# Patient Record
Sex: Female | Born: 1940 | Race: White | Marital: Single | Smoking: Never smoker
Health system: Southern US, Community
[De-identification: ages and names within clinical notes are randomized; demographics above are authoritative.]

## PROBLEM LIST (undated history)

## (undated) DIAGNOSIS — I890 Lymphedema, not elsewhere classified: Secondary | ICD-10-CM

## (undated) DIAGNOSIS — H269 Unspecified cataract: Secondary | ICD-10-CM

## (undated) DIAGNOSIS — Z803 Family history of malignant neoplasm of breast: Secondary | ICD-10-CM

## (undated) DIAGNOSIS — M1990 Unspecified osteoarthritis, unspecified site: Secondary | ICD-10-CM

## (undated) DIAGNOSIS — I10 Essential (primary) hypertension: Secondary | ICD-10-CM

## (undated) DIAGNOSIS — Z9221 Personal history of antineoplastic chemotherapy: Secondary | ICD-10-CM

## (undated) DIAGNOSIS — K219 Gastro-esophageal reflux disease without esophagitis: Secondary | ICD-10-CM

## (undated) DIAGNOSIS — K5792 Diverticulitis of intestine, part unspecified, without perforation or abscess without bleeding: Secondary | ICD-10-CM

## (undated) DIAGNOSIS — E785 Hyperlipidemia, unspecified: Secondary | ICD-10-CM

## (undated) DIAGNOSIS — C50919 Malignant neoplasm of unspecified site of unspecified female breast: Secondary | ICD-10-CM

## (undated) DIAGNOSIS — R7303 Prediabetes: Secondary | ICD-10-CM

## (undated) DIAGNOSIS — Z923 Personal history of irradiation: Secondary | ICD-10-CM

## (undated) HISTORY — PX: SPINE SURGERY: SHX786

## (undated) HISTORY — DX: Essential (primary) hypertension: I10

## (undated) HISTORY — DX: Gastro-esophageal reflux disease without esophagitis: K21.9

## (undated) HISTORY — PX: JOINT REPLACEMENT: SHX530

## (undated) HISTORY — PX: CHOLECYSTECTOMY: SHX55

## (undated) HISTORY — DX: Diverticulitis of intestine, part unspecified, without perforation or abscess without bleeding: K57.92

## (undated) HISTORY — DX: Hyperlipidemia, unspecified: E78.5

## (undated) HISTORY — PX: MASTECTOMY: SHX3

## (undated) HISTORY — DX: Family history of malignant neoplasm of breast: Z80.3

## (undated) HISTORY — DX: Unspecified osteoarthritis, unspecified site: M19.90

## (undated) HISTORY — DX: Unspecified cataract: H26.9

## (undated) HISTORY — DX: Malignant neoplasm of unspecified site of unspecified female breast: C50.919

## (undated) HISTORY — PX: OTHER SURGICAL HISTORY: SHX169

---

## 1995-04-25 HISTORY — PX: GALLBLADDER SURGERY: SHX652

## 2010-04-19 ENCOUNTER — Ambulatory Visit: Payer: Self-pay | Admitting: Specialist

## 2010-05-05 ENCOUNTER — Inpatient Hospital Stay: Payer: Self-pay | Admitting: Specialist

## 2010-05-05 HISTORY — PX: TOTAL KNEE ARTHROPLASTY: SHX125

## 2010-05-11 ENCOUNTER — Encounter: Payer: Self-pay | Admitting: Internal Medicine

## 2010-05-25 ENCOUNTER — Encounter: Payer: Self-pay | Admitting: Internal Medicine

## 2011-05-02 ENCOUNTER — Ambulatory Visit: Payer: Self-pay | Admitting: Family Medicine

## 2011-11-17 ENCOUNTER — Ambulatory Visit: Payer: Self-pay | Admitting: Family Medicine

## 2012-04-12 ENCOUNTER — Ambulatory Visit: Payer: Self-pay | Admitting: Family Medicine

## 2012-06-21 ENCOUNTER — Ambulatory Visit: Payer: Self-pay | Admitting: Internal Medicine

## 2012-09-26 ENCOUNTER — Ambulatory Visit: Payer: Self-pay | Admitting: Orthopedic Surgery

## 2012-10-21 ENCOUNTER — Ambulatory Visit: Payer: Self-pay | Admitting: Orthopedic Surgery

## 2012-10-21 DIAGNOSIS — I10 Essential (primary) hypertension: Secondary | ICD-10-CM

## 2012-10-21 LAB — BASIC METABOLIC PANEL
Anion Gap: 5 — ABNORMAL LOW (ref 7–16)
BUN: 7 mg/dL (ref 7–18)
Co2: 31 mmol/L (ref 21–32)
Creatinine: 0.63 mg/dL (ref 0.60–1.30)
EGFR (Non-African Amer.): >60
Osmolality: 281 (ref 275–301)
Potassium: 4 mmol/L (ref 3.5–5.1)
Sodium: 142 mmol/L (ref 136–145)

## 2012-10-21 LAB — URINALYSIS, COMPLETE
Hyaline Cast: 8
Nitrite: NEGATIVE
Protein: NEGATIVE
RBC,UR: 1 /HPF (ref 0–5)
Squamous Epithelial: 12

## 2012-10-21 LAB — CBC
HCT: 41.2 % (ref 35.0–47.0)
HGB: 14 g/dL (ref 12.0–16.0)
MCHC: 33.9 g/dL (ref 32.0–36.0)
MCV: 87 fL (ref 80–100)
Platelet: 257 10*3/uL (ref 150–440)
RBC: 4.76 10*6/uL (ref 3.80–5.20)
RDW: 14.9 % — ABNORMAL HIGH (ref 11.5–14.5)
WBC: 7.9 10*3/uL (ref 3.6–11.0)

## 2012-10-21 LAB — PROTIME-INR
INR: 1
Prothrombin Time: 12.9 secs (ref 11.5–14.7)

## 2012-10-21 LAB — MRSA PCR SCREENING

## 2012-11-05 ENCOUNTER — Inpatient Hospital Stay: Payer: Self-pay | Admitting: Orthopedic Surgery

## 2012-11-06 LAB — BASIC METABOLIC PANEL
Anion Gap: 4 — ABNORMAL LOW (ref 7–16)
BUN: 3 mg/dL — ABNORMAL LOW (ref 7–18)
Co2: 30 mmol/L (ref 21–32)
EGFR (African American): >60
EGFR (Non-African Amer.): >60
Glucose: 121 mg/dL — ABNORMAL HIGH (ref 65–99)
Potassium: 3.1 mmol/L — ABNORMAL LOW (ref 3.5–5.1)
Sodium: 138 mmol/L (ref 136–145)

## 2012-11-06 LAB — PLATELET COUNT: Platelet: 208 10*3/uL (ref 150–440)

## 2012-11-07 LAB — BASIC METABOLIC PANEL
Anion Gap: 6 — ABNORMAL LOW (ref 7–16)
Calcium, Total: 8.5 mg/dL (ref 8.5–10.1)
Co2: 29 mmol/L (ref 21–32)
Creatinine: 0.58 mg/dL — ABNORMAL LOW (ref 0.60–1.30)
EGFR (African American): >60
EGFR (Non-African Amer.): >60
Glucose: 102 mg/dL — ABNORMAL HIGH (ref 65–99)
Osmolality: 276 (ref 275–301)

## 2012-11-07 LAB — PATHOLOGY REPORT

## 2012-11-08 ENCOUNTER — Encounter: Payer: Self-pay | Admitting: Internal Medicine

## 2012-11-08 LAB — BASIC METABOLIC PANEL
Anion Gap: 7 (ref 7–16)
Chloride: 103 mmol/L (ref 98–107)
Creatinine: 0.6 mg/dL (ref 0.60–1.30)
EGFR (Non-African Amer.): >60
Glucose: 97 mg/dL (ref 65–99)
Osmolality: 273 (ref 275–301)
Potassium: 3.3 mmol/L — ABNORMAL LOW (ref 3.5–5.1)

## 2012-11-09 LAB — URINALYSIS, COMPLETE
Bacteria: NONE SEEN
Bilirubin,UR: NEGATIVE
Glucose,UR: NEGATIVE mg/dL (ref 0–75)
Leukocyte Esterase: NEGATIVE
Nitrite: NEGATIVE
Protein: NEGATIVE
RBC,UR: <1 /HPF (ref 0–5)

## 2012-11-10 LAB — URINE CULTURE

## 2012-11-22 ENCOUNTER — Encounter: Payer: Self-pay | Admitting: Internal Medicine

## 2013-04-28 LAB — HM MAMMOGRAPHY: HM Mammogram: NORMAL

## 2013-05-29 ENCOUNTER — Ambulatory Visit: Payer: Self-pay | Admitting: Family Medicine

## 2014-05-12 DIAGNOSIS — E785 Hyperlipidemia, unspecified: Secondary | ICD-10-CM | POA: Diagnosis not present

## 2014-05-12 DIAGNOSIS — R3915 Urgency of urination: Secondary | ICD-10-CM | POA: Diagnosis not present

## 2014-05-12 DIAGNOSIS — K219 Gastro-esophageal reflux disease without esophagitis: Secondary | ICD-10-CM | POA: Diagnosis not present

## 2014-06-18 DIAGNOSIS — R05 Cough: Secondary | ICD-10-CM | POA: Diagnosis not present

## 2014-06-23 ENCOUNTER — Ambulatory Visit: Payer: Self-pay | Admitting: Family Medicine

## 2014-06-23 DIAGNOSIS — R05 Cough: Secondary | ICD-10-CM | POA: Diagnosis not present

## 2014-06-30 DIAGNOSIS — E785 Hyperlipidemia, unspecified: Secondary | ICD-10-CM | POA: Diagnosis not present

## 2014-06-30 DIAGNOSIS — M25511 Pain in right shoulder: Secondary | ICD-10-CM | POA: Diagnosis not present

## 2014-06-30 DIAGNOSIS — R600 Localized edema: Secondary | ICD-10-CM | POA: Diagnosis not present

## 2014-07-15 DIAGNOSIS — G8929 Other chronic pain: Secondary | ICD-10-CM | POA: Diagnosis not present

## 2014-07-15 DIAGNOSIS — M25511 Pain in right shoulder: Secondary | ICD-10-CM | POA: Diagnosis not present

## 2014-07-20 DIAGNOSIS — M1990 Unspecified osteoarthritis, unspecified site: Secondary | ICD-10-CM | POA: Diagnosis not present

## 2014-08-14 NOTE — Discharge Summary (Signed)
PATIENT NAME:  Theresa Reid, ASCH MR#:  [id number] DATE OF BIRTH:  1940/06/19  DATE OF ADMISSION:  11/05/2012 DATE OF DISCHARGE:    ADMITTING DIAGNOSIS:  Left knee severe osteoarthritis.  DISCHARGE DIAGNOSIS:  Severe left knee osteoarthritis.   PROCEDURE:  Left total knee replacement.   SURGEON:  Laurene Footman, M.D.   ASSISTANT:  Rachelle Hora, PA-C.   ANESTHESIA:  Spinal.   TOURNIQUET TIME:  90 minutes at 300 mmHg.   ESTIMATED BLOOD LOSS:  100 mL.    IMPLANTS:  Medacta GMK primary total knee system, three narrow left femoral components, PS, a left fixed tibial component, a size 2, and a size 2, 17 mm PS tibial insert and a size 2 patella.  All components were cemented with a gentamicin simplex screw.   HISTORY OF PRESENT ILLNESS:  The patient is a 74 year old female referred by Sammuel Bailiff, M.D.  She has significant osteoarthritis of the left knee.  She has had nonoperative treatment that has failed.  She has pain at rest as well as pain with activity.  She comes back after [facility] to discuss total knee replacement.   PHYSICAL EXAMINATION: LUNGS:  Clear.  HEART:  Regular rate and rhythm.  HEENT:  Normal.  MUSCULOSKELETAL:  On exam she has 25 to 100 degrees range of motion with slight deformity that is passively correctable.  Distally she has trace dorsalis pedis and posterior tibialis pulses.  Sensation and toe and ankle range of motion is intact.    HOSPITAL COURSE:  The patient was admitted to the hospital on 11/05/2012.  She had surgery that same day and was brought to the orthopedic floor from the PACU in stable condition.  On hospital day 1, the patient was found to have a postop blood loss anemia and hypokalemia of 3.1.  The patient was given Klor-Con 20 mEq tablet 1 tab by mouth twice daily for the next 48 hours.  Her vital signs were stable.  She had slow progress with physical therapy.  On postop day 2, the patient's potassium was up to 3.3.  We continued with Klor-Con  tablets.  On postop day 3, the patient continued to have slow progress with physical therapy.  Her potassium level was stable.  Vital signs were stable.  She was medically stable and ready for discharge to rehab facility.   CONDITION AT DISCHARGE:  Stable.   DISCHARGE INSTRUCTIONS:  The patient may gradually increase weight-bearing on the affected extremity.  She can resume a regular diet as tolerated.  She needs to continue with the polar care unit between 40 and 50 degrees.  Do not get the dressing or bandage wet or dirty.  Call [facility] if the dressing gets water under it, leave the dressing off.  Call [facility] if either of the following occur, bright red bleeding from the incision or wound, fever above 101.5 degrees, redness, swelling or drainage at the incision.  Call [facility] if you experience any increased worsening numbness or weakness in your legs or bowel or bladder symptoms.  Physical therapy has been set up for a rehab facility.  She needs to contact [facility] in two weeks for an appointment.   DISCHARGE MEDICATIONS:  Amlodipine 5 mg delayed tablet one tablet orally once a day, fish orally 1,000 mg oral capsule one capsule orally once a day, misoprostol 1,000 mcg tablet 1 tablet orally 2 times a day, omeprazole 20 mg 1 tablet orally once a day, torsemide  5 mg 1 tablet orally once a day, potassium chloride 10 mEq 1 capsule orally once a day extended release, calcium and vitamin D one tablet orally once a day, simvastatin 40 mg 1 tablet orally once a day, tramadol 50 mg on 1 tablet orally every 6 hours as needed for pain, oxybutynin 5 mg 1 tablet orally 3 times a day, Tylenol 500 mg 1 tablet orally every 4 hours as needed for pain or temperature greater than 100.4, oxycodone 5 mg 1 or 2 tablets orally every 4 hours as needed for pain,  magnesium hydroxide 8% oral suspension 30 mL orally x2 as needed for constipation, Mylanta 30 mL orally every 6 hours as needed for indigestion or heartburn, Xarelto 10  mg oral tablet 1 tablet orally once a day in the morning x 9 days, aspirin 81 mg oral delayed release tablet 1 tablet orally once a day, diphenhydramine 50 mg 1 tablet orally once a day, bisacodyl 10 mg rectal suppository 1 suppository rectal once a day as needed for constipation.    ____________________________ T. Rachelle Hora, PA-C tcg:ea D: 11/07/2012 21:45:00 ET T: 11/07/2012 22:58:52 ET JOB#: [id number]  cc: T. Rachelle Hora, PA-C, <Dictator> Duanne Guess [place] ELECTRONICALLY SIGNED 11/25/2012 12:29

## 2014-08-14 NOTE — H&P (Signed)
PATIENT NAME:  Theresa Reid, Theresa Reid MR#:  [id number] DATE OF BIRTH:  05/13/1940  DATE OF ADMISSION:  11/05/2012  CHIEF COMPLAINT: Left knee pain.   HISTORY OF PRESENT ILLNESS: The patient is a 74 year old patient, who has significant knee osteoarthritis. She has been followed by Dr. Sammuel Bailiff and has exhausted nonoperative treatments, including corticosteroid injection, therapy, bracing and injections as well as maximizing medical treatment with anti-inflammatory and pain pills. She has had significant pain at rest and pain with any activity, and she is now being admitted for elective left total knee replacement.   MEDICATIONS:  Oxybutynin 5 mg p.o. t.i.d., torsemide 5 mg daily, amlodipine 5 mg daily, omeprazole 20 mg daily, diclofenac 75 b.i.d. with food as needed for pain, atorvastatin 40 mg at night, KCl 10 mEq daily, vitamin D 50,000 units weekly, fluticasone spray 50 mcg 2 sprays to each nostril daily, misoprostol 100 mcg b.i.d., fish oil 1000 mg daily, 81 mg aspirin daily, tramadol 50 mg 1 p.o. q.4 to 6 hours as needed for pain, multivitamin daily.   ALLERGIES:  No known drug allergies.   PAST MEDICAL HISTORY:  Remarkable for chickenpox, hypertension, arthritis, high cholesterol.   PAST SURGICAL HISTORY:  Include right total knee replacement 2012, cholecystectomy 1997.   SOCIAL HISTORY: The patient is a nondrinker, nonsmoker, divorced, retired.   FAMILY HISTORY: Positive for diabetes in her father.  REVIEW OF SYSTEMS: Negative for shortness of breath, chest or other significant complaints.   PHYSICAL EXAMINATION:  GENERAL:  Height 5 feet 4 inches, weight 208. HEENT: Normal except for the patient wearing glasses.  LUNGS: Clear.  HEART: Regular rate and rhythm.  EXTREMITIES:  Left lower extremity, she has trace dorsalis pedis and posterior tib pulses. Sensation and range of motion is normal. Left knee range of motion is 25 to 100 degrees with varus deformity that is passively  correctable.   X-rays show severe tricompartmental osteoarthritis.   CLINICAL IMPRESSION: Severe left knee osteoarthritis.   PLAN: Total knee replacement today.  Risks, benefits and possible complications have previously been discussed.     ____________________________ Laurene Footman, MD mjm:dp D: 11/05/2012 10:19:00 ET T: 11/05/2012 11:57:56 ET JOB#: [id number]  cc: Laurene Footman, MD, <Dictator> Laurene Footman MD ELECTRONICALLY SIGNED 11/05/2012 14:21

## 2014-08-14 NOTE — Op Note (Signed)
PATIENT NAME:  Theresa Reid, Theresa Reid MR#:  [id number] DATE OF BIRTH:  January 20, 1941  DATE OF PROCEDURE:  11/05/2012  PREOPERATIVE DIAGNOSIS: Severe left knee osteoarthritis.   POSTOPERATIVE DIAGNOSIS: Severe left knee osteoarthritis.   PROCEDURE: Left total knee replacement.   SURGEON: Laurene Footman, M.D.   ASSISTANT: Rachelle Hora, PA-C.   ANESTHESIA: Spinal.   DESCRIPTION OF PROCEDURE: The patient was brought to the operating room and after adequate anesthesia was obtained, the left leg was prepped and draped in the usual sterile fashion with a tourniquet applied to the upper thigh and the Alvarado leg holder utilized. After appropriate patient identification and timeout procedures were completed, the leg was exsanguinated with an Esmarch and the tourniquet raised to 300 mmHg. A midline skin incision was made after patient identification, timeout procedures, followed by a medial parapatellar arthrotomy. The knee showed extensive wear in all compartments, particularly laterally with bone loss in both the femoral condyles and severe patellofemoral arthritis. The ACL and fat pad were excised. The proximal tibia was exposed to allow for the application of the Woods cutting guide. The cutting guide was applied and the proximal tibia cut carried out excising the PCL at the same time. Next, I had the Medacta knee block applied after removing cartilage in the appropriate areas and the distal femoral cut carried out with a 3 cutting block applied. Anterior, posterior, and chamfer cuts were made releasing soft tissues posteriorly, because of severe flexion contracture. Trials were placed with a 2 tibia and 3 femur. The lateral side was quite tight and partial release of the lateral collateral ligament was carried out to allow for symmetry on the lateral side to compare to the medial side. With a 17 insert, there was good stability and still full extension. The trochlear cut and drill holes were made in the distal  femur along with the PS drill for the Medacta PS insert. The patella was cut using the guide and measured a size 2 with 3 drill holes made. At this point, the tourniquet was let down. After all trials had been removed and hemostasis checked with electrocautery, the knee was then infiltrated with Exparel around the joint along with a 0.5% Sensorcaine to aid in postoperative analgesia immediately. Tourniquet was raised again and the knee again irrigated. Bony surfaces dried. The tibial component was cemented in place first, followed by the 17 mm PS tibial component, which also had a set screw, which was placed. The femoral component was then impacted into place with the knee held in extension while the patellar button was clamped into place with antibiotic cement being utilized. After the cement had set and excess cement had been removed, the knee was thoroughly irrigated. The arthrotomy was repaired using #1 Ethibond and a heavy quill, 2-0 quill subcutaneously and skin staples. A wound VAC was applied. Range of motion was 0 to 110 degrees with good stability.   ESTIMATED BLOOD LOSS: 100 mL.  TOURNIQUET TIME: 90 minutes at 300 mmHg.  IMPLANTS: Medacta GMK primary total knee system, 3 narrow left femoral components PS, a left fixed tibial component, size 2,  a size 2, 17 mm PS tibial insert and a size 2 patella. All components were cemented with gentamicin simplex.   SPECIMENS: Cut ends of bone.   COMPLICATIONS: None.   ____________________________ Laurene Footman, MD mjm:aw D: 11/05/2012 13:28:03 ET T: 11/05/2012 14:15:12 ET JOB#: [id number]  cc: Laurene Footman, MD, <Dictator> Laurene Footman MD ELECTRONICALLY SIGNED 11/05/2012 17:48

## 2014-08-20 DIAGNOSIS — Z1389 Encounter for screening for other disorder: Secondary | ICD-10-CM | POA: Diagnosis not present

## 2014-08-20 DIAGNOSIS — Z0000 Encounter for general adult medical examination without abnormal findings: Secondary | ICD-10-CM | POA: Diagnosis not present

## 2014-08-20 DIAGNOSIS — Z9181 History of falling: Secondary | ICD-10-CM | POA: Diagnosis not present

## 2014-08-20 DIAGNOSIS — Z780 Asymptomatic menopausal state: Secondary | ICD-10-CM | POA: Diagnosis not present

## 2014-09-09 DIAGNOSIS — M1990 Unspecified osteoarthritis, unspecified site: Secondary | ICD-10-CM | POA: Diagnosis not present

## 2014-09-09 DIAGNOSIS — I10 Essential (primary) hypertension: Secondary | ICD-10-CM | POA: Diagnosis not present

## 2014-11-09 ENCOUNTER — Ambulatory Visit: Payer: Medicare Other | Admitting: Family Medicine

## 2014-11-10 ENCOUNTER — Ambulatory Visit: Payer: Medicare Other | Admitting: Family Medicine

## 2014-11-13 ENCOUNTER — Encounter: Payer: Self-pay | Admitting: Family Medicine

## 2014-11-13 ENCOUNTER — Ambulatory Visit (INDEPENDENT_AMBULATORY_CARE_PROVIDER_SITE_OTHER): Payer: Medicare Other | Admitting: Family Medicine

## 2014-11-13 VITALS — BP 138/74 | HR 106 | Temp 98.2°F | Resp 18 | Ht 65.0 in | Wt 205.1 lb

## 2014-11-13 DIAGNOSIS — M159 Polyosteoarthritis, unspecified: Secondary | ICD-10-CM | POA: Insufficient documentation

## 2014-11-13 DIAGNOSIS — I10 Essential (primary) hypertension: Secondary | ICD-10-CM | POA: Insufficient documentation

## 2014-11-13 DIAGNOSIS — M150 Primary generalized (osteo)arthritis: Secondary | ICD-10-CM

## 2014-11-13 DIAGNOSIS — R600 Localized edema: Secondary | ICD-10-CM

## 2014-11-13 DIAGNOSIS — E785 Hyperlipidemia, unspecified: Secondary | ICD-10-CM | POA: Diagnosis not present

## 2014-11-13 DIAGNOSIS — T502X5A Adverse effect of carbonic-anhydrase inhibitors, benzothiadiazides and other diuretics, initial encounter: Secondary | ICD-10-CM

## 2014-11-13 DIAGNOSIS — E876 Hypokalemia: Secondary | ICD-10-CM

## 2014-11-13 MED ORDER — POTASSIUM CHLORIDE ER 10 MEQ PO TBCR
10.0000 meq | EXTENDED_RELEASE_TABLET | Freq: Every day | ORAL | Status: DC
Start: 2014-11-13 — End: 2015-02-09

## 2014-11-13 MED ORDER — TORSEMIDE 5 MG PO TABS: 5.0000 mg | ORAL_TABLET | Freq: Every day | ORAL | Status: DC

## 2014-11-13 MED ORDER — ATORVASTATIN CALCIUM 40 MG PO TABS: 40.0000 mg | ORAL_TABLET | Freq: Every day | ORAL | Status: DC

## 2014-11-13 MED ORDER — TRAMADOL HCL 50 MG PO TABS: 50.0000 mg | ORAL_TABLET | Freq: Every day | ORAL | Status: DC | PRN

## 2014-11-13 MED ORDER — TRAMADOL HCL 50 MG PO TABS: 50.0000 mg | ORAL_TABLET | Freq: Four times a day (QID) | ORAL | Status: DC | PRN

## 2014-11-13 NOTE — Progress Notes (Signed)
Name: Theresa Reid   MRN: [id number]    DOB: [birth year]   Date:11/13/2014       Progress Note  Subjective  Chief Complaint  Chief Complaint  Patient presents with  . Follow-up    3 mo. f/u Labs-Fasting  . Medication Refill  . Arthritis  . Hypertension  . Hyperlipidemia    Arthritis Presents for follow-up visit. The disease course has been stable. The condition has lasted for 35 years. She complains of pain, stiffness and joint swelling. Affected locations include the right shoulder, left shoulder, left knee, right knee, left hip and right hip. Her pain is at a severity of 2/10. Associated symptoms include fatigue and pain at night. Pertinent negatives include no dry eyes, dry mouth or fever. Her past medical history is significant for chronic back pain and osteoarthritis. Past treatments include an opioid, NSAIDs, corticosteroids, exercise, activity, heat and cold. The treatment provided moderate relief. Compliance with prior treatments has been good.  Hypertension This is a chronic problem. The problem is controlled. Pertinent negatives include no chest pain, headaches, palpitations or shortness of breath. Past treatments include calcium channel blockers. The current treatment provides significant improvement. There are no compliance problems.  There is no history of angina, CAD/MI or CVA.  Hyperlipidemia This is a chronic problem. The problem is controlled. Recent lipid tests were reviewed and are high. She has no history of diabetes or liver disease. Pertinent negatives include no chest pain, leg pain, myalgias or shortness of breath. Current antihyperlipidemic treatment includes statins. The current treatment provides moderate improvement of lipids. There are no compliance problems.  Risk factors for coronary artery disease include dyslipidemia.      Past Medical History  Diagnosis Date  . Hyperlipidemia   . Hypertension   . GERD (gastroesophageal reflux disease)     Past  Surgical History  Procedure Laterality Date  . Total knee arthroplasty Bilateral 05/05/2010  . Oophorectomy Right   . Cholecystectomy      History reviewed. No pertinent family history.  History   Social History  . Marital Status: Single    Spouse Name: N/A  . Number of Children: N/A  . Years of Education: N/A   Occupational History  . Not on file.   Social History Main Topics  . Smoking status: Never Smoker   . Smokeless tobacco: Never Used  . Alcohol Use: No  . Drug Use: No  . Sexual Activity: Not on file   Other Topics Concern  . Not on file   Social History Narrative  . No narrative on file     Current outpatient prescriptions:  .  amLODipine (NORVASC) 5 MG tablet, , Disp: , Rfl:  .  aspirin 81 MG tablet, Take by mouth., Disp: , Rfl:  .  atorvastatin (LIPITOR) 40 MG tablet, Take by mouth., Disp: , Rfl:  .  CALCIUM-VITAMIN D PO, Take by mouth., Disp: , Rfl:  .  diclofenac (VOLTAREN) 75 MG EC tablet, , Disp: , Rfl:  .  misoprostol (CYTOTEC) 200 MCG tablet, , Disp: , Rfl:  .  Multiple Vitamins tablet, Take by mouth., Disp: , Rfl:  .  Omega-3 Fatty Acids (FISH OIL PO), , Disp: , Rfl:  .  omeprazole (PRILOSEC) 20 MG capsule, Take by mouth., Disp: , Rfl:  .  oxybutynin (DITROPAN) 5 MG tablet, , Disp: , Rfl:  .  potassium chloride (K-DUR) 10 MEQ tablet, Take by mouth., Disp: , Rfl:  .  torsemide (DEMADEX) 5 MG tablet,  Take by mouth., Disp: , Rfl:  .  traMADol (ULTRAM) 50 MG tablet, Take 50 mg by mouth every 6 (six) hours as needed., Disp: , Rfl:   No Known Allergies   Review of Systems  Constitutional: Positive for fatigue. Negative for fever and chills.  Respiratory: Negative for shortness of breath.   Cardiovascular: Negative for chest pain and palpitations.  Gastrointestinal: Negative for heartburn and nausea.  Musculoskeletal: Positive for back pain, joint pain, joint swelling, arthritis and stiffness. Negative for myalgias.  Neurological: Negative for  headaches.      Objective  Filed Vitals:   11/13/14 0843  BP: 138/74  Pulse: 106  Temp: 98.2 F (36.8 C)  TempSrc: Oral  Resp: 18  Height: 5\' 5"  (1.651 m)  Weight: 205 lb 1.6 oz (93.033 kg)  SpO2: 95%    Physical Exam  Constitutional: She is oriented to person, place, and time and well-developed, well-nourished, and in no distress.  HENT:  Head: Normocephalic and atraumatic.  Cardiovascular: Normal rate and regular rhythm.   Pulmonary/Chest: Effort normal and breath sounds normal.  Musculoskeletal:       Back:       Arms: Neurological: She is alert and oriented to person, place, and time.  Nursing note and vitals reviewed.   Assessment & Plan 1. Essential hypertension Blood Pressure is at goal. Continue present therapy - Comprehensive metabolic panel  2. Hyperlipidemia  - atorvastatin (LIPITOR) 40 MG tablet; Take 1 tablet (40 mg total) by mouth daily at 6 PM.  Dispense: 90 tablet; Refill: 0 - Comprehensive metabolic panel - Lipid panel  3. Primary osteoarthritis involving multiple joints Patient takes diclofenac twice a day and tramadol for osteoarthritis involving multiple joints. Prescription for tramadol refilled. - traMADol (ULTRAM) 50 MG tablet; Take 1 tablet (50 mg total) by mouth daily as needed.  Dispense: 30 tablet; Refill: 2  4. Bilateral leg edema  - torsemide (DEMADEX) 5 MG tablet; Take 1 tablet (5 mg total) by mouth daily.  Dispense: 90 tablet; Refill: 0  5. Diuretic-induced hypokalemia  - potassium chloride (K-DUR) 10 MEQ tablet; Take 1 tablet (10 mEq total) by mouth daily.  Dispense: 90 tablet; Refill: 0 - Comprehensive metabolic panel   Nannette Zill Asad A. [facility] 11/13/2014 9:05 AM

## 2014-11-14 LAB — COMPREHENSIVE METABOLIC PANEL
ALT: 21 IU/L (ref 0–32)
AST: 23 IU/L (ref 0–40)
Albumin/Globulin Ratio: 1.5 (ref 1.1–2.5)
Albumin: 4.2 g/dL (ref 3.5–4.8)
Alkaline Phosphatase: 134 IU/L — ABNORMAL HIGH (ref 39–117)
BUN / CREAT RATIO: 13 (ref 11–26)
BUN: 7 mg/dL — ABNORMAL LOW (ref 8–27)
Bilirubin Total: 0.4 mg/dL (ref 0.0–1.2)
CALCIUM: 9.6 mg/dL (ref 8.7–10.3)
CHLORIDE: 100 mmol/L (ref 97–108)
CO2: 25 mmol/L (ref 18–29)
CREATININE: 0.55 mg/dL — AB (ref 0.57–1.00)
GFR calc Af Amer: 106 mL/min/{1.73_m2} (ref >59)
GFR calc non Af Amer: 92 mL/min/{1.73_m2} (ref >59)
GLOBULIN, TOTAL: 2.8 g/dL (ref 1.5–4.5)
Glucose: 116 mg/dL — ABNORMAL HIGH (ref 65–99)
POTASSIUM: 4.8 mmol/L (ref 3.5–5.2)
Sodium: 142 mmol/L (ref 134–144)
Total Protein: 7 g/dL (ref 6.0–8.5)

## 2014-11-14 LAB — LIPID PANEL
CHOLESTEROL TOTAL: 204 mg/dL — AB (ref 100–199)
Chol/HDL Ratio: 3.6 ratio units (ref 0.0–4.4)
HDL: 56 mg/dL (ref >39)
LDL CALC: 106 mg/dL — AB (ref 0–99)
TRIGLYCERIDES: 212 mg/dL — AB (ref 0–149)
VLDL CHOLESTEROL CAL: 42 mg/dL — AB (ref 5–40)

## 2014-11-19 ENCOUNTER — Ambulatory Visit: Payer: Self-pay | Admitting: Family Medicine

## 2014-11-20 ENCOUNTER — Ambulatory Visit: Payer: Self-pay | Admitting: Family Medicine

## 2014-11-20 ENCOUNTER — Ambulatory Visit: Payer: Self-pay | Admitting: Nurse Practitioner

## 2014-11-30 ENCOUNTER — Ambulatory Visit (INDEPENDENT_AMBULATORY_CARE_PROVIDER_SITE_OTHER): Payer: Medicare Other | Admitting: Nurse Practitioner

## 2014-11-30 ENCOUNTER — Encounter: Payer: Self-pay | Admitting: Nurse Practitioner

## 2014-11-30 VITALS — BP 125/82 | HR 75 | Temp 98.8°F | Resp 14 | Ht 65.0 in | Wt 206.0 lb

## 2014-11-30 DIAGNOSIS — M150 Primary generalized (osteo)arthritis: Secondary | ICD-10-CM | POA: Diagnosis not present

## 2014-11-30 DIAGNOSIS — R600 Localized edema: Secondary | ICD-10-CM | POA: Diagnosis not present

## 2014-11-30 DIAGNOSIS — I10 Essential (primary) hypertension: Secondary | ICD-10-CM

## 2014-11-30 DIAGNOSIS — E785 Hyperlipidemia, unspecified: Secondary | ICD-10-CM

## 2014-11-30 DIAGNOSIS — M159 Polyosteoarthritis, unspecified: Secondary | ICD-10-CM

## 2014-11-30 NOTE — Assessment & Plan Note (Signed)
Still apparent at ankles and LE today. Pt reports this happened after knee surgeries. L>R. Discussed elevating legs and using compression stockings.

## 2014-11-30 NOTE — Assessment & Plan Note (Signed)
Will obtain updated lipid panel in Oct. Discussing increasing walking and working on a healthy diet. Discussed cutting down on rice, noodles ect.Marland KitchenMarland Kitchen

## 2014-11-30 NOTE — Assessment & Plan Note (Signed)
Pt wants referral to Rheumatology. Dr. Ernestene Mention specifically. Referred and let pt know it may take a few weeks before we call her. Continue pain management with medications and stretching. Will follow.

## 2014-11-30 NOTE — Patient Instructions (Addendum)
We will obtain your lipid panel in October. Please come fasting (nothing to eat or drink after midnight except water and black coffee).   Welcome to [facility]! Nice to meet you.

## 2014-11-30 NOTE — Progress Notes (Signed)
Patient ID: Theresa Reid, female    DOB: 1940/05/12  Age: 74 y.o. MRN: [id number]  CC: Establish Care   HPI COOPER STAMP presents for establishing care and CC of myalgia x 2 weeks. [facility]   1) New pt info:   Immunizations- Unknown tdap, pna 2015   Mammogram- 2015   Pap- N/A (last 2010 she reports)   Bone Density- 5 years ago in [place]   Colonoscopy- IFOBTs through insurance negative each year she reports, she refuses colonoscopy   Eye Exam- 2014, glasses, will obtain one this year  Dental Exam- UTD  2) Chronic Problems-  HTN/Hyperlipidemia- On medications, stable on these and compliant   Obesity- Pt reports watching meat, reports eating lots of fruits/veggies, reports she eats junk food    Exercise- no formal   OA- 40 years, hands/hips/lower back/feet/shoulders   Right shoulder pain- Saw Dr. Jefm Bryant in March    X-ray taken, right shoulder injected   Has seen Dr. Tamala Julian and Dr. Rudene Christians in the past    Bilateral knee replacement 2012/2014    Tramadol prescribed 11/20/14 by Dr. Manuella Ghazi with    2 refills according to the [facility] (Tramadol is helpful) takes 2-3 x a week at night  3) Acute Problems-  Myalgias- March started on right from, stinging and burning anterior to her antecubital space, R>L PT- helpful for shoulders in the past    History Kadynce has a past medical history of Hyperlipidemia; Hypertension; GERD (gastroesophageal reflux disease); and Arthritis.   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   Her family history includes Alcohol abuse in her mother; Diabetes in her father; Heart disease in her father.She reports that she has never smoked. She has never used smokeless tobacco. She reports that she does not drink alcohol or use illicit drugs.  Outpatient Prescriptions Prior to Visit  Medication Sig Dispense Refill  .  amLODipine (NORVASC) 5 MG tablet     . aspirin 81 MG tablet Take by mouth.    Marland Kitchen atorvastatin (LIPITOR) 40 MG tablet Take 1 tablet (40 mg total) by mouth daily at 6 PM. 90 tablet 0  . CALCIUM-VITAMIN D PO Take by mouth.    . diclofenac (VOLTAREN) 75 MG EC tablet     . misoprostol (CYTOTEC) 200 MCG tablet     . Multiple Vitamins tablet Take by mouth.    . Omega-3 Fatty Acids (FISH OIL PO)     . omeprazole (PRILOSEC) 20 MG capsule Take by mouth.    . oxybutynin (DITROPAN) 5 MG tablet     . potassium chloride (K-DUR) 10 MEQ tablet Take 1 tablet (10 mEq total) by mouth daily. 90 tablet 0  . torsemide (DEMADEX) 5 MG tablet Take 1 tablet (5 mg total) by mouth daily. 90 tablet 0  . traMADol (ULTRAM) 50 MG tablet Take 1 tablet (50 mg total) by mouth daily as needed. 30 tablet 2   No facility-administered medications prior to visit.    ROS Review of Systems  Constitutional: Negative for fever, chills, diaphoresis and fatigue.  Respiratory: Negative for chest tightness, shortness of breath and wheezing.   Cardiovascular: Negative for chest pain, palpitations and leg swelling.  Gastrointestinal: Negative for nausea, vomiting and diarrhea.  Musculoskeletal: Positive for myalgias and arthralgias.  Skin: Negative for rash.  Neurological: Negative for dizziness, weakness, numbness and headaches.  Hematological: Bruises/bleeds easily.  Psychiatric/Behavioral:  Positive for sleep disturbance. Negative for suicidal ideas. The patient is not nervous/anxious.        Trouble falling asleep and staying asleep due to OA pain   Objective:  BP 125/82 mmHg  Pulse 75  Temp(Src) 98.8 F (37.1 C)  Resp 14  Ht 5\' 5"  (1.651 m)  Wt 206 lb (93.441 kg)  BMI 34.28 kg/m2  SpO2 95%  Physical Exam  Constitutional: She is oriented to person, place, and time. She appears well-developed and well-nourished. No distress.  HENT:  Head: Normocephalic and atraumatic.  Right Ear: External ear normal.  Left Ear:  External ear normal.  Cardiovascular: Normal rate, regular rhythm and normal heart sounds.  Exam reveals no gallop and no friction rub.   No murmur heard. Pulmonary/Chest: Effort normal and breath sounds normal. No respiratory distress. She has no wheezes. She has no rales. She exhibits no tenderness.  Musculoskeletal: She exhibits edema and tenderness.  5/5 strength grip bilaterally, deltoids, biceps, illiopsoas, quadriceps and EHL. Decreased ROM in shoulders due to pain. Normal ROM of hips, knees, ankles, wrists.   Neurological: She is alert and oriented to person, place, and time. No cranial nerve deficit. She exhibits normal muscle tone. Coordination normal.  Skin: Skin is warm and dry. No rash noted. She is not diaphoretic.  Psychiatric: She has a normal mood and affect. Her behavior is normal. Judgment and thought content normal.   Assessment & Plan:   Kennia was seen today for establish care.  Diagnoses and all orders for this visit:  Primary osteoarthritis involving multiple joints Orders: -     Ambulatory referral to Rheumatology  Bilateral leg edema  Essential hypertension  Hyperlipidemia   I am having Ms. Kowalczyk maintain her oxybutynin, misoprostol, diclofenac, amLODipine, omeprazole, Multiple Vitamins, Omega-3 Fatty Acids (FISH OIL PO), aspirin, CALCIUM-VITAMIN D PO, atorvastatin, torsemide, potassium chloride, and traMADol.  No orders of the defined types were placed in this encounter.    Follow-up: Return in about 3 months (around 02/15/2015) for HTN/HLD check .

## 2014-11-30 NOTE — Assessment & Plan Note (Signed)
Compliant on medications. Will follow.

## 2014-11-30 NOTE — Progress Notes (Signed)
Pre visit review using our clinic review tool, if applicable. No additional management support is needed unless otherwise documented below in the visit note. 

## 2014-12-14 ENCOUNTER — Ambulatory Visit (INDEPENDENT_AMBULATORY_CARE_PROVIDER_SITE_OTHER): Payer: Medicare Other | Admitting: Nurse Practitioner

## 2014-12-14 VITALS — BP 132/82 | HR 84 | Temp 98.3°F | Resp 14 | Ht 65.0 in | Wt 205.6 lb

## 2014-12-14 DIAGNOSIS — M79604 Pain in right leg: Secondary | ICD-10-CM

## 2014-12-14 MED ORDER — OXYBUTYNIN CHLORIDE 5 MG PO TABS: 5.0000 mg | ORAL_TABLET | Freq: Two times a day (BID) | ORAL | Status: DC

## 2014-12-14 MED ORDER — GABAPENTIN 100 MG PO CAPS: 100.0000 mg | ORAL_CAPSULE | Freq: Every day | ORAL | Status: DC

## 2014-12-14 MED ORDER — DICLOFENAC SODIUM 75 MG PO TBEC: 75.0000 mg | DELAYED_RELEASE_TABLET | Freq: Two times a day (BID) | ORAL | Status: DC

## 2014-12-14 MED ORDER — AMLODIPINE BESYLATE 5 MG PO TABS: 5.0000 mg | ORAL_TABLET | Freq: Every day | ORAL | Status: DC

## 2014-12-14 NOTE — Progress Notes (Signed)
Patient ID: Theresa Reid, female    DOB: 08-17-1940  Age: 74 y.o. MRN: [id number]  CC: Medication Refill   HPI Theresa Reid presents for medication refills and right leg pain.  1) Oxybutinin- stable Diclofenac-stable Amlodipine- stable  Week after next appointment with Ernestene Mention   Onset- 2 months  Location- right lateral calf between knee and ankle  Duration - night time  Characteristics- strong aching  Aggravating factors- unknown Relieving factors- rubbing the leg Severity- moderate  History Theresa Reid has a past medical history of Hyperlipidemia; Hypertension; GERD (gastroesophageal reflux disease); and Arthritis.   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   Her family history includes Alcohol abuse in her mother; Diabetes in her father; Heart disease in her father.She reports that she has never smoked. She has never used smokeless tobacco. She reports that she does not drink alcohol or use illicit drugs.  Outpatient Prescriptions Prior to Visit  Medication Sig Dispense Refill  . aspirin 81 MG tablet Take by mouth.    Marland Kitchen atorvastatin (LIPITOR) 40 MG tablet Take 1 tablet (40 mg total) by mouth daily at 6 PM. 90 tablet 0  . CALCIUM-VITAMIN D PO Take by mouth.    . misoprostol (CYTOTEC) 200 MCG tablet     . Multiple Vitamins tablet Take by mouth.    . Omega-3 Fatty Acids (FISH OIL PO)     . omeprazole (PRILOSEC) 20 MG capsule Take by mouth.    . potassium chloride (K-DUR) 10 MEQ tablet Take 1 tablet (10 mEq total) by mouth daily. 90 tablet 0  . torsemide (DEMADEX) 5 MG tablet Take 1 tablet (5 mg total) by mouth daily. 90 tablet 0  . traMADol (ULTRAM) 50 MG tablet Take 1 tablet (50 mg total) by mouth daily as needed. 30 tablet 2  . amLODipine (NORVASC) 5 MG tablet     . diclofenac (VOLTAREN) 75 MG EC tablet     . oxybutynin (DITROPAN) 5 MG tablet      No facility-administered  medications prior to visit.    ROS Review of Systems  Constitutional: Negative for fever, chills, diaphoresis and fatigue.  Respiratory: Negative for chest tightness, shortness of breath and wheezing.   Cardiovascular: Negative for chest pain, palpitations and leg swelling.  Gastrointestinal: Negative for nausea, vomiting and diarrhea.  Musculoskeletal: Positive for arthralgias.  Neurological: Negative for dizziness, weakness, numbness and headaches.    Objective:  BP 132/82 mmHg  Pulse 84  Temp(Src) 98.3 F (36.8 C)  Resp 14  Ht 5\' 5"  (1.651 m)  Wt 205 lb 9.6 oz (93.26 kg)  BMI 34.21 kg/m2  SpO2 91%  Physical Exam  Constitutional: She is oriented to person, place, and time. She appears well-developed and well-nourished. No distress.  HENT:  Head: Normocephalic and atraumatic.  Right Ear: External ear normal.  Left Ear: External ear normal.  Cardiovascular: Normal rate and regular rhythm.   Pulmonary/Chest: Effort normal and breath sounds normal. No respiratory distress. She has no wheezes. She has no rales. She exhibits no tenderness.  Neurological: She is alert and oriented to person, place, and time. No cranial nerve deficit. She exhibits normal muscle tone. Coordination normal.  Skin: Skin is warm and dry. No rash noted. She is not diaphoretic.  Psychiatric: She has a normal mood and affect. Her behavior is normal. Judgment and thought content normal.   Assessment & Plan:   Theresa Reid was seen today for medication refill.  Diagnoses and all orders for this visit:  Right leg pain  Other orders -     gabapentin (NEURONTIN) 100 MG capsule; Take 1 capsule (100 mg total) by mouth at bedtime. -     amLODipine (NORVASC) 5 MG tablet; Take 1 tablet (5 mg total) by mouth daily. -     diclofenac (VOLTAREN) 75 MG EC tablet; Take 1 tablet (75 mg total) by mouth 2 (two) times daily. -     oxybutynin (DITROPAN) 5 MG tablet; Take 1 tablet (5 mg total) by mouth 2 (two) times daily.  I  have changed [name]'s amLODipine, diclofenac, and oxybutynin. I am also having her start on gabapentin. Additionally, I am having her maintain her misoprostol, omeprazole, Multiple Vitamins, Omega-3 Fatty Acids (FISH OIL PO), aspirin, CALCIUM-VITAMIN D PO, atorvastatin, torsemide, potassium chloride, and traMADol.  Meds ordered this encounter  Medications  . gabapentin (NEURONTIN) 100 MG capsule    Sig: Take 1 capsule (100 mg total) by mouth at bedtime.    Dispense:  30 capsule    Refill:  0    Order Specific Question:  Supervising Provider    Answer:  Deborra Medina L [[id number]]  . amLODipine (NORVASC) 5 MG tablet    Sig: Take 1 tablet (5 mg total) by mouth daily.    Dispense:  90 tablet    Refill:  3    Order Specific Question:  Supervising Provider    Answer:  Deborra Medina L [[id number]]  . diclofenac (VOLTAREN) 75 MG EC tablet    Sig: Take 1 tablet (75 mg total) by mouth 2 (two) times daily.    Dispense:  180 tablet    Refill:  3    Order Specific Question:  Supervising Provider    Answer:  Deborra Medina L [[id number]]  . oxybutynin (DITROPAN) 5 MG tablet    Sig: Take 1 tablet (5 mg total) by mouth 2 (two) times daily.    Dispense:  180 tablet    Refill:  3    Order Specific Question:  Supervising Provider    Answer:  Crecencio Mc [[id number]]     Follow-up: Return in about 4 weeks (around 01/11/2015).

## 2014-12-14 NOTE — Progress Notes (Signed)
Pre visit review using our clinic review tool, if applicable. No additional management support is needed unless otherwise documented below in the visit note. 

## 2014-12-24 ENCOUNTER — Encounter: Payer: Self-pay | Admitting: Nurse Practitioner

## 2014-12-24 DIAGNOSIS — M79604 Pain in right leg: Secondary | ICD-10-CM | POA: Insufficient documentation

## 2014-12-24 NOTE — Assessment & Plan Note (Signed)
Uncontrolled. Will start gabapentin 100 mg at nighttime. Follow-up as needed

## 2015-02-09 ENCOUNTER — Ambulatory Visit (INDEPENDENT_AMBULATORY_CARE_PROVIDER_SITE_OTHER): Payer: Medicare Other | Admitting: Nurse Practitioner

## 2015-02-09 VITALS — BP 138/82 | HR 83 | Temp 98.0°F | Resp 14 | Ht 65.0 in | Wt 205.0 lb

## 2015-02-09 DIAGNOSIS — E876 Hypokalemia: Secondary | ICD-10-CM

## 2015-02-09 DIAGNOSIS — Z23 Encounter for immunization: Secondary | ICD-10-CM

## 2015-02-09 DIAGNOSIS — R600 Localized edema: Secondary | ICD-10-CM

## 2015-02-09 DIAGNOSIS — T502X5A Adverse effect of carbonic-anhydrase inhibitors, benzothiadiazides and other diuretics, initial encounter: Secondary | ICD-10-CM

## 2015-02-09 DIAGNOSIS — E785 Hyperlipidemia, unspecified: Secondary | ICD-10-CM

## 2015-02-09 LAB — LIPID PANEL
CHOLESTEROL: 208 mg/dL — AB (ref 0–200)
HDL: 55 mg/dL (ref >39.00)
NonHDL: 152.95
TRIGLYCERIDES: 296 mg/dL — AB (ref 0.0–149.0)
Total CHOL/HDL Ratio: 4
VLDL: 59.2 mg/dL — ABNORMAL HIGH (ref 0.0–40.0)

## 2015-02-09 LAB — BASIC METABOLIC PANEL
BUN: 9 mg/dL (ref 6–23)
CO2: 31 mEq/L (ref 19–32)
Calcium: 9.6 mg/dL (ref 8.4–10.5)
Chloride: 103 mEq/L (ref 96–112)
Creatinine, Ser: 0.57 mg/dL (ref 0.40–1.20)
GFR: 109.66 mL/min (ref >60.00)
GLUCOSE: 100 mg/dL — AB (ref 70–99)
POTASSIUM: 4.3 meq/L (ref 3.5–5.1)
SODIUM: 142 meq/L (ref 135–145)

## 2015-02-09 LAB — LDL CHOLESTEROL, DIRECT: Direct LDL: 105 mg/dL

## 2015-02-09 MED ORDER — TORSEMIDE 5 MG PO TABS: 5.0000 mg | ORAL_TABLET | Freq: Every day | ORAL | Status: DC

## 2015-02-09 MED ORDER — ATORVASTATIN CALCIUM 40 MG PO TABS: 40.0000 mg | ORAL_TABLET | Freq: Every day | ORAL | Status: DC

## 2015-02-09 MED ORDER — POTASSIUM CHLORIDE ER 10 MEQ PO TBCR: 10.0000 meq | EXTENDED_RELEASE_TABLET | Freq: Every day | ORAL | Status: DC

## 2015-02-09 NOTE — Patient Instructions (Addendum)
Please visit the lab before leaving today.  Follow up in 3 months.  

## 2015-02-09 NOTE — Progress Notes (Signed)
Patient ID: Theresa Reid, female    DOB: 1940/10/31  Age: 74 y.o. MRN: [id number]  CC: Medication Refill   HPI [name] presents for follow up and medication refills.   1) Lipid panel and BMET need to update it today.   2) Following up with Rheumatology in [facility]. Patient states that she very much likes her new rheumatologist.   3) Hyperlipidemia-   Does not eat meat, no ice cream in the winter   Exercise- No formal   History Chereese has a past medical history of Hyperlipidemia; Hypertension; GERD (gastroesophageal reflux disease); and Arthritis.   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   Her family history includes Alcohol abuse in her mother; Diabetes in her father; Heart disease in her father.She reports that she has never smoked. She has never used smokeless tobacco. She reports that she does not drink alcohol or use illicit drugs.  Outpatient Prescriptions Prior to Visit  Medication Sig Dispense Refill  . amLODipine (NORVASC) 5 MG tablet Take 1 tablet (5 mg total) by mouth daily. 90 tablet 3  . aspirin 81 MG tablet Take by mouth.    Marland Kitchen CALCIUM-VITAMIN D PO Take by mouth.    . gabapentin (NEURONTIN) 100 MG capsule Take 1 capsule (100 mg total) by mouth at bedtime. 30 capsule 0  . misoprostol (CYTOTEC) 200 MCG tablet     . Multiple Vitamins tablet Take by mouth.    . Omega-3 Fatty Acids (FISH OIL PO)     . omeprazole (PRILOSEC) 20 MG capsule Take by mouth.    . oxybutynin (DITROPAN) 5 MG tablet Take 1 tablet (5 mg total) by mouth 2 (two) times daily. 180 tablet 3  . traMADol (ULTRAM) 50 MG tablet Take 1 tablet (50 mg total) by mouth daily as needed. 30 tablet 2  . atorvastatin (LIPITOR) 40 MG tablet Take 1 tablet (40 mg total) by mouth daily at 6 PM. 90 tablet 0  . diclofenac (VOLTAREN) 75 MG EC tablet Take 1 tablet (75 mg total) by mouth 2 (two) times daily. 180 tablet 3   . potassium chloride (K-DUR) 10 MEQ tablet Take 1 tablet (10 mEq total) by mouth daily. 90 tablet 0  . torsemide (DEMADEX) 5 MG tablet Take 1 tablet (5 mg total) by mouth daily. 90 tablet 0   No facility-administered medications prior to visit.    ROS Review of Systems  Constitutional: Negative for fever, chills, diaphoresis and fatigue.  Respiratory: Negative for chest tightness, shortness of breath and wheezing.   Cardiovascular: Negative for chest pain, palpitations and leg swelling.  Gastrointestinal: Negative for nausea, vomiting and diarrhea.  Skin: Negative for rash.  Neurological: Negative for dizziness, weakness, numbness and headaches.  Psychiatric/Behavioral: The patient is not nervous/anxious.    Objective:  BP 138/82 mmHg  Pulse 83  Temp(Src) 98 F (36.7 C)  Resp 14  Ht 5\' 5"  (1.651 m)  Wt 205 lb (92.987 kg)  BMI 34.11 kg/m2  SpO2 96%  Physical Exam  Constitutional: She is oriented to person, place, and time. She appears well-developed and well-nourished. No distress.  HENT:  Head: Normocephalic and atraumatic.  Right Ear: External ear normal.  Left Ear: External ear normal.  Cardiovascular: Normal rate, regular rhythm and normal heart sounds.   Pulmonary/Chest: Effort normal and breath sounds normal. No respiratory distress. She has no wheezes. She has no rales. She exhibits no tenderness.  Neurological: She is  alert and oriented to person, place, and time. No cranial nerve deficit. She exhibits normal muscle tone. Coordination normal.  Skin: Skin is warm and dry. No rash noted. She is not diaphoretic.  Psychiatric: She has a normal mood and affect. Her behavior is normal. Judgment and thought content normal.    Assessment & Plan:   Valen was seen today for medication refill.  Diagnoses and all orders for this visit:  Hyperlipidemia -     Lipid panel -     Basic Metabolic Panel (BMET) -     atorvastatin (LIPITOR) 40 MG tablet; Take 1 tablet (40 mg  total) by mouth daily at 6 PM.  Bilateral leg edema -     torsemide (DEMADEX) 5 MG tablet; Take 1 tablet (5 mg total) by mouth daily.  Diuretic-induced hypokalemia -     potassium chloride (K-DUR) 10 MEQ tablet; Take 1 tablet (10 mEq total) by mouth daily.  Flu vaccine need  Other orders -     LDL cholesterol, direct   I have discontinued Ms. Olund's FLUZONE HIGH-DOSE. I am also having her maintain her misoprostol, omeprazole, Multiple Vitamins, Omega-3 Fatty Acids (FISH OIL PO), aspirin, CALCIUM-VITAMIN D PO, traMADol, gabapentin, amLODipine, oxybutynin, predniSONE, torsemide, potassium chloride, atorvastatin, and diclofenac.  Meds ordered this encounter  Medications  . predniSONE (DELTASONE) 5 MG tablet    Sig: Take 15 mg by mouth daily.    Refill:  0  . torsemide (DEMADEX) 5 MG tablet    Sig: Take 1 tablet (5 mg total) by mouth daily.    Dispense:  90 tablet    Refill:  3    Order Specific Question:  Supervising Provider    Answer:  Deborra Medina L [[id number]]  . potassium chloride (K-DUR) 10 MEQ tablet    Sig: Take 1 tablet (10 mEq total) by mouth daily.    Dispense:  90 tablet    Refill:  3    Order Specific Question:  Supervising Provider    Answer:  Deborra Medina L [[id number]]  . atorvastatin (LIPITOR) 40 MG tablet    Sig: Take 1 tablet (40 mg total) by mouth daily at 6 PM.    Dispense:  90 tablet    Refill:  3    Order Specific Question:  Supervising Provider    Answer:  Deborra Medina L [[id number]]  . DISCONTD: FLUZONE HIGH-DOSE 0.5 ML SUSY    Sig: Inject 1 Dose into the muscle once.    Refill:  0  . diclofenac (VOLTAREN) 75 MG EC tablet    Sig: Take 75 mg by mouth daily.     Follow-up: Return in about 3 months (around 05/12/2015) for Follow up.

## 2015-02-09 NOTE — Progress Notes (Signed)
Pre visit review using our clinic review tool, if applicable. No additional management support is needed unless otherwise documented below in the visit note. 

## 2015-02-10 ENCOUNTER — Telehealth: Payer: Self-pay

## 2015-02-10 NOTE — Telephone Encounter (Signed)
Informed pt that handicap form is filled out and ready to be picked up and will be up front and ready to be picked up at her convenience.

## 2015-02-10 NOTE — Telephone Encounter (Signed)
Called pt and could not leave message, pt has a mailbox that is not set up.

## 2015-02-10 NOTE — Telephone Encounter (Signed)
-----   Message from Rubbie Battiest, NP sent at 02/10/2015 10:04 AM EDT ----- Please let Ms. Emig know her handicap form was filled out and place it at the front for her to pick up. Thanks!

## 2015-02-10 NOTE — Telephone Encounter (Signed)
Informed pt that her handicap form was filled out and will be up front for her to pick up at her convenience.

## 2015-02-10 NOTE — Telephone Encounter (Signed)
-----   Message from Rubbie Battiest, NP sent at 02/10/2015 10:04 AM EDT ----- Please let Ms. Goin know her handicap form was filled out and place it at the front for her to pick up. Thanks!

## 2015-02-11 ENCOUNTER — Other Ambulatory Visit: Payer: Self-pay | Admitting: Nurse Practitioner

## 2015-02-14 ENCOUNTER — Encounter: Payer: Self-pay | Admitting: Nurse Practitioner

## 2015-02-14 DIAGNOSIS — M129 Arthropathy, unspecified: Secondary | ICD-10-CM | POA: Insufficient documentation

## 2015-02-14 DIAGNOSIS — Z0000 Encounter for general adult medical examination without abnormal findings: Secondary | ICD-10-CM | POA: Insufficient documentation

## 2015-02-14 DIAGNOSIS — R748 Abnormal levels of other serum enzymes: Secondary | ICD-10-CM | POA: Insufficient documentation

## 2015-02-14 DIAGNOSIS — Z23 Encounter for immunization: Secondary | ICD-10-CM | POA: Insufficient documentation

## 2015-02-14 NOTE — Assessment & Plan Note (Signed)
Testing refilled today. Will obtain bmet. FU prn worsening/failure to improve.

## 2015-02-14 NOTE — Assessment & Plan Note (Signed)
Torsemide filled today. Checking BMET.

## 2015-02-14 NOTE — Assessment & Plan Note (Signed)
Will obtain updated lipid panel today. Patient is watching diet for the most part she reports except that she still loves ice cream. Lipitor 40 mg was refilled.

## 2015-02-15 ENCOUNTER — Ambulatory Visit: Payer: Medicare Other | Admitting: Family Medicine

## 2015-02-24 ENCOUNTER — Other Ambulatory Visit: Payer: Self-pay

## 2015-02-24 DIAGNOSIS — E785 Hyperlipidemia, unspecified: Secondary | ICD-10-CM

## 2015-02-24 MED ORDER — ATORVASTATIN CALCIUM 40 MG PO TABS: 40.0000 mg | ORAL_TABLET | Freq: Every day | ORAL | Status: DC

## 2015-03-02 ENCOUNTER — Ambulatory Visit: Payer: Medicare Other | Admitting: Nurse Practitioner

## 2015-03-10 ENCOUNTER — Ambulatory Visit (INDEPENDENT_AMBULATORY_CARE_PROVIDER_SITE_OTHER): Payer: Medicare Other | Admitting: Nurse Practitioner

## 2015-03-10 ENCOUNTER — Encounter: Payer: Self-pay | Admitting: Nurse Practitioner

## 2015-03-10 VITALS — BP 116/66 | HR 70 | Temp 98.2°F | Resp 12 | Ht 65.0 in | Wt 206.8 lb

## 2015-03-10 DIAGNOSIS — E785 Hyperlipidemia, unspecified: Secondary | ICD-10-CM

## 2015-03-10 MED ORDER — ATORVASTATIN CALCIUM 80 MG PO TABS: 80.0000 mg | ORAL_TABLET | Freq: Every day | ORAL | Status: DC

## 2015-03-10 NOTE — Progress Notes (Signed)
Patient ID: Theresa Reid, female    DOB: Apr 07, 1941  Age: 74 y.o. MRN: [id number]  CC: Medication Refill   HPI AMMARAH MYSZKA presents for medication refill.   1) Atorvastatin refill requested 40 mg two tablets at night Oct. Last lipid panel so changed to 80 mg   Diet- gave up ice cream she reports for the winter  Exercise- More active   History Selicia has a past medical history of Hyperlipidemia; Hypertension; GERD (gastroesophageal reflux disease); and Arthritis.   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   Her family history includes Alcohol abuse in her mother; Diabetes in her father; Heart disease in her father.She reports that she has never smoked. She has never used smokeless tobacco. She reports that she does not drink alcohol or use illicit drugs.  Outpatient Prescriptions Prior to Visit  Medication Sig Dispense Refill  . amLODipine (NORVASC) 5 MG tablet Take 1 tablet (5 mg total) by mouth daily. 90 tablet 3  . aspirin 81 MG tablet Take by mouth.    Marland Kitchen CALCIUM-VITAMIN D PO Take by mouth.    . diclofenac (VOLTAREN) 75 MG EC tablet Take 75 mg by mouth daily.    Marland Kitchen gabapentin (NEURONTIN) 100 MG capsule Take 1 capsule (100 mg total) by mouth at bedtime. 30 capsule 0  . misoprostol (CYTOTEC) 200 MCG tablet     . Multiple Vitamins tablet Take by mouth.    . Omega-3 Fatty Acids (FISH OIL PO)     . omeprazole (PRILOSEC) 20 MG capsule Take by mouth.    . oxybutynin (DITROPAN) 5 MG tablet Take 1 tablet (5 mg total) by mouth 2 (two) times daily. 180 tablet 3  . potassium chloride (K-DUR) 10 MEQ tablet Take 1 tablet (10 mEq total) by mouth daily. 90 tablet 3  . predniSONE (DELTASONE) 5 MG tablet Take 15 mg by mouth daily.  0  . torsemide (DEMADEX) 5 MG tablet Take 1 tablet (5 mg total) by mouth daily. 90 tablet 3  . traMADol (ULTRAM) 50 MG tablet Take 1 tablet (50 mg total) by mouth daily  as needed. 30 tablet 2  . atorvastatin (LIPITOR) 40 MG tablet Take 1 tablet (40 mg total) by mouth daily at 6 PM. 90 tablet 3   No facility-administered medications prior to visit.    ROS Review of Systems  Constitutional: Negative for fever, chills, diaphoresis and fatigue.  Respiratory: Negative for cough, chest tightness, shortness of breath and wheezing.   Cardiovascular: Negative for chest pain, palpitations and leg swelling.  Gastrointestinal: Negative for nausea, vomiting and diarrhea.  Musculoskeletal: Negative for myalgias.    Objective:  BP 116/66 mmHg  Pulse 70  Temp(Src) 98.2 F (36.8 C)  Resp 12  Ht 5\' 5"  (1.651 m)  Wt 206 lb 12.8 oz (93.804 kg)  BMI 34.41 kg/m2  SpO2 94%  Physical Exam  Constitutional: She is oriented to person, place, and time. She appears well-developed and well-nourished. No distress.  HENT:  Head: Normocephalic and atraumatic.  Right Ear: External ear normal.  Left Ear: External ear normal.  Cardiovascular: Normal rate, regular rhythm and normal heart sounds.  Exam reveals no gallop and no friction rub.   No murmur heard. Pulmonary/Chest: Effort normal and breath sounds normal. No respiratory distress. She has no wheezes. She has no rales. She exhibits no tenderness.  Neurological: She is alert and oriented to person, place, and time. No cranial nerve deficit.  She exhibits normal muscle tone. Coordination normal.  Skin: Skin is warm and dry. No rash noted. She is not diaphoretic.  Psychiatric: She has a normal mood and affect. Her behavior is normal. Judgment and thought content normal.   Assessment & Plan:   Amrutha was seen today for medication refill.  Diagnoses and all orders for this visit:  Hyperlipidemia  Other orders -     atorvastatin (LIPITOR) 80 MG tablet; Take 1 tablet (80 mg total) by mouth daily.   I have discontinued Ms. Pinkney's atorvastatin. I am also having her start on atorvastatin. Additionally, I am having her  maintain her misoprostol, omeprazole, Multiple Vitamins, Omega-3 Fatty Acids (FISH OIL PO), aspirin, CALCIUM-VITAMIN D PO, traMADol, gabapentin, amLODipine, oxybutynin, predniSONE, torsemide, potassium chloride, and diclofenac.  Meds ordered this encounter  Medications  . atorvastatin (LIPITOR) 80 MG tablet    Sig: Take 1 tablet (80 mg total) by mouth daily.    Dispense:  90 tablet    Refill:  3    Order Specific Question:  Supervising Provider    Answer:  Crecencio Mc [[id number]]     Follow-up: Return in about 5 months (around 08/11/2015) for Follow up for Lipid panel and medications.

## 2015-03-10 NOTE — Assessment & Plan Note (Signed)
Lipid panel done in Oct. Upped her cholesterol medication to two tablets at night. Will follow up in April and repeat a lipid panel. Advised healthy diet and exercise. Will follow

## 2015-03-10 NOTE — Progress Notes (Signed)
Pre visit review using our clinic review tool, if applicable. No additional management support is needed unless otherwise documented below in the visit note. 

## 2015-05-12 ENCOUNTER — Ambulatory Visit: Payer: Medicare Other | Admitting: Nurse Practitioner

## 2015-07-01 ENCOUNTER — Encounter: Payer: Self-pay | Admitting: Nurse Practitioner

## 2015-07-01 ENCOUNTER — Ambulatory Visit (INDEPENDENT_AMBULATORY_CARE_PROVIDER_SITE_OTHER): Payer: Medicare Other | Admitting: Nurse Practitioner

## 2015-07-01 VITALS — BP 116/78 | HR 86 | Temp 98.0°F | Resp 16 | Ht 65.0 in | Wt 206.6 lb

## 2015-07-01 DIAGNOSIS — L219 Seborrheic dermatitis, unspecified: Secondary | ICD-10-CM | POA: Insufficient documentation

## 2015-07-01 DIAGNOSIS — L218 Other seborrheic dermatitis: Secondary | ICD-10-CM | POA: Diagnosis not present

## 2015-07-01 MED ORDER — BETAMETHASONE VALERATE 0.12 % EX FOAM: 1.0000 "application " | Freq: Two times a day (BID) | CUTANEOUS | Status: DC

## 2015-07-01 NOTE — Assessment & Plan Note (Signed)
New onset dermatitis of scalp More subjective than objective Sent in betamethasone Valerate 0.12% foam twice daily for 2 weeks max FU prn worsening/failure to improve.

## 2015-07-01 NOTE — Patient Instructions (Signed)
Try the foam for 2 weeks maximum. 1-2 times daily for itchy scalp.

## 2015-07-01 NOTE — Progress Notes (Signed)
Patient ID: Theresa Reid, female    DOB: Jul 31, 1940  Age: 75 y.o. MRN: [id number]  CC: Hair/Scalp Problem   HPI Theresa Reid presents for itchy scalp x 1 month.   1) Was there in Dec and then went away and came back.  Mild pruritis all over Occasionally scabs in places  Denies oozing or pain Denies changes to food, usual hair products- see tx to date  Treatment to date: T-gel hair product and head and shoulders - not helpful   History Theresa Reid has a past medical history of Hyperlipidemia; Hypertension; GERD (gastroesophageal reflux disease); and Arthritis.   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   Her family history includes Alcohol abuse in her mother; Diabetes in her father; Heart disease in her father.She reports that she has never smoked. She has never used smokeless tobacco. She reports that she does not drink alcohol or use illicit drugs.  Outpatient Prescriptions Prior to Visit  Medication Sig Dispense Refill  . amLODipine (NORVASC) 5 MG tablet Take 1 tablet (5 mg total) by mouth daily. 90 tablet 3  . aspirin 81 MG tablet Take by mouth.    Marland Kitchen atorvastatin (LIPITOR) 80 MG tablet Take 1 tablet (80 mg total) by mouth daily. 90 tablet 3  . CALCIUM-VITAMIN D PO Take by mouth.    . diclofenac (VOLTAREN) 75 MG EC tablet Take 75 mg by mouth daily.    . misoprostol (CYTOTEC) 200 MCG tablet     . Multiple Vitamins tablet Take by mouth.    . Omega-3 Fatty Acids (FISH OIL PO)     . omeprazole (PRILOSEC) 20 MG capsule Take by mouth.    . oxybutynin (DITROPAN) 5 MG tablet Take 1 tablet (5 mg total) by mouth 2 (two) times daily. 180 tablet 3  . torsemide (DEMADEX) 5 MG tablet Take 1 tablet (5 mg total) by mouth daily. 90 tablet 3  . traMADol (ULTRAM) 50 MG tablet Take 1 tablet (50 mg total) by mouth daily as needed. 30 tablet 2  . gabapentin (NEURONTIN) 100 MG capsule Take 1 capsule (100 mg  total) by mouth at bedtime. 30 capsule 0  . potassium chloride (K-DUR) 10 MEQ tablet Take 1 tablet (10 mEq total) by mouth daily. 90 tablet 3  . predniSONE (DELTASONE) 5 MG tablet Take 15 mg by mouth daily.  0   No facility-administered medications prior to visit.    ROS Review of Systems  Constitutional: Negative for fever, chills, diaphoresis and fatigue.  Respiratory: Negative for chest tightness, shortness of breath and wheezing.   Cardiovascular: Negative for chest pain, palpitations and leg swelling.  Gastrointestinal: Negative for nausea, vomiting and diarrhea.  Skin: Positive for rash.  Neurological: Negative for dizziness, weakness, numbness and headaches.  Psychiatric/Behavioral: The patient is not nervous/anxious.     Objective:  BP 116/78 mmHg  Pulse 86  Temp(Src) 98 F (36.7 C) (Oral)  Resp 16  Ht 5\' 5"  (1.651 m)  Wt 206 lb 9.6 oz (93.713 kg)  BMI 34.38 kg/m2  SpO2 98%  Physical Exam  Constitutional: She is oriented to person, place, and time. She appears well-developed and well-nourished. No distress.  HENT:  Head: Normocephalic and atraumatic.  Right Ear: External ear normal.  Left Ear: External ear normal.  Cardiovascular: Normal rate, regular rhythm and normal heart sounds.  Exam reveals no gallop and no friction rub.   No murmur heard. Pulmonary/Chest: Effort normal and breath  sounds normal. No respiratory distress. She has no wheezes. She has no rales. She exhibits no tenderness.  Neurological: She is alert and oriented to person, place, and time. No cranial nerve deficit. She exhibits normal muscle tone. Coordination normal.  Skin: Skin is warm and dry. She is not diaphoretic.  Scalp does not appear to have lesions or erythema, scaling, papules ect...  Psychiatric: She has a normal mood and affect. Her behavior is normal. Judgment and thought content normal.   Assessment & Plan:   Arianie was seen today for hair/scalp problem.  Diagnoses and all orders  for this visit:  Seborrheic dermatitis of scalp  Other orders -     Betamethasone Valerate 0.12 % foam; Apply 1 application topically 2 (two) times daily. Max 2 weeks of use  I have discontinued Theresa Reid's gabapentin and predniSONE. I am also having her start on Betamethasone Valerate. Additionally, I am having her maintain her misoprostol, omeprazole, Multiple Vitamins, Omega-3 Fatty Acids (FISH OIL PO), aspirin, CALCIUM-VITAMIN D PO, traMADol, amLODipine, oxybutynin, torsemide, diclofenac, atorvastatin, and potassium chloride.  Meds ordered this encounter  Medications  . potassium chloride (K-DUR,KLOR-CON) 10 MEQ tablet    Sig:   . Betamethasone Valerate 0.12 % foam    Sig: Apply 1 application topically 2 (two) times daily. Max 2 weeks of use    Dispense:  50 g    Refill:  0    Order Specific Question:  Supervising Provider    Answer:  Crecencio Mc [[id number]]     Follow-up: Return if symptoms worsen or fail to improve.

## 2015-07-02 ENCOUNTER — Telehealth: Payer: Self-pay

## 2015-07-02 ENCOUNTER — Telehealth: Payer: Self-pay | Admitting: Nurse Practitioner

## 2015-07-02 NOTE — Telephone Encounter (Signed)
PA for Bethasone valer 0.12% completed on Cover my meds.

## 2015-07-02 NOTE — Telephone Encounter (Signed)
Pt called about a prescription that was called in yesterday for Betamethasone Valerate 0.12 % foam. Pt states her insurance does not cover it. Pt needs another medication if possible. Pharmacy is [id number] [place], [place] - [address] @ [phone].  Thank you!

## 2015-07-02 NOTE — Telephone Encounter (Signed)
Notifed pt that a PA has been submitted for her RX, she will be notified with a response.

## 2015-07-05 ENCOUNTER — Other Ambulatory Visit: Payer: Self-pay | Admitting: Nurse Practitioner

## 2015-07-05 MED ORDER — CLOBETASOL PROPIONATE 0.05 % EX SOLN: 1.0000 "application " | Freq: Two times a day (BID) | CUTANEOUS | Status: DC

## 2015-07-05 NOTE — Telephone Encounter (Signed)
PA was denied for [employer] 0.12% due to patient not have tried, two of the following formulary alternatives: Augmented bethmethasone dipropionate cream/gel/lotion/ointment, cormax scalp application, clobetasol gel/ointment/shampoo or clobetasol E. , Dexoximetasone cream, fluocinonide gel/ointmnet/solution/cream or fluocinonide E cream, Fluticasone, Habobetasol, mometasone, Triamcinolone cream/ointment.    Please advise?

## 2015-07-05 NOTE — Telephone Encounter (Signed)
Sent in Cormax scalp application

## 2015-07-05 NOTE — Telephone Encounter (Signed)
Spoke with the patient and notified her of the change.  Thanks

## 2015-07-07 DIAGNOSIS — M791 Myalgia: Secondary | ICD-10-CM | POA: Diagnosis not present

## 2015-07-15 DIAGNOSIS — Z79899 Other long term (current) drug therapy: Secondary | ICD-10-CM | POA: Diagnosis not present

## 2015-07-16 LAB — HEPATIC FUNCTION PANEL
ALK PHOS: 129 U/L — AB (ref 25–125)
ALT: 19 U/L (ref 7–35)
AST: 19 U/L (ref 13–35)
Bilirubin, Total: 0.3 mg/dL

## 2015-07-16 LAB — CBC AND DIFFERENTIAL
HEMATOCRIT: 43 % (ref 36–46)
HEMOGLOBIN: 14.2 g/dL (ref 12.0–16.0)
Neutrophils Absolute: 10 /uL
PLATELETS: 353 10*3/uL (ref 150–399)
WBC: 11.9 10^3/mL

## 2015-07-16 LAB — BASIC METABOLIC PANEL
BUN: 10 mg/dL (ref 4–21)
CREATININE: 0.5 mg/dL (ref 0.5–1.1)
Glucose: 113 mg/dL
Potassium: 4.2 mmol/L (ref 3.4–5.3)
Sodium: 142 mmol/L (ref 137–147)

## 2015-08-13 ENCOUNTER — Encounter: Payer: Self-pay | Admitting: Nurse Practitioner

## 2015-08-13 ENCOUNTER — Ambulatory Visit (INDEPENDENT_AMBULATORY_CARE_PROVIDER_SITE_OTHER): Payer: Medicare Other | Admitting: Nurse Practitioner

## 2015-08-13 VITALS — BP 134/78 | HR 80 | Temp 98.1°F | Ht 65.0 in | Wt 208.2 lb

## 2015-08-13 DIAGNOSIS — L218 Other seborrheic dermatitis: Secondary | ICD-10-CM

## 2015-08-13 DIAGNOSIS — L219 Seborrheic dermatitis, unspecified: Secondary | ICD-10-CM

## 2015-08-13 DIAGNOSIS — E785 Hyperlipidemia, unspecified: Secondary | ICD-10-CM | POA: Insufficient documentation

## 2015-08-13 DIAGNOSIS — I10 Essential (primary) hypertension: Secondary | ICD-10-CM

## 2015-08-13 LAB — LIPID PANEL
CHOLESTEROL: 206 mg/dL — AB (ref 0–200)
HDL: 67 mg/dL (ref >39.00)
LDL CALC: 105 mg/dL — AB (ref 0–99)
NonHDL: 138.76
Total CHOL/HDL Ratio: 3
Triglycerides: 167 mg/dL — ABNORMAL HIGH (ref 0.0–149.0)
VLDL: 33.4 mg/dL (ref 0.0–40.0)

## 2015-08-13 MED ORDER — OMEPRAZOLE 20 MG PO CPDR: 20.0000 mg | DELAYED_RELEASE_CAPSULE | Freq: Every day | ORAL | Status: DC

## 2015-08-13 MED ORDER — MISOPROSTOL 200 MCG PO TABS: 200.0000 ug | ORAL_TABLET | Freq: Two times a day (BID) | ORAL | Status: DC

## 2015-08-13 NOTE — Progress Notes (Signed)
Patient ID: Theresa Reid, female    DOB: 06/07/1940  Age: 75 y.o. MRN: [id number]  CC: Follow-up   HPI Theresa Reid presents for follow up of HTN, Dermatitis, and HLD.   1) HLD- wants to do fasting lipid profile today Pt reports she cut out her nightly bowel of ice cream   2) Scalp improved with foam  3) Stable on HTN meds and last BMET was in March   History Theresa Reid has a past medical history of Hyperlipidemia; Hypertension; GERD (gastroesophageal reflux disease); and Arthritis.   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   Her family history includes Alcohol abuse in her mother; Diabetes in her father; Heart disease in her father.She reports that she has never smoked. She has never used smokeless tobacco. She reports that she does not drink alcohol or use illicit drugs.  Outpatient Prescriptions Prior to Visit  Medication Sig Dispense Refill  . amLODipine (NORVASC) 5 MG tablet Take 1 tablet (5 mg total) by mouth daily. 90 tablet 3  . aspirin 81 MG tablet Take by mouth.    Marland Kitchen atorvastatin (LIPITOR) 80 MG tablet Take 1 tablet (80 mg total) by mouth daily. 90 tablet 3  . CALCIUM-VITAMIN D PO Take by mouth.    . clobetasol (CORMAX SCALP APPLICATION) [id number] % external solution Apply 1 application topically 2 (two) times daily. Max 2 weeks 50 mL 0  . diclofenac (VOLTAREN) 75 MG EC tablet Take 75 mg by mouth daily.    . Multiple Vitamins tablet Take by mouth.    . Omega-3 Fatty Acids (FISH OIL PO)     . oxybutynin (DITROPAN) 5 MG tablet Take 1 tablet (5 mg total) by mouth 2 (two) times daily. 180 tablet 3  . potassium chloride (K-DUR,KLOR-CON) 10 MEQ tablet     . torsemide (DEMADEX) 5 MG tablet Take 1 tablet (5 mg total) by mouth daily. 90 tablet 3  . misoprostol (CYTOTEC) 200 MCG tablet     . omeprazole (PRILOSEC) 20 MG capsule Take by mouth.    . Betamethasone Valerate 0.12 % foam Apply 1  application topically 2 (two) times daily. Max 2 weeks of use 50 g 0  . traMADol (ULTRAM) 50 MG tablet Take 1 tablet (50 mg total) by mouth daily as needed. 30 tablet 2   No facility-administered medications prior to visit.    ROS Review of Systems  Constitutional: Negative for fever, chills, diaphoresis and fatigue.  Respiratory: Negative for chest tightness, shortness of breath and wheezing.   Cardiovascular: Negative for chest pain, palpitations and leg swelling.  Gastrointestinal: Negative for nausea, vomiting and diarrhea.  Skin: Negative for rash.  Neurological: Negative for dizziness, weakness, numbness and headaches.  Psychiatric/Behavioral: The patient is not nervous/anxious.     Objective:  BP 134/78 mmHg  Pulse 80  Temp(Src) 98.1 F (36.7 C) (Oral)  Ht 5\' 5"  (1.651 m)  Wt 208 lb 4 oz (94.462 kg)  BMI 34.65 kg/m2  SpO2 92%  Physical Exam  Constitutional: She is oriented to person, place, and time. She appears well-developed and well-nourished. No distress.  HENT:  Head: Normocephalic and atraumatic.  Right Ear: External ear normal.  Left Ear: External ear normal.  Eyes: Right eye exhibits no discharge. Left eye exhibits no discharge. No scleral icterus.  Cardiovascular: Normal rate, regular rhythm and normal heart sounds.   Pulmonary/Chest: Effort normal and breath sounds normal. No respiratory distress. She has no  wheezes. She has no rales. She exhibits no tenderness.  Neurological: She is alert and oriented to person, place, and time. No cranial nerve deficit. She exhibits normal muscle tone. Coordination normal.  Skin: Skin is warm and dry. No rash noted. She is not diaphoretic.  Psychiatric: She has a normal mood and affect. Her behavior is normal. Judgment and thought content normal.   Assessment & Plan:   Theresa Reid was seen today for follow-up.  Diagnoses and all orders for this visit:  HLD (hyperlipidemia) -     Lipid Profile  Other orders -      omeprazole (PRILOSEC) 20 MG capsule; Take 1 capsule (20 mg total) by mouth daily. -     misoprostol (CYTOTEC) 200 MCG tablet; Take 1 tablet (200 mcg total) by mouth 2 (two) times daily.   I have discontinued Ms. Holstine's traMADol and Betamethasone Valerate. I have also changed her omeprazole and misoprostol. Additionally, I am having her maintain her Multiple Vitamins, Omega-3 Fatty Acids (FISH OIL PO), aspirin, CALCIUM-VITAMIN D PO, amLODipine, oxybutynin, torsemide, diclofenac, atorvastatin, potassium chloride, clobetasol, and predniSONE.  Meds ordered this encounter  Medications  . predniSONE (DELTASONE) 5 MG tablet    Sig:     Refill:  0  . omeprazole (PRILOSEC) 20 MG capsule    Sig: Take 1 capsule (20 mg total) by mouth daily.    Dispense:  90 capsule    Refill:  3  . misoprostol (CYTOTEC) 200 MCG tablet    Sig: Take 1 tablet (200 mcg total) by mouth 2 (two) times daily.    Dispense:  180 tablet    Refill:  3     Follow-up: Return in about 6 months (around 02/12/2016) for Follow up.

## 2015-08-13 NOTE — Patient Instructions (Addendum)
Visit the lab please. Follow up in 6 months.   Good to see you!

## 2015-08-13 NOTE — Progress Notes (Signed)
Pre visit review using our clinic review tool, if applicable. No additional management support is needed unless otherwise documented below in the visit note. 

## 2015-08-22 NOTE — Assessment & Plan Note (Signed)
Resolved

## 2015-08-22 NOTE — Assessment & Plan Note (Signed)
Fasting today for repeat of lipid panel Stable on fish oil and lipitor 80 mg

## 2015-08-22 NOTE — Assessment & Plan Note (Signed)
BP Readings from Last 3 Encounters:  08/13/15 134/78  07/01/15 116/78  03/10/15 116/66   Stable today

## 2015-11-15 ENCOUNTER — Other Ambulatory Visit: Payer: Self-pay | Admitting: Nurse Practitioner

## 2015-11-15 DIAGNOSIS — R600 Localized edema: Secondary | ICD-10-CM

## 2015-11-15 DIAGNOSIS — E876 Hypokalemia: Secondary | ICD-10-CM

## 2015-11-15 DIAGNOSIS — T502X5A Adverse effect of carbonic-anhydrase inhibitors, benzothiadiazides and other diuretics, initial encounter: Principal | ICD-10-CM

## 2015-12-08 DIAGNOSIS — M791 Myalgia: Secondary | ICD-10-CM | POA: Diagnosis not present

## 2015-12-08 DIAGNOSIS — M353 Polymyalgia rheumatica: Secondary | ICD-10-CM | POA: Diagnosis not present

## 2015-12-14 ENCOUNTER — Ambulatory Visit: Payer: Medicare Other | Admitting: Family Medicine

## 2015-12-14 ENCOUNTER — Ambulatory Visit: Payer: Medicare Other | Admitting: Family

## 2015-12-16 ENCOUNTER — Encounter: Payer: Self-pay | Admitting: Family

## 2015-12-16 ENCOUNTER — Ambulatory Visit (INDEPENDENT_AMBULATORY_CARE_PROVIDER_SITE_OTHER): Payer: Medicare Other | Admitting: Family

## 2015-12-16 VITALS — BP 120/84 | HR 80 | Temp 97.9°F | Resp 16 | Wt 216.0 lb

## 2015-12-16 DIAGNOSIS — R42 Dizziness and giddiness: Secondary | ICD-10-CM | POA: Diagnosis not present

## 2015-12-16 DIAGNOSIS — I10 Essential (primary) hypertension: Secondary | ICD-10-CM | POA: Diagnosis not present

## 2015-12-16 NOTE — Assessment & Plan Note (Signed)
Stable. Well-controlled. Pending CMP to ensure electrolytes within normal limits.

## 2015-12-16 NOTE — Patient Instructions (Signed)
My pleasure to meet a day. I'll look forward to seeing you for your physical.  At this point, I suspect Benign paroxysmal positional vertigo (BPPV) and have included information from [facility] below.   You may look videos for Epley's maneuvers as we discussed online.   If dizziness/vertigo persists, a referral to ENT for further evaluation and medication may be appropriate.   If there is no improvement in your symptoms, or if there is any worsening of symptoms, or if you have any additional concerns, please return to this clinic for re-evaluation; or, if we are closed, consider going to the Emergency Room for evaluation.   What is BPPV?  BPPV  is one of the most common causes of vertigo - the sudden sensation that you're spinning or that the inside of your head is spinning. Benign paroxysmal positional vertigo causes brief episodes of mild to intense dizziness. Benign paroxysmal positional vertigo is usually triggered by specific changes in the position of your head. This might occur when you tip your head up or down, when you lie down, or when you turn over or sit up in bed. Although benign paroxysmal positional vertigo can be a bothersome problem, it's rarely serious except when it increases the chance of falls.  If you experience dizziness associated with benign paroxysmal positional vertigo (BPPV), consider these tips: Be aware of the possibility of losing your balance, which can lead to falling and serious injury.  Sit down immediately when you feel dizzy.  Use good lighting if you get up at night.  Walk with a cane for stability if you're at risk of falling.  Work closely with your doctor to manage your symptoms effectively. BPPV may recur even after successful therapy. Fortunately, although there's no cure, the condition can be managed with physical therapy and home treatments.   Dizziness [Uncertain Cause] Dizziness is a common symptom sometimes described as "lightheadedness" or  feeling like you are going to faint. If it lasts for only a few seconds and is related to changes in position (such as getting up after lying or sitting for a long time), it is usually not a sign of anything serious. Dizziness that lasts for minutes to hours, or comes on for no apparent reason, may be a sign of a more serious problem (such as dehydration, a medicine reaction, disease of the heart or brain). Today's exam did not show an exact cause for your dizzy spell . Sometimes additional tests are required before a cause can be found. Therefore, it is important to follow up with your doctor if your symptoms continue. Home Care: 1) If a dizzy spell occurs and lasts more than a few seconds, lie down until it passes. If you are lying down, then you cannot hurt yourself by falling if you do faint. 2) Do not drive or operate dangerous equipment until the dizzy spells have stopped for at least 48 hours. 3) If dizzy spells occur with sudden standing, this may be a sign of mild dehydration. Drink extra fluids over the next few days. 4) If you recently started a new medicine or if you had the dose of a current medicine increased (especially blood pressure medicine), talk with the prescribing doctor about your symptoms. Dose adjustments may be needed. Follow Up with your doctor for further evaluation within the next seven days, if your symptoms continue. Get Prompt Medical Attention if any of the following occur: -- Worsening of your symptoms -- Fainting, headache or seizure -- Repeated vomiting --  Feeling like you or the room is spinning -- Chest, arm, neck, back or jaw pain -- Palpitations (the sense that your heart is fluttering or beating fast or hard) -- Shortness of breath -- Blood in vomit or stool (black or red color) -- Weakness of an arm or leg or one side of the face -- Difficulty with speech or vision  2000-2015 [employer] [address], [place] [address]. All  rights reserved. This information is not intended as a substitute for professional medical care. Always follow your healthcare professional's instructions.

## 2015-12-16 NOTE — Progress Notes (Signed)
Subjective:    Patient ID: Theresa Reid, female    DOB: Sep 13, 1940, 75 y.o.   MRN: [id number]  CC: Theresa Reid is a 75 y.o. female who presents today for an acute visit.    HPI: Here for evaluation of vertigo, 2 episodes of over the past two weeks. No episode today, here for reassurance. She is also here to establish care. Had a terrible episode 8 years ago which resolved on its own without medication. Describes nausea and room spinning. Episodes last less than one minute and onset when lays down in bed 'too fast.' No syncope, dizzy, excessive sweating, hearing loss, tinnitus during episodes.  Denies exertional chest pain or pressure, numbness or tingling radiating to left arm or jaw, palpitations, dizziness, frequent headaches, changes in vision, or shortness of breath.   Nondiabetic.     HISTORY:  Past Medical History:  Diagnosis Date  . Arthritis   . GERD (gastroesophageal reflux disease)   . Hyperlipidemia   . Hypertension    Past Surgical History:  Procedure Laterality Date  . CHOLECYSTECTOMY    . OOPHORECTOMY Right    Cyst only (NOT OVARY)  . TOTAL KNEE ARTHROPLASTY Bilateral 05/05/2010   Family History  Problem Relation Age of Onset  . Alcohol abuse Mother   . Heart disease Father   . Diabetes Father     Allergies: Review of patient's allergies indicates no known allergies. Current Outpatient Prescriptions on File Prior to Visit  Medication Sig Dispense Refill  . amLODipine (NORVASC) 5 MG tablet Take 1 tablet by mouth  daily 90 tablet 0  . aspirin 81 MG tablet Take by mouth.    Marland Kitchen atorvastatin (LIPITOR) 80 MG tablet Take 1 tablet (80 mg total) by mouth daily. 90 tablet 3  . CALCIUM-VITAMIN D PO Take by mouth.    . diclofenac (VOLTAREN) 75 MG EC tablet Take 75 mg by mouth daily.    . misoprostol (CYTOTEC) 200 MCG tablet Take 1 tablet (200 mcg total) by mouth 2 (two) times daily. 180 tablet 3  . Multiple Vitamins tablet Take by mouth.    . Omega-3 Fatty  Acids (FISH OIL PO)     . omeprazole (PRILOSEC) 20 MG capsule Take 1 capsule (20 mg total) by mouth daily. 90 capsule 3  . oxybutynin (DITROPAN) 5 MG tablet Take 1 tablet by mouth two  times daily 180 tablet 0  . potassium chloride (K-DUR,KLOR-CON) 10 MEQ tablet Take 1 tablet by mouth  daily 90 tablet 0  . predniSONE (DELTASONE) 5 MG tablet   0  . torsemide (DEMADEX) 5 MG tablet Take 1 tablet by mouth  daily 90 tablet 0   No current facility-administered medications on file prior to visit.     Social History  Substance Use Topics  . Smoking status: Never Smoker  . Smokeless tobacco: Never Used  . Alcohol use No    Review of Systems  Constitutional: Negative for chills, fever and unexpected weight change.  HENT: Negative for congestion.   Respiratory: Negative for cough and shortness of breath.   Cardiovascular: Negative for chest pain, palpitations and leg swelling.  Gastrointestinal: Negative for nausea and vomiting.  Musculoskeletal: Negative for arthralgias and myalgias.  Skin: Negative for rash.  Neurological: Negative for dizziness, weakness and numbness.  Hematological: Negative for adenopathy.  Psychiatric/Behavioral: Negative for confusion.      Objective:    BP 120/84 (BP Location: Left Arm, Patient Position: Sitting, Cuff Size: Large)   Pulse 80  Temp 97.9 F (36.6 C) (Oral)   Resp 16   Wt 216 lb (98 kg)   SpO2 98%   BMI 35.94 kg/m   Orthostatic vitals. Laying down 150/82, 78. Sitting 156/80, 82 Standing 158/84, 92    Physical Exam  Constitutional: She appears well-developed and well-nourished.  HENT:  Head: Normocephalic and atraumatic.  Right Ear: Hearing, tympanic membrane, external ear and ear canal normal. No swelling or tenderness. Tympanic membrane is not erythematous and not bulging. No middle ear effusion.  Left Ear: Tympanic membrane, external ear and ear canal normal. No swelling or tenderness. Tympanic membrane is not erythematous and not  bulging.  No middle ear effusion.  Nose: Nose normal. No rhinorrhea. Right sinus exhibits no maxillary sinus tenderness and no frontal sinus tenderness. Left sinus exhibits no maxillary sinus tenderness and no frontal sinus tenderness.  Mouth/Throat: Uvula is midline, oropharynx is clear and moist and mucous membranes are normal. No posterior oropharyngeal edema or posterior oropharyngeal erythema.  Eyes: Conjunctivae, EOM and lids are normal. Pupils are equal, round, and reactive to light. Lids are everted and swept, no foreign bodies found.  Normal fundus bilaterally  Cardiovascular: Normal rate, regular rhythm, normal heart sounds and normal pulses.   Pulmonary/Chest: Effort normal and breath sounds normal. She has no wheezes. She has no rhonchi. She has no rales.  Lymphadenopathy:       Head (right side): No submental, no submandibular, no tonsillar, no preauricular, no posterior auricular and no occipital adenopathy present.       Head (left side): No submental, no submandibular, no tonsillar, no preauricular, no posterior auricular and no occipital adenopathy present.    She has no cervical adenopathy.       Right cervical: No superficial cervical, no deep cervical and no posterior cervical adenopathy present.      Left cervical: No superficial cervical, no deep cervical and no posterior cervical adenopathy present.  Neurological: She is alert. She has normal strength. No cranial nerve deficit or sensory deficit. She displays a negative Romberg sign.  Reflex Scores:      Bicep reflexes are 2+ on the right side and 2+ on the left side.      Patellar reflexes are 2+ on the right side and 2+ on the left side. Grip equal and strong bilateral upper extremities. Gait strong and steady. Able to perform  finger-to-nose without difficulty.  Dix hall pike maneuver did not elicit dizziness. No nystagmus noted. Patient denied nausea or vertigo during maneuver.    Skin: Skin is warm and dry.    Psychiatric: She has a normal mood and affect. Her speech is normal and behavior is normal. Thought content normal.  Vitals reviewed.      Assessment & Plan:   Problem List Items Addressed This Visit      Cardiovascular and Mediastinum   Hypertension    Stable. Well-controlled. Pending CMP to ensure electrolytes within normal limits.        Other   Vertigo - Primary    Working diagnosis of benign proximal positional vertigo as symptoms associated when she lays down too fast. Episode self limiting and no vertigo today. Negative [employer]. Patient is not orthostatic. I'm very reassured by normal neurologic exam. Will continue close follow up. Return precautions given.       Relevant Orders   Comprehensive metabolic panel    Other Visit Diagnoses   None.       I have discontinued Ms. Ohmann's clobetasol.  I am also having her maintain her Multiple Vitamins, Omega-3 Fatty Acids (FISH OIL PO), aspirin, CALCIUM-VITAMIN D PO, diclofenac, atorvastatin, predniSONE, omeprazole, misoprostol, potassium chloride, oxybutynin, amLODipine, and torsemide.   No orders of the defined types were placed in this encounter.   Return precautions given.   Risks, benefits, and alternatives of the medications and treatment plan prescribed today were discussed, and patient expressed understanding.   Education regarding symptom management and diagnosis given to patient on AVS.  Continue to follow with Rubbie Battiest, NP for routine health maintenance.   Theresa Reid and I agreed with plan.   Mable Paris, FNP

## 2015-12-16 NOTE — Assessment & Plan Note (Addendum)
Working diagnosis of benign proximal positional vertigo as symptoms associated when she lays down too fast. Episode self limiting and no vertigo today. Negative [employer]. Patient is not orthostatic. I'm very reassured by normal neurologic exam. Will continue close follow up. Return precautions given.

## 2015-12-17 LAB — COMPREHENSIVE METABOLIC PANEL
ALT: 17 U/L (ref 0–35)
AST: 18 U/L (ref 0–37)
Albumin: 4 g/dL (ref 3.5–5.2)
Alkaline Phosphatase: 104 U/L (ref 39–117)
BILIRUBIN TOTAL: 0.4 mg/dL (ref 0.2–1.2)
BUN: 8 mg/dL (ref 6–23)
CO2: 27 meq/L (ref 19–32)
CREATININE: 0.67 mg/dL (ref 0.40–1.20)
Calcium: 8.9 mg/dL (ref 8.4–10.5)
Chloride: 105 mEq/L (ref 96–112)
GFR: 90.8 mL/min (ref >60.00)
GLUCOSE: 115 mg/dL — AB (ref 70–99)
Potassium: 3.9 mEq/L (ref 3.5–5.1)
Sodium: 141 mEq/L (ref 135–145)
Total Protein: 7.1 g/dL (ref 6.0–8.3)

## 2016-02-08 ENCOUNTER — Ambulatory Visit (INDEPENDENT_AMBULATORY_CARE_PROVIDER_SITE_OTHER): Payer: Medicare Other | Admitting: Family

## 2016-02-08 ENCOUNTER — Encounter: Payer: Self-pay | Admitting: Family

## 2016-02-08 VITALS — BP 130/90 | HR 98 | Temp 98.1°F | Wt 215.6 lb

## 2016-02-08 DIAGNOSIS — R197 Diarrhea, unspecified: Secondary | ICD-10-CM | POA: Diagnosis not present

## 2016-02-08 DIAGNOSIS — I10 Essential (primary) hypertension: Secondary | ICD-10-CM

## 2016-02-08 DIAGNOSIS — Z1239 Encounter for other screening for malignant neoplasm of breast: Secondary | ICD-10-CM

## 2016-02-08 DIAGNOSIS — Z1231 Encounter for screening mammogram for malignant neoplasm of breast: Secondary | ICD-10-CM

## 2016-02-08 NOTE — Progress Notes (Signed)
Subjective:    Patient ID: Theresa Reid, female    DOB: 07/30/1940, 75 y.o.   MRN: [id number]  CC: Theresa Reid is a 75 y.o. female who presents today for an acute visit.    HPI: Patient here for acute visit with chief complaint of diarrhea 3 days. Has 3 diarrhea episodes every morning. Frequency unchanged. Watery brown. 'Feels great right now.' This is a/w abdominal pain across top of abdomen. The abdominal pain has been waxing and waning. No blood in stool. No N, V, fever. Tried mylanta, immodium, tums with some relief.  No food triggers. Has been eating bland diet and drinking lots of water.   Since 1980's had 'stomach problems' from NSAIDs which she took for OA. Has never had a GIB or ulcer per patient. States abdominal pain runs across top part of abdomen and last one minute to hours, goes away on its own. No blood in stool, unintentional weight loss, hoarseness,  Cough, trouble swallowing. When pain starts, 'starts filling up with gas and I start burping' which eases pain.  Takes misoprostol, prilosec, and diclofenac combination for OA pain.    Due for CPE- due for mammogram, colonoscopy. Has been doing cologuard.   H/o diverticulitis.      HISTORY:  Past Medical History:  Diagnosis Date  . Arthritis   . GERD (gastroesophageal reflux disease)   . Hyperlipidemia   . Hypertension    Past Surgical History:  Procedure Laterality Date  . CHOLECYSTECTOMY    . OOPHORECTOMY Right    Cyst only (NOT OVARY)  . TOTAL KNEE ARTHROPLASTY Bilateral 05/05/2010   Family History  Problem Relation Age of Onset  . Alcohol abuse Mother   . Heart disease Father   . Diabetes Father     Allergies: Review of patient's allergies indicates no known allergies. Current Outpatient Prescriptions on File Prior to Visit  Medication Sig Dispense Refill  . amLODipine (NORVASC) 5 MG tablet Take 1 tablet by mouth  daily 90 tablet 0  . aspirin 81 MG tablet Take by mouth.    Marland Kitchen atorvastatin  (LIPITOR) 80 MG tablet Take 1 tablet (80 mg total) by mouth daily. 90 tablet 3  . CALCIUM-VITAMIN D PO Take by mouth.    . diclofenac (VOLTAREN) 75 MG EC tablet Take 75 mg by mouth daily.    . misoprostol (CYTOTEC) 200 MCG tablet Take 1 tablet (200 mcg total) by mouth 2 (two) times daily. 180 tablet 3  . Multiple Vitamins tablet Take by mouth.    . Omega-3 Fatty Acids (FISH OIL PO)     . omeprazole (PRILOSEC) 20 MG capsule Take 1 capsule (20 mg total) by mouth daily. 90 capsule 3  . oxybutynin (DITROPAN) 5 MG tablet Take 1 tablet by mouth two  times daily 180 tablet 0  . potassium chloride (K-DUR,KLOR-CON) 10 MEQ tablet Take 1 tablet by mouth  daily 90 tablet 0  . predniSONE (DELTASONE) 5 MG tablet   0  . torsemide (DEMADEX) 5 MG tablet Take 1 tablet by mouth  daily 90 tablet 0   No current facility-administered medications on file prior to visit.     Social History  Substance Use Topics  . Smoking status: Never Smoker  . Smokeless tobacco: Never Used  . Alcohol use No    Review of Systems  Constitutional: Negative for chills and fever.  HENT: Negative for sore throat, trouble swallowing and voice change.   Respiratory: Negative for cough.   Cardiovascular:  Negative for chest pain and palpitations.  Gastrointestinal: Positive for abdominal pain and diarrhea. Negative for abdominal distention, blood in stool, constipation, nausea and vomiting.  Genitourinary: Negative for dysuria, flank pain and frequency.      Objective:    BP 130/90   Pulse 98   Temp 98.1 F (36.7 C) (Oral)   Wt 215 lb 9.6 oz (97.8 kg)   SpO2 96%   BMI 35.88 kg/m   Filed Weights   02/08/16 1457  Weight: 215 lb 9.6 oz (97.8 kg)    Physical Exam  Constitutional: She appears well-developed and well-nourished.  Eyes: Conjunctivae are normal.  Cardiovascular: Normal rate, regular rhythm, normal heart sounds and normal pulses.   Pulmonary/Chest: Effort normal and breath sounds normal. She has no wheezes.  She has no rhonchi. She has no rales.  Abdominal: Soft. Normal appearance and bowel sounds are normal. She exhibits no distension, no fluid wave, no ascites and no mass. There is no tenderness. There is no rigidity, no rebound, no guarding and no CVA tenderness.  Neurological: She is alert.  Skin: Skin is warm and dry.  Psychiatric: She has a normal mood and affect. Her speech is normal and behavior is normal. Thought content normal.  Vitals reviewed.      Assessment & Plan:      I am having Theresa Reid maintain her Multiple Vitamins, Omega-3 Fatty Acids (FISH OIL PO), aspirin, CALCIUM-VITAMIN D PO, diclofenac, atorvastatin, predniSONE, omeprazole, misoprostol, potassium chloride, oxybutynin, amLODipine, and torsemide.   No orders of the defined types were placed in this encounter.   Return precautions given.   Risks, benefits, and alternatives of the medications and treatment plan prescribed today were discussed, and patient expressed understanding.   Education regarding symptom management and diagnosis given to patient on AVS.  Continue to follow with Theresa Battiest, NP for routine health maintenance.   Theresa Reid and I agreed with plan.   Theresa Paris, FNP

## 2016-02-08 NOTE — Patient Instructions (Signed)
As discussed,  Please call me tomorrow and let me know how you're doing. If you continue to have abdominal pain, diarrhea, we will order CT scan.   Plenty of water.   I will look forward to hearing from you.  If there is no improvement in your symptoms, or if there is any worsening of symptoms, or if you have any additional concerns, please return for re-evaluation; or, if we are closed, consider going to the Emergency Room for evaluation if symptoms urgent.

## 2016-02-09 ENCOUNTER — Telehealth: Payer: Self-pay | Admitting: *Deleted

## 2016-02-09 ENCOUNTER — Encounter: Payer: Self-pay | Admitting: Family

## 2016-02-09 DIAGNOSIS — R197 Diarrhea, unspecified: Secondary | ICD-10-CM | POA: Insufficient documentation

## 2016-02-09 LAB — CBC WITH DIFFERENTIAL/PLATELET
Basophils Absolute: 0.1 10*3/uL (ref 0.0–0.1)
Basophils Relative: 1 % (ref 0.0–3.0)
EOS PCT: 1.9 % (ref 0.0–5.0)
Eosinophils Absolute: 0.2 10*3/uL (ref 0.0–0.7)
HEMATOCRIT: 42.6 % (ref 36.0–46.0)
HEMOGLOBIN: 14.4 g/dL (ref 12.0–15.0)
LYMPHS ABS: 2.3 10*3/uL (ref 0.7–4.0)
LYMPHS PCT: 21.9 % (ref 12.0–46.0)
MCHC: 33.7 g/dL (ref 30.0–36.0)
MCV: 87.8 fl (ref 78.0–100.0)
MONOS PCT: 8.4 % (ref 3.0–12.0)
Monocytes Absolute: 0.9 10*3/uL (ref 0.1–1.0)
NEUTROS PCT: 66.8 % (ref 43.0–77.0)
Neutro Abs: 6.9 10*3/uL (ref 1.4–7.7)
Platelets: 276 10*3/uL (ref 150.0–400.0)
RBC: 4.86 Mil/uL (ref 3.87–5.11)
RDW: 15.3 % (ref 11.5–15.5)
WBC: 10.4 10*3/uL (ref 4.0–10.5)

## 2016-02-09 LAB — LIPASE: LIPASE: 17 U/L (ref 11.0–59.0)

## 2016-02-09 LAB — COMPREHENSIVE METABOLIC PANEL
ALBUMIN: 4.3 g/dL (ref 3.5–5.2)
ALT: 18 U/L (ref 0–35)
AST: 18 U/L (ref 0–37)
Alkaline Phosphatase: 110 U/L (ref 39–117)
BUN: 10 mg/dL (ref 6–23)
CHLORIDE: 103 meq/L (ref 96–112)
CO2: 30 mEq/L (ref 19–32)
Calcium: 9.7 mg/dL (ref 8.4–10.5)
Creatinine, Ser: 0.76 mg/dL (ref 0.40–1.20)
GFR: 78.48 mL/min (ref >60.00)
Glucose, Bld: 86 mg/dL (ref 70–99)
POTASSIUM: 3.8 meq/L (ref 3.5–5.1)
SODIUM: 143 meq/L (ref 135–145)
Total Bilirubin: 0.4 mg/dL (ref 0.2–1.2)
Total Protein: 7.8 g/dL (ref 6.0–8.3)

## 2016-02-09 LAB — TSH: TSH: 3.52 u[IU]/mL (ref 0.35–4.50)

## 2016-02-09 LAB — LIPID PANEL
CHOL/HDL RATIO: 4
Cholesterol: 216 mg/dL — ABNORMAL HIGH (ref 0–200)
HDL: 61 mg/dL (ref >39.00)
LDL CALC: 117 mg/dL — AB (ref 0–99)
NONHDL: 154.91
Triglycerides: 190 mg/dL — ABNORMAL HIGH (ref 0.0–149.0)
VLDL: 38 mg/dL (ref 0.0–40.0)

## 2016-02-09 LAB — VITAMIN D 25 HYDROXY (VIT D DEFICIENCY, FRACTURES): VITD: 39.66 ng/mL (ref 30.00–100.00)

## 2016-02-09 NOTE — Telephone Encounter (Signed)
Pt wanted to Theresa Reid that she currently has no pain of diarrhea this morning, and would like to hold the CT scan for now.  Pt contact [phone]

## 2016-02-09 NOTE — Assessment & Plan Note (Addendum)
New complaint. Etiology of diarrhea unclear at this time. Afebrile and no focal abdominal pain. Considering viral gastritis versus chronic etiology such as GERD. Abdominal pain is not present at this time and Im very reassured by benign abdominal exam. Patient declined imaging today including CT abdomen and we jointly decided that since she feels well right now, she would let me know abdominal pain recurs. Pending labs to evaluate for underlying infection, liver, kidney etiology.

## 2016-02-09 NOTE — Telephone Encounter (Signed)
FYI

## 2016-02-09 NOTE — Assessment & Plan Note (Signed)
At goal. Will continue current regimen. Pending CMP.

## 2016-02-10 ENCOUNTER — Encounter: Payer: Self-pay | Admitting: Nurse Practitioner

## 2016-02-10 ENCOUNTER — Encounter: Payer: Self-pay | Admitting: Family

## 2016-02-11 ENCOUNTER — Ambulatory Visit (INDEPENDENT_AMBULATORY_CARE_PROVIDER_SITE_OTHER): Payer: Medicare Other

## 2016-02-11 VITALS — BP 134/70 | HR 94 | Temp 98.1°F | Resp 14 | Ht 64.0 in | Wt 216.8 lb

## 2016-02-11 DIAGNOSIS — Z0000 Encounter for general adult medical examination without abnormal findings: Secondary | ICD-10-CM | POA: Diagnosis not present

## 2016-02-11 DIAGNOSIS — Z23 Encounter for immunization: Secondary | ICD-10-CM

## 2016-02-11 NOTE — Progress Notes (Signed)
Care was provided under my supervision. I agree with the management as indicated in the note.  Amir Glaus DO  

## 2016-02-11 NOTE — Progress Notes (Signed)
Subjective:   Theresa Reid is a 75 y.o. female who presents for an Initial Medicare Annual Wellness Visit.  Review of Systems    No ROS.  Medicare Wellness Visit.  Cardiac Risk Factors include: advanced age (>51men, >45 women);hypertension     Objective:    Today's Vitals   02/11/16 1522  BP: 134/70  Pulse: 94  Resp: 14  Temp: 98.1 F (36.7 C)  TempSrc: Oral  SpO2: 96%  Weight: 216 lb 12.8 oz (98.3 kg)  Height: 5\' 4"  (1.626 m)   Body mass index is 37.21 kg/m.   Current Medications (verified) Outpatient Encounter Prescriptions as of 02/11/2016  Medication Sig  . amLODipine (NORVASC) 5 MG tablet Take 1 tablet by mouth  daily  . aspirin 81 MG tablet Take by mouth.  Marland Kitchen atorvastatin (LIPITOR) 80 MG tablet Take 1 tablet (80 mg total) by mouth daily.  Marland Kitchen CALCIUM-VITAMIN D PO Take by mouth.  . diclofenac (VOLTAREN) 75 MG EC tablet Take 75 mg by mouth daily.  . misoprostol (CYTOTEC) 200 MCG tablet Take 1 tablet (200 mcg total) by mouth 2 (two) times daily.  . Multiple Vitamins tablet Take by mouth.  . Omega-3 Fatty Acids (FISH OIL PO)   . omeprazole (PRILOSEC) 20 MG capsule Take 1 capsule (20 mg total) by mouth daily.  Marland Kitchen oxybutynin (DITROPAN) 5 MG tablet Take 1 tablet by mouth two  times daily  . potassium chloride (K-DUR,KLOR-CON) 10 MEQ tablet Take 1 tablet by mouth  daily  . predniSONE (DELTASONE) 5 MG tablet   . torsemide (DEMADEX) 5 MG tablet Take 1 tablet by mouth  daily   No facility-administered encounter medications on file as of 02/11/2016.     Allergies (verified) Review of patient's allergies indicates no known allergies.   History: Past Medical History:  Diagnosis Date  . Arthritis   . Diverticulitis   . GERD (gastroesophageal reflux disease)   . Hyperlipidemia   . Hypertension    Past Surgical History:  Procedure Laterality Date  . CHOLECYSTECTOMY    . OOPHORECTOMY Right    Cyst only (NOT OVARY)  . TOTAL KNEE ARTHROPLASTY Bilateral 05/05/2010     Family History  Problem Relation Age of Onset  . Alcohol abuse Mother   . Heart disease Father   . Diabetes Father    Social History   Occupational History  . Not on file.   Social History Main Topics  . Smoking status: Never Smoker  . Smokeless tobacco: Never Used  . Alcohol use No  . Drug use: No  . Sexual activity: No    Tobacco Counseling Counseling given: Not Answered   Activities of Daily Living In your present state of health, do you have any difficulty performing the following activities: 02/11/2016  Hearing? N  Vision? N  Difficulty concentrating or making decisions? N  Walking or climbing stairs? Y  Dressing or bathing? N  Doing errands, shopping? N  In the past six months, have you accidently leaked urine? Y  Do you have problems with loss of bowel control? Y  Managing your Medications? N  Managing your Finances? N  Housekeeping or managing your Housekeeping? N  Some recent data might be hidden    Immunizations and Health Maintenance Immunization History  Administered Date(s) Administered  . Influenza-Unspecified 12/15/2014, 01/12/2016  . Pneumococcal Conjugate-13 02/11/2016  . Pneumococcal Polysaccharide-23 11/08/2012  . Zoster 09/22/2008   There are no preventive care reminders to display for this patient.  Patient Care  Team: Burnard Hawthorne, FNP as PCP - General (Family Medicine)  Indicate any recent Medical Services you may have received from other than [facility] providers in the past year (date may be approximate).     Assessment:   This is a routine wellness examination for Theresa Reid. The goal of the wellness visit is to assist the patient how to close the gaps in care and create a preventative care plan for the patient.   Taking calcium VIT D as appropriate/Osteoporosis risk reviewed.  Medications reviewed; taking without issues or barriers.  Safety issues reviewed; lives alone.  Smoke detectors in the home. No firearms in the home.  Wears seatbelts when driving or riding with others. No violence in the home.  No identified risk were noted; The patient was oriented x 3; appropriate in dress and manner and no objective failures at ADL's or IADL's.   Body mass index; discussed the importance of a healthy diet, water intake and exercise. Educational material provided.  TDAP vaccine postponed for follow up with insurance.  Prevnar 13 administered L deltoid.  Tolerated well.  Health maintenance gap; closed.  Patient Concerns: None at this time. Follow up with PCP as needed.  Hearing/Vision screen Hearing Screening Comments: Passes the whisper test  Vision Screening Comments: Followed by Dr.Bell  Last OV 03/2015 Wears glasses Vision screening postponed due to being seen by her ophthalmologist within the last 12 months.  Dietary issues and exercise activities discussed: Current Exercise Habits: Home exercise routine, Type of exercise: walking, Time (Minutes): 20, Frequency (Times/Week): 4, Weekly Exercise (Minutes/Week): 80  Goals    . Healthy Lifestyle          STAY HYDRATED AND CONTINUE DRINKING PLENTY OF FLUIDS/WATER. STAY ACTIVE AND CONTINUE WALKING AS MUCH AS POSSIBLE. LOW CARB FOODS.  VEGETABLES, LEAN MEATS.      Depression Screen PHQ 2/9 Scores 02/11/2016 02/08/2016 11/13/2014  PHQ - 2 Score 0 0 0    Fall Risk Fall Risk  02/11/2016 02/08/2016  Falls in the past year? No No    Cognitive Function: MMSE - Mini Mental State Exam 02/11/2016  Orientation to time 5  Orientation to Place 5  Registration 3  Attention/ Calculation 5  Recall 3  Language- name 2 objects 2  Language- repeat 1  Language- follow 3 step command 3  Language- read & follow direction 1  Write a sentence 1  Copy design 1  Total score 30        Screening Tests Health Maintenance  Topic Date Due  . TETANUS/TDAP  02/09/2017 (Originally 04/23/1960)  . INFLUENZA VACCINE  Addressed  . DEXA SCAN  Addressed  . ZOSTAVAX   Addressed  . PNA vac Low Risk Adult  Addressed      Plan:    End of life planning; Advance aging; Advanced directives discussed. Copy of current HCPOA/Living Will on file.  Medicare Attestation I have personally reviewed: The patient's medical and social history Their use of alcohol, tobacco or illicit drugs Their current medications and supplements The patient's functional ability including ADLs,fall risks, home safety risks, cognitive, and hearing and visual impairment Diet and physical activities Evidence for depression   The patient's weight, height, BMI, and visual acuity have been recorded in the chart.  I have made referrals and provided education to the patient based on review of the above and I have provided the patient with a written personalized care plan for preventive services.    During the course of the visit, Luzetta  was educated and counseled about the following appropriate screening and preventive services:   Vaccines to include Pneumoccal, Influenza, Hepatitis B, Td, Zostavax, HCV  Electrocardiogram  Cardiovascular disease screening  Colorectal cancer screening  Bone density screening  Diabetes screening  Glaucoma screening  Mammography/PAP  Nutrition counseling  Smoking cessation counseling  Patient Instructions (the written plan) were given to the patient.    Varney Biles, LPN   [id number]

## 2016-02-11 NOTE — Patient Instructions (Addendum)
Theresa Reid , Thank you for taking time to come for your Medicare Wellness Visit. I appreciate your ongoing commitment to your health goals. Please review the following plan we discussed and let me know if I can assist you in the future.   FOLLOW UP WITH Theresa ARNETT, FNP AS NEEDED.  These are the goals we discussed: Goals    . Healthy Lifestyle          STAY HYDRATED AND CONTINUE DRINKING PLENTY OF FLUIDS/WATER. STAY ACTIVE AND CONTINUE WALKING AS MUCH AS POSSIBLE. LOW CARB FOODS.  VEGETABLES, LEAN MEATS.       This is a list of the screening recommended for you and due dates:  Health Maintenance  Topic Date Due  . Tetanus Vaccine  02/09/2017*  . Flu Shot  Addressed  . DEXA scan (bone density measurement)  Addressed  . Shingles Vaccine  Addressed  . Pneumonia vaccines  Addressed  *Topic was postponed. The date shown is not the original due date.   Health Maintenance, Female Adopting a healthy lifestyle and getting preventive care can go a long way to promote health and wellness. Talk with your health care provider about what schedule of regular examinations is right for you. This is a good chance for you to check in with your provider about disease prevention and staying healthy. In between checkups, there are plenty of things you can do on your own. Experts have done a lot of research about which lifestyle changes and preventive measures are most likely to keep you healthy. Ask your health care provider for more information. WEIGHT AND DIET  Eat a healthy diet  Be sure to include plenty of vegetables, fruits, low-fat dairy products, and lean protein.  Do not eat a lot of foods high in solid fats, added sugars, or salt.  Get regular exercise. This is one of the most important things you can do for your health.  Most adults should exercise for at least 150 minutes each week. The exercise should increase your heart rate and make you sweat (moderate-intensity  exercise).  Most adults should also do strengthening exercises at least twice a week. This is in addition to the moderate-intensity exercise.  Maintain a healthy weight  Body mass index (BMI) is a measurement that can be used to identify possible weight problems. It estimates body fat based on height and weight. Your health care provider can help determine your BMI and help you achieve or maintain a healthy weight.  For females 73 years of age and older:   A BMI below 18.5 is considered underweight.  A BMI of 18.5 to 24.9 is normal.  A BMI of 25 to 29.9 is considered overweight.  A BMI of 30 and above is considered obese.  Watch levels of cholesterol and blood lipids  You should start having your blood tested for lipids and cholesterol at 77 years of age, then have this test every 5 years.  You may need to have your cholesterol levels checked more often if:  Your lipid or cholesterol levels are high.  You are older than 77 years of age.  You are at high risk for heart disease.  CANCER SCREENING   Lung Cancer  Lung cancer screening is recommended for adults 37-11 years old who are at high risk for lung cancer because of a history of smoking.  A yearly low-dose CT scan of the lungs is recommended for people who:  Currently smoke.  Have quit within the past 15  years.  Have at least a 30-pack-year history of smoking. A pack year is smoking an average of one pack of cigarettes a day for 1 year.  Yearly screening should continue until it has been 15 years since you quit.  Yearly screening should stop if you develop a health problem that would prevent you from having lung cancer treatment.  Breast Cancer  Practice breast self-awareness. This means understanding how your breasts normally appear and feel.  It also means doing regular breast self-exams. Let your health care provider know about any changes, no matter how small.  If you are in your 20s or 30s, you should  have a clinical breast exam (CBE) by a health care provider every 1-3 years as part of a regular health exam.  If you are 93 or older, have a CBE every year. Also consider having a breast X-ray (mammogram) every year.  If you have a family history of breast cancer, talk to your health care provider about genetic screening.  If you are at high risk for breast cancer, talk to your health care provider about having an MRI and a mammogram every year.  Breast cancer gene (BRCA) assessment is recommended for women who have family members with BRCA-related cancers. BRCA-related cancers include:  Breast.  Ovarian.  Tubal.  Peritoneal cancers.  Results of the assessment will determine the need for genetic counseling and BRCA1 and BRCA2 testing. Cervical Cancer Your health care provider may recommend that you be screened regularly for cancer of the pelvic organs (ovaries, uterus, and vagina). This screening involves a pelvic examination, including checking for microscopic changes to the surface of your cervix (Pap test). You may be encouraged to have this screening done every 3 years, beginning at age 49.  For women ages 71-65, health care providers may recommend pelvic exams and Pap testing every 3 years, or they may recommend the Pap and pelvic exam, combined with testing for human papilloma virus (HPV), every 5 years. Some types of HPV increase your risk of cervical cancer. Testing for HPV may also be done on women of any age with unclear Pap test results.  Other health care providers may not recommend any screening for nonpregnant women who are considered low risk for pelvic cancer and who do not have symptoms. Ask your health care provider if a screening pelvic exam is right for you.  If you have had past treatment for cervical cancer or a condition that could lead to cancer, you need Pap tests and screening for cancer for at least 20 years after your treatment. If Pap tests have been  discontinued, your risk factors (such as having a new sexual partner) need to be reassessed to determine if screening should resume. Some women have medical problems that increase the chance of getting cervical cancer. In these cases, your health care provider may recommend more frequent screening and Pap tests. Colorectal Cancer  This type of cancer can be detected and often prevented.  Routine colorectal cancer screening usually begins at 77 years of age and continues through 77 years of age.  Your health care provider may recommend screening at an earlier age if you have risk factors for colon cancer.  Your health care provider may also recommend using home test kits to check for hidden blood in the stool.  A small camera at the end of a tube can be used to examine your colon directly (sigmoidoscopy or colonoscopy). This is done to check for the earliest forms of colorectal  cancer.  Routine screening usually begins at age 37.  Direct examination of the colon should be repeated every 5-10 years through 77 years of age. However, you may need to be screened more often if early forms of precancerous polyps or small growths are found. Skin Cancer  Check your skin from head to toe regularly.  Tell your health care provider about any new moles or changes in moles, especially if there is a change in a mole's shape or color.  Also tell your health care provider if you have a mole that is larger than the size of a pencil eraser.  Always use sunscreen. Apply sunscreen liberally and repeatedly throughout the day.  Protect yourself by wearing long sleeves, pants, a wide-brimmed hat, and sunglasses whenever you are outside. HEART DISEASE, DIABETES, AND HIGH BLOOD PRESSURE   High blood pressure causes heart disease and increases the risk of stroke. High blood pressure is more likely to develop in:  People who have blood pressure in the high end of the normal range (130-139/85-89 mm Hg).  People  who are overweight or obese.  People who are African American.  If you are 99-75 years of age, have your blood pressure checked every 3-5 years. If you are 26 years of age or older, have your blood pressure checked every year. You should have your blood pressure measured twice--once when you are at a hospital or clinic, and once when you are not at a hospital or clinic. Record the average of the two measurements. To check your blood pressure when you are not at a hospital or clinic, you can use:  An automated blood pressure machine at a pharmacy.  A home blood pressure monitor.  If you are between 35 years and 25 years old, ask your health care provider if you should take aspirin to prevent strokes.  Have regular diabetes screenings. This involves taking a blood sample to check your fasting blood sugar level.  If you are at a normal weight and have a low risk for diabetes, have this test once every three years after 77 years of age.  If you are overweight and have a high risk for diabetes, consider being tested at a younger age or more often. PREVENTING INFECTION  Hepatitis B  If you have a higher risk for hepatitis B, you should be screened for this virus. You are considered at high risk for hepatitis B if:  You were born in a country where hepatitis B is common. Ask your health care provider which countries are considered high risk.  Your parents were born in a high-risk country, and you have not been immunized against hepatitis B (hepatitis B vaccine).  You have HIV or AIDS.  You use needles to inject street drugs.  You live with someone who has hepatitis B.  You have had sex with someone who has hepatitis B.  You get hemodialysis treatment.  You take certain medicines for conditions, including cancer, organ transplantation, and autoimmune conditions. Hepatitis C  Blood testing is recommended for:  Everyone born from [birth year] through 1967.  Anyone with known risk factors for  hepatitis C. Sexually transmitted infections (STIs)  You should be screened for sexually transmitted infections (STIs) including gonorrhea and chlamydia if:  You are sexually active and are younger than 77 years of age.  You are older than 77 years of age and your health care provider tells you that you are at risk for this type of infection.  Your sexual activity  has changed since you were last screened and you are at an increased risk for chlamydia or gonorrhea. Ask your health care provider if you are at risk.  If you do not have HIV, but are at risk, it may be recommended that you take a prescription medicine daily to prevent HIV infection. This is called pre-exposure prophylaxis (PrEP). You are considered at risk if:  You are sexually active and do not regularly use condoms or know the HIV status of your partner(s).  You take drugs by injection.  You are sexually active with a partner who has HIV. Talk with your health care provider about whether you are at high risk of being infected with HIV. If you choose to begin PrEP, you should first be tested for HIV. You should then be tested every 3 months for as long as you are taking PrEP.  PREGNANCY   If you are premenopausal and you may become pregnant, ask your health care provider about preconception counseling.  If you may become pregnant, take 400 to 800 micrograms (mcg) of folic acid every day.  If you want to prevent pregnancy, talk to your health care provider about birth control (contraception). OSTEOPOROSIS AND MENOPAUSE   Osteoporosis is a disease in which the bones lose minerals and strength with aging. This can result in serious bone fractures. Your risk for osteoporosis can be identified using a bone density scan.  If you are 93 years of age or older, or if you are at risk for osteoporosis and fractures, ask your health care provider if you should be screened.  Ask your health care provider whether you should take a  calcium or vitamin D supplement to lower your risk for osteoporosis.  Menopause may have certain physical symptoms and risks.  Hormone replacement therapy may reduce some of these symptoms and risks. Talk to your health care provider about whether hormone replacement therapy is right for you.  HOME CARE INSTRUCTIONS   Schedule regular health, dental, and eye exams.  Stay current with your immunizations.   Do not use any tobacco products including cigarettes, chewing tobacco, or electronic cigarettes.  If you are pregnant, do not drink alcohol.  If you are breastfeeding, limit how much and how often you drink alcohol.  Limit alcohol intake to no more than 1 drink per day for nonpregnant women. One drink equals 12 ounces of beer, 5 ounces of wine, or 1 ounces of hard liquor.  Do not use street drugs.  Do not share needles.  Ask your health care provider for help if you need support or information about quitting drugs.  Tell your health care provider if you often feel depressed.  Tell your health care provider if you have ever been abused or do not feel safe at home.   This information is not intended to replace advice given to you by your health care provider. Make sure you discuss any questions you have with your health care provider.   Document Released: 10/24/2010 Document Revised: 05/01/2014 Document Reviewed: 03/12/2013 Elsevier Interactive Patient Education [employer].

## 2016-02-12 ENCOUNTER — Other Ambulatory Visit: Payer: Self-pay | Admitting: Nurse Practitioner

## 2016-02-12 ENCOUNTER — Other Ambulatory Visit: Payer: Self-pay | Admitting: Family Medicine

## 2016-02-12 DIAGNOSIS — E876 Hypokalemia: Secondary | ICD-10-CM

## 2016-02-12 DIAGNOSIS — R600 Localized edema: Secondary | ICD-10-CM

## 2016-02-12 DIAGNOSIS — T502X5A Adverse effect of carbonic-anhydrase inhibitors, benzothiadiazides and other diuretics, initial encounter: Principal | ICD-10-CM

## 2016-02-14 NOTE — Telephone Encounter (Signed)
Refilled  Amlodipine:11/15/15 Voltaren:historical Ditropan:11/15/15 K-Dur:11/15/15 Demadex:11/15/15  ** the meds refilled on 11/15/15 were by Dr.Cook. Pt last seen 02/08/16 by Vidal Schwalbe. Please advise?

## 2016-02-16 ENCOUNTER — Telehealth: Payer: Self-pay | Admitting: Family

## 2016-02-16 NOTE — Telephone Encounter (Signed)
I have refilled medications.   Please advise patient to be VERY careful if she takes her diuretic, demadex, when she has diarrhea. This can cause her K to get really low.   If she continues to have diarrhea, please order BMP and I will co sign and patient can make lab appt to ensure electrolytes stable.

## 2016-02-16 NOTE — Telephone Encounter (Signed)
Okay with me 

## 2016-02-16 NOTE — Telephone Encounter (Signed)
Pt called and was asking if she could switch providers from [facility] to Dr. Lacinda Axon. Please advise, thank you!  Call pt @ [phone]

## 2016-02-16 NOTE — Telephone Encounter (Signed)
That is fine Estill Bamberg.   Thanks for letting me know !

## 2016-02-17 ENCOUNTER — Encounter: Payer: Medicare Other | Admitting: Family

## 2016-03-14 ENCOUNTER — Ambulatory Visit: Payer: Medicare Other

## 2016-03-31 ENCOUNTER — Ambulatory Visit: Payer: Medicare Other | Admitting: Family Medicine

## 2016-04-03 ENCOUNTER — Encounter: Payer: Medicare Other | Admitting: Family

## 2016-04-13 ENCOUNTER — Ambulatory Visit (INDEPENDENT_AMBULATORY_CARE_PROVIDER_SITE_OTHER): Payer: Medicare Other | Admitting: Family Medicine

## 2016-04-13 ENCOUNTER — Encounter: Payer: Self-pay | Admitting: Family Medicine

## 2016-04-13 VITALS — BP 129/75 | HR 96 | Temp 97.6°F | Resp 14 | Wt 216.2 lb

## 2016-04-13 DIAGNOSIS — R1013 Epigastric pain: Secondary | ICD-10-CM | POA: Diagnosis not present

## 2016-04-13 DIAGNOSIS — M159 Polyosteoarthritis, unspecified: Secondary | ICD-10-CM

## 2016-04-13 DIAGNOSIS — M150 Primary generalized (osteo)arthritis: Secondary | ICD-10-CM | POA: Diagnosis not present

## 2016-04-13 DIAGNOSIS — R600 Localized edema: Secondary | ICD-10-CM

## 2016-04-13 MED ORDER — TORSEMIDE 5 MG PO TABS: 5.0000 mg | ORAL_TABLET | Freq: Every day | ORAL | 0 refills | Status: DC

## 2016-04-13 MED ORDER — AMLODIPINE BESYLATE 5 MG PO TABS: 5.0000 mg | ORAL_TABLET | Freq: Every day | ORAL | 1 refills | Status: DC

## 2016-04-13 MED ORDER — TRAMADOL HCL 50 MG PO TABS: 50.0000 mg | ORAL_TABLET | Freq: Three times a day (TID) | ORAL | 1 refills | Status: DC | PRN

## 2016-04-13 MED ORDER — PANTOPRAZOLE SODIUM 40 MG PO TBEC
40.0000 mg | DELAYED_RELEASE_TABLET | Freq: Every day | ORAL | 1 refills | Status: DC
Start: 2016-04-13 — End: 2016-08-07

## 2016-04-13 MED ORDER — OXYBUTYNIN CHLORIDE 5 MG PO TABS: 5.0000 mg | ORAL_TABLET | Freq: Two times a day (BID) | ORAL | 1 refills | Status: DC

## 2016-04-13 NOTE — Progress Notes (Signed)
Subjective:  Patient ID: Theresa Reid, female    DOB: 05-Feb-1941  Age: 75 y.o. MRN: [id number]  CC: GI issues  HPI:  75 year old female with HTN, OAB, HLD, OA presents with the above complaint.  Patient has a long-standing history of osteoarthritis. She has been on NSAIDs for many many years. She states that she's had GI issues for approximately 30 years. She has been on NSAIDs throughout this period of time. She states that recently over the past 6 months her symptoms have been worsening. She's been having epigastric abdominal pain, reflux, and bloating. She often has diarrhea as well. No known exacerbating or relieving factors. No association with food. She is on omeprazole daily and takes Cytotec in addition to NSAIDs for prevention of ulceration. No associated fevers or chills. She reports that it's intermittent and comes in waves. No other complaints or issues at this time.  Social Hx   Social History   Social History  . Marital status: Single    Spouse name: N/A  . Number of children: N/A  . Years of education: N/A   Social History Main Topics  . Smoking status: Never Smoker  . Smokeless tobacco: Never Used  . Alcohol use No  . Drug use: No  . Sexual activity: No   Other Topics Concern  . None   Social History Narrative   Retired    Lives by herself    Pets: None   Caffeine- Coffee 2 cups daily, no tea/soda         Review of Systems  Constitutional: Negative.   Gastrointestinal: Positive for abdominal pain.       GERD. Bloating.   Objective:  BP 129/75   Pulse 96   Temp 97.6 F (36.4 C) (Oral)   Resp 14   Wt 216 lb 4 oz (98.1 kg)   SpO2 93%   BMI 37.12 kg/m   BP/Weight 04/13/2016 02/11/2016 [id number]  Systolic BP Q000111Q [id number]  Diastolic BP 75 70 90  Wt. (Lbs) 216.25 216.8 215.6  BMI 37.12 37.21 35.88   Physical Exam  Constitutional: She is oriented to person, place, and time. She appears well-developed. No distress.  Cardiovascular: Normal  rate and regular rhythm.   Pulmonary/Chest: Effort normal and breath sounds normal.  Abdominal: Soft. She exhibits no distension. There is no tenderness.  Neurological: She is alert and oriented to person, place, and time.  Psychiatric: She has a normal mood and affect.  Vitals reviewed.  Lab Results  Component Value Date   WBC 10.4 02/08/2016   HGB 14.4 02/08/2016   HCT 42.6 02/08/2016   PLT 276.0 02/08/2016   GLUCOSE 86 02/08/2016   CHOL 216 (H) 02/08/2016   TRIG 190.0 (H) 02/08/2016   HDL 61.00 02/08/2016   LDLDIRECT 105.0 02/09/2015   LDLCALC 117 (H) 02/08/2016   ALT 18 02/08/2016   AST 18 02/08/2016   NA 143 02/08/2016   K 3.8 02/08/2016   CL 103 02/08/2016   CREATININE 0.76 02/08/2016   BUN 10 02/08/2016   CO2 30 02/08/2016   TSH 3.52 02/08/2016   INR 1.0 10/21/2012   Assessment & Plan:   Problem List Items Addressed This Visit    Osteoarthritis of multiple joints    Stopping NSAID's and cytotec. Starting tramadol.       Relevant Medications   traMADol (ULTRAM) 50 MG tablet   Abdominal pain, epigastric - Primary    New problem. Patient reports a long-standing history of  intermittent abdominal pain associated reflux in the setting of chronic NSAID use. Stopping NSAIDs and Cytotec. Starting tramadol for pain. Stopping omeprazole and starting Protonix. Discussed possible endoscopy if symptoms fail to improve. Patient in agreement.       Other Visit Diagnoses    Bilateral leg edema       Relevant Medications   torsemide (DEMADEX) 5 MG tablet     Meds ordered this encounter  Medications  . torsemide (DEMADEX) 5 MG tablet    Sig: Take 1 tablet (5 mg total) by mouth daily.    Dispense:  90 tablet    Refill:  0  . oxybutynin (DITROPAN) 5 MG tablet    Sig: Take 1 tablet (5 mg total) by mouth 2 (two) times daily.    Dispense:  180 tablet    Refill:  1  . amLODipine (NORVASC) 5 MG tablet    Sig: Take 1 tablet (5 mg total) by mouth daily.    Dispense:  90  tablet    Refill:  1  . traMADol (ULTRAM) 50 MG tablet    Sig: Take 1 tablet (50 mg total) by mouth every 8 (eight) hours as needed.    Dispense:  90 tablet    Refill:  1  . pantoprazole (PROTONIX) 40 MG tablet    Sig: Take 1 tablet (40 mg total) by mouth daily.    Dispense:  90 tablet    Refill:  1    Follow-up: Return in about 3 months (around 07/12/2016).  [place]

## 2016-04-13 NOTE — Assessment & Plan Note (Signed)
Stopping NSAID's and cytotec. Starting tramadol.

## 2016-04-13 NOTE — Assessment & Plan Note (Signed)
New problem. Patient reports a long-standing history of intermittent abdominal pain associated reflux in the setting of chronic NSAID use. Stopping NSAIDs and Cytotec. Starting tramadol for pain. Stopping omeprazole and starting Protonix. Discussed possible endoscopy if symptoms fail to improve. Patient in agreement.

## 2016-04-13 NOTE — Progress Notes (Signed)
Pre visit review using our clinic review tool, if applicable. No additional management support is needed unless otherwise documented below in the visit note. 

## 2016-04-13 NOTE — Patient Instructions (Addendum)
Stop the cytotec and voltaren.  Try tramadol.  Stop omeprazole. Start protonix.  Follow up in 3 months.  Merry Christmas  Dr. Lacinda Axon

## 2016-04-19 ENCOUNTER — Other Ambulatory Visit: Payer: Self-pay | Admitting: Family

## 2016-04-19 DIAGNOSIS — T502X5A Adverse effect of carbonic-anhydrase inhibitors, benzothiadiazides and other diuretics, initial encounter: Principal | ICD-10-CM

## 2016-04-19 DIAGNOSIS — E876 Hypokalemia: Secondary | ICD-10-CM

## 2016-04-25 ENCOUNTER — Other Ambulatory Visit: Payer: Self-pay | Admitting: Family Medicine

## 2016-04-25 DIAGNOSIS — E876 Hypokalemia: Secondary | ICD-10-CM

## 2016-04-25 DIAGNOSIS — T502X5A Adverse effect of carbonic-anhydrase inhibitors, benzothiadiazides and other diuretics, initial encounter: Principal | ICD-10-CM

## 2016-04-25 MED ORDER — POTASSIUM CHLORIDE CRYS ER 10 MEQ PO TBCR: 10.0000 meq | EXTENDED_RELEASE_TABLET | Freq: Every day | ORAL | 0 refills | Status: DC

## 2016-04-25 NOTE — Telephone Encounter (Signed)
Refilled 02/16/16. Pt last seen 04/13/16. Last labs 02/08/16 with a potassium of 3.8. Please advise?

## 2016-04-27 ENCOUNTER — Other Ambulatory Visit: Payer: Self-pay

## 2016-04-27 MED ORDER — ATORVASTATIN CALCIUM 80 MG PO TABS: 80.0000 mg | ORAL_TABLET | Freq: Every day | ORAL | 0 refills | Status: DC

## 2016-04-27 NOTE — Telephone Encounter (Signed)
Medication has been refilled.

## 2016-06-01 ENCOUNTER — Other Ambulatory Visit: Payer: Self-pay | Admitting: Family Medicine

## 2016-06-01 DIAGNOSIS — R600 Localized edema: Secondary | ICD-10-CM

## 2016-06-01 NOTE — Telephone Encounter (Signed)
Refilled 04/13/16. Pt last seen 04/13/16. Please advise?

## 2016-06-29 ENCOUNTER — Other Ambulatory Visit: Payer: Self-pay | Admitting: Family

## 2016-06-29 ENCOUNTER — Other Ambulatory Visit: Payer: Self-pay | Admitting: Family Medicine

## 2016-06-29 DIAGNOSIS — T502X5A Adverse effect of carbonic-anhydrase inhibitors, benzothiadiazides and other diuretics, initial encounter: Principal | ICD-10-CM

## 2016-06-29 DIAGNOSIS — E876 Hypokalemia: Secondary | ICD-10-CM

## 2016-06-30 NOTE — Telephone Encounter (Signed)
Refilled: 04/25/16 Last OV: 04/13/16 Future OV: 07/07/16 Last Labs: 02/08/16 Please advise?

## 2016-07-07 ENCOUNTER — Encounter: Payer: Self-pay | Admitting: Family Medicine

## 2016-07-07 ENCOUNTER — Ambulatory Visit (INDEPENDENT_AMBULATORY_CARE_PROVIDER_SITE_OTHER): Payer: Medicare Other | Admitting: Family Medicine

## 2016-07-07 VITALS — BP 140/80 | HR 105 | Temp 98.4°F | Wt 217.2 lb

## 2016-07-07 DIAGNOSIS — M159 Polyosteoarthritis, unspecified: Secondary | ICD-10-CM

## 2016-07-07 DIAGNOSIS — M150 Primary generalized (osteo)arthritis: Secondary | ICD-10-CM

## 2016-07-07 DIAGNOSIS — E785 Hyperlipidemia, unspecified: Secondary | ICD-10-CM | POA: Diagnosis not present

## 2016-07-07 DIAGNOSIS — E2839 Other primary ovarian failure: Secondary | ICD-10-CM

## 2016-07-07 DIAGNOSIS — I10 Essential (primary) hypertension: Secondary | ICD-10-CM | POA: Diagnosis not present

## 2016-07-07 DIAGNOSIS — R1013 Epigastric pain: Secondary | ICD-10-CM | POA: Diagnosis not present

## 2016-07-07 NOTE — Progress Notes (Signed)
Pre visit review using our clinic review tool, if applicable. No additional management support is needed unless otherwise documented below in the visit note. 

## 2016-07-07 NOTE — Assessment & Plan Note (Signed)
Now resolved off NSAIDs. Continue PPI.

## 2016-07-07 NOTE — Progress Notes (Signed)
Subjective:  Patient ID: Theresa Reid, female    DOB: [birth year]  Age: 76 y.o. MRN: [id number]  CC: Follow up  HPI:  76 year old female with hypertension, OA, hyperlipidemia presents for follow-up.  Abdominal pain  Abdominal pain is now resolved after discontinuation of NSAIDs and with dietary changes.  Hypertension  Stable amlodipine, torsemide.  Hyperlipidemia  Compliant with Lipitor.  We'll need a lipid panel later this year.  Osteoarthritis  Patient states that she is stable at this time.  She is using tramadol infrequently.  Off NSAIDs.   Social Hx   Social History   Social History  . Marital status: Single    Spouse name: N/A  . Number of children: N/A  . Years of education: N/A   Social History Main Topics  . Smoking status: Never Smoker  . Smokeless tobacco: Never Used  . Alcohol use No  . Drug use: No  . Sexual activity: No   Other Topics Concern  . None   Social History Narrative   Retired    Lives by herself    Pets: None   Caffeine- Coffee 2 cups daily, no tea/soda          Review of Systems  Constitutional: Negative.   Gastrointestinal: Negative for abdominal pain.  Musculoskeletal: Positive for arthralgias.   Objective:  BP 140/80   Pulse (!) 105   Temp 98.4 F (36.9 C) (Oral)   Wt 217 lb 4 oz (98.5 kg)   SpO2 94%   BMI 37.29 kg/m   BP/Weight 07/07/2016 04/13/2016 40/01/2724  Systolic BP 366 440 347  Diastolic BP 80 75 70  Wt. (Lbs) 217.25 216.25 216.8  BMI 37.29 37.12 37.21   Physical Exam  Constitutional: She is oriented to person, place, and time. She appears well-developed. No distress.  Cardiovascular: Normal rate and regular rhythm.   Pulmonary/Chest: Effort normal and breath sounds normal.  Abdominal: Soft. She exhibits no distension. There is no tenderness. There is no rebound and no guarding.  Neurological: She is alert and oriented to person, place, and time.  Psychiatric: She has a normal mood and  affect.  Vitals reviewed.   Lab Results  Component Value Date   WBC 10.4 02/08/2016   HGB 14.4 02/08/2016   HCT 42.6 02/08/2016   PLT 276.0 02/08/2016   GLUCOSE 86 02/08/2016   CHOL 216 (H) 02/08/2016   TRIG 190.0 (H) 02/08/2016   HDL 61.00 02/08/2016   LDLDIRECT 105.0 02/09/2015   LDLCALC 117 (H) 02/08/2016   ALT 18 02/08/2016   AST 18 02/08/2016   NA 143 02/08/2016   K 3.8 02/08/2016   CL 103 02/08/2016   CREATININE 0.76 02/08/2016   BUN 10 02/08/2016   CO2 30 02/08/2016   TSH 3.52 02/08/2016   INR 1.0 10/21/2012    Assessment & Plan:   Problem List Items Addressed This Visit    Hypertension    Stable. Continue amlodipine and torsemide.      Osteoarthritis of multiple joints    Stable. I advised her to use tramadol more frequently if needed.      HLD (hyperlipidemia)    Continue Lipitor. Will reevaluate lipids at next visit.      Epigastric abdominal pain - Primary    Now resolved off NSAIDs. Continue PPI.       Other Visit Diagnoses    Estrogen deficiency       Relevant Orders   DG BONE DENSITY (DXA)     Follow-up:  Return in about 6 months (around 01/07/2017).  [place]

## 2016-07-07 NOTE — Assessment & Plan Note (Signed)
Stable. I advised her to use tramadol more frequently if needed.

## 2016-07-07 NOTE — Patient Instructions (Signed)
Use the tramadol if needed.  We will arrange bone density scan.  I will see you in 6 months.  Take care   Dr. Lacinda Axon

## 2016-07-07 NOTE — Assessment & Plan Note (Signed)
Stable. Continue amlodipine and torsemide.

## 2016-07-07 NOTE — Assessment & Plan Note (Signed)
Continue Lipitor. Will reevaluate lipids at next visit.

## 2016-08-05 DIAGNOSIS — R300 Dysuria: Secondary | ICD-10-CM | POA: Diagnosis not present

## 2016-08-05 DIAGNOSIS — N3000 Acute cystitis without hematuria: Secondary | ICD-10-CM | POA: Diagnosis not present

## 2016-08-07 ENCOUNTER — Other Ambulatory Visit: Payer: Self-pay | Admitting: Family Medicine

## 2016-08-07 DIAGNOSIS — R600 Localized edema: Secondary | ICD-10-CM

## 2016-08-16 ENCOUNTER — Other Ambulatory Visit: Payer: Medicare Other

## 2016-08-17 ENCOUNTER — Encounter: Payer: Self-pay | Admitting: Obstetrics and Gynecology

## 2016-08-17 ENCOUNTER — Ambulatory Visit (INDEPENDENT_AMBULATORY_CARE_PROVIDER_SITE_OTHER): Payer: Medicare Other | Admitting: Obstetrics and Gynecology

## 2016-08-17 VITALS — BP 140/80 | HR 100 | Ht 65.0 in | Wt 215.0 lb

## 2016-08-17 DIAGNOSIS — N362 Urethral caruncle: Secondary | ICD-10-CM

## 2016-08-17 DIAGNOSIS — R300 Dysuria: Secondary | ICD-10-CM

## 2016-08-17 NOTE — Progress Notes (Signed)
Chief Complaint  Patient presents with  . vaginal irritation    pain after urination    HPI:      [name] is a 76 y.o. No obstetric history on file. who LMP was No LMP recorded. Patient is postmenopausal., presents today for NP problem visit. She complains of occasional dysuria at the urethra, only at night.  Sx started a couple months ago. She saw urgent care a couple wks ago and was given abx for UTI, but then culture came back neg. She cont to have intermittent sx without urinary frequency/urgency. She denies any vag sx. She is not sex active. She denies any vag dryness.   Patient Active Problem List   Diagnosis Date Noted  . Epigastric abdominal pain 04/13/2016  . HLD (hyperlipidemia) 08/13/2015  . Hypertension 11/13/2014  . 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    Family History  Problem Relation Age of Onset  . Alcohol abuse Mother   . Heart disease Father   . Diabetes Father     Social History   Social History  . Marital status: Single    Spouse name: N/A  . Number of children: N/A  . Years of education: N/A   Occupational History  . Not on file.   Social History Main Topics  . Smoking status: Never Smoker  . Smokeless tobacco: Never Used  . Alcohol use No  . Drug use: No  . Sexual activity: No   Other Topics Concern  . Not on file   Social History Narrative   Retired    Lives by herself    Pets: None   Caffeine- Coffee 2 cups daily, no tea/soda           Current Outpatient Prescriptions:  .  amLODipine (NORVASC) 5 MG tablet, TAKE 1 TABLET BY MOUTH  DAILY, Disp: 90 tablet, Rfl: 1 .  aspirin 81 MG tablet, Take by mouth., Disp: , Rfl:  .  atorvastatin (LIPITOR) 80 MG tablet, Take 1 tablet (80 mg total) by mouth daily., Disp: 90 tablet, Rfl: 0 .  CALCIUM-VITAMIN D PO, Take by mouth., Disp: , Rfl:  .  Multiple Vitamins tablet, Take by mouth., Disp: , Rfl:  .  Omega-3 Fatty Acids (FISH OIL PO), , Disp: , Rfl:  .  oxybutynin  (DITROPAN) 5 MG tablet, TAKE 1 TABLET BY MOUTH TWO  TIMES DAILY, Disp: 180 tablet, Rfl: 1 .  pantoprazole (PROTONIX) 40 MG tablet, TAKE 1 TABLET BY MOUTH  DAILY, Disp: 90 tablet, Rfl: 1 .  potassium chloride (K-DUR,KLOR-CON) 10 MEQ tablet, TAKE 1 TABLET BY MOUTH  DAILY, Disp: 90 tablet, Rfl: 0 .  torsemide (DEMADEX) 5 MG tablet, TAKE 1 TABLET BY MOUTH  DAILY, Disp: 90 tablet, Rfl: 1 .  traMADol (ULTRAM) 50 MG tablet, Take 1 tablet (50 mg total) by mouth every 8 (eight) hours as needed., Disp: 90 tablet, Rfl: 1  Review of Systems  Constitutional: Negative for fever.  Gastrointestinal: Negative for blood in stool, constipation, diarrhea, nausea and vomiting.  Genitourinary: Positive for frequency and vaginal pain. Negative for dyspareunia, dysuria, flank pain, hematuria, urgency, vaginal bleeding and vaginal discharge.  Musculoskeletal: Negative for back pain.  Skin: Negative for rash.     OBJECTIVE:   Vitals:  BP 140/80   Pulse 100   Ht 5\' 5"  (1.651 m)   Wt 215 lb (97.5 kg)   BMI 35.78 kg/m   Physical Exam  Constitutional: She is well-developed, well-nourished, and in no distress.  Genitourinary: Vulva normal. Vulva exhibits no erythema, no exudate, no lesion, no rash and no tenderness.  Genitourinary Comments: URETHRAL POLYP C/W AREA OF TENDERNESS  Vitals reviewed.    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 Try prem vag crm ext nightly for 2 wks. Sample given. If sx persist, will refer to urology for further eval.   Dysuria - Most likely related to polyp.    Return if symptoms worsen or fail to improve.  Joseangel Nettleton B. Carthel Castille, PA-C 08/17/2016 11:34 AM

## 2016-08-29 DIAGNOSIS — M7050 Other bursitis of knee, unspecified knee: Secondary | ICD-10-CM | POA: Diagnosis not present

## 2016-08-29 DIAGNOSIS — M150 Primary generalized (osteo)arthritis: Secondary | ICD-10-CM | POA: Diagnosis not present

## 2016-08-29 DIAGNOSIS — M25562 Pain in left knee: Secondary | ICD-10-CM | POA: Diagnosis not present

## 2016-08-29 DIAGNOSIS — G8929 Other chronic pain: Secondary | ICD-10-CM | POA: Diagnosis not present

## 2016-09-12 ENCOUNTER — Ambulatory Visit: Payer: Medicare Other | Admitting: Obstetrics and Gynecology

## 2016-09-14 ENCOUNTER — Encounter: Payer: Self-pay | Admitting: Family Medicine

## 2016-09-14 ENCOUNTER — Ambulatory Visit (INDEPENDENT_AMBULATORY_CARE_PROVIDER_SITE_OTHER): Payer: Medicare Other | Admitting: Family Medicine

## 2016-09-14 DIAGNOSIS — M159 Polyosteoarthritis, unspecified: Secondary | ICD-10-CM

## 2016-09-14 DIAGNOSIS — M150 Primary generalized (osteo)arthritis: Secondary | ICD-10-CM

## 2016-09-14 DIAGNOSIS — R1013 Epigastric pain: Secondary | ICD-10-CM

## 2016-09-14 DIAGNOSIS — K219 Gastro-esophageal reflux disease without esophagitis: Secondary | ICD-10-CM | POA: Insufficient documentation

## 2016-09-14 NOTE — Progress Notes (Signed)
Subjective:  Patient ID: Theresa Reid, female    DOB: 12-17-1940  Age: 76 y.o. MRN: [id number]  CC: Follow up  HPI:  76 year old female presents for follow-up regarding epigastric abdominal pain.  Pain is essentially resolved. She has had a few bouts of reflux and heartburn. She endorses compliance with pantoprazole. No hematochezia or melena. She states that she's feeling quite well. No more NSAIDs. She would like to know if she can take anything in addition to her Protonix for her reflux and heartburn at times.  Additionally, patient states that her arthritis is fairly well controlled. She is off NSAIDs due to her abdominal pain. She has any required the use of tramadol. She states that she is doing well.  Social Hx   Social History   Social History  . Marital status: Single    Spouse name: N/A  . Number of children: N/A  . Years of education: N/A   Social History Main Topics  . Smoking status: Never Smoker  . Smokeless tobacco: Never Used  . Alcohol use No  . Drug use: No  . Sexual activity: No   Other Topics Concern  . None   Social History Narrative   Retired    Lives by herself    Pets: None   Caffeine- Coffee 2 cups daily, no tea/soda          Review of Systems  Constitutional: Negative.   Gastrointestinal:       GERD.  Musculoskeletal: Positive for arthralgias.   Objective:  BP 124/80   Pulse 91   Temp 98 F (36.7 C) (Oral)   Resp 16   Ht 5\' 5"  (1.651 m)   Wt 213 lb 4 oz (96.7 kg)   SpO2 98%   BMI 35.49 kg/m   BP/Weight 09/14/2016 08/17/2016 6/72/0947  Systolic BP 096 283 662  Diastolic BP 80 80 80  Wt. (Lbs) 213.25 215 217.25  BMI 35.49 35.78 37.29    Physical Exam  Constitutional: She is oriented to person, place, and time. She appears well-developed. No distress.  Cardiovascular: Normal rate and regular rhythm.   Pulmonary/Chest: Effort normal and breath sounds normal.  Abdominal: Soft. She exhibits no distension. There is no  tenderness.  Neurological: She is alert and oriented to person, place, and time.  Psychiatric: She has a normal mood and affect.  Vitals reviewed.   Lab Results  Component Value Date   WBC 10.4 02/08/2016   HGB 14.4 02/08/2016   HCT 42.6 02/08/2016   PLT 276.0 02/08/2016   GLUCOSE 86 02/08/2016   CHOL 216 (H) 02/08/2016   TRIG 190.0 (H) 02/08/2016   HDL 61.00 02/08/2016   LDLDIRECT 105.0 02/09/2015   LDLCALC 117 (H) 02/08/2016   ALT 18 02/08/2016   AST 18 02/08/2016   NA 143 02/08/2016   K 3.8 02/08/2016   CL 103 02/08/2016   CREATININE 0.76 02/08/2016   BUN 10 02/08/2016   CO2 30 02/08/2016   TSH 3.52 02/08/2016   INR 1.0 10/21/2012    Assessment & Plan:   Problem List Items Addressed This Visit    Osteoarthritis of multiple joints    Stable at this time. Tramadol as needed.      GERD (gastroesophageal reflux disease)    Advised that she can take an extra dose of pantoprazole if needed.      Epigastric abdominal pain    Now resolved. Doing well off NSAID's and on PPI.  Follow-up: PRN  [place]

## 2016-09-14 NOTE — Assessment & Plan Note (Signed)
Stable at this time. Tramadol as needed.

## 2016-09-14 NOTE — Patient Instructions (Signed)
Increase the protonix if needed.  I will see you in sept.  Take care  Dr. Lacinda Axon

## 2016-09-14 NOTE — Assessment & Plan Note (Signed)
Now resolved. Doing well off NSAID's and on PPI.

## 2016-09-14 NOTE — Assessment & Plan Note (Signed)
Advised that she can take an extra dose of pantoprazole if needed.

## 2016-09-26 ENCOUNTER — Ambulatory Visit: Payer: Medicare Other | Admitting: Obstetrics and Gynecology

## 2016-09-27 ENCOUNTER — Ambulatory Visit: Payer: Medicare Other

## 2016-09-27 ENCOUNTER — Encounter: Payer: Self-pay | Admitting: Emergency Medicine

## 2016-09-27 ENCOUNTER — Emergency Department
Admission: EM | Admit: 2016-09-27 | Discharge: 2016-09-27 | Disposition: A | Discharge status: Home / Self Care | Payer: Medicare Other | Attending: Emergency Medicine | Admitting: Emergency Medicine

## 2016-09-27 ENCOUNTER — Ambulatory Visit: Payer: Medicare Other | Admitting: Obstetrics and Gynecology

## 2016-09-27 ENCOUNTER — Emergency Department: Payer: Medicare Other

## 2016-09-27 DIAGNOSIS — H34231 Retinal artery branch occlusion, right eye: Secondary | ICD-10-CM

## 2016-09-27 DIAGNOSIS — I10 Essential (primary) hypertension: Secondary | ICD-10-CM | POA: Insufficient documentation

## 2016-09-27 DIAGNOSIS — Z79899 Other long term (current) drug therapy: Secondary | ICD-10-CM | POA: Insufficient documentation

## 2016-09-27 DIAGNOSIS — M316 Other giant cell arteritis: Secondary | ICD-10-CM | POA: Insufficient documentation

## 2016-09-27 DIAGNOSIS — H53121 Transient visual loss, right eye: Secondary | ICD-10-CM | POA: Diagnosis not present

## 2016-09-27 DIAGNOSIS — Z7982 Long term (current) use of aspirin: Secondary | ICD-10-CM | POA: Insufficient documentation

## 2016-09-27 DIAGNOSIS — Z96653 Presence of artificial knee joint, bilateral: Secondary | ICD-10-CM | POA: Diagnosis not present

## 2016-09-27 DIAGNOSIS — H2513 Age-related nuclear cataract, bilateral: Secondary | ICD-10-CM | POA: Diagnosis not present

## 2016-09-27 DIAGNOSIS — H53131 Sudden visual loss, right eye: Secondary | ICD-10-CM | POA: Diagnosis present

## 2016-09-27 DIAGNOSIS — Z9049 Acquired absence of other specified parts of digestive tract: Secondary | ICD-10-CM | POA: Diagnosis not present

## 2016-09-27 LAB — CBC
HEMATOCRIT: 41.6 % (ref 35.0–47.0)
HEMOGLOBIN: 13.8 g/dL (ref 12.0–16.0)
MCH: 28.6 pg (ref 26.0–34.0)
MCHC: 33.3 g/dL (ref 32.0–36.0)
MCV: 86.1 fL (ref 80.0–100.0)
Platelets: 310 10*3/uL (ref 150–440)
RBC: 4.83 MIL/uL (ref 3.80–5.20)
RDW: 15.7 % — AB (ref 11.5–14.5)
WBC: 8.5 10*3/uL (ref 3.6–11.0)

## 2016-09-27 LAB — COMPREHENSIVE METABOLIC PANEL
ALBUMIN: 3.9 g/dL (ref 3.5–5.0)
ALT: 14 U/L (ref 14–54)
AST: 21 U/L (ref 15–41)
Alkaline Phosphatase: 104 U/L (ref 38–126)
Anion gap: 10 (ref 5–15)
BILIRUBIN TOTAL: 0.8 mg/dL (ref 0.3–1.2)
BUN: 10 mg/dL (ref 6–20)
CALCIUM: 9.2 mg/dL (ref 8.9–10.3)
CO2: 28 mmol/L (ref 22–32)
Chloride: 103 mmol/L (ref 101–111)
Creatinine, Ser: 0.64 mg/dL (ref 0.44–1.00)
GFR calc Af Amer: >60 mL/min (ref >60)
GLUCOSE: 113 mg/dL — AB (ref 65–99)
POTASSIUM: 3.7 mmol/L (ref 3.5–5.1)
Sodium: 141 mmol/L (ref 135–145)
TOTAL PROTEIN: 7.2 g/dL (ref 6.5–8.1)

## 2016-09-27 LAB — C-REACTIVE PROTEIN: CRP: <0.8 mg/dL (ref <1.0)

## 2016-09-27 LAB — SEDIMENTATION RATE: Sed Rate: 21 mm/hr (ref 0–30)

## 2016-09-27 MED ORDER — TETRACAINE HCL 0.5 % OP SOLN: 2.0000 [drp] | Freq: Once | OPHTHALMIC | Status: DC

## 2016-09-27 NOTE — ED Triage Notes (Signed)
Patient presents to the ED reporting a cloud coming over the top half of her vision in her right eye.  Patient is in no obvious distress at this time.  States vision was like this for approx. 2-3 minutes and episode occurred at 8:30am this morning while patient was talking on the telephone. Patient denies dizziness, weakness and confusion.

## 2016-09-27 NOTE — Discharge Instructions (Signed)
Please go directly to see Dr. Edison Pace in the [facility] today for further evaluation.  Return to the ED for any concerns.  It was a pleasure to take care of you today, and thank you for coming to our emergency department.  If you have any questions or concerns before leaving please ask the nurse to grab me and I'm more than happy to go through your aftercare instructions again.  If you were prescribed any opioid pain medication today such as Norco, Vicodin, Percocet, morphine, hydrocodone, or oxycodone please make sure you do not drive when you are taking this medication as it can alter your ability to drive safely.  If you have any concerns once you are home that you are not improving or are in fact getting worse before you can make it to your follow-up appointment, please do not hesitate to call 911 and come back for further evaluation.  Darel Hong MD  Results for orders placed or performed during the hospital encounter of 09/27/16  CBC  Result Value Ref Range   WBC 8.5 3.6 - 11.0 K/uL   RBC 4.83 3.80 - 5.20 MIL/uL   Hemoglobin 13.8 12.0 - 16.0 g/dL   HCT 41.6 35.0 - 47.0 %   MCV 86.1 80.0 - 100.0 fL   MCH 28.6 26.0 - 34.0 pg   MCHC 33.3 32.0 - 36.0 g/dL   RDW 15.7 (H) 11.5 - 14.5 %   Platelets 310 150 - 440 K/uL  Comprehensive metabolic panel  Result Value Ref Range   Sodium 141 135 - 145 mmol/L   Potassium 3.7 3.5 - 5.1 mmol/L   Chloride 103 101 - 111 mmol/L   CO2 28 22 - 32 mmol/L   Glucose, Bld 113 (H) 65 - 99 mg/dL   BUN 10 6 - 20 mg/dL   Creatinine, Ser 0.64 0.44 - 1.00 mg/dL   Calcium 9.2 8.9 - 10.3 mg/dL   Total Protein 7.2 6.5 - 8.1 g/dL   Albumin 3.9 3.5 - 5.0 g/dL   AST 21 15 - 41 U/L   ALT 14 14 - 54 U/L   Alkaline Phosphatase 104 38 - 126 U/L   Total Bilirubin 0.8 0.3 - 1.2 mg/dL   GFR calc non Af Amer >60 >60 mL/min   GFR calc Af Amer >60 >60 mL/min   Anion gap 10 5 - 15   Ct Head Wo Contrast  Result Date: 09/27/2016 CLINICAL DATA:  Transient loss of vision  right eye. EXAM: CT HEAD WITHOUT CONTRAST TECHNIQUE: Contiguous axial images were obtained from the base of the skull through the vertex without intravenous contrast. COMPARISON:  None. FINDINGS: Brain: There is mild diffuse atrophy. There is no intracranial mass, hemorrhage, extra-axial fluid collection, or midline shift. There is slight small vessel disease in the centra semiovale bilaterally. Somewhat greater small vessel disease is noted throughout much of the left extreme capsule. Elsewhere gray-white compartments appear normal. No evident acute infarct. Vascular: There is no appreciable hyperdense vessel. There is slight calcification in the carotid siphon regions bilaterally. Skull: Bony calvarium appears intact. Sinuses/Orbits: There is slight mucosal thickening in multiple ethmoid air cells bilaterally. Other visualized paranasal sinuses are clear. Orbits appear symmetric bilaterally. Other: Visualized mastoid air cells are clear. 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 No acute infarct evident. No mass, hemorrhage, or extra-axial fluid collection. Mild calcification in each carotid siphon region noted. Mild ethmoid air cell disease noted. Electronically  Signed   By: Lowella Grip III M.D.   On: 09/27/2016 09:56

## 2016-09-27 NOTE — ED Provider Notes (Signed)
[facility] Emergency Department Provider Note  ____________________________________________   First MD Initiated Contact with Patient 09/27/16 1115     (approximate)  I have reviewed the triage vital signs and the nursing notes.   HISTORY  Chief Complaint Eye Problem    HPI Theresa Reid is a 76 y.o. female who self presents to the emergency department with right superior visual field symptoms that began around 8:30 this morning. She was sitting at home and then she said suddenly she could not see on her right eye and superior portion. When she looked around the visual field cut and removed with her. It was completely painless. After about 3 minutes and resolved and has not recurred. She had no double vision or blurred vision. She's had no flashers or floaters. She has a past medical history of hypertension and dyslipidemia. She's never had a stroke or heart attack. Symptoms came on suddenly and left suddenly. Nothing seemed to make them better or worse.She has no jaw claudication. She has no pain in her head.   Past Medical History:  Diagnosis Date  . Arthritis   . Diverticulitis   . GERD (gastroesophageal reflux disease)   . Hyperlipidemia   . Hypertension     Patient Active Problem List   Diagnosis Date Noted  . GERD (gastroesophageal reflux disease) 09/14/2016  . Epigastric abdominal pain 04/13/2016  . HLD (hyperlipidemia) 08/13/2015  . Hypertension 11/13/2014  . 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    Past Surgical History:  Procedure Laterality Date  . CHOLECYSTECTOMY    . OOPHORECTOMY Right    Cyst only (NOT OVARY)  . TOTAL KNEE ARTHROPLASTY Bilateral 05/05/2010    Prior to Admission medications   Medication Sig Start Date End Date Taking? Authorizing Provider  amLODipine (NORVASC) 5 MG tablet TAKE 1 TABLET BY MOUTH  DAILY 08/07/16   Coral Spikes, DO  aspirin 81 MG tablet Take by mouth.    [provider]    atorvastatin (LIPITOR) 80 MG tablet Take 1 tablet (80 mg total) by mouth daily. 04/27/16   Burnard Hawthorne, FNP  CALCIUM-VITAMIN D PO Take by mouth.    [provider]  Multiple Vitamins tablet Take by mouth.    [provider]  Omega-3 Fatty Acids (FISH OIL PO)     [provider]  oxybutynin (DITROPAN) 5 MG tablet TAKE 1 TABLET BY MOUTH TWO  TIMES DAILY 08/07/16   Thersa Salt G, DO  pantoprazole (PROTONIX) 40 MG tablet TAKE 1 TABLET BY MOUTH  DAILY 08/07/16   Thersa Salt G, DO  potassium chloride (K-DUR,KLOR-CON) 10 MEQ tablet TAKE 1 TABLET BY MOUTH  DAILY 06/30/16   Thersa Salt G, DO  torsemide (DEMADEX) 5 MG tablet TAKE 1 TABLET BY MOUTH  DAILY 08/07/16   Coral Spikes, DO  traMADol (ULTRAM) 50 MG tablet Take 1 tablet (50 mg total) by mouth every 8 (eight) hours as needed. 04/13/16   Coral Spikes, DO    Allergies Patient has no known allergies.  Family History  Problem Relation Age of Onset  . Alcohol abuse Mother   . Heart disease Father   . Diabetes Father     Social History Social History  Substance Use Topics  . Smoking status: Never Smoker  . Smokeless tobacco: Never Used  . Alcohol use No    Review of Systems Constitutional: No fever/chills Eyes: Positive visual changes. ENT: No sore throat. Cardiovascular: Denies chest pain. Respiratory: Denies shortness  of breath. Gastrointestinal: No abdominal pain.  No nausea, no vomiting.  No diarrhea.  No constipation. Genitourinary: Negative for dysuria. Musculoskeletal: Negative for back pain. Skin: Negative for rash. Neurological: Negative for headaches, focal weakness or numbness.   ____________________________________________   PHYSICAL EXAM:  VITAL SIGNS: ED Triage Vitals  Enc Vitals Group     BP --      Pulse Rate 09/27/16 0933 88     Resp 09/27/16 0933 18     Temp 09/27/16 0933 98.3 F (36.8 C)     Temp Source 09/27/16 0933 Oral     SpO2 09/27/16 0933 95 %     Weight 09/27/16  0934 212 lb (96.2 kg)     Height 09/27/16 0934 _0  (1.651 m)     Head Circumference --      Peak Flow --      Pain Score 09/27/16 0933 0     Pain Loc --      Pain Edu? --      Excl. in [place]? --     Constitutional: Alert and oriented x 4 well appearing nontoxic no diaphoresis speaks in full, clear sentences Eyes: PERRL EOMI. Visual acuity 20/70 on the right 20/40 on the left Ultrasound shows bilateral vitreous detachment but no retinal detachment and no vitreous hemorrhage Head: Atraumatic. Nose: No congestion/rhinnorhea. Mouth/Throat: No trismus Neck: No stridor.   Cardiovascular: Normal rate, regular rhythm. Grossly normal heart sounds.  Good peripheral circulation. Respiratory: Normal respiratory effort.  No retractions. Lungs CTAB and moving good air Gastrointestinal: Soft nontender Musculoskeletal: No lower extremity edema   Neurologic:  Normal speech and language. No gross focal neurologic deficits are appreciated. Skin:  Skin is warm, dry and intact. No rash noted. Psychiatric: Mood and affect are normal. Speech and behavior are normal.    ____________________________________________   DIFFERENTIAL  Stroke, TIA, amaurosis fugax, retinal artery occlusion, giant cell arteritis ____________________________________________   LABS (all labs ordered are listed, but only abnormal results are displayed)  Labs Reviewed  CBC - Abnormal; Notable for the following:       Result Value   RDW 15.7 (*)    All other components within normal limits  COMPREHENSIVE METABOLIC PANEL - Abnormal; Notable for the following:    Glucose, Bld 113 (*)    All other components within normal limits  SEDIMENTATION RATE  C-REACTIVE PROTEIN    Unremarkable __________________________________________  EKG   ____________________________________________  RADIOLOGY  Head CT with chronic changes but no acute  disease ____________________________________________   PROCEDURES  Procedure(s) performed: no  Procedures  Critical Care performed: no  Observation: no ____________________________________________   INITIAL IMPRESSION / ASSESSMENT AND PLAN / ED COURSE  Pertinent labs & imaging results that were available during my care of the patient were reviewed by me and considered in my medical decision making (see chart for details).     The patient arrives neurologically intact and well appearing. She had a 2-3 minute episode of the superior visual field cut. Her visual acuity is 20/70 on the right and 20/40 on the left.  ----------------------------------------- 12:05 PM on 09/27/2016 -----------------------------------------  I discussed the case with Dr. Edison Pace of ophthalmology who felt that this was either a branch retinal artery occlusion that is resolved or possibly joint cell arteritis. He recommends drawing an ESR and CRP but regardless of results to be happy to see her in clinic today. At this point the patient is medically stable for outpatient management.  ____________________________________________ The patient's ESR  is within normal limits.  FINAL CLINICAL IMPRESSION(S) / ED DIAGNOSES  Final diagnoses:  Temporal arteritis ([place])  Retinal artery branch occlusion of right eye      NEW MEDICATIONS STARTED DURING THIS VISIT:  New Prescriptions   No medications on file     Note:  This document was prepared using Dragon voice recognition software and may include unintentional dictation errors.     Darel Hong, MD 09/27/16 Lurena Nida

## 2016-10-02 ENCOUNTER — Ambulatory Visit: Payer: Medicare Other | Admitting: Family Medicine

## 2016-10-03 ENCOUNTER — Ambulatory Visit (INDEPENDENT_AMBULATORY_CARE_PROVIDER_SITE_OTHER): Payer: Medicare Other | Admitting: Family Medicine

## 2016-10-03 ENCOUNTER — Encounter: Payer: Self-pay | Admitting: Family Medicine

## 2016-10-03 DIAGNOSIS — H539 Unspecified visual disturbance: Secondary | ICD-10-CM | POA: Diagnosis not present

## 2016-10-03 DIAGNOSIS — R1013 Epigastric pain: Secondary | ICD-10-CM | POA: Diagnosis not present

## 2016-10-03 NOTE — Assessment & Plan Note (Signed)
New problem. Now resolved. No evidence of temporal arteritis. Continue regular follow-up with ophthalmology.

## 2016-10-03 NOTE — Assessment & Plan Note (Signed)
Stable. Continue Protonix. Advised Tums as needed. Patient declines further workup/evaluation at this time.

## 2016-10-03 NOTE — Patient Instructions (Signed)
Continue your meds.  Follow up in 6 months.  Take care  Dr. Etoile Looman  

## 2016-10-03 NOTE — Progress Notes (Signed)
Subjective:  Patient ID: Theresa Reid, female    DOB: November 01, 1940  Age: 76 y.o. MRN: [id number]  CC: Follow up  HPI:  76 year old female with OA, GERD, hypertension presents for follow up.  Patient recently seen in the ER on 6/6 for vision loss. Physician discussed with ophthalmology who thought this may be a retinal artery occlusion versus temporal arteritis. Her ESR and CRP were negative. She has since seen ophthalmology and no abnormalities were found on her exam. She states that her vision is now normal and she is doing well.  Patient continues to have some intermittent epigastric abdominal pain. Mild in severity. Intermittent. She has had significant improvement following discontinuation of NSAIDs. She states that this happens a few times a week. However, she does not want to pursue any other intervention at this time.  Social Hx  Social History   Social History  . Marital status: Single    Spouse name: N/A  . Number of children: N/A  . Years of education: N/A   Social History Main Topics  . Smoking status: Never Smoker  . Smokeless tobacco: Never Used  . Alcohol use No  . Drug use: No  . Sexual activity: No   Other Topics Concern  . None   Social History Narrative   Retired    Lives by herself    Pets: None   Caffeine- Coffee 2 cups daily, no tea/soda          Review of Systems  Eyes: Positive for visual disturbance.  Gastrointestinal: Positive for abdominal pain.   Objective:  BP 120/80   Pulse 86   Temp 98.6 F (37 C) (Oral)   Resp 17   Wt 215 lb (97.5 kg)   SpO2 95%   BMI 35.78 kg/m   BP/Weight 10/03/2016 09/27/2016 3/47/4259  Systolic BP 563 875 643  Diastolic BP 80 72 80  Wt. (Lbs) 215 212 213.25  BMI 35.78 35.28 35.49   Physical Exam  Constitutional: She is oriented to person, place, and time. She appears well-developed. No distress.  Cardiovascular: Normal rate and regular rhythm.   Pulmonary/Chest: Effort normal and breath sounds  normal.  Abdominal: Soft. She exhibits no distension. There is no tenderness.  Neurological: She is alert and oriented to person, place, and time.  Psychiatric: She has a normal mood and affect.  Vitals reviewed.  Lab Results  Component Value Date   WBC 8.5 09/27/2016   HGB 13.8 09/27/2016   HCT 41.6 09/27/2016   PLT 310 09/27/2016   GLUCOSE 113 (H) 09/27/2016   CHOL 216 (H) 02/08/2016   TRIG 190.0 (H) 02/08/2016   HDL 61.00 02/08/2016   LDLDIRECT 105.0 02/09/2015   LDLCALC 117 (H) 02/08/2016   ALT 14 09/27/2016   AST 21 09/27/2016   NA 141 09/27/2016   K 3.7 09/27/2016   CL 103 09/27/2016   CREATININE 0.64 09/27/2016   BUN 10 09/27/2016   CO2 28 09/27/2016   TSH 3.52 02/08/2016   INR 1.0 10/21/2012    Assessment & Plan:   Problem List Items Addressed This Visit      Other   Visual disturbance    New problem. Now resolved. No evidence of temporal arteritis. Continue regular follow-up with ophthalmology.      Epigastric abdominal pain    Stable. Continue Protonix. Advised Tums as needed. Patient declines further workup/evaluation at this time.        Follow-up: 6 months  Rethel Sebek Lacinda Axon DO [facility]  [place]

## 2016-10-09 ENCOUNTER — Other Ambulatory Visit: Payer: Self-pay

## 2016-10-09 MED ORDER — ATORVASTATIN CALCIUM 80 MG PO TABS: 80.0000 mg | ORAL_TABLET | Freq: Every day | ORAL | 0 refills | Status: DC

## 2016-10-10 ENCOUNTER — Ambulatory Visit: Payer: Medicare Other | Admitting: Obstetrics and Gynecology

## 2016-10-17 ENCOUNTER — Other Ambulatory Visit: Payer: Self-pay | Admitting: Family Medicine

## 2016-10-17 ENCOUNTER — Ambulatory Visit (INDEPENDENT_AMBULATORY_CARE_PROVIDER_SITE_OTHER): Payer: Medicare Other | Admitting: Obstetrics and Gynecology

## 2016-10-17 ENCOUNTER — Encounter: Payer: Self-pay | Admitting: Obstetrics and Gynecology

## 2016-10-17 VITALS — BP 142/68 | HR 98 | Ht 65.0 in | Wt 213.0 lb

## 2016-10-17 DIAGNOSIS — N362 Urethral caruncle: Secondary | ICD-10-CM | POA: Diagnosis not present

## 2016-10-17 DIAGNOSIS — E876 Hypokalemia: Secondary | ICD-10-CM

## 2016-10-17 DIAGNOSIS — R3989 Other symptoms and signs involving the genitourinary system: Secondary | ICD-10-CM

## 2016-10-17 DIAGNOSIS — T502X5A Adverse effect of carbonic-anhydrase inhibitors, benzothiadiazides and other diuretics, initial encounter: Principal | ICD-10-CM

## 2016-10-17 NOTE — Progress Notes (Signed)
Chief Complaint  Patient presents with  . Vaginitis    follow up from few weeks ago. sharpe feeling at urethra mostly at night    HPI:      [name] is a 76 y.o. No obstetric history on file. who LMP was No LMP recorded. Patient is postmenopausal., presents today for f/u on urethral discomfort. She was seen 2 months ago for dysuria/painful twinges from the urethra specifically. Pt was diagnosed with urethral polyp and given premarin vag crm to use ext for 2 wks. Pt states that did not improve sx. She has since tried replens as a vaginal moisturizer without improvement. Sx are worse at night. She denies any other urin sx, hematuria, vag sx. She denies any aggrav/allev factors, no triggers. Not related to foods/drink. She drinks minimal caffeine without any sx change.    Patient Active Problem List   Diagnosis Date Noted  . Urethral polyp 10/17/2016  . Visual disturbance 10/03/2016  . GERD (gastroesophageal reflux disease) 09/14/2016  . Epigastric abdominal pain 04/13/2016  . HLD (hyperlipidemia) 08/13/2015  . Hypertension 11/13/2014  . 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    Family History  Problem Relation Age of Onset  . Alcohol abuse Mother   . Heart disease Father   . Diabetes Father     Social History   Social History  . Marital status: Single    Spouse name: N/A  . Number of children: N/A  . Years of education: N/A   Occupational History  . Not on file.   Social History Main Topics  . Smoking status: Never Smoker  . Smokeless tobacco: Never Used  . Alcohol use No  . Drug use: No  . Sexual activity: No   Other Topics Concern  . Not on file   Social History Narrative   Retired    Lives by herself    Pets: None   Caffeine- Coffee 2 cups daily, no tea/soda           Current Outpatient Prescriptions:  .  amLODipine (NORVASC) 5 MG tablet, TAKE 1 TABLET BY MOUTH  DAILY, Disp: 90 tablet, Rfl: 1 .  aspirin 81 MG tablet, Take by mouth.,  Disp: , Rfl:  .  atorvastatin (LIPITOR) 80 MG tablet, Take 1 tablet (80 mg total) by mouth daily., Disp: 90 tablet, Rfl: 0 .  CALCIUM-VITAMIN D PO, Take by mouth., Disp: , Rfl:  .  Multiple Vitamins tablet, Take by mouth., Disp: , Rfl:  .  Omega-3 Fatty Acids (FISH OIL PO), , Disp: , Rfl:  .  oxybutynin (DITROPAN) 5 MG tablet, TAKE 1 TABLET BY MOUTH TWO  TIMES DAILY, Disp: 180 tablet, Rfl: 1 .  pantoprazole (PROTONIX) 40 MG tablet, TAKE 1 TABLET BY MOUTH  DAILY, Disp: 90 tablet, Rfl: 1 .  potassium chloride (K-DUR,KLOR-CON) 10 MEQ tablet, TAKE 1 TABLET BY MOUTH  DAILY, Disp: 90 tablet, Rfl: 1 .  torsemide (DEMADEX) 5 MG tablet, TAKE 1 TABLET BY MOUTH  DAILY, Disp: 90 tablet, Rfl: 1 .  traMADol (ULTRAM) 50 MG tablet, Take 1 tablet (50 mg total) by mouth every 8 (eight) hours as needed., Disp: 90 tablet, Rfl: 1  Review of Systems  Constitutional: Negative for fever.  Gastrointestinal: Negative for blood in stool, constipation, diarrhea, nausea and vomiting.  Genitourinary: Positive for dysuria. Negative for dyspareunia, flank pain, frequency, hematuria, urgency, vaginal bleeding, vaginal discharge and vaginal pain.  Musculoskeletal: Negative for back pain.  Skin: Negative for rash.  OBJECTIVE:   Vitals:  BP (!) 142/68 (BP Location: Left Arm, Patient Position: Sitting, Cuff Size: Normal)   Pulse 98   Ht 5\' 5"  (1.651 m)   Wt 213 lb (96.6 kg)   BMI 35.45 kg/m   Physical Exam  Constitutional: She is well-developed, well-nourished, and in no distress.  Genitourinary: Vulva normal. Vulva exhibits no erythema, no exudate, no lesion, no rash and no tenderness.  Genitourinary Comments: URETHRA WITH POLYP; SLIGHTLY UNCOMFORTABLE TO PALPATE BUT DOESN'T TRIGGER SX  Vitals reviewed.    Assessment/Plan: Urethral polyp - No sx improvement with vag ERT. Refer to urology for further eval. - Plan: Ambulatory referral to Urology  Urethral pain - Plan: Ambulatory referral to  Urology     Return if symptoms worsen or fail to improve.  Vanessa Kampf B. Cherly Erno, PA-C 10/17/2016 3:21 PM

## 2016-11-08 ENCOUNTER — Ambulatory Visit
Admission: RE | Admit: 2016-11-08 | Discharge: 2016-11-08 | Disposition: A | Discharge status: Home / Self Care | Payer: Medicare Other | Source: Ambulatory Visit | Attending: Family Medicine | Admitting: Family Medicine

## 2016-11-08 DIAGNOSIS — M170 Bilateral primary osteoarthritis of knee: Secondary | ICD-10-CM | POA: Diagnosis not present

## 2016-11-08 DIAGNOSIS — E2839 Other primary ovarian failure: Secondary | ICD-10-CM | POA: Insufficient documentation

## 2016-11-08 DIAGNOSIS — Z13820 Encounter for screening for osteoporosis: Secondary | ICD-10-CM | POA: Insufficient documentation

## 2016-11-08 DIAGNOSIS — Z780 Asymptomatic menopausal state: Secondary | ICD-10-CM | POA: Diagnosis not present

## 2016-11-12 NOTE — Progress Notes (Signed)
11/13/2016 2:30 PM   PAW KARSTENS Aug 17, 1940 [id number]  Referring provider: Coral Spikes, DO [address] [place], [place] [address]  Chief Complaint  Patient presents with  . New Patient (Initial Visit)    Ureteral polyp/urethral pain referred by Daneil Dan    HPI: Patient is a 76 year old Caucasian female who is referred to [place] by gynecology, Elmo Putt Copland PA-C, for urethral polyp and urethral pain.  Patient is experiencing urinary frequency and dysuria.   She states it is not a "burning" with urination, but a twinge.  She states it not occur with every urination.  She states the twinge is felt in the urethral area.  The twinging did get so severe that she sought treatment in the urgent care.  She was given antibiotics, but the urine culture returned negative and the antibiotic did not provide relief.  She was then seen by Mrs. Copland and started on vaginal estrogen cream nightly for two weeks.  This has not provided relief.  She does state the twinges has diminished.  She states her symptoms started in April and have been getting somewhat better with time.  Her PVR today is 14 mL.     PMH: Past Medical History:  Diagnosis Date  . Arthritis   . Diverticulitis   . GERD (gastroesophageal reflux disease)   . Hyperlipidemia   . Hypertension     Surgical History: Past Surgical History:  Procedure Laterality Date  . CHOLECYSTECTOMY    . ovaraian cyst removal Right    Cyst only (NOT OVARY)  . TOTAL KNEE ARTHROPLASTY Bilateral 05/05/2010    Home Medications:  Allergies as of 11/13/2016   No Known Allergies     Medication List       Accurate as of 11/13/16  2:30 PM. Always use your most recent med list.          amLODipine 5 MG tablet Commonly known as:  NORVASC TAKE 1 TABLET BY MOUTH  DAILY   aspirin 81 MG tablet Take by mouth.   atorvastatin 80 MG tablet Commonly known as:  LIPITOR Take 1 tablet (80 mg total) by mouth daily.     CALCIUM-VITAMIN D PO Take by mouth.   FISH OIL PO   Multiple Vitamins tablet Take by mouth.   oxybutynin 5 MG tablet Commonly known as:  DITROPAN TAKE 1 TABLET BY MOUTH TWO  TIMES DAILY   pantoprazole 40 MG tablet Commonly known as:  PROTONIX TAKE 1 TABLET BY MOUTH  DAILY   potassium chloride 10 MEQ tablet Commonly known as:  K-DUR,KLOR-CON TAKE 1 TABLET BY MOUTH  DAILY   torsemide 5 MG tablet Commonly known as:  DEMADEX TAKE 1 TABLET BY MOUTH  DAILY   traMADol 50 MG tablet Commonly known as:  ULTRAM Take 1 tablet (50 mg total) by mouth every 8 (eight) hours as needed.       Allergies: No Known Allergies  Family History: Family History  Problem Relation Age of Onset  . Alcohol abuse Mother   . Heart disease Father   . Diabetes Father   . Kidney cancer Neg Hx   . Bladder Cancer Neg Hx     Social History:  reports that she has never smoked. She has never used smokeless tobacco. She reports that she does not drink alcohol or use drugs.  ROS: UROLOGY Frequent Urination?: Yes Hard to postpone urination?: No Burning/pain with urination?: Yes Get up at night to urinate?: No Leakage of  urine?: No Urine stream starts and stops?: No Trouble starting stream?: No Do you have to strain to urinate?: No Blood in urine?: No Urinary tract infection?: No Sexually transmitted disease?: No Injury to kidneys or bladder?: No Painful intercourse?: No Weak stream?: No Currently pregnant?: No Vaginal bleeding?: No Last menstrual period?: n  Gastrointestinal Nausea?: No Vomiting?: No Indigestion/heartburn?: Yes Diarrhea?: No Constipation?: No  Constitutional Fever: No Night sweats?: No Weight loss?: No Fatigue?: No  Skin Skin rash/lesions?: No Itching?: No  Eyes Blurred vision?: No Double vision?: No  Ears/Nose/Throat Sore throat?: No Sinus problems?: Yes  Hematologic/Lymphatic Swollen glands?: No Easy bruising?: No  Cardiovascular Leg swelling?:  No Chest pain?: No  Respiratory Cough?: Yes Shortness of breath?: No  Endocrine Excessive thirst?: No  Musculoskeletal Back pain?: No Joint pain?: Yes  Neurological Headaches?: No Dizziness?: No  Psychologic Depression?: No Anxiety?: No  Physical Exam: BP 140/81   Pulse (!) 106   Ht 5\' 5"  (1.651 m)   Wt 209 lb 6.4 oz (95 kg)   BMI 34.85 kg/m   Constitutional: Well nourished. Alert and oriented, No acute distress. HEENT: [place] AT, moist mucus membranes. Trachea midline, no masses. Cardiovascular: No clubbing, cyanosis, or edema. Respiratory: Normal respiratory effort, no increased work of breathing. GI: Abdomen is soft, non tender, non distended, no abdominal masses. Liver and spleen not palpable.  No hernias appreciated.  Stool sample for occult testing is not indicated.   GU: No CVA tenderness.  No bladder fullness or masses.  Atrophic external genitalia, normal pubic hair distribution, no lesions.  Urethral caruncle.  No urethral masses, tenderness and/or tenderness. No bladder fullness, tenderness or masses. Pale vagina mucosa, poor estrogen effect, no discharge, no lesions, good pelvic support, no cystocele or rectocele noted.  No cervical motion tenderness.  Uterus is freely mobile and non-fixed.  No adnexal/parametria masses or tenderness noted.  Anus and perineum are without rashes or lesions.    Skin: No rashes, bruises or suspicious lesions. Lymph: No cervical or inguinal adenopathy. Neurologic: Grossly intact, no focal deficits, moving all 4 extremities. Psychiatric: Normal mood and affect.  Laboratory Data: Lab Results  Component Value Date   WBC 8.5 09/27/2016   HGB 13.8 09/27/2016   HCT 41.6 09/27/2016   MCV 86.1 09/27/2016   PLT 310 09/27/2016    Lab Results  Component Value Date   CREATININE 0.64 09/27/2016    Lab Results  Component Value Date   TSH 3.52 02/08/2016       Component Value Date/Time   CHOL 216 (H) 02/08/2016 1535   CHOL 204  (H) 11/13/2014 0948   HDL 61.00 02/08/2016 1535   HDL 56 11/13/2014 0948   CHOLHDL 4 02/08/2016 1535   VLDL 38.0 02/08/2016 1535   LDLCALC 117 (H) 02/08/2016 1535   LDLCALC 106 (H) 11/13/2014 0948    Lab Results  Component Value Date   AST 21 09/27/2016   Lab Results  Component Value Date   ALT 14 09/27/2016    I have independently reviewed the labs.   Pertinent Imaging: Results for RAISA, DITTO (MRN [id number]) as of 11/13/2016 14:12  Ref. Range 11/13/2016 13:52  Scan Result Unknown 14    Assessment & Plan:    1. Urethral caruncle  - continue vaginal estrogen cream at three nights weekly  - RTC in one month for exam, UA and symptom recheck  2. Vaginal atrophy  - see above  Return in about 1 month (around 12/14/2016) for exam and  symptom recheck.  These notes generated with voice recognition software. I apologize for typographical errors.  Zara Council, [facility] [address] [place], [place] [address] [phone]

## 2016-11-13 ENCOUNTER — Encounter: Payer: Self-pay | Admitting: Urology

## 2016-11-13 ENCOUNTER — Ambulatory Visit (INDEPENDENT_AMBULATORY_CARE_PROVIDER_SITE_OTHER): Payer: Medicare Other | Admitting: Urology

## 2016-11-13 VITALS — BP 140/81 | HR 106 | Ht 65.0 in | Wt 209.4 lb

## 2016-11-13 DIAGNOSIS — N952 Postmenopausal atrophic vaginitis: Secondary | ICD-10-CM

## 2016-11-13 DIAGNOSIS — N2889 Other specified disorders of kidney and ureter: Secondary | ICD-10-CM

## 2016-11-13 DIAGNOSIS — N362 Urethral caruncle: Secondary | ICD-10-CM

## 2016-11-13 LAB — BLADDER SCAN AMB NON-IMAGING: Scan Result: 14

## 2016-11-27 ENCOUNTER — Other Ambulatory Visit: Payer: Self-pay | Admitting: Family Medicine

## 2016-12-14 ENCOUNTER — Ambulatory Visit: Payer: Medicare Other | Admitting: Urology

## 2016-12-20 NOTE — Progress Notes (Signed)
12/21/2016 2:22 PM   Mat Carne Apr 08, 1941 [id number]  Referring provider: Coral Spikes, DO [address] [place], [place] [address]  No chief complaint on file.   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  Background history Patient is a 71 year old Caucasian female who is referred to [place] by gynecology, Elmo Putt Copland PA-C, for urethral polyp and urethral pain.  Patient is experiencing urinary frequency and dysuria.   She states it is not a "burning" with urination, but a twinge.  She states it not occur with every urination.  She states the twinge is felt in the urethral area.  The twinging did get so severe that she sought treatment in the urgent care.  She was given antibiotics, but the urine culture returned negative and the antibiotic did not provide relief.  She was then seen by Mrs. Copland and started on vaginal estrogen cream nightly for two weeks.  This has not provided relief.  She does state the twinges has diminished.  She states her symptoms started in April and have been getting somewhat better with time.  Her PVR today is 14 mL.    Today, patient had to reschedule her appointment abruptly.     PMH: Past Medical History:  Diagnosis Date  . Arthritis   . Diverticulitis   . GERD (gastroesophageal reflux disease)   . Hyperlipidemia   . Hypertension     Surgical History: Past Surgical History:  Procedure Laterality Date  . CHOLECYSTECTOMY    . ovaraian cyst removal Right    Cyst only (NOT OVARY)  . TOTAL KNEE ARTHROPLASTY Bilateral 05/05/2010    Home Medications:  Allergies as of 12/21/2016   No Known Allergies     Medication List       Accurate as of 12/20/16  2:22 PM. Always use your most recent med list.          amLODipine 5 MG tablet Commonly known as:  NORVASC TAKE 1 TABLET BY MOUTH  DAILY   aspirin 81 MG tablet Take by mouth.   atorvastatin 80 MG tablet Commonly known as:   LIPITOR TAKE 1 TABLET BY MOUTH  DAILY   CALCIUM-VITAMIN D PO Take by mouth.   FISH OIL PO   Multiple Vitamins tablet Take by mouth.   oxybutynin 5 MG tablet Commonly known as:  DITROPAN TAKE 1 TABLET BY MOUTH TWO  TIMES DAILY   pantoprazole 40 MG tablet Commonly known as:  PROTONIX TAKE 1 TABLET BY MOUTH  DAILY   potassium chloride 10 MEQ tablet Commonly known as:  K-DUR,KLOR-CON TAKE 1 TABLET BY MOUTH  DAILY   torsemide 5 MG tablet Commonly known as:  DEMADEX TAKE 1 TABLET BY MOUTH  DAILY   traMADol 50 MG tablet Commonly known as:  ULTRAM Take 1 tablet (50 mg total) by mouth every 8 (eight) hours as needed.       Allergies: No Known Allergies  Family History: Family History  Problem Relation Age of Onset  . Alcohol abuse Mother   . Heart disease Father   . Diabetes Father   . Kidney cancer Neg Hx   . Bladder Cancer Neg Hx     Social History:  reports that she has never smoked. She has never used smokeless tobacco. She reports that she does not drink alcohol or use drugs.  ROS:  Physical Exam: There were no vitals taken for this visit.  Constitutional: Well nourished. Alert and oriented, No acute distress. HEENT: [place] AT, moist mucus membranes. Trachea midline, no masses. Cardiovascular: No clubbing, cyanosis, or edema. Respiratory: Normal respiratory effort, no increased work of breathing. GI: Abdomen is soft, non tender, non distended, no abdominal masses. Liver and spleen not palpable.  No hernias appreciated.  Stool sample for occult testing is not indicated.   GU: No CVA tenderness.  No bladder fullness or masses.  Atrophic external genitalia, normal pubic hair distribution, no lesions.  Urethral caruncle.  No urethral masses, tenderness and/or tenderness. No bladder fullness, tenderness or masses. Pale vagina mucosa, poor estrogen effect, no discharge, no lesions, good pelvic support, no  cystocele or rectocele noted.  No cervical motion tenderness.  Uterus is freely mobile and non-fixed.  No adnexal/parametria masses or tenderness noted.  Anus and perineum are without rashes or lesions.    Skin: No rashes, bruises or suspicious lesions. Lymph: No cervical or inguinal adenopathy. Neurologic: Grossly intact, no focal deficits, moving all 4 extremities. Psychiatric: Normal mood and affect.  Laboratory Data: Lab Results  Component Value Date   WBC 8.5 09/27/2016   HGB 13.8 09/27/2016   HCT 41.6 09/27/2016   MCV 86.1 09/27/2016   PLT 310 09/27/2016    Lab Results  Component Value Date   CREATININE 0.64 09/27/2016    Lab Results  Component Value Date   TSH 3.52 02/08/2016       Component Value Date/Time   CHOL 216 (H) 02/08/2016 1535   CHOL 204 (H) 11/13/2014 0948   HDL 61.00 02/08/2016 1535   HDL 56 11/13/2014 0948   CHOLHDL 4 02/08/2016 1535   VLDL 38.0 02/08/2016 1535   LDLCALC 117 (H) 02/08/2016 1535   LDLCALC 106 (H) 11/13/2014 0948    Lab Results  Component Value Date   AST 21 09/27/2016   Lab Results  Component Value Date   ALT 14 09/27/2016    I have reviewed the labs.   Assessment & Plan:    1. Urethral caruncle  -  2. Vaginal atrophy  -   No Follow-up on file.  These notes generated with voice recognition software. I apologize for typographical errors.  Zara Council, [facility] [address] [place], [place] [address] [phone]

## 2016-12-21 ENCOUNTER — Ambulatory Visit: Payer: Medicare Other | Admitting: Urology

## 2016-12-21 ENCOUNTER — Ambulatory Visit (INDEPENDENT_AMBULATORY_CARE_PROVIDER_SITE_OTHER): Payer: Medicare Other | Admitting: Urology

## 2016-12-21 DIAGNOSIS — N952 Postmenopausal atrophic vaginitis: Secondary | ICD-10-CM

## 2016-12-23 NOTE — Progress Notes (Signed)
12/26/2016 3:26 PM   Theresa Reid [birth year] [id number]  Referring provider: Coral Spikes, DO [address] [place], [place] [address]  Chief Complaint  Patient presents with  . 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  Background history Patient is a 65 year old Caucasian female who is referred to [place] by gynecology, Elmo Putt Copland PA-C, for urethral polyp and urethral pain.  Patient is experiencing urinary frequency and dysuria.   She states it is not a "burning" with urination, but a twinge.  She states it not occur with every urination.  She states the twinge is felt in the urethral area.  The twinges did get so severe that she sought treatment in the urgent care.  She was given antibiotics, but the urine culture returned negative and the antibiotic did not provide relief.  She was then seen by Mrs. Copland and started on vaginal estrogen cream nightly for two weeks.  This has not provided relief.  She does state the twinges has diminished.  She states her symptoms started in April and have been getting somewhat better with time.  Her PVR today is 14 mL.    Today, she is having frequency and nocturia.  She also states that she is still having that "twinge."  It is not present during the day, but it begins during the afternoon and gets worse as the night progresses.  She has found placing a cotton ball that has been soaked in cold water eases the twinges for a few minutes.    She has been using vaginal estrogen cream three nights weekly.    Her UA today Is positive for 11:30 WBCs, many bacteria and nitrite positive.     PMH: Past Medical History:  Diagnosis Date  . Arthritis   . Diverticulitis   . GERD (gastroesophageal reflux disease)   . Hyperlipidemia   . Hypertension     Surgical History: Past Surgical History:  Procedure Laterality Date  .  CHOLECYSTECTOMY    . ovaraian cyst removal Right    Cyst only (NOT OVARY)  . TOTAL KNEE ARTHROPLASTY Bilateral 05/05/2010    Home Medications:  Allergies as of 12/26/2016   No Known Allergies     Medication List       Accurate as of 12/26/16  3:26 PM. Always use your most recent med list.          amLODipine 5 MG tablet Commonly known as:  NORVASC TAKE 1 TABLET BY MOUTH  DAILY   aspirin 81 MG tablet Take by mouth.   atorvastatin 80 MG tablet Commonly known as:  LIPITOR TAKE 1 TABLET BY MOUTH  DAILY   CALCIUM-VITAMIN D PO Take by mouth.   FISH OIL PO   Multiple Vitamins tablet Take by mouth.   oxybutynin 5 MG tablet Commonly known as:  DITROPAN TAKE 1 TABLET BY MOUTH TWO  TIMES DAILY   pantoprazole 40 MG tablet Commonly known as:  PROTONIX TAKE 1 TABLET BY MOUTH  DAILY   potassium chloride 10 MEQ tablet Commonly known as:  K-DUR,KLOR-CON TAKE 1 TABLET BY MOUTH  DAILY   torsemide 5 MG tablet Commonly known as:  DEMADEX TAKE 1 TABLET BY MOUTH  DAILY   traMADol 50 MG tablet Commonly known as:  ULTRAM Take 1 tablet (50 mg total) by mouth every 8 (  eight) hours as needed.            Discharge Care Instructions        Start     Ordered   12/26/16 0000  Urinalysis, Complete     12/26/16 1348   12/26/16 0000  CULTURE, URINE COMPREHENSIVE     12/26/16 1523      Allergies: No Known Allergies  Family History: Family History  Problem Relation Age of Onset  . Alcohol abuse Mother   . Heart disease Father   . Diabetes Father   . Kidney cancer Neg Hx   . Bladder Cancer Neg Hx     Social History:  reports that she has never smoked. She has never used smokeless tobacco. She reports that she does not drink alcohol or use drugs.  ROS: UROLOGY Frequent Urination?: Yes Hard to postpone urination?: No Burning/pain with urination?: No Get up at night to urinate?: Yes Leakage of urine?: No Urine stream starts and stops?: No Trouble starting stream?:  No Do you have to strain to urinate?: No Blood in urine?: No Urinary tract infection?: No Sexually transmitted disease?: No Injury to kidneys or bladder?: No Painful intercourse?: No Weak stream?: No Currently pregnant?: No Vaginal bleeding?: No Last menstrual period?: n  Gastrointestinal Nausea?: No Vomiting?: No Indigestion/heartburn?: No Diarrhea?: No Constipation?: No  Constitutional Fever: No Night sweats?: No Weight loss?: No Fatigue?: No  Skin Skin rash/lesions?: No Itching?: No  Eyes Blurred vision?: No Double vision?: No  Ears/Nose/Throat Sore throat?: No Sinus problems?: No  Hematologic/Lymphatic Swollen glands?: No Easy bruising?: No  Cardiovascular Leg swelling?: No Chest pain?: No  Respiratory Cough?: No Shortness of breath?: No  Endocrine Excessive thirst?: No  Musculoskeletal Back pain?: No Joint pain?: No  Neurological Headaches?: No Dizziness?: No  Psychologic Depression?: No Anxiety?: No  Physical Exam: BP (!) 142/77   Pulse (!) 112   Ht 5\' 5"  (1.651 m)   Wt 209 lb 9.6 oz (95.1 kg)   BMI 34.88 kg/m   Constitutional: Well nourished. Alert and oriented, No acute distress. HEENT: [place] AT, moist mucus membranes. Trachea midline, no masses. Cardiovascular: No clubbing, cyanosis, or edema. Respiratory: Normal respiratory effort, no increased work of breathing. GI: Abdomen is soft, non tender, non distended, no abdominal masses. Liver and spleen not palpable.  No hernias appreciated.  Stool sample for occult testing is not indicated.   GU: No CVA tenderness.  No bladder fullness or masses.  Atrophic external genitalia, normal pubic hair distribution, no lesions.  Urethral caruncle.  No urethral masses, tenderness and/or tenderness. No bladder fullness, tenderness or masses. Pale vagina mucosa, poor estrogen effect, no discharge, no lesions, good pelvic support, no cystocele or rectocele noted.  No cervical motion tenderness.   Uterus is freely mobile and non-fixed.  No adnexal/parametria masses or tenderness noted.  Anus and perineum are without rashes or lesions.    Skin: No rashes, bruises or suspicious lesions. Lymph: No cervical or inguinal adenopathy. Neurologic: Grossly intact, no focal deficits, moving all 4 extremities. Psychiatric: Normal mood and affect.  Laboratory Data: Lab Results  Component Value Date   WBC 8.5 09/27/2016   HGB 13.8 09/27/2016   HCT 41.6 09/27/2016   MCV 86.1 09/27/2016   PLT 310 09/27/2016    Lab Results  Component Value Date   CREATININE 0.64 09/27/2016    Lab Results  Component Value Date   TSH 3.52 02/08/2016       Component Value Date/Time   CHOL  216 (H) 02/08/2016 1535   CHOL 204 (H) 11/13/2014 0948   HDL 61.00 02/08/2016 1535   HDL 56 11/13/2014 0948   CHOLHDL 4 02/08/2016 1535   VLDL 38.0 02/08/2016 1535   LDLCALC 117 (H) 02/08/2016 1535   LDLCALC 106 (H) 11/13/2014 0948    Lab Results  Component Value Date   AST 21 09/27/2016   Lab Results  Component Value Date   ALT 14 09/27/2016    I have reviewed the labs.   Assessment & Plan:    1. Urethral caruncle  - continue vaginal estrogen cream at three nights weekly  - RTC in one year for exam  2. Vaginal atrophy  - see above  3. Urethral irritation  - UA is suspicious for infection we'll send for culture  - Urine culture  - Await for culture and sensitivities to prescribe an antibiotic    Return for pending culture results.  These notes generated with voice recognition software. I apologize for typographical errors.  Zara Council, [facility] [address] [place], [place] [address] [phone]

## 2016-12-26 ENCOUNTER — Ambulatory Visit: Payer: Medicare Other | Admitting: Urology

## 2016-12-26 ENCOUNTER — Encounter: Payer: Self-pay | Admitting: Urology

## 2016-12-26 VITALS — BP 142/77 | HR 112 | Ht 65.0 in | Wt 209.6 lb

## 2016-12-26 DIAGNOSIS — N952 Postmenopausal atrophic vaginitis: Secondary | ICD-10-CM | POA: Diagnosis not present

## 2016-12-26 DIAGNOSIS — N362 Urethral caruncle: Secondary | ICD-10-CM | POA: Diagnosis not present

## 2016-12-26 DIAGNOSIS — N368 Other specified disorders of urethra: Secondary | ICD-10-CM

## 2016-12-27 LAB — URINALYSIS, COMPLETE
Bilirubin, UA: NEGATIVE
GLUCOSE, UA: NEGATIVE
Ketones, UA: NEGATIVE
NITRITE UA: POSITIVE — AB
PH UA: 6 (ref 5.0–7.5)
Protein, UA: NEGATIVE
Specific Gravity, UA: 1.01 (ref 1.005–1.030)
UUROB: 0.2 mg/dL (ref 0.2–1.0)

## 2016-12-27 LAB — MICROSCOPIC EXAMINATION: RBC MICROSCOPIC, UA: NONE SEEN /HPF (ref 0–?)

## 2016-12-28 DIAGNOSIS — M159 Polyosteoarthritis, unspecified: Secondary | ICD-10-CM | POA: Diagnosis not present

## 2016-12-28 DIAGNOSIS — M353 Polymyalgia rheumatica: Secondary | ICD-10-CM | POA: Diagnosis not present

## 2016-12-29 ENCOUNTER — Other Ambulatory Visit: Payer: Self-pay | Admitting: Urology

## 2016-12-29 ENCOUNTER — Telehealth: Payer: Self-pay | Admitting: Urology

## 2016-12-29 LAB — CULTURE, URINE COMPREHENSIVE

## 2016-12-29 MED ORDER — NITROFURANTOIN MONOHYD MACRO 100 MG PO CAPS: 100.0000 mg | ORAL_CAPSULE | Freq: Two times a day (BID) | ORAL | 0 refills | Status: DC

## 2016-12-29 NOTE — Telephone Encounter (Signed)
Patient called into the office to get results from urine culture.  I advised her of the positive urine culture result and that a prescription for Macrobid has been called into [facility].  She expressed understanding.

## 2017-01-01 ENCOUNTER — Ambulatory Visit
Admission: RE | Admit: 2017-01-01 | Discharge: 2017-01-01 | Disposition: A | Discharge status: Home / Self Care | Payer: Medicare Other | Source: Ambulatory Visit | Attending: Specialist | Admitting: Specialist

## 2017-01-01 ENCOUNTER — Other Ambulatory Visit: Payer: Self-pay | Admitting: Specialist

## 2017-01-01 DIAGNOSIS — M19012 Primary osteoarthritis, left shoulder: Secondary | ICD-10-CM | POA: Diagnosis not present

## 2017-01-01 DIAGNOSIS — M159 Polyosteoarthritis, unspecified: Secondary | ICD-10-CM | POA: Insufficient documentation

## 2017-01-01 DIAGNOSIS — M19011 Primary osteoarthritis, right shoulder: Secondary | ICD-10-CM | POA: Diagnosis not present

## 2017-01-01 DIAGNOSIS — M158 Other polyosteoarthritis: Secondary | ICD-10-CM

## 2017-01-01 DIAGNOSIS — M353 Polymyalgia rheumatica: Secondary | ICD-10-CM | POA: Diagnosis not present

## 2017-01-01 DIAGNOSIS — M25552 Pain in left hip: Secondary | ICD-10-CM | POA: Diagnosis not present

## 2017-01-01 DIAGNOSIS — M25551 Pain in right hip: Secondary | ICD-10-CM | POA: Diagnosis not present

## 2017-01-05 ENCOUNTER — Encounter: Payer: Self-pay | Admitting: Urology

## 2017-01-05 ENCOUNTER — Telehealth: Payer: Self-pay | Admitting: Urology

## 2017-01-05 NOTE — Telephone Encounter (Signed)
Please have Theresa Reid call us back if she does not feel improvement after completing her antibiotic.

## 2017-01-05 NOTE — Telephone Encounter (Signed)
Called patient and gave message patient states she still has some anitbiotic left but she does feel improvement. I let her know that in a couple of days after finishing the abx if she starts to feel worse to call office back. Patient ok with plan.

## 2017-01-08 ENCOUNTER — Ambulatory Visit: Payer: Medicare Other | Admitting: Family Medicine

## 2017-01-09 ENCOUNTER — Telehealth: Payer: Self-pay | Admitting: Urology

## 2017-01-09 NOTE — Telephone Encounter (Signed)
Patient called in today and stated that her pain has returned and wants to know what she should do?   Theresa Reid

## 2017-01-09 NOTE — Telephone Encounter (Signed)
We need to CATH her again for an UA and possible urine culture.

## 2017-01-09 NOTE — Telephone Encounter (Signed)
Patient is coming Technical sales engineer

## 2017-01-10 ENCOUNTER — Ambulatory Visit: Payer: Medicare Other

## 2017-01-16 ENCOUNTER — Ambulatory Visit: Payer: Medicare Other

## 2017-01-17 ENCOUNTER — Ambulatory Visit: Payer: Medicare Other

## 2017-01-27 ENCOUNTER — Other Ambulatory Visit: Payer: Self-pay | Admitting: Family Medicine

## 2017-01-27 DIAGNOSIS — R600 Localized edema: Secondary | ICD-10-CM

## 2017-01-29 NOTE — Telephone Encounter (Signed)
Patients last OV was 10/03/16, Last Refills was April and June time frames, please advise, thanks

## 2017-01-29 NOTE — Telephone Encounter (Signed)
Please find out what she takes the oxybutynin for. I do not see where this has been discussed recently. Thanks.

## 2017-01-31 NOTE — Telephone Encounter (Signed)
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  She believes that Dr. Lacinda Axon had filled it in the past for her.  Thanks Please advise,

## 2017-01-31 NOTE — Telephone Encounter (Signed)
Sent to pharmacy 

## 2017-02-15 ENCOUNTER — Ambulatory Visit: Payer: Medicare Other | Admitting: Urology

## 2017-02-19 DIAGNOSIS — G453 Amaurosis fugax: Secondary | ICD-10-CM | POA: Diagnosis not present

## 2017-02-19 DIAGNOSIS — R300 Dysuria: Secondary | ICD-10-CM

## 2017-02-19 HISTORY — DX: Dysuria: R30.0

## 2017-02-20 ENCOUNTER — Ambulatory Visit: Payer: Medicare Other | Admitting: Urology

## 2017-03-14 ENCOUNTER — Ambulatory Visit: Payer: Medicare Other

## 2017-03-21 ENCOUNTER — Ambulatory Visit (INDEPENDENT_AMBULATORY_CARE_PROVIDER_SITE_OTHER): Payer: Medicare Other

## 2017-03-21 VITALS — BP 139/83 | HR 96 | Ht 65.0 in | Wt 205.0 lb

## 2017-03-21 DIAGNOSIS — R350 Frequency of micturition: Secondary | ICD-10-CM | POA: Diagnosis not present

## 2017-03-21 LAB — URINALYSIS, COMPLETE
BILIRUBIN UA: NEGATIVE
Glucose, UA: NEGATIVE
KETONES UA: NEGATIVE
NITRITE UA: NEGATIVE
PH UA: 6 (ref 5.0–7.5)
Protein, UA: NEGATIVE
RBC UA: NEGATIVE
Urobilinogen, Ur: 0.2 mg/dL (ref 0.2–1.0)

## 2017-03-21 LAB — MICROSCOPIC EXAMINATION: RBC MICROSCOPIC, UA: NONE SEEN /HPF (ref 0–?)

## 2017-03-21 NOTE — Progress Notes (Signed)
Pt presents today with c/o feeling as though she has a UTI. Pt refused to have a cath specimen. Pt stated that during the day she does not experience any UTI s/s. Pt states that when she first lays down at night she has extremely painful urges to go to the bathroom and only dribbles. Pt states that this sensation last for about 3hrs and then goes away. Clean catch was obtained for u/a and cx.   Blood pressure 139/83, pulse 96, height 5\' 5"  (1.651 m), weight 205 lb (93 kg).

## 2017-03-23 ENCOUNTER — Telehealth: Payer: Self-pay

## 2017-03-23 MED ORDER — CEPHALEXIN 500 MG PO CAPS: 500.0000 mg | ORAL_CAPSULE | Freq: Two times a day (BID) | ORAL | 0 refills | Status: DC

## 2017-03-23 NOTE — Telephone Encounter (Signed)
-----   Message from Nori Riis, PA-C sent at 03/23/2017  9:50 AM EST ----- Please let Mrs. Merlin know that her preliminary report is positive for an UTI.  I would like her to start Keflex 500 mg bid for seven days.

## 2017-03-23 NOTE — Telephone Encounter (Signed)
Spoke with pt in reference ucx and needing an abx. Pt voiced understanding. Abx sent to pharmacy.

## 2017-03-24 LAB — CULTURE, URINE COMPREHENSIVE

## 2017-03-26 ENCOUNTER — Ambulatory Visit (INDEPENDENT_AMBULATORY_CARE_PROVIDER_SITE_OTHER): Payer: Medicare Other | Admitting: Family Medicine

## 2017-03-26 ENCOUNTER — Telehealth: Payer: Self-pay

## 2017-03-26 DIAGNOSIS — R300 Dysuria: Secondary | ICD-10-CM

## 2017-03-26 NOTE — Telephone Encounter (Signed)
Spoke with pt in reference to +ucx results. Made aware on correct abx. Pt voiced understanding.

## 2017-03-26 NOTE — Progress Notes (Signed)
Pre visit review using our clinic review tool, if applicable. No additional management support is needed unless otherwise documented below in the visit note. 

## 2017-03-26 NOTE — Telephone Encounter (Signed)
-----   Message from Nori Riis, PA-C sent at 03/25/2017  3:45 PM EST ----- Please let Mrs. [place] know that her urine culture was positive.  The Keflex that was prescribed is the appropriate antibiotic and she should take all the tablets.

## 2017-04-03 ENCOUNTER — Other Ambulatory Visit: Payer: Self-pay | Admitting: Family Medicine

## 2017-04-03 DIAGNOSIS — T502X5A Adverse effect of carbonic-anhydrase inhibitors, benzothiadiazides and other diuretics, initial encounter: Principal | ICD-10-CM

## 2017-04-03 DIAGNOSIS — E876 Hypokalemia: Secondary | ICD-10-CM

## 2017-04-04 ENCOUNTER — Ambulatory Visit: Payer: Medicare Other | Admitting: Physician Assistant

## 2017-04-05 ENCOUNTER — Other Ambulatory Visit: Payer: Self-pay

## 2017-04-05 DIAGNOSIS — T502X5A Adverse effect of carbonic-anhydrase inhibitors, benzothiadiazides and other diuretics, initial encounter: Principal | ICD-10-CM

## 2017-04-05 DIAGNOSIS — E876 Hypokalemia: Secondary | ICD-10-CM

## 2017-04-05 MED ORDER — POTASSIUM CHLORIDE CRYS ER 10 MEQ PO TBCR: 10.0000 meq | EXTENDED_RELEASE_TABLET | Freq: Every day | ORAL | 1 refills | Status: DC

## 2017-04-05 NOTE — Telephone Encounter (Signed)
Last OV 03/26/17 with Delano Metz last filled by DR.Cook 10/17/16 90 1rf

## 2017-04-10 ENCOUNTER — Ambulatory Visit: Payer: Medicare Other | Admitting: Family Medicine

## 2017-04-10 ENCOUNTER — Encounter: Payer: Self-pay | Admitting: Family Medicine

## 2017-04-10 ENCOUNTER — Ambulatory Visit (INDEPENDENT_AMBULATORY_CARE_PROVIDER_SITE_OTHER): Payer: Medicare Other | Admitting: Family Medicine

## 2017-04-10 ENCOUNTER — Other Ambulatory Visit: Payer: Self-pay

## 2017-04-10 VITALS — BP 138/80 | HR 86 | Temp 97.9°F | Wt 203.6 lb

## 2017-04-10 DIAGNOSIS — I10 Essential (primary) hypertension: Secondary | ICD-10-CM | POA: Diagnosis not present

## 2017-04-10 DIAGNOSIS — R1013 Epigastric pain: Secondary | ICD-10-CM

## 2017-04-10 DIAGNOSIS — R300 Dysuria: Secondary | ICD-10-CM

## 2017-04-10 DIAGNOSIS — K219 Gastro-esophageal reflux disease without esophagitis: Secondary | ICD-10-CM

## 2017-04-10 DIAGNOSIS — D229 Melanocytic nevi, unspecified: Secondary | ICD-10-CM

## 2017-04-10 DIAGNOSIS — R748 Abnormal levels of other serum enzymes: Secondary | ICD-10-CM | POA: Diagnosis not present

## 2017-04-10 DIAGNOSIS — M150 Primary generalized (osteo)arthritis: Secondary | ICD-10-CM | POA: Diagnosis not present

## 2017-04-10 DIAGNOSIS — E785 Hyperlipidemia, unspecified: Secondary | ICD-10-CM

## 2017-04-10 DIAGNOSIS — L821 Other seborrheic keratosis: Secondary | ICD-10-CM | POA: Insufficient documentation

## 2017-04-10 DIAGNOSIS — M159 Polyosteoarthritis, unspecified: Secondary | ICD-10-CM

## 2017-04-10 NOTE — Assessment & Plan Note (Signed)
Well-controlled.  Continue Protonix. 

## 2017-04-10 NOTE — Progress Notes (Signed)
Tommi Rumps, MD Phone: [phone]  Theresa Reid is a 76 y.o. female who presents today for follow-up.  Hypertension: Not checking at home.  She is taking amlodipine and torsemide.  No chest pain or shortness of breath.  Has chronic left ankle edema that is unchanged over many years.  Hyperlipidemia: Taking Lipitor.  No myalgias or right upper quadrant pain.  GERD: She has been on Protonix.  She had been having epigastric pain and it was felt to likely be related to the NSAID she had been taking.  She has discontinued the NSAIDs and the abdominal pain has gone away.  Rarely gets GERD symptoms.  Takes occasional Tums.  No blood in her stool.  Recently treated for an E. coli UTI with Keflex.  Notes symptoms have resolved following this.  She is taking tumeric with pepper for her osteoarthritis.  She does follow with rheumatology.  She reports she has had a mole on her right cheek for several years.  It did start off small and grew though has not grown in some time.  Does occasionally get irritated.  Social History   Tobacco Use  Smoking Status Never Smoker  Smokeless Tobacco Never Used     ROS see history of present illness  Objective  Physical Exam Vitals:   04/10/17 1321 04/10/17 1358  BP: (!) 150/90 138/80  Pulse: 86   Temp: 97.9 F (36.6 C)   SpO2: 92%     BP Readings from Last 3 Encounters:  04/10/17 138/80  03/21/17 139/83  12/26/16 (!) 142/77   Wt Readings from Last 3 Encounters:  04/10/17 203 lb 9.6 oz (92.4 kg)  03/21/17 205 lb (93 kg)  12/26/16 209 lb 9.6 oz (95.1 kg)    Physical Exam  Constitutional: No distress.  HENT:  Head:    Cardiovascular: Normal rate, regular rhythm and normal heart sounds.  Pulmonary/Chest: Effort normal and breath sounds normal.  Abdominal: Soft. Bowel sounds are normal. She exhibits no distension. There is no tenderness.  Musculoskeletal: She exhibits no edema.  Neurological: She is alert. Gait normal.  Skin:  Skin is warm and dry. She is not diaphoretic.     Assessment/Plan: Please see individual problem list.  Hypertension Improved on recheck.  Continue current regimen.  GERD (gastroesophageal reflux disease) Well-controlled.  Continue Protonix.  Osteoarthritis of multiple joints She will continue to follow with rheumatology.  Continue tumeric.  Dysuria Resolved with treatment for UTI.  Monitor for recurrence.  Epigastric abdominal pain Resolved with Protonix.  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  Continue Protonix.  Monitor for recurrence.  HLD (hyperlipidemia) Plan for lipid panel with next set of labs.  Elevated alkaline phosphatase level Intermittently elevated for some time now.  Appears to been relatively stable.  Discussed further evaluation with GGT and repeat labs and possible referral to GI or endocrinology based on findings.  She declined this and opted to continue to monitor.  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   Brunella was seen today for establish care.  Diagnoses and all orders for this visit:  Atypical nevus -     Ambulatory referral to Dermatology  Essential hypertension  Gastroesophageal reflux disease without esophagitis  Primary osteoarthritis involving multiple joints  Dysuria  Epigastric abdominal pain  Hyperlipidemia, unspecified hyperlipidemia type  Elevated alkaline phosphatase level    Orders Placed This Encounter  Procedures  . Ambulatory referral to Dermatology  Referral Priority:   Routine    Referral Type:   Consultation    Referral Reason:   Specialty Services Required    Requested Specialty:   Dermatology    Number of Visits Requested:   1    No orders of the defined types were placed in this encounter.    Tommi Rumps, MD [place]

## 2017-04-10 NOTE — Assessment & Plan Note (Signed)
Plan for lipid panel with next set of labs.

## 2017-04-10 NOTE — Assessment & Plan Note (Signed)
Improved on recheck.  Continue current regimen. 

## 2017-04-10 NOTE — Assessment & Plan Note (Signed)
Resolved with treatment for UTI.  Monitor for recurrence.

## 2017-04-10 NOTE — Assessment & Plan Note (Signed)
Likely benign given that this is been unchanged for several years though we will refer to dermatology for further evaluation.

## 2017-04-10 NOTE — Patient Instructions (Signed)
Nice to meet you. We will get you to see dermatology. Please continue your current blood pressure regimen. If you have recurrence of urine symptoms please let us know. I will see you back in 3 months.

## 2017-04-10 NOTE — Assessment & Plan Note (Signed)
Intermittently elevated for some time now.  Appears to been relatively stable.  Discussed further evaluation with GGT and repeat labs and possible referral to GI or endocrinology based on findings.  She declined this and opted to continue to monitor.

## 2017-04-10 NOTE — Assessment & Plan Note (Signed)
She will continue to follow with rheumatology.  Continue tumeric.

## 2017-04-10 NOTE — Assessment & Plan Note (Signed)
Resolved with Protonix.  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  Continue Protonix.  Monitor for recurrence.

## 2017-04-25 ENCOUNTER — Ambulatory Visit (INDEPENDENT_AMBULATORY_CARE_PROVIDER_SITE_OTHER): Payer: Medicare Other

## 2017-04-25 VITALS — BP 128/72 | HR 76 | Temp 98.1°F | Resp 14 | Ht 65.0 in | Wt 202.1 lb

## 2017-04-25 DIAGNOSIS — Z0000 Encounter for general adult medical examination without abnormal findings: Secondary | ICD-10-CM

## 2017-04-25 NOTE — Progress Notes (Signed)
Subjective:   SINIYAH Reid is a 76 y.o. female who presents for Medicare Annual (Subsequent) preventive examination.  Review of Systems:  No ROS.  Medicare Wellness Visit. Additional risk factors are reflected in the social history.  Cardiac Risk Factors include: advanced age (>80men, >31 women);hypertension     Objective:     Vitals: BP 128/72 (BP Location: Left Arm, Patient Position: Sitting, Cuff Size: Normal)   Pulse 76   Temp 98.1 F (36.7 C) (Oral)   Resp 14   Ht 5\' 5"  (1.651 m)   Wt 202 lb 1.9 oz (91.7 kg)   SpO2 96%   BMI 33.63 kg/m   Body mass index is 33.63 kg/m.  Advanced Directives 04/25/2017 09/27/2016 02/11/2016 11/13/2014  Does Patient Have a Medical Advance Directive? Yes No Yes Yes  Type of Paramedic of [place];Living will - Living will Living will  Does patient want to make changes to medical advance directive? No - Patient declined - No - Patient declined -  Copy of [place] in Chart? No - copy requested - Yes -  Would patient like information on creating a medical advance directive? - No - Patient declined - -    Tobacco Social History   Tobacco Use  Smoking Status Never Smoker  Smokeless Tobacco Never Used     Counseling given: Not Answered   Clinical Intake:  Pre-visit preparation completed: Yes  Pain : No/denies pain     Nutritional Status: BMI > 30  Obese Diabetes: No  How often do you need to have someone help you when you read instructions, pamphlets, or other written materials from your doctor or pharmacy?: 1 - Never  Interpreter Needed?: No     Past Medical History:  Diagnosis Date  . Arthritis   . Diverticulitis   . GERD (gastroesophageal reflux disease)   . Hyperlipidemia   . Hypertension    Past Surgical History:  Procedure Laterality Date  . CHOLECYSTECTOMY    . ovaraian cyst removal Right    Cyst only (NOT OVARY)  . TOTAL KNEE ARTHROPLASTY Bilateral 05/05/2010    Family History  Problem Relation Age of Onset  . Alcohol abuse Mother   . Heart disease Father   . Diabetes Father   . Kidney cancer Neg Hx   . Bladder Cancer Neg Hx    Social History   Socioeconomic History  . Marital status: Single    Spouse name: None  . Number of children: None  . Years of education: None  . Highest education level: None  Social Needs  . Financial resource strain: None  . Food insecurity - worry: None  . Food insecurity - inability: None  . Transportation needs - medical: None  . Transportation needs - non-medical: None  Occupational History  . None  Tobacco Use  . Smoking status: Never Smoker  . Smokeless tobacco: Never Used  Substance and Sexual Activity  . Alcohol use: No    Alcohol/week: 0.0 oz  . Drug use: No  . Sexual activity: No  Other Topics Concern  . None  Social History Narrative   Retired    Lives by herself    Pets: None   Caffeine- Coffee 2 cups daily, no tea/soda       Outpatient Encounter Medications as of 04/25/2017  Medication Sig  . amLODipine (NORVASC) 5 MG tablet TAKE 1 TABLET BY MOUTH  DAILY  . aspirin 81 MG tablet Take by mouth.  Marland Kitchen  atorvastatin (LIPITOR) 80 MG tablet TAKE 1 TABLET BY MOUTH  DAILY  . CALCIUM-VITAMIN D PO Take by mouth.  . Multiple Vitamins tablet Take by mouth.  . Omega-3 Fatty Acids (FISH OIL PO)   . oxybutynin (DITROPAN) 5 MG tablet TAKE 1 TABLET BY MOUTH TWO  TIMES DAILY  . pantoprazole (PROTONIX) 40 MG tablet TAKE 1 TABLET BY MOUTH  DAILY  . potassium chloride (K-DUR,KLOR-CON) 10 MEQ tablet Take 1 tablet (10 mEq total) by mouth daily.  Marland Kitchen torsemide (DEMADEX) 5 MG tablet TAKE 1 TABLET BY MOUTH  DAILY  . UNABLE TO FIND tumeric   No facility-administered encounter medications on file as of 04/25/2017.     Activities of Daily Living In your present state of health, do you have any difficulty performing the following activities: 04/25/2017  Hearing? N  Vision? N  Difficulty concentrating or making  decisions? N  Walking or climbing stairs? Y  Dressing or bathing? N  Doing errands, shopping? N  Preparing Food and eating ? N  Using the Toilet? N  In the past six months, have you accidently leaked urine? N  Do you have problems with loss of bowel control? N  Managing your Medications? N  Managing your Finances? N  Housekeeping or managing your Housekeeping? N  Some recent data might be hidden    Patient Care Team: Theresa Haven, MD as PCP - General (Family Medicine)    Assessment:   This is a routine wellness examination for Theresa Reid. The goal of the wellness visit is to assist the patient how to close the gaps in care and create a preventative care plan for the patient.   The roster of all physicians providing medical care to patient is listed in the Snapshot section of the chart.  Osteoporosis risk reviewed.    Safety issues reviewed; Smoke and carbon monoxide detectors in the home. No firearms in the home.  Wears seatbelts when driving or riding with others. Patient does wear sunscreen or protective clothing when in direct sunlight. No violence in the home.  Depression- PHQ 2 &9 complete.  No signs/symptoms or verbal communication regarding little pleasure in doing things, feeling down, depressed or hopeless. No changes in sleeping, energy, eating, concentrating.  No thoughts of self harm or harm towards others.  Time spent on this topic is 8 minutes.   Patient is alert, normal appearance, oriented to person/place/and time. Correctly identified the president of the [place], recall of 3/3 words, and performing simple calculations. Displays appropriate judgement and can read correct time from watch face.   No new identified risk were noted.  No failures at ADL's or IADL's.   BMI- discussed the importance of a healthy diet, water intake and the benefits of aerobic exercise. Educational material provided.   24 hour diet recall: Regular diet Breakfast: boiled egg,  toast Lunch: salad Dinner: soup, vegetable Snack: pimento cheese Daily fluid intake: 3 small cups of caffeine, 6 cups of water  Dental- every 6 months. Dr. Mina Marble.  Eye- Visual acuity not assessed per patient preference since they have regular follow up with the ophthalmologist.  Wears corrective lenses.  Sleep patterns- Sleeps 7 hours at night.  Wakes feeling rested.   Health maintenance gaps- closed.  Patient Concerns: None at this time. Follow up with PCP as needed.  Exercise Activities and Dietary recommendations Current Exercise Habits: The patient does not participate in regular exercise at present  Goals    . Healthy Lifestyle  Low carb foods Stay hydrated Exercise       Fall Risk Fall Risk  04/25/2017 04/10/2017 02/11/2016 02/08/2016  Falls in the past year? No No No No   Depression Screen PHQ 2/9 Scores 04/25/2017 04/10/2017 02/11/2016 02/08/2016  PHQ - 2 Score 0 0 0 0     Cognitive Function MMSE - Mini Mental State Exam 04/25/2017 02/11/2016  Orientation to time 5 5  Orientation to Place 5 5  Registration 3 3  Attention/ Calculation 5 5  Recall 3 3  Language- name 2 objects 2 2  Language- repeat 1 1  Language- follow 3 step command 3 3  Language- read & follow direction 1 1  Write a sentence 1 1  Copy design 1 1  Total score 30 30        Immunization History  Administered Date(s) Administered  . Influenza Split 12/28/2010  . Influenza, Seasonal, Injecte, Preservative Fre 01/23/2012  . Influenza,inj,Quad PF,6+ Mos 02/06/2013, 02/03/2014  . Influenza-Unspecified 12/15/2014, 01/12/2016, 12/30/2016  . Pneumococcal Conjugate-13 02/11/2016, 03/01/2017  . Pneumococcal Polysaccharide-23 01/04/2010, 11/08/2012  . Tdap 12/30/2016  . Zoster 09/22/2008  . Zoster Recombinat (Shingrix) 03/01/2017    Screening Tests Health Maintenance  Topic Date Due  . TETANUS/TDAP  12/31/2026  . INFLUENZA VACCINE  Completed  . DEXA SCAN  Completed  . PNA vac Low  Risk Adult  Completed      Plan:    End of life planning; Advance aging; Advanced directives discussed. Copy of current HCPOA/Living Will requested.    I have personally reviewed and noted the following in the patient's chart:   . Medical and social history . Use of alcohol, tobacco or illicit drugs  . Current medications and supplements . Functional ability and status . Nutritional status . Physical activity . Advanced directives . List of other physicians . Hospitalizations, surgeries, and ER visits in previous 12 months . Vitals . Screenings to include cognitive, depression, and falls . Referrals and appointments  In addition, I have reviewed and discussed with patient certain preventive protocols, quality metrics, and best practice recommendations. A written personalized care plan for preventive services as well as general preventive health recommendations were provided to patient.     Varney Biles, LPN  09/24/9526

## 2017-04-25 NOTE — Patient Instructions (Addendum)
  Ms. Leaman , Thank you for taking time to come for your Medicare Wellness Visit. I appreciate your ongoing commitment to your health goals. Please review the following plan we discussed and let me know if I can assist you in the future.   Follow up with Dr. Caryl Bis as needed.    Bring a copy of your Center and/or Living Will to be scanned into chart.  Have a great day!  These are the goals we discussed: Goals    . Healthy Lifestyle     Low carb foods Stay hydrated Exercise       This is a list of the screening recommended for you and due dates:  Health Maintenance  Topic Date Due  . Tetanus Vaccine  12/31/2026  . Flu Shot  Completed  . DEXA scan (bone density measurement)  Completed  . Pneumonia vaccines  Completed

## 2017-04-26 DIAGNOSIS — M159 Polyosteoarthritis, unspecified: Secondary | ICD-10-CM | POA: Diagnosis not present

## 2017-04-26 DIAGNOSIS — M353 Polymyalgia rheumatica: Secondary | ICD-10-CM | POA: Diagnosis not present

## 2017-04-30 DIAGNOSIS — L820 Inflamed seborrheic keratosis: Secondary | ICD-10-CM | POA: Diagnosis not present

## 2017-05-22 ENCOUNTER — Ambulatory Visit (INDEPENDENT_AMBULATORY_CARE_PROVIDER_SITE_OTHER): Payer: Medicare Other

## 2017-05-22 DIAGNOSIS — R300 Dysuria: Secondary | ICD-10-CM | POA: Diagnosis not present

## 2017-05-22 LAB — URINALYSIS, COMPLETE
BILIRUBIN UA: NEGATIVE
Glucose, UA: NEGATIVE
Ketones, UA: NEGATIVE
Nitrite, UA: POSITIVE — AB
PH UA: 6.5 (ref 5.0–7.5)
Protein, UA: NEGATIVE
Specific Gravity, UA: 1.005 (ref 1.005–1.030)
UUROB: 0.2 mg/dL (ref 0.2–1.0)

## 2017-05-22 LAB — MICROSCOPIC EXAMINATION

## 2017-05-22 MED ORDER — CIPROFLOXACIN HCL 500 MG PO TABS: 500.0000 mg | ORAL_TABLET | Freq: Two times a day (BID) | ORAL | 0 refills | Status: DC

## 2017-05-22 NOTE — Progress Notes (Addendum)
Pt presents today with c/o urinary frequency, urgency, and dysuria. Pt refused to be cath'd. Therefore a clean catch was obtained for u/a and cx.  Blood pressure 130/65, pulse (!) 106, height 5\' 5"  (1.651 m), weight 201 lb (91.2 kg).  Per Dr. Erlene Quan cipro 500mg  bid x7 was given to pt.

## 2017-05-22 NOTE — Addendum Note (Signed)
Addended by: Toniann Fail C on: 05/22/2017 10:33 AM   Modules accepted: Orders

## 2017-05-26 LAB — URINE CULTURE

## 2017-05-28 ENCOUNTER — Telehealth: Payer: Self-pay | Admitting: Urology

## 2017-05-28 NOTE — Telephone Encounter (Signed)
Patient is calling and asking for her UA results   Theresa Reid

## 2017-05-31 NOTE — Telephone Encounter (Signed)
Spoke with pt in reference to ucx results. Pt stated that she still is having some symptoms. Made pt aware would need a cath specimen to send for ucx again. Pt stated that she will just wait for now then.

## 2017-06-27 ENCOUNTER — Encounter: Payer: Self-pay | Admitting: Family Medicine

## 2017-06-27 ENCOUNTER — Ambulatory Visit (INDEPENDENT_AMBULATORY_CARE_PROVIDER_SITE_OTHER): Payer: Medicare Other | Admitting: Family Medicine

## 2017-06-27 ENCOUNTER — Other Ambulatory Visit: Payer: Self-pay

## 2017-06-27 VITALS — BP 120/80 | HR 79 | Temp 97.4°F | Wt 202.2 lb

## 2017-06-27 DIAGNOSIS — H539 Unspecified visual disturbance: Secondary | ICD-10-CM

## 2017-06-27 DIAGNOSIS — R300 Dysuria: Secondary | ICD-10-CM | POA: Diagnosis not present

## 2017-06-27 DIAGNOSIS — R748 Abnormal levels of other serum enzymes: Secondary | ICD-10-CM

## 2017-06-27 DIAGNOSIS — L821 Other seborrheic keratosis: Secondary | ICD-10-CM | POA: Diagnosis not present

## 2017-06-27 DIAGNOSIS — I10 Essential (primary) hypertension: Secondary | ICD-10-CM

## 2017-06-27 DIAGNOSIS — E785 Hyperlipidemia, unspecified: Secondary | ICD-10-CM

## 2017-06-27 DIAGNOSIS — R1013 Epigastric pain: Secondary | ICD-10-CM | POA: Diagnosis not present

## 2017-06-27 NOTE — Patient Instructions (Signed)
Nice to see you. We will have you return for fasting lab work.

## 2017-06-27 NOTE — Assessment & Plan Note (Signed)
Plan to check lab work with fasting lipid panel.

## 2017-06-27 NOTE — Progress Notes (Signed)
Tommi Rumps, MD Phone: [phone]  Theresa Reid is a 77 y.o. female who presents today for f/u.  HYPERTENSION  Disease Monitoring  Home BP Monitoring not checking Chest pain- no    Dyspnea- no Medications  Compliance-  Taking amlodipine, torsedmide.  Edema- chronic L>R ankle for at least a decade, unchanged  Hyperlipidemia: Taking Lipitor.  No right upper quadrant pain or myalgias.  Reports her prior stomach discomfort resolved with discontinuing diclofenac.  It has not recurred.  She has had another UTI.  She saw urology for this and they treated her with Cipro.  Notes her symptoms resolved.  She wonders why she keeps getting these.  Has followed with urology.  She had an episode of vision change in 2018 which she was evaluated for in the emergency department.  She followed up with ophthalmology and they felt it was a temporary vessel blockage.  She has not had any recurrent issues.  She followed up with ophthalmology in late December.  She did see dermatology and the lesion on her right cheek was felt to be benign.  They used cryotherapy on it. She is to return to them for repeat cryotherapy.  Her alk phos has been slightly elevated for many years.  Plan on rechecking that.   Social History   Tobacco Use  Smoking Status Never Smoker  Smokeless Tobacco Never Used     ROS see history of present illness  Objective  Physical Exam Vitals:   06/27/17 1315  BP: 120/80  Pulse: 79  Temp: (!) 97.4 F (36.3 C)  SpO2: 98%    BP Readings from Last 3 Encounters:  06/27/17 120/80  05/22/17 130/65  04/25/17 128/72   Wt Readings from Last 3 Encounters:  06/27/17 202 lb 3.2 oz (91.7 kg)  05/22/17 201 lb (91.2 kg)  04/25/17 202 lb 1.9 oz (91.7 kg)    Physical Exam  Constitutional: No distress.  Cardiovascular: Normal rate, regular rhythm and normal heart sounds.  Pulmonary/Chest: Effort normal and breath sounds normal.  Abdominal: Soft. Bowel sounds are normal.  She exhibits no distension. There is no tenderness. There is no rebound and no guarding.  Musculoskeletal: She exhibits no edema.  Neurological: She is alert. Gait normal.  Skin: Skin is warm and dry. She is not diaphoretic.     Assessment/Plan: Please see individual problem list.  Hypertension Controlled.  Continue current regimen.  She will monitor her chronic edema and if it changes let us know.  Seborrheic keratosis Undergoing cryotherapy through dermatology.  She will follow-up with them for this.  Visual disturbance Has not recurred.  She will continue to follow with ophthalmology.  HLD (hyperlipidemia) Return for fasting lipid panel and other lab work.  Continue Lipitor.  Elevated alkaline phosphatase level Plan to check lab work with fasting lipid panel.  Epigastric abdominal pain No recurrence.  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  Dysuria Has had recurrent issues.  She is following with urology.  Symptoms have resolved at this time.  Discussed given her postmenopausal status as a female she is at risk for UTIs.  She will continue to follow with urology.   Orders Placed This Encounter  Procedures  . Comp Met (CMET)    Standing Status:   Future    Standing Expiration Date:   06/28/2018  . Lipid panel    Standing Status:   Future    Standing Expiration Date:   06/28/2018  . Gamma GT    Standing Status:   Future  Standing Expiration Date:   06/28/2018    No orders of the defined types were placed in this encounter.    Tommi Rumps, MD [place]

## 2017-06-27 NOTE — Assessment & Plan Note (Signed)
No recurrence.  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

## 2017-06-27 NOTE — Assessment & Plan Note (Signed)
Has not recurred.  She will continue to follow with ophthalmology.

## 2017-06-27 NOTE — Assessment & Plan Note (Signed)
Undergoing cryotherapy through dermatology.  She will follow-up with them for this.

## 2017-06-27 NOTE — Assessment & Plan Note (Signed)
Has had recurrent issues.  She is following with urology.  Symptoms have resolved at this time.  Discussed given her postmenopausal status as a female she is at risk for UTIs.  She will continue to follow with urology.

## 2017-06-27 NOTE — Assessment & Plan Note (Signed)
Controlled.  Continue current regimen.  She will monitor her chronic edema and if it changes let us know.

## 2017-06-27 NOTE — Assessment & Plan Note (Signed)
Return for fasting lipid panel and other lab work.  Continue Lipitor.

## 2017-07-02 ENCOUNTER — Telehealth: Payer: Self-pay

## 2017-07-02 NOTE — Telephone Encounter (Signed)
Patient does not want a mammogram

## 2017-07-02 NOTE — Telephone Encounter (Signed)
Noted  

## 2017-07-02 NOTE — Telephone Encounter (Signed)
-----   Message from Leone Haven, MD sent at 06/30/2017  5:01 PM EST ----- Regarding: Call patient I believe I may have discussed this with the patient at her recent visit and she possibly declined though I am not completely sure.  Please follow-up with her to see if she would like to proceed with mammogram for breast cancer screening.  If so I can place an order.  Thanks.  Randall Hiss. ----- Message ----- From: Burnard Hawthorne, FNP Sent: 06/26/2017   3:03 PM To: Leone Haven, MD  Note for overdue mammogram  Thought Id pass along to you as pcp   ----- Message ----- From: SYSTEM Sent: 08/13/2016  12:05 AM To: Burnard Hawthorne, FNP

## 2017-07-09 ENCOUNTER — Other Ambulatory Visit: Payer: Self-pay | Admitting: Family Medicine

## 2017-07-09 ENCOUNTER — Ambulatory Visit: Payer: Medicare Other | Admitting: Family Medicine

## 2017-07-09 DIAGNOSIS — R600 Localized edema: Secondary | ICD-10-CM

## 2017-07-11 ENCOUNTER — Other Ambulatory Visit (INDEPENDENT_AMBULATORY_CARE_PROVIDER_SITE_OTHER): Payer: Medicare Other

## 2017-07-11 DIAGNOSIS — E785 Hyperlipidemia, unspecified: Secondary | ICD-10-CM

## 2017-07-11 DIAGNOSIS — R748 Abnormal levels of other serum enzymes: Secondary | ICD-10-CM | POA: Diagnosis not present

## 2017-07-12 LAB — COMPREHENSIVE METABOLIC PANEL
ALBUMIN: 4.5 g/dL (ref 3.5–5.2)
ALK PHOS: 96 U/L (ref 39–117)
ALT: 14 U/L (ref 0–35)
AST: 16 U/L (ref 0–37)
BUN: 9 mg/dL (ref 6–23)
CALCIUM: 9.7 mg/dL (ref 8.4–10.5)
CHLORIDE: 102 meq/L (ref 96–112)
CO2: 29 mEq/L (ref 19–32)
Creatinine, Ser: 0.65 mg/dL (ref 0.40–1.20)
GFR: 93.64 mL/min (ref >60.00)
Glucose, Bld: 97 mg/dL (ref 70–99)
Potassium: 3.5 mEq/L (ref 3.5–5.1)
Sodium: 142 mEq/L (ref 135–145)
TOTAL PROTEIN: 7.2 g/dL (ref 6.0–8.3)
Total Bilirubin: 0.2 mg/dL (ref 0.2–1.2)

## 2017-07-12 LAB — LIPID PANEL
CHOL/HDL RATIO: 3
CHOLESTEROL: 199 mg/dL (ref 0–200)
HDL: 71.1 mg/dL (ref >39.00)
NonHDL: 127.91
TRIGLYCERIDES: 242 mg/dL — AB (ref 0.0–149.0)
VLDL: 48.4 mg/dL — ABNORMAL HIGH (ref 0.0–40.0)

## 2017-07-12 LAB — GAMMA GT: GGT: 15 U/L (ref 7–51)

## 2017-07-12 LAB — LDL CHOLESTEROL, DIRECT: Direct LDL: 90 mg/dL

## 2017-08-28 ENCOUNTER — Ambulatory Visit: Payer: Self-pay

## 2017-08-28 NOTE — Telephone Encounter (Signed)
Pt. C/o dizziness x 1 week when she changes positions. No vertigo. States she "thinks her blood pressure medicine needs to be lowered." Does not check her BP at home. Appointment made.No availability with Dr. Caryl Bis.  Reason for Disposition . [1] MILD dizziness (e.g., walking normally) AND [2] has NOT been evaluated by physician for this  (Exception: dizziness caused by heat exposure, sudden standing, or poor fluid intake)  Answer Assessment - Initial Assessment Questions 1. DESCRIPTION: "Describe your dizziness."     Swimmy headed 2. LIGHTHEADED: "Do you feel lightheaded?" (e.g., somewhat faint, woozy, weak upon standing)     In mornings and sometimes when changing positions 3. VERTIGO: "Do you feel like either you or the room is spinning or tilting?" (i.e. vertigo)     No 4. SEVERITY: "How bad is it?"  "Do you feel like you are going to faint?" "Can you stand and walk?"   - MILD - walking normally   - MODERATE - interferes with normal activities (e.g., work, school)    - SEVERE - unable to stand, requires support to walk, feels like passing out now.      Mild 5. ONSET:  "When did the dizziness begin?"     Started in the last week. 6. AGGRAVATING FACTORS: "Does anything make it worse?" (e.g., standing, change in head position)     Change in position 7. HEART RATE: "Can you tell me your heart rate?" "How many beats in 15 seconds?"  (Note: not all patients can do this)       N/A 8. CAUSE: "What do you think is causing the dizziness?"     mAYBE THE BLOOD PRESSURE 9. RECURRENT SYMPTOM: "Have you had dizziness before?" If so, ask: "When was the last time?" "What happened that time?"     No 10. OTHER SYMPTOMS: "Do you have any other symptoms?" (e.g., fever, chest pain, vomiting, diarrhea, bleeding)       No 11. PREGNANCY: "Is there any chance you are pregnant?" "When was your last menstrual period?"       No  Protocols used: DIZZINESS [facility]

## 2017-08-28 NOTE — Telephone Encounter (Signed)
fyi

## 2017-08-30 ENCOUNTER — Ambulatory Visit: Payer: Medicare Other | Admitting: Family Medicine

## 2017-09-04 ENCOUNTER — Ambulatory Visit: Payer: Medicare Other | Admitting: Family Medicine

## 2017-09-11 ENCOUNTER — Ambulatory Visit: Payer: Medicare Other | Admitting: Family Medicine

## 2017-11-05 ENCOUNTER — Encounter: Payer: Self-pay | Admitting: Family Medicine

## 2017-11-05 ENCOUNTER — Ambulatory Visit (INDEPENDENT_AMBULATORY_CARE_PROVIDER_SITE_OTHER): Payer: Medicare Other | Admitting: Family Medicine

## 2017-11-05 VITALS — BP 118/78 | HR 92 | Temp 98.1°F | Wt 200.8 lb

## 2017-11-05 DIAGNOSIS — R300 Dysuria: Secondary | ICD-10-CM | POA: Diagnosis not present

## 2017-11-05 LAB — POCT URINALYSIS DIPSTICK
Bilirubin, UA: NEGATIVE
GLUCOSE UA: NEGATIVE
Ketones, UA: NEGATIVE
Leukocytes, UA: NEGATIVE
Nitrite, UA: NEGATIVE
Protein, UA: NEGATIVE
RBC UA: NEGATIVE
Spec Grav, UA: <=1.005 — AB (ref 1.010–1.025)
Urobilinogen, UA: 0.2 E.U./dL
pH, UA: 6 (ref 5.0–8.0)

## 2017-11-05 NOTE — Progress Notes (Signed)
  Tommi Rumps, MD Phone: [phone]  Theresa Reid is a 77 y.o. female who presents today for same day.   CC: dysuria  Patient notes for 10 days she has had discomfort prior to urinating though no burning with urination.  She has had urgency and frequency.  No vaginal discharge.  No hematuria.  No abdominal pain.  She was taking Azo with some benefit though stopped several days ago so that the urinalysis would not be abnormal due to the medication.  Social History   Tobacco Use  Smoking Status Never Smoker  Smokeless Tobacco Never Used     ROS see history of present illness  Objective  Physical Exam Vitals:   11/05/17 1551  BP: 118/78  Pulse: 92  Temp: 98.1 F (36.7 C)  SpO2: 94%    BP Readings from Last 3 Encounters:  11/05/17 118/78  06/27/17 120/80  05/22/17 130/65   Wt Readings from Last 3 Encounters:  11/05/17 200 lb 12.8 oz (91.1 kg)  06/27/17 202 lb 3.2 oz (91.7 kg)  05/22/17 201 lb (91.2 kg)    Physical Exam  Constitutional: No distress.  Cardiovascular: Normal rate, regular rhythm and normal heart sounds.  Pulmonary/Chest: Effort normal and breath sounds normal.  Abdominal: Soft. Bowel sounds are normal. She exhibits no distension. There is no tenderness.  Neurological: She is alert.  Skin: Skin is warm and dry. She is not diaphoretic.     Assessment/Plan: Please see individual problem list.  Dysuria Patient with discomfort prior to urinating as well as urgency and frequency.  Urinalysis is unremarkable.  Will send for urine culture prior to placing on antibiotics.  If urine culture is negative she will need to see urology.  Return precautions and AVS.   Orders Placed This Encounter  Procedures  . Urine Culture  . POCT Urinalysis Dipstick    No orders of the defined types were placed in this encounter.    Tommi Rumps, MD [place]

## 2017-11-05 NOTE — Patient Instructions (Signed)
Nice to see you. We are going to send your urine for culture and we will contact you when it returns to determine if you need an antibiotic. If your symptoms worsen please let us know.  If you develop abdominal pain or blood in your urine please be evaluated.

## 2017-11-05 NOTE — Assessment & Plan Note (Signed)
Patient with discomfort prior to urinating as well as urgency and frequency.  Urinalysis is unremarkable.  Will send for urine culture prior to placing on antibiotics.  If urine culture is negative she will need to see urology.  Return precautions and AVS.

## 2017-11-06 LAB — URINE CULTURE
MICRO NUMBER: 90835012
SPECIMEN QUALITY: ADEQUATE

## 2018-01-03 ENCOUNTER — Other Ambulatory Visit: Payer: Self-pay | Admitting: Family Medicine

## 2018-01-03 DIAGNOSIS — T502X5A Adverse effect of carbonic-anhydrase inhibitors, benzothiadiazides and other diuretics, initial encounter: Principal | ICD-10-CM

## 2018-01-03 DIAGNOSIS — E876 Hypokalemia: Secondary | ICD-10-CM

## 2018-01-03 NOTE — Telephone Encounter (Signed)
Last filled 11/15/17 Previously written by Dr Lacinda Axon  Last office visit 06/27/17 Next office visit 01/14/18

## 2018-01-08 ENCOUNTER — Ambulatory Visit: Payer: Medicare Other | Admitting: Family Medicine

## 2018-01-08 NOTE — Telephone Encounter (Signed)
Last OV 11/05/2017   Last refilled 07/09/2017 180 with 1   Sent to PCP to advise

## 2018-01-14 ENCOUNTER — Ambulatory Visit (INDEPENDENT_AMBULATORY_CARE_PROVIDER_SITE_OTHER): Payer: Medicare Other | Admitting: Family Medicine

## 2018-01-14 ENCOUNTER — Encounter: Payer: Self-pay | Admitting: Family Medicine

## 2018-01-14 VITALS — BP 108/72 | HR 87 | Temp 98.3°F | Ht 65.0 in | Wt 196.2 lb

## 2018-01-14 DIAGNOSIS — Z1239 Encounter for other screening for malignant neoplasm of breast: Secondary | ICD-10-CM

## 2018-01-14 DIAGNOSIS — R7309 Other abnormal glucose: Secondary | ICD-10-CM

## 2018-01-14 DIAGNOSIS — Z1231 Encounter for screening mammogram for malignant neoplasm of breast: Secondary | ICD-10-CM

## 2018-01-14 DIAGNOSIS — E785 Hyperlipidemia, unspecified: Secondary | ICD-10-CM | POA: Diagnosis not present

## 2018-01-14 DIAGNOSIS — I10 Essential (primary) hypertension: Secondary | ICD-10-CM | POA: Diagnosis not present

## 2018-01-14 DIAGNOSIS — Z1211 Encounter for screening for malignant neoplasm of colon: Secondary | ICD-10-CM

## 2018-01-14 DIAGNOSIS — R300 Dysuria: Secondary | ICD-10-CM | POA: Diagnosis not present

## 2018-01-14 DIAGNOSIS — K9049 Malabsorption due to intolerance, not elsewhere classified: Secondary | ICD-10-CM

## 2018-01-14 DIAGNOSIS — M159 Polyosteoarthritis, unspecified: Secondary | ICD-10-CM

## 2018-01-14 DIAGNOSIS — K909 Intestinal malabsorption, unspecified: Secondary | ICD-10-CM | POA: Insufficient documentation

## 2018-01-14 DIAGNOSIS — M150 Primary generalized (osteo)arthritis: Secondary | ICD-10-CM

## 2018-01-14 LAB — LIPID PANEL
CHOLESTEROL: 168 mg/dL (ref 0–200)
HDL: 69 mg/dL (ref >39.00)
LDL Cholesterol: 76 mg/dL (ref 0–99)
NonHDL: 98.67
TRIGLYCERIDES: 112 mg/dL (ref 0.0–149.0)
Total CHOL/HDL Ratio: 2
VLDL: 22.4 mg/dL (ref 0.0–40.0)

## 2018-01-14 LAB — COMPREHENSIVE METABOLIC PANEL
ALK PHOS: 100 U/L (ref 39–117)
ALT: 16 U/L (ref 0–35)
AST: 16 U/L (ref 0–37)
Albumin: 4.3 g/dL (ref 3.5–5.2)
BILIRUBIN TOTAL: 0.5 mg/dL (ref 0.2–1.2)
BUN: 9 mg/dL (ref 6–23)
CALCIUM: 9.7 mg/dL (ref 8.4–10.5)
CO2: 31 mEq/L (ref 19–32)
Chloride: 100 mEq/L (ref 96–112)
Creatinine, Ser: 0.64 mg/dL (ref 0.40–1.20)
GFR: 95.21 mL/min (ref >60.00)
Glucose, Bld: 116 mg/dL — ABNORMAL HIGH (ref 70–99)
POTASSIUM: 4 meq/L (ref 3.5–5.1)
Sodium: 140 mEq/L (ref 135–145)
TOTAL PROTEIN: 7.5 g/dL (ref 6.0–8.3)

## 2018-01-14 LAB — HEMOGLOBIN A1C: Hgb A1c MFr Bld: 6.2 % (ref 4.6–6.5)

## 2018-01-14 NOTE — Assessment & Plan Note (Signed)
-

## 2018-01-14 NOTE — Assessment & Plan Note (Signed)
Patient will continue to avoid dairy.  She has done well since cutting this out.

## 2018-01-14 NOTE — Progress Notes (Signed)
  Tommi Rumps, MD Phone: [phone]  CODI FOLKERTS is a 77 y.o. female who presents today for f/u.  CC: htn, hld, dysuria, arthritis, dairy sensitivity  HYPERTENSION  Disease Monitoring  Home BP Monitoring not checking Chest pain- no    Dyspnea- no Medications  Compliance-  Taking amlodipine, torsemide.   Edema- chronic and unchanged  HYPERLIPIDEMIA Symptoms Chest pain on exertion:  no    Medications: Compliance- taking lipitor Right upper quadrant pain- no  Muscle aches- no  Dysuria: Patient notes this has resolved completely.  No frequency or urgency.  No blood in her urine.  Arthritis: She is following with rheumatology.  They have her on tumeric.  She takes prednisone intermittently if she has a flare.  Overall doing quite well.  Sensitivity to dairy: Patient notes she was having quite a bit of gas previously.  She noted it seemed to coincide with dairy intake.  She cut the dairy out and she has not had any issues.  No blood in her stool.     Social History   Tobacco Use  Smoking Status Never Smoker  Smokeless Tobacco Never Used     ROS see history of present illness  Objective  Physical Exam Vitals:   01/14/18 1316  BP: 108/72  Pulse: 87  Temp: 98.3 F (36.8 C)  SpO2: 93%    BP Readings from Last 3 Encounters:  01/14/18 108/72  11/05/17 118/78  06/27/17 120/80   Wt Readings from Last 3 Encounters:  01/14/18 196 lb 3.2 oz (89 kg)  11/05/17 200 lb 12.8 oz (91.1 kg)  06/27/17 202 lb 3.2 oz (91.7 kg)    Physical Exam  Constitutional: No distress.  Cardiovascular: Normal rate, regular rhythm and normal heart sounds.  Pulmonary/Chest: Effort normal and breath sounds normal.  Musculoskeletal: She exhibits no edema.  Neurological: She is alert.  Skin: Skin is warm and dry. She is not diaphoretic.     Assessment/Plan: Please see individual problem list.  Hypertension Well-controlled.  Continue current regimen.  Osteoarthritis of multiple  joints She will continue to follow with rheumatology.  Dairy product intolerance Patient will continue to avoid dairy.  She has done well since cutting this out.  Dysuria No recurrence.  She will monitor.  HLD (hyperlipidemia) Continue Lipitor.  Check lipid panel.   Health Maintenance: Mammogram ordered.  Given the number to call.  Stool cards given for colon cancer screening.  Patient declines colonoscopy and preferred stool cards over Cologuard.  Orders Placed This Encounter  Procedures  . Fecal occult blood, imunochemical    Standing Status:   Future    Standing Expiration Date:   01/15/2019  . MM 3D SCREEN BREAST BILATERAL    Standing Status:   Future    Standing Expiration Date:   03/17/2019    Order Specific Question:   Reason for Exam (SYMPTOM  OR DIAGNOSIS REQUIRED)    Answer:   breast cancer screening    Order Specific Question:   Preferred imaging location?    Answer:   [facility]  . Comp Met (CMET)  . Lipid panel  . HgB A1c    No orders of the defined types were placed in this encounter.    Tommi Rumps, MD [place]

## 2018-01-14 NOTE — Patient Instructions (Signed)
Nice to see you. We will check labs today. Please let us know if your urinary symptoms return. Please complete the stool cards. Please call to schedule a mammogram.

## 2018-01-14 NOTE — Assessment & Plan Note (Signed)
Well-controlled.  Continue current regimen. 

## 2018-01-14 NOTE — Assessment & Plan Note (Signed)
She will continue to follow with rheumatology 

## 2018-01-14 NOTE — Assessment & Plan Note (Addendum)
No recurrence.  She will monitor.

## 2018-04-26 ENCOUNTER — Ambulatory Visit: Payer: Medicare Other

## 2018-05-02 ENCOUNTER — Ambulatory Visit (INDEPENDENT_AMBULATORY_CARE_PROVIDER_SITE_OTHER): Payer: Medicare Other

## 2018-05-02 VITALS — BP 120/72 | HR 88 | Temp 97.5°F | Resp 14 | Ht 64.5 in | Wt 195.1 lb

## 2018-05-02 DIAGNOSIS — Z0000 Encounter for general adult medical examination without abnormal findings: Secondary | ICD-10-CM

## 2018-05-02 NOTE — Progress Notes (Signed)
Subjective:   Theresa Reid is a 78 y.o. female who presents for Medicare Annual (Subsequent) preventive examination.  Review of Systems:  No ROS.  Medicare Wellness Visit. Additional risk factors are reflected in the social history. Cardiac Risk Factors include: advanced age (>76men, >62 women);hypertension     Objective:     Vitals: BP 120/72 (BP Location: Left Arm, Patient Position: Sitting, Cuff Size: Normal)   Pulse 88   Temp (!) 97.5 F (36.4 C) (Oral)   Resp 14   Ht 5' 4.5" (1.638 m)   Wt 195 lb 1.9 oz (88.5 kg)   SpO2 98%   BMI 32.98 kg/m   Body mass index is 32.98 kg/m.  Advanced Directives 05/02/2018 04/25/2017 09/27/2016 02/11/2016 11/13/2014  Does Patient Have a Medical Advance Directive? Yes Yes No Yes Yes  Type of Advance Directive Living will;Healthcare Power of [place];Living will - Living will Living will  Does patient want to make changes to medical advance directive? No - Patient declined No - Patient declined - No - Patient declined -  Copy of [place] in Chart? No - copy requested No - copy requested - Yes -  Would patient like information on creating a medical advance directive? - - No - Patient declined - -    Tobacco Social History   Tobacco Use  Smoking Status Never Smoker  Smokeless Tobacco Never Used     Counseling given: Not Answered   Clinical Intake:  Pre-visit preparation completed: Yes  Pain : No/denies pain     Diabetes: No  How often do you need to have someone help you when you read instructions, pamphlets, or other written materials from your doctor or pharmacy?: 1 - Never  Interpreter Needed?: No     Past Medical History:  Diagnosis Date  . Arthritis   . Diverticulitis   . GERD (gastroesophageal reflux disease)   . Hyperlipidemia   . Hypertension    Past Surgical History:  Procedure Laterality Date  . CHOLECYSTECTOMY    . ovaraian cyst removal Right    Cyst only  (NOT OVARY)  . TOTAL KNEE ARTHROPLASTY Bilateral 05/05/2010   Family History  Problem Relation Age of Onset  . Alcohol abuse Mother   . Heart disease Father   . Diabetes Father   . Kidney cancer Neg Hx   . Bladder Cancer Neg Hx    Social History   Socioeconomic History  . Marital status: Single    Spouse name: Not on file  . Number of children: Not on file  . Years of education: Not on file  . Highest education level: Not on file  Occupational History  . Not on file  Social Needs  . Financial resource strain: Not hard at all  . Food insecurity:    Worry: Never true    Inability: Never true  . Transportation needs:    Medical: No    Non-medical: No  Tobacco Use  . Smoking status: Never Smoker  . Smokeless tobacco: Never Used  Substance and Sexual Activity  . Alcohol use: No    Alcohol/week: 0.0 standard drinks  . Drug use: No  . Sexual activity: Never  Lifestyle  . Physical activity:    Days per week: Not on file    Minutes per session: Not on file  . Stress: Not at all  Relationships  . Social connections:    Talks on phone: Not on file  Gets together: Not on file    Attends religious service: Not on file    Active member of club or organization: Not on file    Attends meetings of clubs or organizations: Not on file    Relationship status: Not on file  Other Topics Concern  . Not on file  Social History Narrative   Retired    Lives by herself    Pets: None   Caffeine- Coffee 2 cups daily, no tea/soda       Outpatient Encounter Medications as of 05/02/2018  Medication Sig  . amLODipine (NORVASC) 5 MG tablet TAKE 1 TABLET BY MOUTH  DAILY  . atorvastatin (LIPITOR) 80 MG tablet TAKE 1 TABLET BY MOUTH  DAILY  . CALCIUM-VITAMIN D PO Take by mouth.  . CRANBERRY PO Take by mouth.  . Multiple Vitamins tablet Take by mouth.  . Omega-3 Fatty Acids (FISH OIL PO)   . oxybutynin (DITROPAN) 5 MG tablet TAKE 1 TABLET BY MOUTH TWO  TIMES DAILY  . pantoprazole  (PROTONIX) 40 MG tablet TAKE 1 TABLET BY MOUTH  DAILY  . potassium chloride (K-DUR,KLOR-CON) 10 MEQ tablet TAKE 1 TABLET BY MOUTH  DAILY  . torsemide (DEMADEX) 5 MG tablet TAKE 1 TABLET BY MOUTH  DAILY  . Turmeric 500 MG CAPS Take 1,000 mg by mouth.   No facility-administered encounter medications on file as of 05/02/2018.     Activities of Daily Living In your present state of health, do you have any difficulty performing the following activities: 05/02/2018  Hearing? N  Vision? N  Difficulty concentrating or making decisions? N  Walking or climbing stairs? N  Dressing or bathing? N  Doing errands, shopping? N  Preparing Food and eating ? N  Using the Toilet? N  In the past six months, have you accidently leaked urine? Y  Comment Managed with daily liner  Do you have problems with loss of bowel control? N  Managing your Medications? N  Managing your Finances? N  Housekeeping or managing your Housekeeping? N  Some recent data might be hidden    Patient Care Team: Leone Haven, MD as PCP - General (Family Medicine)    Assessment:   This is a routine wellness examination for Theresa Reid.  Health Screenings  Mammogram -05/29/13 Bone Density-11/08/16 Glaucoma -none reported Hearing-passes the whisper test Hemoglobin A1C -01/14/18 (6.2) Cholesterol -01/14/18 (168)  Social  Alcohol intake -none Smoking history- none Smokers in home?none Illicit drug use?none Exercise -walking Diet -regular Sexually Active -never  Safety  Patient feels safe at home.  Patient does have smoke detectors at home  Patient does wear sunscreen or protective clothing when in direct sunlight. Patient does wear seat belt when driving or riding with others.   Activities of Daily Living Patient can do their own household chores. Denies needing assistance with: driving, feeding themselves, getting from bed to chair, getting to the toilet, bathing/showering, dressing, managing money, climbing flight of  stairs, or preparing meals.   Depression Screen Patient denies losing interest in daily life, feeling hopeless, or crying easily over simple problems.   Fall Screen Patient denies being afraid of falling or falling in the last year.   Memory Screen Patient denies problems with memory, misplacing items, and is able to balance checkbook/bank accounts.  Patient is alert, normal appearance, oriented to person/place/and time. Correctly identified the president of the [place], recall of 3/3 objects, and performing simple calculations.  Patient displays appropriate judgement and can read correct time from  watch face.   Immunizations The following Immunizations are up to date: Influenza, shingles, pneumonia, and tetanus.   Other Providers Patient Care Team: Leone Haven, MD as PCP - General (Family Medicine)  Exercise Activities and Dietary recommendations Current Exercise Habits: Home exercise routine, Type of exercise: walking, Time (Minutes): 20, Frequency (Times/Week): 4, Weekly Exercise (Minutes/Week): 80, Intensity: Mild  Goals      Patient Stated   . Maintain Healthy Lifestyle (pt-stated)       Fall Risk Fall Risk  05/02/2018 04/25/2017 04/10/2017 02/11/2016 02/08/2016  Falls in the past year? 0 No No No No   Depression Screen PHQ 2/9 Scores 05/02/2018 04/25/2017 04/10/2017 02/11/2016  PHQ - 2 Score 0 0 0 0     Cognitive Function MMSE - Mini Mental State Exam 05/02/2018 04/25/2017 02/11/2016  Orientation to time 5 5 5   Orientation to Place 5 5 5   Registration 3 3 3   Attention/ Calculation 5 5 5   Recall 3 3 3   Language- name 2 objects 2 2 2   Language- repeat 1 1 1   Language- follow 3 step command 3 3 3   Language- read & follow direction 1 1 1   Write a sentence 1 1 1   Copy design 1 1 1   Total score 30 30 30         Immunization History  Administered Date(s) Administered  . Influenza Split 12/28/2010  . Influenza, High Dose Seasonal PF 01/22/2018  . Influenza, Seasonal,  Injecte, Preservative Fre 01/23/2012  . Influenza,inj,Quad PF,6+ Mos 02/06/2013, 02/03/2014  . Influenza-Unspecified 12/15/2014, 01/12/2016, 12/30/2016  . Pneumococcal Conjugate-13 02/11/2016, 03/01/2017  . Pneumococcal Polysaccharide-23 01/04/2010, 11/08/2012  . Tdap 12/30/2016  . Zoster 09/22/2008  . Zoster Recombinat (Shingrix) 03/01/2017   Screening Tests Health Maintenance  Topic Date Due  . TETANUS/TDAP  12/31/2026  . INFLUENZA VACCINE  Completed  . DEXA SCAN  Completed  . PNA vac Low Risk Adult  Completed      Plan:    End of life planning; Advance aging; Advanced directives discussed. Copy of current HCPOA/Living Will requested.    I have personally reviewed and noted the following in the patient's chart:   . Medical and social history . Use of alcohol, tobacco or illicit drugs  . Current medications and supplements . Functional ability and status . Nutritional status . Physical activity . Advanced directives . List of other physicians . Hospitalizations, surgeries, and ER visits in previous 12 months . Vitals . Screenings to include cognitive, depression, and falls . Referrals and appointments  In addition, I have reviewed and discussed with patient certain preventive protocols, quality metrics, and best practice recommendations. A written personalized care plan for preventive services as well as general preventive health recommendations were provided to patient.     Varney Biles, LPN  06/25/1962

## 2018-05-02 NOTE — Patient Instructions (Addendum)
  Theresa Reid , Thank you for taking time to come for your Medicare Wellness Visit. I appreciate your ongoing commitment to your health goals. Please review the following plan we discussed and let me know if I can assist you in the future.   Follow up as needed.    Bring a copy of your [place] and/or Living Will to be scanned into chart.  Have a great day!  These are the goals we discussed: Goals      Patient Stated   . Maintain Healthy Lifestyle (pt-stated)       This is a list of the screening recommended for you and due dates:  Health Maintenance  Topic Date Due  . Tetanus Vaccine  12/31/2026  . Flu Shot  Completed  . DEXA scan (bone density measurement)  Completed  . Pneumonia vaccines  Completed

## 2018-05-02 NOTE — Progress Notes (Signed)
Reviewed LPN note. No acute concerns. Follow with PCP

## 2018-05-03 ENCOUNTER — Other Ambulatory Visit (INDEPENDENT_AMBULATORY_CARE_PROVIDER_SITE_OTHER): Payer: Medicare Other

## 2018-05-03 DIAGNOSIS — Z1211 Encounter for screening for malignant neoplasm of colon: Secondary | ICD-10-CM | POA: Diagnosis not present

## 2018-05-03 LAB — FECAL OCCULT BLOOD, IMMUNOCHEMICAL: FECAL OCCULT BLD: NEGATIVE

## 2018-05-08 ENCOUNTER — Other Ambulatory Visit: Payer: Self-pay | Admitting: Family Medicine

## 2018-05-08 ENCOUNTER — Ambulatory Visit
Admission: RE | Admit: 2018-05-08 | Discharge: 2018-05-08 | Disposition: A | Discharge status: Home / Self Care | Payer: Medicare Other | Source: Ambulatory Visit | Attending: Family Medicine | Admitting: Family Medicine

## 2018-05-08 DIAGNOSIS — Z1239 Encounter for other screening for malignant neoplasm of breast: Secondary | ICD-10-CM

## 2018-05-08 DIAGNOSIS — R928 Other abnormal and inconclusive findings on diagnostic imaging of breast: Secondary | ICD-10-CM

## 2018-05-08 DIAGNOSIS — Z1231 Encounter for screening mammogram for malignant neoplasm of breast: Secondary | ICD-10-CM | POA: Insufficient documentation

## 2018-05-08 DIAGNOSIS — N6310 Unspecified lump in the right breast, unspecified quadrant: Secondary | ICD-10-CM

## 2018-05-15 ENCOUNTER — Other Ambulatory Visit: Payer: Self-pay | Admitting: Family Medicine

## 2018-05-15 DIAGNOSIS — R600 Localized edema: Secondary | ICD-10-CM

## 2018-05-22 ENCOUNTER — Ambulatory Visit
Admission: RE | Admit: 2018-05-22 | Discharge: 2018-05-22 | Disposition: A | Discharge status: Home / Self Care | Payer: Medicare Other | Source: Ambulatory Visit | Attending: Family Medicine | Admitting: Family Medicine

## 2018-05-22 DIAGNOSIS — R928 Other abnormal and inconclusive findings on diagnostic imaging of breast: Secondary | ICD-10-CM | POA: Insufficient documentation

## 2018-05-22 DIAGNOSIS — N6310 Unspecified lump in the right breast, unspecified quadrant: Secondary | ICD-10-CM | POA: Diagnosis not present

## 2018-05-22 DIAGNOSIS — N6314 Unspecified lump in the right breast, lower inner quadrant: Secondary | ICD-10-CM | POA: Diagnosis not present

## 2018-05-23 ENCOUNTER — Other Ambulatory Visit: Payer: Self-pay | Admitting: Family Medicine

## 2018-05-23 DIAGNOSIS — R928 Other abnormal and inconclusive findings on diagnostic imaging of breast: Secondary | ICD-10-CM

## 2018-05-23 DIAGNOSIS — N6310 Unspecified lump in the right breast, unspecified quadrant: Secondary | ICD-10-CM

## 2018-05-28 ENCOUNTER — Ambulatory Visit
Admission: RE | Admit: 2018-05-28 | Discharge: 2018-05-28 | Disposition: A | Discharge status: Home / Self Care | Payer: Medicare Other | Source: Ambulatory Visit | Attending: Family Medicine | Admitting: Family Medicine

## 2018-05-28 DIAGNOSIS — N6310 Unspecified lump in the right breast, unspecified quadrant: Secondary | ICD-10-CM

## 2018-05-28 DIAGNOSIS — N6311 Unspecified lump in the right breast, upper outer quadrant: Secondary | ICD-10-CM | POA: Diagnosis not present

## 2018-05-28 DIAGNOSIS — R928 Other abnormal and inconclusive findings on diagnostic imaging of breast: Secondary | ICD-10-CM | POA: Diagnosis not present

## 2018-05-28 DIAGNOSIS — C50411 Malignant neoplasm of upper-outer quadrant of right female breast: Secondary | ICD-10-CM | POA: Diagnosis not present

## 2018-05-28 HISTORY — PX: BREAST BIOPSY: SHX20

## 2018-05-29 ENCOUNTER — Telehealth: Payer: Self-pay | Admitting: Family Medicine

## 2018-05-29 NOTE — Telephone Encounter (Signed)
Called and spoke with patient.  She reports she had already been informed of the results and that the nurse navigator would be working on getting her set up for an MRI and an appointment with the surgeon.  She wondered what the process would be after this and I advised that she would likely see the surgeon and they would discuss whether a lumpectomy or more extensive surgery was necessary and they would discuss treatment options.  She verbalized understanding and appreciation for the call.  I advised that if she does not hear anything about seeing the surgeon or the MRI in the near future she should contact us to help arrange this.

## 2018-05-30 ENCOUNTER — Encounter: Payer: Self-pay | Admitting: *Deleted

## 2018-05-30 DIAGNOSIS — C50911 Malignant neoplasm of unspecified site of right female breast: Secondary | ICD-10-CM

## 2018-05-30 NOTE — Progress Notes (Signed)
  Oncology Nurse Navigator Documentation  Navigator Location: [facility] (05/30/18 0900) Referral date to RadOnc/MedOnc: 06/04/18 (05/30/18 0900) )Navigator Encounter Type: Introductory phone call (05/30/18 0900)   Abnormal Finding Date: 05/22/18 (05/30/18 0900) Confirmed Diagnosis Date: 05/29/18 (05/30/18 0900)                   Barriers/Navigation Needs: Education;Coordination of Care (05/30/18 0900)   Interventions: Coordination of Care (05/30/18 0900)   Coordination of Care: Appts (05/30/18 0900)                  Time Spent with Patient: 30 (05/30/18 0900)   Called patient to establish navigation services.  Patient is newly diagnosed with invasive breast cancer.  She has been scheduled to see Dr. Bary Castilla on and Dr. Janese Banks on 06/04/18 @ 4:30 and 1:30.  I will give her educational literature at that time.  She is to call if she has any questions or needs.

## 2018-05-31 ENCOUNTER — Other Ambulatory Visit: Payer: Self-pay | Admitting: Family Medicine

## 2018-05-31 DIAGNOSIS — C50919 Malignant neoplasm of unspecified site of unspecified female breast: Secondary | ICD-10-CM

## 2018-05-31 LAB — SURGICAL PATHOLOGY

## 2018-06-04 ENCOUNTER — Ambulatory Visit (INDEPENDENT_AMBULATORY_CARE_PROVIDER_SITE_OTHER): Payer: Medicare Other

## 2018-06-04 ENCOUNTER — Encounter: Payer: Self-pay | Admitting: General Surgery

## 2018-06-04 ENCOUNTER — Inpatient Hospital Stay: Payer: Medicare Other

## 2018-06-04 ENCOUNTER — Encounter: Payer: Self-pay | Admitting: Oncology

## 2018-06-04 ENCOUNTER — Inpatient Hospital Stay: Payer: Medicare Other | Attending: Oncology | Admitting: Oncology

## 2018-06-04 ENCOUNTER — Other Ambulatory Visit: Payer: Self-pay | Admitting: *Deleted

## 2018-06-04 ENCOUNTER — Encounter: Payer: Self-pay | Admitting: *Deleted

## 2018-06-04 ENCOUNTER — Other Ambulatory Visit: Payer: Self-pay

## 2018-06-04 ENCOUNTER — Ambulatory Visit (INDEPENDENT_AMBULATORY_CARE_PROVIDER_SITE_OTHER): Payer: Medicare Other | Admitting: General Surgery

## 2018-06-04 VITALS — BP 157/81 | HR 109 | Temp 97.5°F | Resp 14 | Ht 64.5 in | Wt 194.0 lb

## 2018-06-04 VITALS — BP 139/80 | HR 83 | Temp 97.6°F | Resp 18 | Wt 194.6 lb

## 2018-06-04 DIAGNOSIS — Z170 Estrogen receptor positive status [ER+]: Secondary | ICD-10-CM

## 2018-06-04 DIAGNOSIS — Z7189 Other specified counseling: Secondary | ICD-10-CM

## 2018-06-04 DIAGNOSIS — Z79899 Other long term (current) drug therapy: Secondary | ICD-10-CM

## 2018-06-04 DIAGNOSIS — C50411 Malignant neoplasm of upper-outer quadrant of right female breast: Secondary | ICD-10-CM

## 2018-06-04 DIAGNOSIS — Z803 Family history of malignant neoplasm of breast: Secondary | ICD-10-CM | POA: Diagnosis not present

## 2018-06-04 DIAGNOSIS — C50911 Malignant neoplasm of unspecified site of right female breast: Secondary | ICD-10-CM

## 2018-06-04 MED ORDER — LIDOCAINE-PRILOCAINE 2.5-2.5 % EX CREA: TOPICAL_CREAM | CUTANEOUS | 0 refills | Status: DC

## 2018-06-04 NOTE — Progress Notes (Signed)
New patient visit, referred by Dr Jimmye Norman III for right female breast cancer.

## 2018-06-04 NOTE — Patient Instructions (Signed)
The patient is aware to call back for any questions or new concerns.  

## 2018-06-04 NOTE — Research (Signed)
I met with patient and her daughter after her appointment with Dr. Janese Banks. Dr. Janese Banks introduced research study "Procurement of Human Biospecimens for the Discovery and Validation of Biomarkers for the Prediction, Diagnosis and Management of Disease" sponsored by [employer] in conjunction with [employer]. I explained to patient that [employer] was asking for three vials of whole blood to use for research purposes only. They require sample be given before any treatment occurs for cancer (including surgery). [employer] will compensate the patient with a $50.00 gift card. I reviewed consent with patient including her patient rights to decline or withdraw from study at any time, risks and benefits to participation, confidentiality and process if patient wanted to consent. Patient  given the opportunity to ask questions. Patient decided to consent to study.  Patient agreed to give blood sample today although she had no other blood draw scheduled.  She signed consent IRB approved on 11/08/2017 and HIPPA from from [place] approved 10/15/2017. Patient was given a copy of both signed forms. Patient was escorted to lab and central labs were drawn.  [employer] provided gift card given to patient. Heather with [employer] notified of consent. [employer] contacted to transport sample to [employer] for processing. I thanked patient for her participation. She has my business card in case she has any questions.  Lula Olszewski Oncology Research Assistant 06/04/2018 3:18 PM

## 2018-06-04 NOTE — Progress Notes (Signed)
Patient ID: Theresa Reid, female   DOB: March 10, 1941, 78 y.o.   MRN: [id number]  Chief Complaint  Patient presents with  . New Patient (Initial Visit)     new pt ref Dr. Caryl Bis right breast CA mammo/BX [facility]    HPI Theresa Reid is a 78 y.o. female.  who presents for a breast evaluation referred by Dr Caryl Bis. The most recent right breast mammogram and ultrasound with biopsy was done on 05-28-18 showing breast cancer.  Patient does perform regular self breast checks and she has missed a few mammograms.   She could not feel anything different in the breast prior to the mammogram. She is here with her daughter, Theresa Reid. She is a Dietitian at [employer].  The patient has had significant arthritic symptoms in the past from which she is received marked benefit since beginning tumeric.  HPI  Past Medical History:  Diagnosis Date  . Arthritis   . Diverticulitis   . GERD (gastroesophageal reflux disease)   . Hyperlipidemia   . Hypertension     Past Surgical History:  Procedure Laterality Date  . BREAST BIOPSY Right 05/28/2018   [place] bx, INVASIVE MAMMARY CARCINOMA WITH LOBULAR FEATURES  . CHOLECYSTECTOMY    . [place]  . ovaraian cyst removal Right    Cyst only (NOT OVARY)  . TOTAL KNEE ARTHROPLASTY Bilateral 05/05/2010    Family History  Problem Relation Age of Onset  . Alcohol abuse Mother   . Heart disease Father   . Diabetes Father   . Breast cancer Maternal Aunt 30  . Breast cancer Maternal Aunt 69  . Kidney cancer Neg Hx   . Bladder Cancer Neg Hx     Social History Social History   Tobacco Use  . Smoking status: Never Smoker  . Smokeless tobacco: Never Used  Substance Use Topics  . Alcohol use: No    Alcohol/week: 0.0 standard drinks  . Drug use: No    No Known Allergies  Current Outpatient Medications  Medication Sig Dispense Refill  . amLODipine (NORVASC) 5 MG tablet TAKE 1 TABLET BY MOUTH  DAILY 90 tablet 1  .  atorvastatin (LIPITOR) 80 MG tablet TAKE 1 TABLET BY MOUTH  DAILY 90 tablet 1  . CALCIUM-VITAMIN D PO Take by mouth.    . CRANBERRY PO Take by mouth.    . Multiple Vitamins tablet Take by mouth.    . Omega-3 Fatty Acids (FISH OIL PO)     . oxybutynin (DITROPAN) 5 MG tablet TAKE 1 TABLET BY MOUTH TWO  TIMES DAILY 180 tablet 1  . pantoprazole (PROTONIX) 40 MG tablet TAKE 1 TABLET BY MOUTH  DAILY 90 tablet 1  . potassium chloride (K-DUR,KLOR-CON) 10 MEQ tablet TAKE 1 TABLET BY MOUTH  DAILY 90 tablet 1  . torsemide (DEMADEX) 5 MG tablet TAKE 1 TABLET BY MOUTH  DAILY 90 tablet 1  . Turmeric 500 MG CAPS Take 1,000 mg by mouth.    . lidocaine-prilocaine (EMLA) cream Apply to areola one hour before surgery. 5 g 0   No current facility-administered medications for this visit.     Review of Systems Review of Systems  Constitutional: Negative.   Respiratory: Negative.   Cardiovascular: Negative.     Blood pressure (!) 157/81, pulse (!) 109, temperature (!) 97.5 F (36.4 C), temperature source Temporal, resp. rate 14, height 5' 4.5" (1.638 m), weight 194 lb (88 kg), SpO2 95 %.  Physical Exam Physical Exam Constitutional:  Appearance: She is well-developed.  Eyes:     General: No scleral icterus.    Conjunctiva/sclera: Conjunctivae normal.  Neck:     Musculoskeletal: Neck supple.  Cardiovascular:     Rate and Rhythm: Normal rate and regular rhythm.     Heart sounds: Normal heart sounds.  Pulmonary:     Effort: Pulmonary effort is normal.     Breath sounds: Normal breath sounds.  Chest:     Breasts:        Right: No inverted nipple, mass, nipple discharge, skin change or tenderness.        Left: No inverted nipple, mass, nipple discharge, skin change or tenderness.    Lymphadenopathy:     Cervical: No cervical adenopathy.     Upper Body:     Right upper body: No supraclavicular or axillary adenopathy.     Left upper body: No supraclavicular or axillary adenopathy.  Skin:     General: Skin is warm and dry.  Neurological:     Mental Status: She is alert and oriented to person, place, and time.  Psychiatric:        Behavior: Behavior normal.     Data Reviewed The patient's case was reviewed at the [facility] Breast Tumor Conference last evening. Mammograms and ultrasound from January 15 through May 30, 2018 were reviewed.  Prior mammogram was 5 years earlier.  May 28, 2018 biopsy results: A. BREAST, RIGHT, UPPER OUTER AT 11 O'CLOCK, 6 CM FROM NIPPLE;  ULTRASOUND-GUIDED CORE BIOPSY:  - INVASIVE MAMMARY CARCINOMA WITH LOBULAR FEATURES.   Size of invasive carcinoma: 4 mm in this sample  Histologic grade of invasive carcinoma: Grade 1            Glandular/tubular differentiation score: 3           Nuclear pleomorphism score: 1            Mitotic rate score: 1            Total score: 5  Ductal carcinoma in situ: Not identified  Lymphovascular invasion: Not identified  BREAST BIOMARKER TESTS  Estrogen Receptor (ER) Status: POSITIVE            Percentage of cells with nuclear positivity: >90%            Average intensity of staining: Strong   Progesterone Receptor (PgR) Status: POSITIVE            Percentage of cells with nuclear positivity: >90%            Average intensity of staining: Strong   HER2 (by immunohistochemistry): NEGATIVE (score 0)   Ultrasound of the right breast was completed to confirm the area was be visible for wide local excision should the patient choose breast conservation.  In the 11 o'clock position approximately 8 cm from the nipple a hypoechoic mass taller than it was wide measuring 0.5 x 0.7 x 1.05 cm is noted.  A biopsy clip is noted at the superior aspect of the lesion.  BI-RADS-6.  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    Plan    In spite of the lobular features the breasts are  essentially fatty replaced and based on last night's review it is not felt that the patient would be a candidate for preoperative MRI.  This procedure, presently scheduled for next week was reviewed with both the medical oncologist and PCP and both are comfortable with having the MRI canceled.  We spent about 45 minutes reviewing options for management, in part to help clarify her in-depth review of options completed earlier in the day with Dr. Janese Banks from medical oncology.  Breast conservation and mastectomy were presented is equivalent procedures.  The patient has a 42 double D breast, and mastectomy for the small low-grade lesion would certainly produce a significant asymmetry.  She would however be an excellent candidate for breast conservation.  The indications for post excision radiation therapy are certainly in flux at this time, especially at age over 41.  Some institutions are restricting it for people over age 8.  Depending on the final pathology, size and axillary nodal status this will help with some of the decision making.  She will likely benefit from a postsurgical consultation with radiation oncology for their opinion.  The opportunity for second surgical opinion was reviewed.  The likelihood of recommendation for antiestrogen therapy post surgery was reviewed.  At this time, unless there is something dramatically different in terms of size histology or nodal status it is unlikely the patient will require adjuvant chemotherapy.  The patient at this time is comfortable proceeding to surgical scheduling, due to the late hour this will be completed tomorrow by phone.     HPI, assessment, plan and physical exam has been scribed under the direction and in the presence of Robert Bellow, MD. Karie Fetch, RN  I have completed the exam and reviewed the above documentation for accuracy and completeness.  I agree with the above.  Haematologist has been used and any errors in dictation or  transcription are unintentional.  Hervey Ard, M.D., F.A.C.S.  Forest Gleason Byrnett 06/04/2018, 8:58 PM

## 2018-06-05 ENCOUNTER — Other Ambulatory Visit: Payer: Self-pay | Admitting: *Deleted

## 2018-06-05 ENCOUNTER — Telehealth: Payer: Self-pay | Admitting: Genetic Counselor

## 2018-06-05 ENCOUNTER — Telehealth: Payer: Self-pay | Admitting: *Deleted

## 2018-06-05 DIAGNOSIS — C50411 Malignant neoplasm of upper-outer quadrant of right female breast: Secondary | ICD-10-CM

## 2018-06-05 DIAGNOSIS — Z170 Estrogen receptor positive status [ER+]: Principal | ICD-10-CM

## 2018-06-05 NOTE — Telephone Encounter (Signed)
Patient was contacted today about scheduling surgery with Dr. Bary Castilla.  Patient's surgery to be scheduled for 06-12-18 at [facility].   The patient is will be contacted by the [place] Department to complete a phone interview sometime in the near future.  The patient is aware to call the office should she have further questions.   Patient will be contacted once surgery is posted with [facility] to inform her of arrival time day of surgery.

## 2018-06-05 NOTE — Telephone Encounter (Signed)
Dr. Janese Banks is referring Theresa Reid for genetic counseling due to a personal and family history of breast cancer. I spoke with her to schedule this appointment and she asked for 8am on Thursday 06/06/18.  Steele Berg, [place], [place] Genetic Counselor Phone: [phone]

## 2018-06-06 ENCOUNTER — Other Ambulatory Visit: Payer: Self-pay | Admitting: General Surgery

## 2018-06-06 ENCOUNTER — Telehealth: Payer: Self-pay | Admitting: *Deleted

## 2018-06-06 ENCOUNTER — Encounter: Payer: Self-pay | Admitting: Genetic Counselor

## 2018-06-06 ENCOUNTER — Telehealth: Payer: Self-pay | Admitting: Genetic Counselor

## 2018-06-06 DIAGNOSIS — C50411 Malignant neoplasm of upper-outer quadrant of right female breast: Secondary | ICD-10-CM

## 2018-06-06 DIAGNOSIS — Z170 Estrogen receptor positive status [ER+]: Principal | ICD-10-CM

## 2018-06-06 DIAGNOSIS — Z803 Family history of malignant neoplasm of breast: Secondary | ICD-10-CM | POA: Insufficient documentation

## 2018-06-06 NOTE — Telephone Encounter (Signed)
Cancer Genetics            Telegenetics Initial Visit    Patient Name: Theresa Reid Patient DOB: 23-Jan-1941 Patient Age: 78 y.o. Phone Call Date: 06/06/2018  Referring Provider: Randa Evens, MD  Reason for Visit: Evaluate for hereditary susceptibility to cancer    Assessment and Plan:  . Theresa Reid' personal history of breast cancer at age 12 is not suggestive of a hereditary predisposition to cancer. However, because she has 2 maternal aunts with breast cancer, a genetics evaluation is reasonable. We discussed that the likelihood of finding a mutation is very low.   . Testing is available to determine whether she has a pathogenic mutation that will impact her screening and risk-reduction for cancer. A negative result will be reassuring.  . Theresa Reid wished to pursue genetic testing, but first wanted to know whether her insurance will cover testing costs. I will submit information to [employer] to check her benefits and will communicate that to her once available. If she proceeds with testing, analysis will include the 84 genes on [facility]'s Multi-Cancer panel (AIP, ALK, APC, ATM, AXIN2, BAP1, BARD1, BLM, BMPR1A, BRCA1, BRCA2, BRIP1, CASR, CDC73, CDH1, CDK4, CDKN1B, CDKN1C, CDKN2A, CEBPA, CHEK2, CTNNA1, DICER1, DIS3L2, EGFR, EPCAM, FH, FLCN, GATA2, GPC3, GREM1, HOXB13, HRAS, KIT, MAX, MEN1, MET, MITF, MLH1, MSH2, MSH3, MSH6, MUTYH, NBN, NF1, NF2, NTHL1, PALB2, PDGFRA, PHOX2B, PMS2, POLD1, POLE, POT1, PRKAR1A, PTCH1, PTEN, RAD50, RAD51C, RAD51D, RB1, RECQL4, RET, RUNX1, SDHA, SDHAF2, SDHB, SDHC, SDHD, SMAD4, SMARCA4, SMARCB1, SMARCE1, STK11, SUFU, TERC, TERT, TMEM127, TP53, TSC1, TSC2, VHL, WRN, WT1).   . Once the lab receives her specimen, results should be available in approximately 2-3 weeks, at which point I will contact her and address implications for her as well as address genetic testing for at-risk family members, if needed.     Theresa Reid' questions were  answered to her satisfaction today and she is welcome to call with any additional questions or concerns. Thank you for the referral and allowing [place] to share in the care of your patient.    Dr. Grayland Ormond was available for questions concerning this case. Total time spent by counseling by phone was approximately 25 minutes.   _____________________________________________________________________   History of Present Illness: [name], a 78 y.o. female, was referred for genetic counseling to discuss the possibility of a hereditary predisposition to cancer and discuss whether genetic testing is warranted. This was a telegenetics visit via phone.  [name] was recently diagnosed with breast cancer at the age of 16. She is scheduled for a lumpectomy next week.  She does not have any other personal history of cancer.  Past Medical History:  Diagnosis Date  . Arthritis   . Diverticulitis   . Family history of breast cancer   . GERD (gastroesophageal reflux disease)   . Hyperlipidemia   . Hypertension     Past Surgical History:  Procedure Laterality Date  . BREAST BIOPSY Right 05/28/2018   [place] bx, INVASIVE MAMMARY CARCINOMA WITH LOBULAR FEATURES  . CHOLECYSTECTOMY    . [place]  . ovaraian cyst removal Right    Cyst only (NOT OVARY)  . TOTAL KNEE ARTHROPLASTY Bilateral 05/05/2010    Family History: Significant diagnoses include the following:  Family History  Problem Relation Age of Onset  . Alcohol abuse Mother        deceased 46  . Heart disease  Father        deceased 37  . Diabetes Father   . Breast cancer Maternal Aunt        dx 84s; deceased 78s  . Breast cancer Maternal Aunt        dx 18s; deceased 71s   Additionally, [name] has 2 daughters (ages 69 and 73). She has no siblings. Her mother had 2 sisters (noted above) and a brother. Her father had 8 siblings. She does not know much information about them.  [name]'s ancestry is  Caucasian - NOS. There is no known Jewish ancestry and no consanguinity.  Discussion: We reviewed the characteristics, features and inheritance patterns of hereditary cancer syndromes. We discussed her risk of harboring a mutation in the context of her personal and family history. We discussed the process of genetic testing, insurance coverage and implications of results: positive, negative and variant of uncertain significance (VUS).     Steele Berg, MS, [place] Certified Genetic Counselor phone: [phone]

## 2018-06-06 NOTE — Telephone Encounter (Signed)
Patient's surgery has been scheduled for 06-12-18 at [facility] with Dr. Bary Castilla. The patient is aware to check in at the radioology desk at 9:45 am day of surgery. Patient reminded of EMLA cream instructions.   The patient is aware she will be contacted by the [facility] to complete a phone interview between 9 am and 1 pm on 06-11-18.  The patient is aware to call the office should she have further questions.

## 2018-06-06 NOTE — Progress Notes (Signed)
  Oncology Nurse Navigator Documentation  Navigator Location: [facility] (06/06/18 1000) Referral date to RadOnc/MedOnc: 06/04/18 (06/06/18 1000) )Navigator Encounter Type: Initial MedOnc (06/06/18 1000)       Surgery Date: 06/12/18 (06/06/18 1000) Genetic Counseling Date: 06/04/18 (06/06/18 1000) Genetic Counseling Type: Urgent (06/06/18 1000)         Patient Visit Type: Initial (06/06/18 1000) Treatment Phase: Pre-Tx/Tx Discussion (06/06/18 1000) Barriers/Navigation Needs: Education (06/06/18 1000) Education: Newly Diagnosed Cancer Education (06/06/18 1000) Interventions: Coordination of Care;Education (06/06/18 1000)     Education Method: Verbal;Written (06/06/18 1000)  Support Groups/Services: Breast Support Group (06/06/18 1000)             Time Spent with Patient: 60 (06/06/18 1000)   Met with patient and her daughter during her initial medical oncology consult with Dr. Janese Banks.  Gave patient breast cancer educational literature, "My Breast Cancer Treatment Handbook" by Josephine Igo, RN.  Informed of cancer center resources.  She is to call if she has any questions or needs.

## 2018-06-07 ENCOUNTER — Encounter: Payer: Self-pay | Admitting: Oncology

## 2018-06-07 ENCOUNTER — Telehealth: Payer: Self-pay

## 2018-06-07 DIAGNOSIS — Z7189 Other specified counseling: Secondary | ICD-10-CM | POA: Insufficient documentation

## 2018-06-07 DIAGNOSIS — C50919 Malignant neoplasm of unspecified site of unspecified female breast: Secondary | ICD-10-CM | POA: Insufficient documentation

## 2018-06-07 DIAGNOSIS — C50911 Malignant neoplasm of unspecified site of right female breast: Secondary | ICD-10-CM | POA: Insufficient documentation

## 2018-06-07 NOTE — Progress Notes (Signed)
Hematology/Oncology Consult note [facility] Telephone:([phone] [phone]  Patient Care Team: Leone Haven, MD as PCP - General (Family Medicine)   Name of the patient: Theresa Reid  [id number]  1941/04/09    Reason for referral- new diagnosis of breast cancer    Referring physician- Dr. Caryl Bis  Date of visit: 06/07/18   History of presenting illness-patient is a 78 year old female with no prior history of abnormal mammograms.  She recently underwent a screening mammogram on 05/22/2018 which showed Suspicious mass in the 11 o'clock position of the right breast about 1 cm in size.  Normal appearing axilla.  She underwent a core biopsy which showed invasive mammary carcinoma with lobular features.  4 mm, grade 1, ER greater than 90% positive, PR greater than 90% positive and HER-2/neu negative.  She had menarche at the age of 51.  She has 2 children.  Menopause around 38 years of age.  She used birth control briefly in the 2s.  No hormone replacement therapy.  Family history significant for maternal aunts 2 of them who had breast cancer and her great grandmother who had breast cancer.  She lives alone and is independent of her ADLs.  ECOG PS- 1  Pain scale- 0   Review of systems- Review of Systems  Constitutional: Negative for chills, fever, malaise/fatigue and weight loss.  HENT: Negative for congestion, ear discharge and nosebleeds.   Eyes: Negative for blurred vision.  Respiratory: Negative for cough, hemoptysis, sputum production, shortness of breath and wheezing.   Cardiovascular: Negative for chest pain, palpitations, orthopnea and claudication.  Gastrointestinal: Negative for abdominal pain, blood in stool, constipation, diarrhea, heartburn, melena, nausea and vomiting.  Genitourinary: Negative for dysuria, flank pain, frequency, hematuria and urgency.  Musculoskeletal: Negative for back pain, joint pain and myalgias.  Skin:  Negative for rash.  Neurological: Negative for dizziness, tingling, focal weakness, seizures, weakness and headaches.  Endo/Heme/Allergies: Does not bruise/bleed easily.  Psychiatric/Behavioral: Negative for depression and suicidal ideas. The patient does not have insomnia.     No Known Allergies  Patient Active Problem List   Diagnosis Date Noted  . Family history of breast cancer   . Dairy product intolerance 01/14/2018  . Seborrheic keratosis 04/10/2017  . Dysuria 02/19/2017  . Urethral polyp 10/17/2016  . Visual disturbance 10/03/2016  . GERD (gastroesophageal reflux disease) 09/14/2016  . HLD (hyperlipidemia) 08/13/2015  . Elevated alkaline phosphatase level 02/14/2015  . Hypertension 11/13/2014  . 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 Arthritis   . Diverticulitis   . Family history of breast cancer   . GERD (gastroesophageal reflux disease)   . Hyperlipidemia   . Hypertension      Past Surgical History:  Procedure Laterality Date  . BREAST BIOPSY Right 05/28/2018   [place] bx, INVASIVE MAMMARY CARCINOMA WITH LOBULAR FEATURES  . CHOLECYSTECTOMY    . [place]  . ovaraian cyst removal Right    Cyst only (NOT OVARY)  . TOTAL KNEE ARTHROPLASTY Bilateral 05/05/2010    Social History   Socioeconomic History  . Marital status: Single    Spouse name: Not on file  . Number of children: Not on file  . Years of education: Not on file  . Highest education level: Not on file  Occupational History  . Occupation: retired  Scientific laboratory technician  . Financial resource strain: Not hard at all  . Food insecurity:  Worry: Never true    Inability: Never true  . Transportation needs:    Medical: No    Non-medical: No  Tobacco Use  . Smoking status: Never Smoker  . Smokeless tobacco: Never Used  Substance and Sexual Activity  . Alcohol use: No    Alcohol/week: 0.0 standard drinks  . Drug use: No  . Sexual  activity: Never  Lifestyle  . Physical activity:    Days per week: Not on file    Minutes per session: Not on file  . Stress: Not at all  Relationships  . Social connections:    Talks on phone: Not on file    Gets together: Not on file    Attends religious service: Not on file    Active member of club or organization: Not on file    Attends meetings of clubs or organizations: Not on file    Relationship status: Not on file  . Intimate partner violence:    Fear of current or ex partner: Not on file    Emotionally abused: Not on file    Physically abused: Not on file    Forced sexual activity: Not on file  Other Topics Concern  . Not on file  Social History Narrative   Retired    Lives by herself    Pets: None   Caffeine- Coffee 2 cups daily, no tea/soda        Family History  Problem Relation Age of Onset  . Alcohol abuse Mother        deceased 54  . Heart disease Father        deceased 36  . Diabetes Father   . Breast cancer Maternal Aunt        dx 57s; deceased 68s  . Breast cancer Maternal Aunt        dx 47s; deceased 80s     Current Outpatient Medications:  .  amLODipine (NORVASC) 5 MG tablet, TAKE 1 TABLET BY MOUTH  DAILY (Patient taking differently: Take 5 mg by mouth daily. ), Disp: 90 tablet, Rfl: 1 .  atorvastatin (LIPITOR) 80 MG tablet, TAKE 1 TABLET BY MOUTH  DAILY (Patient taking differently: Take 80 mg by mouth every evening. ), Disp: 90 tablet, Rfl: 1 .  CALCIUM-VITAMIN D PO, Take 1 tablet by mouth daily. , Disp: , Rfl:  .  CRANBERRY PO, Take 1 tablet by mouth 2 (two) times daily. , Disp: , Rfl:  .  Multiple Vitamins tablet, Take 1 tablet by mouth daily. , Disp: , Rfl:  .  Omega-3 Fatty Acids (FISH OIL PO), Take 1 capsule by mouth daily. , Disp: , Rfl:  .  oxybutynin (DITROPAN) 5 MG tablet, TAKE 1 TABLET BY MOUTH TWO  TIMES DAILY (Patient taking differently: Take 5 mg by mouth 2 (two) times daily. ), Disp: 180 tablet, Rfl: 1 .  pantoprazole (PROTONIX)  40 MG tablet, TAKE 1 TABLET BY MOUTH  DAILY (Patient taking differently: Take 40 mg by mouth daily. ), Disp: 90 tablet, Rfl: 1 .  potassium chloride (K-DUR,KLOR-CON) 10 MEQ tablet, TAKE 1 TABLET BY MOUTH  DAILY (Patient taking differently: Take 10 mEq by mouth daily. ), Disp: 90 tablet, Rfl: 1 .  torsemide (DEMADEX) 5 MG tablet, TAKE 1 TABLET BY MOUTH  DAILY (Patient taking differently: Take 5 mg by mouth daily. ), Disp: 90 tablet, Rfl: 1 .  Turmeric 500 MG CAPS, Take 1,000 mg by mouth daily. , Disp: , Rfl:  .  lidocaine-prilocaine (EMLA)  cream, Apply to areola one hour before surgery., Disp: 5 g, Rfl: 0   Physical exam:  Vitals:   06/04/18 1348  BP: 139/80  Pulse: 83  Resp: 18  Temp: 97.6 F (36.4 C)  TempSrc: Tympanic  Weight: 194 lb 9 oz (88.3 kg)   Physical Exam Constitutional:      General: She is not in acute distress. HENT:     Head: Normocephalic and atraumatic.  Eyes:     Pupils: Pupils are equal, round, and reactive to light.  Neck:     Musculoskeletal: Normal range of motion.  Cardiovascular:     Rate and Rhythm: Normal rate and regular rhythm.     Heart sounds: Normal heart sounds.  Pulmonary:     Effort: Pulmonary effort is normal.     Breath sounds: Normal breath sounds.  Abdominal:     General: Bowel sounds are normal.     Palpations: Abdomen is soft.  Skin:    General: Skin is warm and dry.  Neurological:     Mental Status: She is alert and oriented to person, place, and time.    Breast: No palpable mass in either breast.  No palpable bilateral axillary adenopathy   CMP Latest Ref Rng & Units 01/14/2018  Glucose 70 - 99 mg/dL 116(H)  BUN 6 - 23 mg/dL 9  Creatinine 0.40 - 1.20 mg/dL 0.64  Sodium 135 - 145 mEq/L 140  Potassium 3.5 - 5.1 mEq/L 4.0  Chloride 96 - 112 mEq/L 100  CO2 19 - 32 mEq/L 31  Calcium 8.4 - 10.5 mg/dL 9.7  Total Protein 6.0 - 8.3 g/dL 7.5  Total Bilirubin 0.2 - 1.2 mg/dL 0.5  Alkaline Phos 39 - 117 U/L 100  AST 0 - 37 U/L 16    ALT 0 - 35 U/L 16   CBC Latest Ref Rng & Units 09/27/2016  WBC 3.6 - 11.0 K/uL 8.5  Hemoglobin 12.0 - 16.0 g/dL 13.8  Hematocrit 35.0 - 47.0 % 41.6  Platelets 150 - 440 K/uL 310    No images are attached to the encounter.  US Breast Complete Uni Right Inc Axilla  Result Date: 06/04/2018 Ultrasound of the right breast was completed to confirm the area was be visible for wide local excision should the patient choose breast conservation.  In the 11 o'clock position approximately 8 cm from the nipple a hypoechoic mass taller than it was wide measuring 0.5 x 0.7 x 1.05 cm is noted. A biopsy clip is noted at the superior aspect of the lesion.  BI-RADS-6.  US Breast Ltd Uni Right Inc Axilla  Result Date: 05/22/2018 CLINICAL DATA:  Right breast upper outer quadrant possible mass seen on most recent screening mammography. EXAM: DIGITAL DIAGNOSTIC RIGHT MAMMOGRAM WITH CAD AND TOMO ULTRASOUND RIGHT BREAST COMPARISON:  Previous exam(s). ACR Breast Density Category b: There are scattered areas of fibroglandular density. FINDINGS: Additional mammographic views of the right breast demonstrates persistent spiculated mass containing few round calcifications in the right breast upper outer quadrant, anterior to middle depth. The mass measures 8 mm in greatest dimension mammographically. Mammographic images were processed with CAD. On physical exam, no suspicious masses are palpated. Targeted ultrasound is performed, showing right breast 11 o'clock 6 cm from the nipple hypoechoic irregular shadowing mass which measures 0.9 x 1.0 x 1.1 cm. This finding corresponds to the mammographically seen irregular calcifications containing mass. Sonographically, there is no evidence of right axillary lymphadenopathy. IMPRESSION: Right breast 11 o'clock suspicious mass, for which  ultrasound-guided core needle biopsy is recommended. RECOMMENDATION: Ultrasound-guided core needle biopsy of the right breast. I have discussed the  findings and recommendations with the patient. Results were also provided in writing at the conclusion of the visit. If applicable, a reminder letter will be sent to the patient regarding the next appointment. BI-RADS CATEGORY  4: Suspicious. Electronically Signed   By: Fidela Salisbury M.D.   On: 05/22/2018 15:51   Mm Diag Breast Tomo Uni Right  Result Date: 05/22/2018 CLINICAL DATA:  Right breast upper outer quadrant possible mass seen on most recent screening mammography. EXAM: DIGITAL DIAGNOSTIC RIGHT MAMMOGRAM WITH CAD AND TOMO ULTRASOUND RIGHT BREAST COMPARISON:  Previous exam(s). ACR Breast Density Category b: There are scattered areas of fibroglandular density. FINDINGS: Additional mammographic views of the right breast demonstrates persistent spiculated mass containing few round calcifications in the right breast upper outer quadrant, anterior to middle depth. The mass measures 8 mm in greatest dimension mammographically. Mammographic images were processed with CAD. On physical exam, no suspicious masses are palpated. Targeted ultrasound is performed, showing right breast 11 o'clock 6 cm from the nipple hypoechoic irregular shadowing mass which measures 0.9 x 1.0 x 1.1 cm. This finding corresponds to the mammographically seen irregular calcifications containing mass. Sonographically, there is no evidence of right axillary lymphadenopathy. IMPRESSION: Right breast 11 o'clock suspicious mass, for which ultrasound-guided core needle biopsy is recommended. RECOMMENDATION: Ultrasound-guided core needle biopsy of the right breast. I have discussed the findings and recommendations with the patient. Results were also provided in writing at the conclusion of the visit. If applicable, a reminder letter will be sent to the patient regarding the next appointment. BI-RADS CATEGORY  4: Suspicious. Electronically Signed   By: Fidela Salisbury M.D.   On: 05/22/2018 15:51   Mm 3d Screen Breast  Bilateral  Result Date: 05/08/2018 CLINICAL DATA:  Screening. EXAM: DIGITAL SCREENING BILATERAL MAMMOGRAM WITH TOMO AND CAD COMPARISON:  Previous exam(s). ACR Breast Density Category b: There are scattered areas of fibroglandular density. FINDINGS: In the right breast, a possible mass warrants further evaluation. In the left breast, no findings suspicious for malignancy. Images were processed with CAD. IMPRESSION: Further evaluation is suggested for possible mass in the right breast. RECOMMENDATION: Diagnostic mammogram and possibly ultrasound of the right breast. (Code:[id number]) The patient will be contacted regarding the findings, and additional imaging will be scheduled. BI-RADS CATEGORY  0: Incomplete. Need additional imaging evaluation and/or prior mammograms for comparison. Electronically Signed   By: Lillia Mountain M.D.   On: 05/08/2018 15:14   Mm Clip Placement Right  Result Date: 05/28/2018 CLINICAL DATA:  Evaluate biopsy marker EXAM: DIAGNOSTIC RIGHT MAMMOGRAM POST ULTRASOUND BIOPSY COMPARISON:  Previous exam(s). FINDINGS: Mammographic images were obtained following ultrasound guided biopsy of a right breast mass. The ribbon shaped clip is at the site of the biopsied mass. IMPRESSION: The ribbon shaped clip is at the site of the biopsied mass. Final Assessment: Post Procedure Mammograms for Marker Placement Electronically Signed   By: Dorise Bullion III M.D   On: 05/28/2018 13:42   [place] Rt Breast Bx W Loc Dev 1st Lesion Img Bx Spec US Guide  Addendum Date: 05/30/2018   ADDENDUM REPORT: 05/29/2018 16:22 ADDENDUM: Pathology of the right breast biopsy revealed A. BREAST, RIGHT, UPPER OUTER AT 11 O'CLOCK, 6 CM FROM NIPPLE; ULTRASOUND-GUIDED CORE BIOPSY: INVASIVE MAMMARY CARCINOMA WITH LOBULAR FEATURES. Size of invasive carcinoma: 4 mm in this sample. Histologic grade of invasive carcinoma: Grade 1. Ductal carcinoma in situ: Not  identified. Lymphovascular invasion: Not identified. ER/PR/HER2:  Immunohistochemistry will be performed on block A1, with reflex to [place] for HER2 2+. The results will be reported in an addendum. Comment: The definitive grade will be assigned on the excisional specimen. These findings were communicated to Jetta Lout in Dr. Dorise Bullion' office on 05/29/2018 via [place]. Message receipt was confirmed. This was found to be concordant by Dr. Jimmye Norman. Recommendation: Surgical and oncology referrals. Also recommend bilateral breast MRI without and with contrast due to the lobular features. At the patient's request, results and recommendations were relayed to the patient by phone by Jetta Lout, [place] on 05/29/2018. The patient stated she did well following the biopsy with no bleeding, bruising or pain. Post biopsy instructions were reviewed with the patient and all of her questions were answered. Request for referrals was sent to the nurse navigators for [facility] by [place], [place] on 05/29/2018. The patient was contacted by Tanya Nones, RN, nurse navigator. Appointments were made with Dr. Bary Castilla on 06/04/2018 at 4:30 PM and Dr. Janese Banks, oncology on 06/04/2018 at 1:30 PM. The patient was notified of the appointments and encouraged to contact Tanya Nones, RN with any further questions or concerns. Addendum by Jetta Lout, RRA on 05/30/2018. Electronically Signed   By: Dorise Bullion III M.D   On: 05/29/2018 16:22   Result Date: 05/30/2018 CLINICAL DATA:  Biopsy of a right breast mass EXAM: ULTRASOUND GUIDED RIGHT BREAST CORE NEEDLE BIOPSY COMPARISON:  Previous exam(s). FINDINGS: I met with the patient and we discussed the procedure of ultrasound-guided biopsy, including benefits and alternatives. We discussed the high likelihood of a successful procedure. We discussed the risks of the procedure, including infection, bleeding, tissue injury, clip migration, and inadequate sampling. Informed written consent was given. The usual time-out protocol was  performed immediately prior to the procedure. Lesion quadrant: Upper-outer Using sterile technique and 1% Lidocaine as local anesthetic, under direct ultrasound visualization, a 12 gauge spring-loaded device was used to perform biopsy of a right breast mass at 11 o'clock using a lateral approach. At the conclusion of the procedure a tissue marker clip was deployed into the biopsy cavity. Follow up 2 view mammogram was performed and dictated separately. IMPRESSION: Ultrasound guided biopsy of a right breast mass. No apparent complications. Electronically Signed: By: Dorise Bullion III M.D On: 05/28/2018 13:32    Assessment and plan- Patient is a 78 y.o. female with newly diagnosed clinical prognostic stage Ia invasive mammary carcinoma with lobular features cT1 cN0 cM0 ER PR positive HER-2/neu negative on core biopsy  I discussed the results of the mammogram and ultrasound with the patient in detail.  Patient likely has lobular carcinoma based on core biopsy and we will have to await lumpectomy and sentinel lymph node biopsy results to ascertain final pathology.  We discussed her case at tumor board as well.  Given the fact that patient has large fatty breasts there was no added advantage of getting MRI done despite lobular histology.  I will therefore hold off on getting MRI at this time.  I discussed that if the final pathology size is greater than 1 cm with grade 1 histology or 0.5 cm with grade 2 or grade 3 histology-we could consider sending Oncotype testing to see if she would benefit from adjuvant chemotherapy.  Patient will like to try adjuvant chemotherapy if it would benefit her.  I discussed what Oncotype testing is and how the results are interpreted.  If she falls  in the low or intermediate risk group with a score of 25 or less she would not benefit from chemotherapy.  Adjuvant chemotherapy would be indicated for a score of 26 or higher.  Given that her tumor is strongly ER PR positive there  would be a role for adjuvant hormone therapy for 5 years which I will discuss with her in greater detail at her next visit.  I will tentatively see her on 06/27/2018 along with radiation oncology who will discuss if she would benefit from adjuvant radiation treatment.  Treatment will be given with a curative intent  Cancer Staging Malignant neoplasm of right female breast [facility]) Staging form: Breast, AJCC 8th Edition - Clinical stage from 06/04/2018: Stage IA (cT1c, cN0, cM0, G1, ER+, PR+, HER2-) - Signed by Sindy Guadeloupe, MD on 06/07/2018    Thank you for this kind referral and the opportunity to participate in the care of this patient   Visit Diagnosis 1. Malignant neoplasm of right female breast, unspecified estrogen receptor status, unspecified site of breast ([place])   2. Goals of care, counseling/discussion     Dr. Randa Evens, MD, MPH [facility] at [facility] [phone] 06/07/2018 1:03 PM

## 2018-06-07 NOTE — Telephone Encounter (Signed)
Daughter notified of instructions. Prescription has been sent into the pharmacy.

## 2018-06-07 NOTE — Telephone Encounter (Signed)
-----   Message from Robert Bellow, MD sent at 06/06/2018  8:55 PM EST ----- Please contact the patient and her daughter about making use of EMLA cream to the areola of the right breast prior to her sentinel node injection and send a prescription for 5 g of the same to her pharmacy.  Thank you

## 2018-06-11 ENCOUNTER — Other Ambulatory Visit: Payer: Self-pay

## 2018-06-11 ENCOUNTER — Encounter
Admission: RE | Admit: 2018-06-11 | Discharge: 2018-06-11 | Disposition: A | Discharge status: Home / Self Care | Payer: Medicare Other | Source: Ambulatory Visit | Attending: General Surgery | Admitting: General Surgery

## 2018-06-12 ENCOUNTER — Ambulatory Visit
Admission: RE | Admit: 2018-06-12 | Discharge: 2018-06-12 | Disposition: A | Discharge status: Home / Self Care | Payer: Medicare Other | Attending: General Surgery | Admitting: General Surgery

## 2018-06-12 ENCOUNTER — Ambulatory Visit: Payer: Medicare Other

## 2018-06-12 ENCOUNTER — Other Ambulatory Visit: Payer: Self-pay

## 2018-06-12 ENCOUNTER — Ambulatory Visit: Payer: Medicare Other | Admitting: Anesthesiology

## 2018-06-12 ENCOUNTER — Encounter
Admission: RE | Disposition: A | Discharge status: Home / Self Care | Payer: Self-pay | Source: Home / Self Care | Attending: General Surgery

## 2018-06-12 ENCOUNTER — Ambulatory Visit
Admission: RE | Admit: 2018-06-12 | Discharge: 2018-06-12 | Disposition: A | Discharge status: Home / Self Care | Payer: Medicare Other | Source: Ambulatory Visit | Attending: General Surgery | Admitting: General Surgery

## 2018-06-12 DIAGNOSIS — Z803 Family history of malignant neoplasm of breast: Secondary | ICD-10-CM | POA: Diagnosis not present

## 2018-06-12 DIAGNOSIS — C773 Secondary and unspecified malignant neoplasm of axilla and upper limb lymph nodes: Secondary | ICD-10-CM | POA: Diagnosis not present

## 2018-06-12 DIAGNOSIS — Z79899 Other long term (current) drug therapy: Secondary | ICD-10-CM | POA: Insufficient documentation

## 2018-06-12 DIAGNOSIS — Z170 Estrogen receptor positive status [ER+]: Principal | ICD-10-CM

## 2018-06-12 DIAGNOSIS — E785 Hyperlipidemia, unspecified: Secondary | ICD-10-CM | POA: Insufficient documentation

## 2018-06-12 DIAGNOSIS — K219 Gastro-esophageal reflux disease without esophagitis: Secondary | ICD-10-CM | POA: Diagnosis not present

## 2018-06-12 DIAGNOSIS — C50911 Malignant neoplasm of unspecified site of right female breast: Secondary | ICD-10-CM | POA: Diagnosis not present

## 2018-06-12 DIAGNOSIS — I10 Essential (primary) hypertension: Secondary | ICD-10-CM | POA: Insufficient documentation

## 2018-06-12 DIAGNOSIS — C50411 Malignant neoplasm of upper-outer quadrant of right female breast: Secondary | ICD-10-CM

## 2018-06-12 DIAGNOSIS — N6310 Unspecified lump in the right breast, unspecified quadrant: Secondary | ICD-10-CM

## 2018-06-12 DIAGNOSIS — N6311 Unspecified lump in the right breast, upper outer quadrant: Secondary | ICD-10-CM | POA: Diagnosis not present

## 2018-06-12 HISTORY — PX: BREAST LUMPECTOMY: SHX2

## 2018-06-12 HISTORY — PX: BREAST LUMPECTOMY WITH SENTINEL LYMPH NODE BIOPSY: SHX5597

## 2018-06-12 LAB — CBC
HCT: 42.2 % (ref 36.0–46.0)
HEMOGLOBIN: 13.7 g/dL (ref 12.0–15.0)
MCH: 29.7 pg (ref 26.0–34.0)
MCHC: 32.5 g/dL (ref 30.0–36.0)
MCV: 91.5 fL (ref 80.0–100.0)
Platelets: 246 10*3/uL (ref 150–400)
RBC: 4.61 MIL/uL (ref 3.87–5.11)
RDW: 14 % (ref 11.5–15.5)
WBC: 8.7 10*3/uL (ref 4.0–10.5)
nRBC: 0 % (ref 0.0–0.2)

## 2018-06-12 LAB — BASIC METABOLIC PANEL
Anion gap: 8 (ref 5–15)
BUN: 7 mg/dL — ABNORMAL LOW (ref 8–23)
CO2: 27 mmol/L (ref 22–32)
Calcium: 9.1 mg/dL (ref 8.9–10.3)
Chloride: 104 mmol/L (ref 98–111)
Creatinine, Ser: 0.48 mg/dL (ref 0.44–1.00)
GFR calc Af Amer: >60 mL/min (ref >60)
GFR calc non Af Amer: >60 mL/min (ref >60)
GLUCOSE: 108 mg/dL — AB (ref 70–99)
Potassium: 3.5 mmol/L (ref 3.5–5.1)
Sodium: 139 mmol/L (ref 135–145)

## 2018-06-12 SURGERY — BREAST LUMPECTOMY WITH SENTINEL LYMPH NODE BX
Anesthesia: General | Site: Breast | Laterality: Right

## 2018-06-12 MED ORDER — PROPOFOL 10 MG/ML IV BOLUS
INTRAVENOUS | Status: AC
  Filled 2018-06-12: qty 20

## 2018-06-12 MED ORDER — LIDOCAINE HCL (PF) 2 % IJ SOLN
INTRAMUSCULAR | Status: AC
  Filled 2018-06-12: qty 10

## 2018-06-12 MED ORDER — FENTANYL CITRATE (PF) 100 MCG/2ML IJ SOLN
INTRAMUSCULAR | Status: AC
  Filled 2018-06-12: qty 2

## 2018-06-12 MED ORDER — HYDROCODONE-ACETAMINOPHEN 5-325 MG PO TABS: 1.0000 | ORAL_TABLET | ORAL | 0 refills | Status: DC | PRN

## 2018-06-12 MED ORDER — PROPOFOL 10 MG/ML IV BOLUS
INTRAVENOUS | Status: DC | PRN
  Administered 2018-06-12: 20 mg via INTRAVENOUS
  Administered 2018-06-12: 50 mg via INTRAVENOUS
  Administered 2018-06-12: 130 mg via INTRAVENOUS

## 2018-06-12 MED ORDER — LACTATED RINGERS IV SOLN
INTRAVENOUS | Status: DC
  Administered 2018-06-12: 11:00:00 via INTRAVENOUS

## 2018-06-12 MED ORDER — GABAPENTIN 300 MG PO CAPS
300.0000 mg | ORAL_CAPSULE | ORAL | Status: AC
  Administered 2018-06-12: 300 mg via ORAL

## 2018-06-12 MED ORDER — ONDANSETRON HCL 4 MG/2ML IJ SOLN
INTRAMUSCULAR | Status: DC | PRN
  Administered 2018-06-12: 4 mg via INTRAVENOUS

## 2018-06-12 MED ORDER — BUPIVACAINE HCL (PF) 0.5 % IJ SOLN
INTRAMUSCULAR | Status: AC
  Filled 2018-06-12: qty 30

## 2018-06-12 MED ORDER — METHYLENE BLUE 0.5 % INJ SOLN
INTRAVENOUS | Status: DC | PRN
  Administered 2018-06-12: 2 mL via SUBMUCOSAL

## 2018-06-12 MED ORDER — BUPIVACAINE-EPINEPHRINE (PF) 0.5% -1:200000 IJ SOLN
INTRAMUSCULAR | Status: DC | PRN
  Administered 2018-06-12: 30 mL via PERINEURAL

## 2018-06-12 MED ORDER — EPHEDRINE SULFATE 50 MG/ML IJ SOLN
INTRAMUSCULAR | Status: DC | PRN
  Administered 2018-06-12 (×2): 5 mg via INTRAVENOUS

## 2018-06-12 MED ORDER — ONDANSETRON HCL 4 MG/2ML IJ SOLN
INTRAMUSCULAR | Status: AC
  Filled 2018-06-12: qty 2

## 2018-06-12 MED ORDER — ACETAMINOPHEN 10 MG/ML IV SOLN
INTRAVENOUS | Status: DC | PRN
  Administered 2018-06-12: 1000 mg via INTRAVENOUS

## 2018-06-12 MED ORDER — DEXAMETHASONE SODIUM PHOSPHATE 10 MG/ML IJ SOLN
INTRAMUSCULAR | Status: DC | PRN
  Administered 2018-06-12: 4 mg via INTRAVENOUS

## 2018-06-12 MED ORDER — EPINEPHRINE PF 1 MG/ML IJ SOLN
INTRAMUSCULAR | Status: AC
  Filled 2018-06-12: qty 1

## 2018-06-12 MED ORDER — GABAPENTIN 300 MG PO CAPS
ORAL_CAPSULE | ORAL | Status: AC
  Administered 2018-06-12: 300 mg via ORAL
  Filled 2018-06-12: qty 1

## 2018-06-12 MED ORDER — SUCCINYLCHOLINE CHLORIDE 20 MG/ML IJ SOLN
INTRAMUSCULAR | Status: AC
  Filled 2018-06-12: qty 1

## 2018-06-12 MED ORDER — TECHNETIUM TC 99M SULFUR COLLOID FILTERED
1.0000 | Freq: Once | INTRAVENOUS | Status: AC | PRN
  Administered 2018-06-12: 0.772 via INTRADERMAL

## 2018-06-12 MED ORDER — LIDOCAINE HCL (CARDIAC) PF 100 MG/5ML IV SOSY
PREFILLED_SYRINGE | INTRAVENOUS | Status: DC | PRN
  Administered 2018-06-12: 100 mg via INTRAVENOUS

## 2018-06-12 MED ORDER — DEXMEDETOMIDINE HCL 200 MCG/2ML IV SOLN
INTRAVENOUS | Status: DC | PRN
  Administered 2018-06-12 (×4): 4 ug via INTRAVENOUS

## 2018-06-12 MED ORDER — FENTANYL CITRATE (PF) 100 MCG/2ML IJ SOLN
INTRAMUSCULAR | Status: DC | PRN
  Administered 2018-06-12 (×2): 50 ug via INTRAVENOUS
  Administered 2018-06-12: 25 ug via INTRAVENOUS
  Administered 2018-06-12: 50 ug via INTRAVENOUS
  Administered 2018-06-12: 25 ug via INTRAVENOUS

## 2018-06-12 MED ORDER — METHYLENE BLUE 0.5 % INJ SOLN
INTRAVENOUS | Status: AC
  Filled 2018-06-12: qty 10

## 2018-06-12 SURGICAL SUPPLY — 54 items
BINDER BREAST LRG (GAUZE/BANDAGES/DRESSINGS) ×2 IMPLANT
BINDER BREAST MEDIUM (GAUZE/BANDAGES/DRESSINGS) IMPLANT
BINDER BREAST XLRG (GAUZE/BANDAGES/DRESSINGS) IMPLANT
BINDER BREAST XXLRG (GAUZE/BANDAGES/DRESSINGS) IMPLANT
BLADE SURG 15 STRL SS SAFETY (BLADE) ×4 IMPLANT
BULB RESERV EVAC DRAIN JP 100C (MISCELLANEOUS) IMPLANT
CANISTER SUCT 1200ML W/VALVE (MISCELLANEOUS) ×2 IMPLANT
CHLORAPREP W/TINT 26ML (MISCELLANEOUS) ×2 IMPLANT
CNTNR SPEC 2.5X3XGRAD LEK (MISCELLANEOUS)
CONT SPEC 4OZ STER OR WHT (MISCELLANEOUS)
CONTAINER SPEC 2.5X3XGRAD LEK (MISCELLANEOUS) IMPLANT
COVER PROBE FLX POLY STRL (MISCELLANEOUS) ×2 IMPLANT
COVER WAND RF STERILE (DRAPES) IMPLANT
DEVICE DUBIN SPECIMEN MAMMOGRA (MISCELLANEOUS) ×2 IMPLANT
DRAIN CHANNEL JP 15F RND 16 (MISCELLANEOUS) IMPLANT
DRAPE LAPAROTOMY TRNSV 106X77 (MISCELLANEOUS) ×2 IMPLANT
DRSG GAUZE FLUFF 36X18 (GAUZE/BANDAGES/DRESSINGS) ×4 IMPLANT
DRSG TELFA 3X8 NADH (GAUZE/BANDAGES/DRESSINGS) ×2 IMPLANT
ELECT CAUTERY BLADE TIP 2.5 (TIP) ×2
ELECT REM PT RETURN 9FT ADLT (ELECTROSURGICAL) ×2
ELECTRODE CAUTERY BLDE TIP 2.5 (TIP) ×1 IMPLANT
ELECTRODE REM PT RTRN 9FT ADLT (ELECTROSURGICAL) ×1 IMPLANT
GAUZE SPONGE 4X4 12PLY STRL (GAUZE/BANDAGES/DRESSINGS) IMPLANT
GLOVE BIO SURGEON STRL SZ7.5 (GLOVE) ×2 IMPLANT
GLOVE INDICATOR 8.0 STRL GRN (GLOVE) ×2 IMPLANT
GOWN STRL REUS W/ TWL LRG LVL3 (GOWN DISPOSABLE) ×2 IMPLANT
GOWN STRL REUS W/TWL LRG LVL3 (GOWN DISPOSABLE) ×2
KIT TURNOVER KIT A (KITS) ×2 IMPLANT
LABEL OR SOLS (LABEL) ×2 IMPLANT
MARGIN MAP 10MM (MISCELLANEOUS) ×2 IMPLANT
NEEDLE HYPO 22GX1.5 SAFETY (NEEDLE) ×2 IMPLANT
NEEDLE HYPO 25X1 1.5 SAFETY (NEEDLE) ×4 IMPLANT
PACK BASIN MINOR ARMC (MISCELLANEOUS) ×2 IMPLANT
RETRACTOR RING XSMALL (MISCELLANEOUS) ×1 IMPLANT
RTRCTR WOUND ALEXIS 13CM XS SH (MISCELLANEOUS) ×2
SHEARS FOC LG CVD HARMONIC 17C (MISCELLANEOUS) IMPLANT
SHEARS HARMONIC 9CM CVD (BLADE) IMPLANT
SLEVE PROBE SENORX GAMMA FIND (MISCELLANEOUS) ×2 IMPLANT
STRIP CLOSURE SKIN 1/2X4 (GAUZE/BANDAGES/DRESSINGS) ×2 IMPLANT
SUT ETHILON 3-0 FS-10 30 BLK (SUTURE) ×2
SUT SILK 2 0 (SUTURE) ×1
SUT SILK 2-0 18XBRD TIE 12 (SUTURE) ×1 IMPLANT
SUT VIC AB 2-0 CT1 27 (SUTURE) ×2
SUT VIC AB 2-0 CT1 TAPERPNT 27 (SUTURE) ×2 IMPLANT
SUT VIC AB 3-0 SH 27 (SUTURE) ×1
SUT VIC AB 3-0 SH 27X BRD (SUTURE) ×1 IMPLANT
SUT VIC AB 4-0 FS2 27 (SUTURE) ×2 IMPLANT
SUT VICRYL+ 3-0 144IN (SUTURE) ×2 IMPLANT
SUTURE EHLN 3-0 FS-10 30 BLK (SUTURE) ×1 IMPLANT
SWABSTK COMLB BENZOIN TINCTURE (MISCELLANEOUS) ×2 IMPLANT
SYR 10ML LL (SYRINGE) ×2 IMPLANT
SYR BULB IRRIG 60ML STRL (SYRINGE) ×2 IMPLANT
TAPE TRANSPORE STRL 2 31045 (GAUZE/BANDAGES/DRESSINGS) ×2 IMPLANT
WATER STERILE IRR 1000ML POUR (IV SOLUTION) ×2 IMPLANT

## 2018-06-12 NOTE — Transfer of Care (Signed)
Immediate Anesthesia Transfer of Care Note  Patient: Theresa Reid  Procedure(s) Performed: RIGHT BREAST WIDE EXCISION WITH SENTINEL LYMPH NODE BX (Right Breast)  Patient Location: PACU  Anesthesia Type:General  Level of Consciousness: drowsy  Airway & Oxygen Therapy: Patient connected to face mask oxygen  Post-op Assessment: Post -op Vital signs reviewed and stable  Post vital signs: stable  Last Vitals:  Vitals Value Taken Time  BP 115/60 06/12/2018  2:32 PM  Temp 36.5 C 06/12/2018  2:32 PM  Pulse 92 06/12/2018  2:32 PM  Resp 18 06/12/2018  2:32 PM  SpO2 92 % 06/12/2018  2:32 PM    Last Pain:  Vitals:   06/12/18 1432  TempSrc: Temporal  PainSc:          Complications: No apparent anesthesia complications

## 2018-06-12 NOTE — H&P (Signed)
No change in clinical history or exam. For right wide excision, SLN biopsy.  

## 2018-06-12 NOTE — Anesthesia Preprocedure Evaluation (Signed)
Anesthesia Evaluation  Patient identified by MRN, date of birth, ID band Patient awake    Reviewed: Allergy & Precautions, NPO status , Patient's Chart, lab work & pertinent test results  History of Anesthesia Complications Negative for: history of anesthetic complications  Airway Mallampati: II  TM Distance: >3 FB Neck ROM: Full    Dental no notable dental hx.    Pulmonary neg pulmonary ROS, neg sleep apnea, neg COPD,    breath sounds clear to auscultation- rhonchi (-) wheezing      Cardiovascular hypertension, Pt. on medications (-) CAD, (-) Past MI, (-) Cardiac Stents and (-) CABG  Rhythm:Regular Rate:Normal - Systolic murmurs and - Diastolic murmurs    Neuro/Psych neg Seizures negative neurological ROS  negative psych ROS   GI/Hepatic Neg liver ROS, GERD  ,  Endo/Other  negative endocrine ROSneg diabetes  Renal/GU negative Renal ROS     Musculoskeletal  (+) Arthritis ,   Abdominal (+) + obese,   Peds  Hematology negative hematology ROS (+)   Anesthesia Other Findings Past Medical History: No date: Arthritis No date: Diverticulitis No date: Family history of breast cancer No date: GERD (gastroesophageal reflux disease) No date: Hyperlipidemia No date: Hypertension   Reproductive/Obstetrics                             Anesthesia Physical Anesthesia Plan  ASA: III  Anesthesia Plan: General   Post-op Pain Management:    Induction: Intravenous  PONV Risk Score and Plan: 2 and Ondansetron, Dexamethasone and Midazolam  Airway Management Planned: LMA  Additional Equipment:   Intra-op Plan:   Post-operative Plan:   Informed Consent: I have reviewed the patients History and Physical, chart, labs and discussed the procedure including the risks, benefits and alternatives for the proposed anesthesia with the patient or authorized representative who has indicated his/her  understanding and acceptance.     Dental advisory given  Plan Discussed with: CRNA and Anesthesiologist  Anesthesia Plan Comments:         Anesthesia Quick Evaluation

## 2018-06-12 NOTE — Anesthesia Post-op Follow-up Note (Signed)
Anesthesia QCDR form completed.        

## 2018-06-12 NOTE — Anesthesia Procedure Notes (Signed)
Procedure Name: LMA Insertion Date/Time: 06/12/2018 1:18 PM Performed by: Launa Grill, RN Pre-anesthesia Checklist: Patient identified, Patient being monitored, Timeout performed, Emergency Drugs available and Suction available Patient Re-evaluated:Patient Re-evaluated prior to induction Oxygen Delivery Method: Circle system utilized Preoxygenation: Pre-oxygenation with 100% oxygen Induction Type: IV induction Ventilation: Mask ventilation without difficulty LMA: LMA inserted LMA Size: 4.0 Tube type: Oral Number of attempts: 1 Placement Confirmation: positive ETCO2 and breath sounds checked- equal and bilateral Tube secured with: Tape Dental Injury: Teeth and Oropharynx as per pre-operative assessment

## 2018-06-12 NOTE — Discharge Instructions (Signed)
AMBULATORY SURGERY  °DISCHARGE INSTRUCTIONS ° ° °1) The drugs that you were given will stay in your system until tomorrow so for the next 24 hours you should not: ° °A) Drive an automobile °B) Make any legal decisions °C) Drink any alcoholic beverage ° ° °2) You may resume regular meals tomorrow.  Today it is better to start with liquids and gradually work up to solid foods. ° °You may eat anything you prefer, but it is better to start with liquids, then soup and crackers, and gradually work up to solid foods. ° ° °3) Please notify your doctor immediately if you have any unusual bleeding, trouble breathing, redness and pain at the surgery site, drainage, fever, or pain not relieved by medication. ° ° ° °4) Additional Instructions: ° ° ° ° ° ° ° °Please contact your physician with any problems or Same Day Surgery at [phone], Monday through Friday 6 am to 4 pm, or [place] at [place] Main number at [phone]. °

## 2018-06-12 NOTE — Op Note (Addendum)
Preoperative diagnosis: Right breast cancer.  Postoperative diagnosis: Same.  Operative procedure: Wide excision of the right upper outer quadrant breast cancer with ultrasound guidance, sentinel node biopsy.  Operating Surgeon: Hervey Ard, MD.  Anesthesia: General by LMA, Marcaine 0.5% with 1 to 200,000 units of epinephrine, 30 cc.  Estimated blood loss: 10 cc.  Clinical note: This 78 year old woman was identified with a new nodular density in the upper outer quadrant of the right breast.  Core biopsy showed invasive mammary carcinoma.  She desired breast conservation.  She underwent injection with technetium sulfur colloid the morning of the procedure.  Operative note: The patient underwent general anesthesia and tolerated this well.  5 cc of 0.5% methylene blue was injected in the subareolar plexus after skin prepped with alcohol.  The breast was taped to the lef  The breast, chest and axilla was cleansed with ChloraPrep and draped.  Ultrasound was used to identify the lesion in the 11 o'clock position of the breast about 8 cm from the nipple.  A curvilinear incision was made after instillation of local anesthetic.  The skin was incised sharply and remaining dissection completed with electrocautery.  A extra small Alexis wound protector was placed in a 3 x 4 x 4 cm block of tissue was excised.  This showed the tumor nodule near the medial edge.  Specimen radiograph confirmed the previously placed clip.  A 5 mm wide sheet of tissue was extracted from the medial wall of the cavity and late orientated as the new medial border.  The new medial border was adjacent to the Telfa pad.  This was sent in formalin for routine histology.  While the breast specimen was being processed attention was turned to the axilla.  The node seeker device was used to identify an area of increased uptake in the lower aspect of the axilla.  Local anesthetic was infiltrated.  A transverse incision was made carried down  through skin subtendinous tissue.  A single blue hot lymph node with multiple lymphatic channels draining into it was identified.  This was extracted.  Counts were 2500.  Scanning through the axilla showed no additional nodes.  No other nodes by palpation.  The node was sent in formalin for routine histology.  The axillary wound was closed in layers with interrupted 2-0 Vicryl sutures.  The skin was closed with a running 4-0 Vicryl subcuticular suture.  The breast wound was closed in a similar fashion.  Benzoin, Steri-Strips followed by Telfa, fluff gauze and a compressive wrap were applied.  The patient tolerated the procedure well and was taken to the recovery room in stable condition.

## 2018-06-13 ENCOUNTER — Encounter: Payer: Self-pay | Admitting: General Surgery

## 2018-06-13 ENCOUNTER — Other Ambulatory Visit: Payer: Self-pay | Admitting: Genetic Counselor

## 2018-06-13 ENCOUNTER — Other Ambulatory Visit: Payer: Self-pay | Admitting: *Deleted

## 2018-06-13 DIAGNOSIS — Z7189 Other specified counseling: Secondary | ICD-10-CM

## 2018-06-13 DIAGNOSIS — C50911 Malignant neoplasm of unspecified site of right female breast: Secondary | ICD-10-CM

## 2018-06-13 NOTE — Anesthesia Postprocedure Evaluation (Signed)
Anesthesia Post Note  Patient: Theresa Reid  Procedure(s) Performed: RIGHT BREAST WIDE EXCISION WITH SENTINEL LYMPH NODE BX (Right Breast)  Patient location during evaluation: PACU Anesthesia Type: General Level of consciousness: awake and alert and oriented Pain management: pain level controlled Vital Signs Assessment: post-procedure vital signs reviewed and stable Respiratory status: spontaneous breathing, nonlabored ventilation and respiratory function stable Cardiovascular status: blood pressure returned to baseline and stable Postop Assessment: no signs of nausea or vomiting Anesthetic complications: no     Last Vitals:  Vitals:   06/12/18 1546 06/12/18 1623  BP: (!) 146/64 (!) 129/47  Pulse: 88 85  Resp: 18 18  Temp: (!) 36.3 C   SpO2: 94% 95%    Last Pain:  Vitals:   06/12/18 1623  TempSrc:   PainSc: 0-No pain                 Enas Winchel

## 2018-06-13 NOTE — Telephone Encounter (Signed)
Theresa Reid was notified by a rep at [facility] that the cost of testing will be $5. I will place the order for a blood draw and let Judeen Hammans know. She can get this done when she sees Dr. Janese Banks on 06/27/18.

## 2018-06-14 ENCOUNTER — Other Ambulatory Visit: Payer: Self-pay | Admitting: Anatomic Pathology & Clinical Pathology

## 2018-06-14 LAB — SURGICAL PATHOLOGY

## 2018-06-17 ENCOUNTER — Telehealth: Payer: Self-pay | Admitting: General Surgery

## 2018-06-17 NOTE — Telephone Encounter (Signed)
The patient was notified that the biopsy results showed the tumor was larger than expected, closer to 24 mm rather than 4.  The sentinel node had a positive macro metastatic foci.   She is coming in with her daughter to discuss options for additional therapy.  We will touch base with Dr. Janese Banks about Oncotype or MammaPrint in light of the positive node.

## 2018-06-18 ENCOUNTER — Other Ambulatory Visit: Payer: Self-pay

## 2018-06-18 ENCOUNTER — Telehealth: Payer: Self-pay | Admitting: *Deleted

## 2018-06-18 ENCOUNTER — Encounter: Payer: Self-pay | Admitting: General Surgery

## 2018-06-18 ENCOUNTER — Ambulatory Visit (INDEPENDENT_AMBULATORY_CARE_PROVIDER_SITE_OTHER): Payer: Medicare Other | Admitting: General Surgery

## 2018-06-18 VITALS — BP 141/80 | HR 91 | Temp 97.2°F | Resp 18 | Ht 64.0 in | Wt 195.2 lb

## 2018-06-18 DIAGNOSIS — Z170 Estrogen receptor positive status [ER+]: Secondary | ICD-10-CM

## 2018-06-18 DIAGNOSIS — C50411 Malignant neoplasm of upper-outer quadrant of right female breast: Secondary | ICD-10-CM

## 2018-06-18 NOTE — Telephone Encounter (Signed)
-----   Message from Robert Bellow, MD sent at 06/17/2018  2:32 PM EST ----- Please have tissue sent out for Mammoprint testing. Thanks.

## 2018-06-18 NOTE — Telephone Encounter (Signed)
Forms completed and information faxed to [facility] as requested.

## 2018-06-18 NOTE — Progress Notes (Signed)
Patient ID: Theresa Reid, female   DOB: [birth year], 78 y.o.   MRN: [id number]  Chief Complaint  Patient presents with  . Routine Post Op     6 day post right breast lumpectomy    HPI Theresa Reid is a 78 y.o. female here today to follow up for 6 day post op right breast lumpectomy. Patient states she is feeling well. She is here today with her daughter [name].  HPI  Past Medical History:  Diagnosis Date  . Arthritis   . Diverticulitis   . Family history of breast cancer   . GERD (gastroesophageal reflux disease)   . Hyperlipidemia   . Hypertension     Past Surgical History:  Procedure Laterality Date  . BREAST BIOPSY Right 05/28/2018   [place] bx, INVASIVE MAMMARY CARCINOMA WITH LOBULAR FEATURES  . BREAST LUMPECTOMY Right 06/12/2018   [facility], lobular features  . BREAST LUMPECTOMY WITH SENTINEL LYMPH NODE BIOPSY Right 06/12/2018   Procedure: RIGHT BREAST WIDE EXCISION WITH SENTINEL LYMPH NODE BX;  Surgeon: Robert Bellow, MD;  Location: [facility] ORS;  Service: General;  Laterality: Right;  . CHOLECYSTECTOMY    . [place]  . JOINT REPLACEMENT    . ovaraian cyst removal Right    Cyst only (NOT OVARY)  . TOTAL KNEE ARTHROPLASTY Bilateral 05/05/2010    Family History  Problem Relation Age of Onset  . Alcohol abuse Mother        deceased 62  . Heart disease Father        deceased 61  . Diabetes Father   . Breast cancer Maternal Aunt        dx 90s; deceased 26s  . Breast cancer Maternal Aunt        dx 50s; deceased 27s    Social History Social History   Tobacco Use  . Smoking status: Never Smoker  . Smokeless tobacco: Never Used  Substance Use Topics  . Alcohol use: No    Alcohol/week: 0.0 standard drinks  . Drug use: No    No Known Allergies  Current Outpatient Medications  Medication Sig Dispense Refill  . amLODipine (NORVASC) 5 MG tablet TAKE 1 TABLET BY MOUTH  DAILY (Patient taking differently: Take 5 mg by mouth daily. ) 90 tablet 1  .  atorvastatin (LIPITOR) 80 MG tablet TAKE 1 TABLET BY MOUTH  DAILY (Patient taking differently: Take 80 mg by mouth every evening. ) 90 tablet 1  . CALCIUM-VITAMIN D PO Take 1 tablet by mouth daily.     Marland Kitchen CRANBERRY PO Take 1 tablet by mouth 2 (two) times daily.     . Multiple Vitamins tablet Take 1 tablet by mouth daily.     . Omega-3 Fatty Acids (FISH OIL PO) Take 1 capsule by mouth daily.     Marland Kitchen oxybutynin (DITROPAN) 5 MG tablet TAKE 1 TABLET BY MOUTH TWO  TIMES DAILY (Patient taking differently: Take 5 mg by mouth 2 (two) times daily. ) 180 tablet 1  . pantoprazole (PROTONIX) 40 MG tablet TAKE 1 TABLET BY MOUTH  DAILY (Patient taking differently: Take 40 mg by mouth daily. ) 90 tablet 1  . potassium chloride (K-DUR,KLOR-CON) 10 MEQ tablet TAKE 1 TABLET BY MOUTH  DAILY (Patient taking differently: Take 10 mEq by mouth daily. ) 90 tablet 1  . torsemide (DEMADEX) 5 MG tablet TAKE 1 TABLET BY MOUTH  DAILY (Patient taking differently: Take 5 mg by mouth daily. ) 90 tablet 1  .  Turmeric 500 MG CAPS Take 1,000 mg by mouth daily.     Marland Kitchen HYDROcodone-acetaminophen (NORCO/VICODIN) 5-325 MG tablet Take 1 tablet by mouth every 4 (four) hours as needed for moderate pain. (Patient not taking: Reported on 06/18/2018) 15 tablet 0   No current facility-administered medications for this visit.     Review of Systems Review of Systems  Constitutional: Negative.   Respiratory: Negative.   Cardiovascular: Negative.     Blood pressure (!) 141/80, pulse 91, temperature (!) 97.2 F (36.2 C), temperature source Temporal, resp. rate 18, height 5\' 4"  (1.626 m), weight 195 lb 3.2 oz (88.5 kg), SpO2 96 %.  Physical Exam Physical Exam Constitutional:      Appearance: She is well-developed.  Eyes:     General: No scleral icterus.    Conjunctiva/sclera: Conjunctivae normal.  Neck:     Musculoskeletal: Normal range of motion.  Cardiovascular:     Rate and Rhythm: Normal rate and regular rhythm.     Heart sounds:  Normal heart sounds.  Pulmonary:     Effort: Pulmonary effort is normal.     Breath sounds: Normal breath sounds.  Chest:     Breasts: Breasts are symmetrical.        Right: No inverted nipple, mass, nipple discharge, skin change or tenderness.        Left: No inverted nipple, mass, nipple discharge, skin change or tenderness.    Lymphadenopathy:     Cervical: No cervical adenopathy.  Skin:    General: Skin is warm and dry.  Neurological:     Mental Status: She is alert and oriented to person, place, and time.     Data Reviewed Surgical Pathology  CASE: [id number]  PATIENT: Theresa Reid  Surgical Pathology Report      SPECIMEN SUBMITTED:  A. Breast, right upper quadrant wide excision  B. Breast, right, new medial margins, border is on Telfa  C. Lymph node, right sentinel   CLINICAL HISTORY:  None provided   PRE-OPERATIVE DIAGNOSIS:  Right breast cancer   POST-OPERATIVE DIAGNOSIS:  None provided.      DIAGNOSIS:  A. BREAST, RIGHT UPPER QUADRANT; WIDE EXCISION:  - INVASIVE LOBULAR CARCINOMA.  - CLIP AND BIOPSY SITE IDENTIFIED.  - Tumor Size: 24 mm (16 mm in original specimen + 8 mm from medial  re-excision)   B. BREAST, RIGHT UPPER QUADRANT, NEW MEDIAL MARGIN; EXCISION:  - INVASIVE LOBULAR CARCINOMA, FOCALLY PRESENT AT NEW MEDIAL MARGIN.   C. LYMPH NODE, RIGHT SENTINEL; EXCISION:  - ONE LYMPH NODE, INVOLVED BY MACRO-METASTATIC CARCINOMA (1/1).   Preoperative ultrasound dated May 22, 2018 Targeted ultrasound is performed, showing right breast 11 o'clock 6 cm from the nipple hypoechoic irregular shadowing mass which measures 0.9 x 1.0 x 1.1 cm. This finding corresponds to the mammographically seen irregular calcifications containing mass.  Assessment Doing well post wide excision with positive margins as well as macro metastatic disease in the axilla.  Plan MammaPrint testing has been requested after discussion with medical  oncology.  Indications for reexcision and negative margins were reviewed with the patient and her daughter.  Potential for axillary dissection based on metastatic disease (macro metastatic) needs to be considered.  Follow-up with medical oncology will be postponed for 1 week pending retrieval of MammoSite information.  Will review surgical options after that information is available.   HPI, Physical Exam, Assessment and Plan have been scribed under the direction and in the presence of Hervey Ard, Md.  Eudelia Bunch R. Bobette Mo, CMA  I have completed the exam and reviewed the above documentation for accuracy and completeness.  I agree with the above.  Haematologist has been used and any errors in dictation or transcription are unintentional.  Hervey Ard, M.D., F.A.C.S.  Forest Gleason Teneisha Gignac 06/19/2018, 1:58 PM

## 2018-06-18 NOTE — Patient Instructions (Addendum)
Patient will need to return to the office as needed .Use heating pad to help with swelling and bruising of the skin. Wear bra for support.  Call the office with any questions or concerns.

## 2018-06-19 DIAGNOSIS — C50411 Malignant neoplasm of upper-outer quadrant of right female breast: Secondary | ICD-10-CM | POA: Diagnosis not present

## 2018-06-24 ENCOUNTER — Encounter: Payer: Self-pay | Admitting: *Deleted

## 2018-06-24 NOTE — Progress Notes (Signed)
  Oncology Nurse Navigator Documentation  Navigator Location: [facility] (06/24/18 1600)   )Navigator Encounter Type: Telephone (06/24/18 1600) Telephone: Incoming Call (06/24/18 1600)                           Interventions: Coordination of Care (06/24/18 1600)                      Time Spent with Patient: 15 (06/24/18 1600)   Patient called with questions regarding her appointments.  States she was told she would not need to see Dr. Janese Banks this week, because Dr. Bary Castilla had sent off a test.  I did discuss with Dr. Janese Banks.  Appointments have been rescheduled to see her and Dr. Baruch Gouty on 07/04/18 @ 1:30 and 2:00.  This should allow time for the mammoprint test results to come back.  Patient of aware of appointment change.

## 2018-06-26 ENCOUNTER — Encounter: Payer: Self-pay | Admitting: General Surgery

## 2018-06-27 ENCOUNTER — Ambulatory Visit: Payer: Medicare Other | Admitting: Radiation Oncology

## 2018-06-27 ENCOUNTER — Inpatient Hospital Stay: Payer: Medicare Other | Admitting: Oncology

## 2018-06-27 ENCOUNTER — Inpatient Hospital Stay: Payer: Medicare Other

## 2018-07-02 ENCOUNTER — Encounter: Payer: Self-pay | Admitting: General Surgery

## 2018-07-02 ENCOUNTER — Other Ambulatory Visit: Payer: Self-pay

## 2018-07-02 ENCOUNTER — Ambulatory Visit (INDEPENDENT_AMBULATORY_CARE_PROVIDER_SITE_OTHER): Payer: Medicare Other | Admitting: General Surgery

## 2018-07-02 VITALS — BP 144/82 | HR 76 | Temp 97.8°F | Ht 64.0 in | Wt 194.0 lb

## 2018-07-02 DIAGNOSIS — Z170 Estrogen receptor positive status [ER+]: Secondary | ICD-10-CM

## 2018-07-02 DIAGNOSIS — C50411 Malignant neoplasm of upper-outer quadrant of right female breast: Secondary | ICD-10-CM

## 2018-07-02 NOTE — Progress Notes (Signed)
Patient ID: Theresa Reid, female   DOB: 1940/07/09, 78 y.o.   MRN: [id number]  Chief Complaint  Patient presents with  . Follow-up    HPI Theresa Reid is a 78 y.o. female here today to discuss her recent breast surgery.  She is accompanied by her daughter, [name]. The patient reports she is having minimal discomfort post wide excision. HPI  Past Medical History:  Diagnosis Date  . Arthritis   . Diverticulitis   . Family history of breast cancer   . GERD (gastroesophageal reflux disease)   . Hyperlipidemia   . Hypertension     Past Surgical History:  Procedure Laterality Date  . BREAST BIOPSY Right 05/28/2018   [place] b[place], INVASIVE MAMMARY CARCINOMA WITH LOBULAR FEATURES  . BREAST LUMPECTOMY Right 06/12/2018   [facility], lobular features  . BREAST LUMPECTOMY WITH SENTINEL LYMPH NODE BIOPSY Right 06/12/2018   Procedure: RIGHT BREAST WIDE EXCISION WITH SENTINEL LYMPH NODE BX;  Surgeon: Robert Bellow, MD;  Location: [facility] ORS;  Service: General;  Laterality: Right;  . CHOLECYSTECTOMY    . [place]  . JOINT REPLACEMENT    . ovaraian cyst removal Right    Cyst only (NOT OVARY)  . TOTAL KNEE ARTHROPLASTY Bilateral 05/05/2010    Family History  Problem Relation Age of Onset  . Alcohol abuse Mother        deceased 44  . Heart disease Father        deceased 58  . Diabetes Father   . Breast cancer Maternal Aunt        dx 40s; deceased 44s  . Breast cancer Maternal Aunt        dx 32s; deceased 44s    Social History Social History   Tobacco Use  . Smoking status: Never Smoker  . Smokeless tobacco: Never Used  Substance Use Topics  . Alcohol use: No    Alcohol/week: 0.0 standard drinks  . Drug use: No    No Known Allergies  Current Outpatient Medications  Medication Sig Dispense Refill  . amLODipine (NORVASC) 5 MG tablet TAKE 1 TABLET BY MOUTH  DAILY (Patient taking differently: Take 5 mg by mouth daily. ) 90 tablet 1  . atorvastatin  (LIPITOR) 80 MG tablet TAKE 1 TABLET BY MOUTH  DAILY (Patient taking differently: Take 80 mg by mouth every evening. ) 90 tablet 1  . CALCIUM-VITAMIN D PO Take 1 tablet by mouth daily.     Marland Kitchen CRANBERRY PO Take 1 tablet by mouth 2 (two) times daily.     Marland Kitchen HYDROcodone-acetaminophen (NORCO/VICODIN) 5-325 MG tablet Take 1 tablet by mouth every 4 (four) hours as needed for moderate pain. 15 tablet 0  . Multiple Vitamins tablet Take 1 tablet by mouth daily.     . Omega-3 Fatty Acids (FISH OIL PO) Take 1 capsule by mouth daily.     Marland Kitchen oxybutynin (DITROPAN) 5 MG tablet TAKE 1 TABLET BY MOUTH TWO  TIMES DAILY (Patient taking differently: Take 5 mg by mouth 2 (two) times daily. ) 180 tablet 1  . pantoprazole (PROTONIX) 40 MG tablet TAKE 1 TABLET BY MOUTH  DAILY (Patient taking differently: Take 40 mg by mouth daily. ) 90 tablet 1  . potassium chloride (K-DUR,KLOR-CON) 10 MEQ tablet TAKE 1 TABLET BY MOUTH  DAILY (Patient taking differently: Take 10 mEq by mouth daily. ) 90 tablet 1  . torsemide (DEMADEX) 5 MG tablet TAKE 1 TABLET BY MOUTH  DAILY (Patient taking differently:  Take 5 mg by mouth daily. ) 90 tablet 1  . Turmeric 500 MG CAPS Take 1,000 mg by mouth daily.      No current facility-administered medications for this visit.     Review of Systems Review of Systems  Blood pressure (!) 144/82, pulse 76, temperature 97.8 F (36.6 C), temperature source Skin, height 5\' 4"  (1.626 m), weight 194 lb (88 kg), SpO2 97 %.  Physical Exam Physical Exam Deferred  Data Reviewed Original mammogram of May 22, 2018 described a 0.8 cm nodule.  Ultrasound showed a lesion with a maximum diameter of 1.1 cm. Pathology specimen of June 12, 2018 showed a minimum diameter of 24 mm, with a positive medial margin.   Assessment Invasive mammary carcinoma with positive margin, macro metastatic axillary disease.  Plan If the patient will be having adjuvant chemotherapy and radiation at this time there is not a  indication for completion axillary dissection based on query of the [place] online community.  Options for management at this time include reexcision of the breast, recognizing the potential that negative margins might not be achieved.  This would be followed by adjuvant chemotherapy (to be discussed on July 04, 2018 with Dr. Janese Banks, followed by whole breast and axillary radiation.  If the patient decided that breast preservation was not a first-line consideration, she could be considered a candidate for mastectomy and axillary dissection which would obviate the need for post chemotherapy breast radiation.  Originally, when the case was presented at the [facility] breast tumor conference MRI was not felt mandatory due to the fatty replaced breast and the well-defined nodule.  Based on the [facility] breast tumor conference of July 01, 2018 should the patient decide to proceed with reexcision, MRI was recommended and will be scheduled.  The patient's homework assignment at this time is to consider how important breast preservation is and if she were to be explored and negative margins not achieved with this be a catastrophic event for her.  Should she choose to go ahead with adjuvant chemotherapy, the role of PowerPort placement was discussed and this would be placed at the time of her additional breast surgery.    Forest Gleason Anthony Roland 07/03/2018, 11:01 AM

## 2018-07-03 ENCOUNTER — Other Ambulatory Visit: Payer: Self-pay

## 2018-07-03 DIAGNOSIS — Z1231 Encounter for screening mammogram for malignant neoplasm of breast: Secondary | ICD-10-CM

## 2018-07-04 ENCOUNTER — Other Ambulatory Visit: Payer: Medicare Other

## 2018-07-04 ENCOUNTER — Inpatient Hospital Stay: Payer: Medicare Other

## 2018-07-04 ENCOUNTER — Institutional Professional Consult (permissible substitution): Payer: Medicare Other | Admitting: Radiation Oncology

## 2018-07-04 ENCOUNTER — Other Ambulatory Visit: Payer: Self-pay

## 2018-07-04 ENCOUNTER — Inpatient Hospital Stay: Payer: Medicare Other | Attending: Oncology | Admitting: Oncology

## 2018-07-04 ENCOUNTER — Encounter: Payer: Self-pay | Admitting: Oncology

## 2018-07-04 VITALS — BP 143/80 | HR 82 | Temp 98.1°F | Resp 18 | Wt 195.0 lb

## 2018-07-04 DIAGNOSIS — I10 Essential (primary) hypertension: Secondary | ICD-10-CM | POA: Insufficient documentation

## 2018-07-04 DIAGNOSIS — Z170 Estrogen receptor positive status [ER+]: Secondary | ICD-10-CM | POA: Diagnosis not present

## 2018-07-04 DIAGNOSIS — Z7189 Other specified counseling: Secondary | ICD-10-CM

## 2018-07-04 DIAGNOSIS — C50911 Malignant neoplasm of unspecified site of right female breast: Secondary | ICD-10-CM

## 2018-07-04 DIAGNOSIS — C50411 Malignant neoplasm of upper-outer quadrant of right female breast: Secondary | ICD-10-CM | POA: Diagnosis not present

## 2018-07-04 DIAGNOSIS — C50919 Malignant neoplasm of unspecified site of unspecified female breast: Secondary | ICD-10-CM | POA: Diagnosis not present

## 2018-07-04 DIAGNOSIS — Z79899 Other long term (current) drug therapy: Secondary | ICD-10-CM | POA: Diagnosis not present

## 2018-07-04 DIAGNOSIS — Z803 Family history of malignant neoplasm of breast: Secondary | ICD-10-CM | POA: Diagnosis not present

## 2018-07-04 NOTE — Progress Notes (Signed)
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  She recently underwent a screening mammogram on 05/22/2018 which showed Suspicious mass in the 11 o'clock position of the right breast about 1 cm in size.  Normal appearing axilla.  She underwent a core biopsy which showed invasive mammary carcinoma with lobular features.  4 mm, grade 1, ER greater than 90% positive, PR greater than 90% positive and HER-2/neu negative.  She had menarche at the age of 56.  She has 2 children.  Menopause around 23 years of age.  She used birth control briefly in the 106s.  No hormone replacement therapy.  Family history significant for maternal aunts 2 of them who had breast cancer and her great grandmother who had breast cancer.   Patient underwent lumpectomy and sentinel lymph node biopsy on 06/12/2018.  Pathology showed invasive lobular carcinoma with positive medial margin which was reexcised and still positive.  Tumor size was 24 mm, grade 2 ER PR positive and HER-2/neu negative.  One sentinel lymph node was excised and was positive for macro metastases 8 mm.  No extranodal extension present.  Lymphovascular extension was present.  MammaPrint was sent off which came back as high risk with an average 10-year risk of untreated disease at 29%  Interval history-she is recovering well from her lumpectomy denies any complaints at this time.  ECOG PS- 1 Pain scale- 0   Review of systems-  Review of Systems  Constitutional: Negative for chills, fever, malaise/fatigue and weight loss.  HENT: Negative for congestion, ear discharge and nosebleeds.   Eyes: Negative for blurred vision.  Respiratory: Negative for cough, hemoptysis, sputum production, shortness of breath and wheezing.   Cardiovascular: Negative for chest pain, palpitations, orthopnea and claudication.  Gastrointestinal: Negative for abdominal pain, blood in stool, constipation, diarrhea, heartburn, melena, nausea and vomiting.  Genitourinary: Negative for dysuria, flank pain, frequency, hematuria and urgency.  Musculoskeletal: Negative for back pain, joint pain and myalgias.  Skin: Negative for rash.  Neurological: Negative for dizziness, tingling, focal weakness, seizures, weakness and headaches.  Endo/Heme/Allergies: Does not bruise/bleed easily.  Psychiatric/Behavioral: Negative for depression and suicidal ideas. The patient does not have insomnia.       No Known Allergies   Past Medical History:  Diagnosis Date  . Arthritis   . Diverticulitis   . Family history of breast cancer   . GERD (gastroesophageal reflux disease)   . Hyperlipidemia   . Hypertension      Past Surgical History:  Procedure Laterality Date  . BREAST BIOPSY Right 05/28/2018   [place] bx, INVASIVE MAMMARY CARCINOMA WITH LOBULAR FEATURES  . BREAST LUMPECTOMY Right 06/12/2018   [facility], lobular features  . BREAST LUMPECTOMY WITH SENTINEL LYMPH NODE BIOPSY Right 06/12/2018   Procedure: RIGHT BREAST WIDE EXCISION WITH SENTINEL LYMPH NODE BX;  Surgeon: Robert Bellow, MD;  Location: [facility] ORS;  Service: General;  Laterality: Right;  . CHOLECYSTECTOMY    . [employer]  . JOINT REPLACEMENT    . ovaraian cyst  removal Right    Cyst only (NOT OVARY)  . TOTAL KNEE ARTHROPLASTY Bilateral 05/05/2010    Social History   Socioeconomic History  . Marital status: Single    Spouse name: Not on file  . Number of children: Not on  file  . Years of education: Not on file  . Highest education level: Not on file  Occupational History  . Occupation: retired  Scientific laboratory technician  . Financial resource strain: Not hard at all  . Food insecurity:    Worry: Never true    Inability: Never true  . Transportation needs:    Medical: No    Non-medical: No  Tobacco Use  . Smoking status: Never Smoker  . Smokeless tobacco: Never Used  Substance and Sexual Activity  . Alcohol use: No    Alcohol/week: 0.0 standard drinks  . Drug use: No  . Sexual activity: Never  Lifestyle  . Physical activity:    Days per week: Not on file    Minutes per session: Not on file  . Stress: Not at all  Relationships  . Social connections:    Talks on phone: Not on file    Gets together: Not on file    Attends religious service: Not on file    Active member of club or organization: Not on file    Attends meetings of clubs or organizations: Not on file    Relationship status: Not on file  . Intimate partner violence:    Fear of current or ex partner: Not on file    Emotionally abused: Not on file    Physically abused: Not on file    Forced sexual activity: Not on file  Other Topics Concern  . Not on file  Social History Narrative   Retired    Lives by herself    Pets: None   Caffeine- Coffee 2 cups daily, no tea/soda       Family History  Problem Relation Age of Onset  . Alcohol abuse Mother        deceased 33  . Heart disease Father        deceased 22  . Diabetes Father   . Breast cancer Maternal Aunt        dx 61s; deceased 53s  . Breast cancer Maternal Aunt        dx 94s; deceased 39s     Current Outpatient Medications:  .  amLODipine (NORVASC) 5 MG tablet, TAKE 1 TABLET BY MOUTH  DAILY (Patient taking differently: Take 5 mg by mouth daily. ), Disp: 90 tablet, Rfl: 1 .  atorvastatin (LIPITOR) 80 MG tablet, TAKE 1 TABLET BY MOUTH  DAILY (Patient taking differently: Take 80 mg by mouth every evening. ), Disp: 90 tablet, Rfl:  1 .  CALCIUM-VITAMIN D PO, Take 1 tablet by mouth daily. , Disp: , Rfl:  .  CRANBERRY PO, Take 1 tablet by mouth 2 (two) times daily. , Disp: , Rfl:  .  Multiple Vitamins tablet, Take 1 tablet by mouth daily. , Disp: , Rfl:  .  Omega-3 Fatty Acids (FISH OIL PO), Take 1 capsule by mouth daily. , Disp: , Rfl:  .  oxybutynin (DITROPAN) 5 MG tablet, TAKE 1 TABLET BY MOUTH TWO  TIMES DAILY (Patient taking differently: Take 5 mg by mouth 2 (two) times daily. ), Disp: 180 tablet, Rfl: 1 .  pantoprazole (PROTONIX) 40 MG tablet, TAKE 1 TABLET BY MOUTH  DAILY (Patient taking differently: Take 40 mg by mouth  daily. ), Disp: 90 tablet, Rfl: 1 .  potassium chloride (K-DUR,KLOR-CON) 10 MEQ tablet, TAKE 1 TABLET BY MOUTH  DAILY (Patient taking differently: Take 10 mEq by mouth daily. ), Disp: 90 tablet, Rfl: 1 .  torsemide (DEMADEX) 5 MG tablet, TAKE 1 TABLET BY MOUTH  DAILY (Patient taking differently: Take 5 mg by mouth daily. ), Disp: 90 tablet, Rfl: 1 .  Turmeric 500 MG CAPS, Take 1,000 mg by mouth daily. , Disp: , Rfl:  .  HYDROcodone-acetaminophen (NORCO/VICODIN) 5-325 MG tablet, Take 1 tablet by mouth every 4 (four) hours as needed for moderate pain. (Patient not taking: Reported on 07/04/2018), Disp: 15 tablet, Rfl: 0  Physical exam:  Vitals:   07/04/18 1327  BP: (!) 143/80  Pulse: 82  Resp: 18  Temp: 98.1 F (36.7 C)  TempSrc: Tympanic  Weight: 195 lb (88.5 kg)   Physical Exam Constitutional:      General: She is not in acute distress. HENT:     Head: Normocephalic and atraumatic.  Eyes:     Pupils: Pupils are equal, round, and reactive to light.  Neck:     Musculoskeletal: Normal range of motion.  Cardiovascular:     Rate and Rhythm: Normal rate and regular rhythm.     Heart sounds: Normal heart sounds.  Pulmonary:     Effort: Pulmonary effort is normal.     Breath sounds: Normal breath sounds.  Abdominal:     General: Bowel sounds are normal.     Palpations: Abdomen is soft.   Skin:    General: Skin is warm and dry.  Neurological:     Mental Status: She is alert and oriented to person, place, and time.      CMP Latest Ref Rng & Units 06/12/2018  Glucose 70 - 99 mg/dL 108(H)  BUN 8 - 23 mg/dL 7(L)  Creatinine 0.44 - 1.00 mg/dL 0.48  Sodium 135 - 145 mmol/L 139  Potassium 3.5 - 5.1 mmol/L 3.5  Chloride 98 - 111 mmol/L 104  CO2 22 - 32 mmol/L 27  Calcium 8.9 - 10.3 mg/dL 9.1  Total Protein 6.0 - 8.3 g/dL -  Total Bilirubin 0.2 - 1.2 mg/dL -  Alkaline Phos 39 - 117 U/L -  AST 0 - 37 U/L -  ALT 0 - 35 U/L -   CBC Latest Ref Rng & Units 06/12/2018  WBC 4.0 - 10.5 K/uL 8.7  Hemoglobin 12.0 - 15.0 g/dL 13.7  Hematocrit 36.0 - 46.0 % 42.2  Platelets 150 - 400 K/uL 246    No images are attached to the encounter.  Nm Sentinel Node Injection  Result Date: 06/12/2018 CLINICAL DATA:  Right breast cancer. EXAM: NUCLEAR MEDICINE BREAST LYMPHOSCINTIGRAPHY TECHNIQUE: Intradermal injection of radiopharmaceutical was performed at the 12 o'clock, 3 o'clock, 6 o'clock, and 9 o'clock positions around the right nipple. The patient was then sent to the operating room where the sentinel node(s) were identified and removed by the surgeon. RADIOPHARMACEUTICALS:  Total of 1 mCi Millipore-filtered Technetium-30msulfur colloid, injected in four aliquots of 0.25 mCi each. IMPRESSION: Uncomplicated intradermal injection of a total of 1 mCi Technetium-924mulfur colloid for purposes of sentinel node identification. Electronically Signed   By: JaCorrie Mckusick.O.   On: 06/12/2018 10:21   Mm Breast Surgical Specimen  Result Date: 06/12/2018 CLINICAL DATA:  Evaluate specimen EXAM: SPECIMEN RADIOGRAPH OF THE RIGHT BREAST COMPARISON:  Previous exam(s). FINDINGS: Status post excision of the right breast. The biopsy clip and associated mass  are on the medial edge of the specimen. These findings were called to the surgeon. IMPRESSION: Specimen radiograph of the right breast. Electronically  Signed   By: Dorise Bullion III M.D   On: 06/12/2018 14:03   US Breast Complete Uni Right Inc Axilla  Result Date: 06/04/2018 Ultrasound of the right breast was completed to confirm the area was be visible for wide local excision should the patient choose breast conservation.  In the 11 o'clock position approximately 8 cm from the nipple a hypoechoic mass taller than it was wide measuring 0.5 x 0.7 x 1.05 cm is noted. A biopsy clip is noted at the superior aspect of the lesion.  BI-RADS-6.    Assessment and plan- Patient is a 78 y.o. female with newly diagnosed pathological prognostic stage Ib invasive lobular carcinoma of the right breast pT2 pN1 acM0 ER PR positive HER-2/neu negative status post lumpectomy  We have discussed patient's case at tumor board earlier this week.  Given that there was a much larger tumor at the time of final lumpectomy then seen on mammograms and she still has a positive medial margin-plan is to proceed with an MRI of her bilateral breast to a certain if there is any residual disease that would require more excision and possibly a mastectomy.  Patient will be seeing Dr. Tollie Pizza after her MRI to discuss reexcision of the positive margins versus mastectomy.  Patient still meets z 11 criteria despite having one sentinel lymph node positive and would therefore not require axillary lymph node dissection.  Given that her MammaPrint score came back at high risk she would benefit from adjuvant chemotherapy.  Given her age I would not recommend anthracycline-based chemo and would recommend 4 cycles of Taxotere and Cytoxan given IV every 3 weeks for 4 cycles with ongoing Neulasta support.  I will tentatively see her back in 3 weeks time to start her first dose of chemotherapy.  She will get port placement by Dr. Tollie Pizza.  She will also attend chemo class prior to starting chemotherapy.  Discussed risks and benefits of chemotherapy including all but not limited to fatigue, nausea,  vomiting, low blood counts, hair loss risk of infections and hospitalization and peripheral neuropathy.  Treatment will be given with a curative intent.  Patient understands and agrees to proceed as planned.  Cancer Staging Malignant neoplasm of right female breast [facility]) Staging form: Breast, AJCC 8th Edition - Clinical stage from 06/04/2018: Stage IA (cT1c, cN0, cM0, G1, ER+, PR+, HER2-) - Signed by Sindy Guadeloupe, MD on 06/07/2018 - Pathologic stage from 07/04/2018: Stage IB (pT2, pN1a, cM0, G2, ER+, PR+, HER2-) - Signed by Sindy Guadeloupe, MD on 07/04/2018     Total face to face encounter time for this patient visit was 40 min. >50% of the time was  spent in counseling and coordination of care.     Visit Diagnosis 1. Malignant neoplasm of right female breast, unspecified estrogen receptor status, unspecified site of breast ([place])   2. Goals of care, counseling/discussion      Dr. Randa Evens, MD, MPH [facility] at [facility] [phone] 07/04/2018 4:04 PM

## 2018-07-04 NOTE — Progress Notes (Signed)
START ON PATHWAY REGIMEN - Breast     A cycle is every 21 days:     Docetaxel      Cyclophosphamide   **Always confirm dose/schedule in your pharmacy ordering system**  Patient Characteristics: Postoperative without Neoadjuvant Therapy (Pathologic Staging), Invasive Disease, Adjuvant Therapy, HER2 Negative/Unknown/Equivocal, ER Positive, Node Positive, Node Positive (1-3), MammaPrint(R) Ordered, High Genomic Risk Therapeutic Status: Postoperative without Neoadjuvant Therapy (Pathologic Staging) AJCC Grade: G2 AJCC N Category: pN1a AJCC M Category: cM0 ER Status: Positive (+) AJCC 8 Stage Grouping: IB HER2 Status: Negative (-) Oncotype Dx Recurrence Score: Ordered Other Genomic Test AJCC T Category: pT2 PR Status: Positive (+) Has this patient completed genomic testing<= Yes - MammaPrint(R) MammaPrint(R) Score: High Genomic Risk Intent of Therapy: Curative Intent, Discussed with Patient

## 2018-07-04 NOTE — Progress Notes (Signed)
Here for follow up. Feeling " good "

## 2018-07-05 ENCOUNTER — Other Ambulatory Visit: Payer: Medicare Other

## 2018-07-08 ENCOUNTER — Telehealth: Payer: Self-pay | Admitting: *Deleted

## 2018-07-08 NOTE — Telephone Encounter (Signed)
Pt called Theresa Reid and told her she has a tooth problem and needs to go to dentist.I told Theresa Reid that she needs to get the tooth fixed before she is suppose to come to start chemo 4/13

## 2018-07-11 ENCOUNTER — Telehealth: Payer: Self-pay | Admitting: Genetic Counselor

## 2018-07-11 NOTE — Telephone Encounter (Signed)
Cancer Genetics             Telegenetics Results Disclosure   Patient Name: Theresa Reid Patient DOB: 04/03/1941 Patient Age: 78 y.o. Phone Call Date: 07/11/2018  Referring Provider: Randa Evens, MD    Ms. Ranum was called today to discuss genetic test results. Please see the Genetics telephone note from 06/06/2018 for a detailed discussion of her personal and family histories and the recommendations provided.  Genetic Testing: At the time of Ms. Menon's telegenetics visit, she decided to pursue genetic testing of multiple genes associated with hereditary susceptibility to cancer. Testing included sequencing and deletion/duplication analysis. Testing did not reveal a pathogenic mutation in any of the genes analyzed.  A copy of the genetic test report will be scanned into Epic under the Media tab.  A Variant of Uncertain Significance was detected: ATM c.[id number] (p.Arg692Cys) . This is still considered a normal result. While at this time, it is unknown if this finding is associated with increased cancer risk, the majority of these variants get reclassified to be inconsequential. Medical management should not be based on this finding. The lab will eventually determine the significance, if any. If it gets reclassified by the lab, she will receive a new report via their secure portal, by secure email, or by mail. Ms. Grandmaison should stay in contact with [place] on a yearly basis if she is interested in knowing the status of this result and keep her address and phone number up to date in the system.  Interpretation: These results suggest that Ms. Villela's breast cancer was most likely not due to an inherited predisposition. Most cancers happen by chance and this test, along with details of her family history, suggests that her cancer falls into this category.   Since the current test is not perfect, it is possible that there may be a gene mutation that current testing  cannot detect, but that chance is small. It is possible that a different genetic factor, which has not yet been discovered or is not on this panel, is responsible for the cancer diagnoses in the family. Again, the likelihood of this is low. No additional testing is recommended at this time for Ms. Putz.  Genes Analyzed: The genes analyzed were the 84 genes on [facility]'s Multi-Cancer panel (AIP, ALK, APC, ATM, AXIN2, BAP1, BARD1, BLM, BMPR1A, BRCA1, BRCA2, BRIP1, CASR, CDC73, CDH1, CDK4, CDKN1B, CDKN1C, CDKN2A, CEBPA, CHEK2, CTNNA1, DICER1, DIS3L2, EGFR, EPCAM, FH, FLCN, GATA2, GPC3, GREM1, HOXB13, HRAS, KIT, MAX, MEN1, MET, MITF, MLH1, MSH2, MSH3, MSH6, MUTYH, NBN, NF1, NF2, NTHL1, PALB2, PDGFRA, PHOX2B, PMS2, POLD1, POLE, POT1, PRKAR1A, PTCH1, PTEN, RAD50, RAD51C, RAD51D, RB1, RECQL4, RET, RUNX1, SDHA, SDHAF2, SDHB, SDHC, SDHD, SMAD4, SMARCA4, SMARCB1, SMARCE1, STK11, SUFU, TERC, TERT, TMEM127, TP53, TSC1, TSC2, VHL, WRN, WT1).  Cancer Screening: Ms. Newsham is recommended to follow the cancer screening guidelines provided by her physicians.   Family Members: Family members are at some increased risk of developing cancer, over the general population risk, simply due to the family history. They are recommended to speak with their own providers about appropriate cancer screenings.   Women are recommended to have a yearly mammogram beginning at age 74, a yearly clinical breast exam, a yearly gynecologic exam and perform monthly breast self-exams. Colon cancer screening is recommended to begin by age 76 in both men and women, unless there is a  family history of colon cancer or colon polyps or an individual has a personal history to warrant initiating screening at a younger age.  Any relative who had cancer at a young age or had a particularly rare cancer may also wish to pursue genetic testing. Genetic counselors can be located in other cities, by visiting the website of the [employer] ([employer]) and Field seismologist for a Dietitian by zip code.   Family members are not recommended to get tested for the above VUS outside of a research protocol as this finding has no implications for their medical management.  Follow-Up: Cancer genetics is a rapidly advancing field and it is possible that new genetic tests will be appropriate for Ms. Ryback in the future. Ms. Davee is encouraged to remain in contact with Genetics on an annual basis so we can update her personal and family histories, and let her know of advances in cancer genetics that may benefit the family. Ms. Eno questions were answered to her satisfaction today, and she knows she is welcome to call anytime with additional questions.    Steele Berg, MS, [place] Certified Genetic Counselor phone: [phone]

## 2018-07-12 ENCOUNTER — Other Ambulatory Visit: Payer: Self-pay

## 2018-07-12 ENCOUNTER — Ambulatory Visit
Admission: RE | Admit: 2018-07-12 | Discharge: 2018-07-12 | Disposition: A | Discharge status: Home / Self Care | Payer: Medicare Other | Source: Ambulatory Visit | Attending: General Surgery | Admitting: General Surgery

## 2018-07-12 ENCOUNTER — Telehealth: Payer: Self-pay | Admitting: General Surgery

## 2018-07-12 DIAGNOSIS — Z1231 Encounter for screening mammogram for malignant neoplasm of breast: Secondary | ICD-10-CM

## 2018-07-12 NOTE — Telephone Encounter (Signed)
The patient has been called and was advised that the office will call her once we have more information.

## 2018-07-12 NOTE — Telephone Encounter (Signed)
Patient is calling said she went to have her MRI done today, but they were unable to do her MRI and said they would be calling the office to let us know and have the patient r/s. Please call and advise.

## 2018-07-15 ENCOUNTER — Ambulatory Visit: Payer: Medicare Other | Admitting: Family Medicine

## 2018-07-16 ENCOUNTER — Other Ambulatory Visit: Payer: Self-pay | Admitting: Oncology

## 2018-07-17 ENCOUNTER — Telehealth: Payer: Self-pay | Admitting: General Surgery

## 2018-07-17 MED ORDER — DIAZEPAM 5 MG PO TABS: 5.0000 mg | ORAL_TABLET | Freq: Once | ORAL | 0 refills | Status: AC

## 2018-07-17 NOTE — Telephone Encounter (Signed)
The patient was unable to complete the MRI of the breast secondary to claustrophobia.  The office was NOT notified that the study had not been completed or why.  The patient is amenable to a repeat attempt with po valium prior to the procedure.   She reports having this once before and it did indeed make her "loopy".  She is aware that she will need a driver.  RX for Valium, 5 mg one hour prior to the procedure.

## 2018-07-22 ENCOUNTER — Ambulatory Visit
Admission: RE | Admit: 2018-07-22 | Discharge: 2018-07-22 | Disposition: A | Discharge status: Home / Self Care | Payer: Medicare Other | Source: Ambulatory Visit | Attending: General Surgery | Admitting: General Surgery

## 2018-07-22 ENCOUNTER — Other Ambulatory Visit: Payer: Self-pay

## 2018-07-22 DIAGNOSIS — Z1231 Encounter for screening mammogram for malignant neoplasm of breast: Secondary | ICD-10-CM | POA: Insufficient documentation

## 2018-07-22 DIAGNOSIS — D0501 Lobular carcinoma in situ of right breast: Secondary | ICD-10-CM | POA: Diagnosis not present

## 2018-07-22 MED ORDER — GADOBUTROL 1 MMOL/ML IV SOLN
8.0000 mL | Freq: Once | INTRAVENOUS | Status: AC | PRN
  Administered 2018-07-22: 8 mL via INTRAVENOUS

## 2018-07-23 ENCOUNTER — Telehealth: Payer: Self-pay

## 2018-07-23 NOTE — Telephone Encounter (Signed)
Telephone call to Mrs. Fontanilla to discuss the chemotherapy education class over the phone due to current situation with coronavirus.  Patient is very agreeable to telecommunication about chemotherapy and I will provide her with her personal binder upon arriving at cancer center.  A copy of Chemotherapy teaching guidelines and education about taxotere and cytoxan e-mailed to the patient.   I will call again to schedule the chemotherapy class via telephone.

## 2018-07-24 ENCOUNTER — Telehealth: Payer: Self-pay | Admitting: *Deleted

## 2018-07-24 NOTE — Telephone Encounter (Signed)
Patient contacted today and notified per Dr. Dwyane Luo message.   The patient states she does not have an iPhone and does have a computer with email but does not have a web cam. Patient is requesting a telephonic visit.   This has been scheduled for 07-25-18 at 11:20 am.  Patient aware that if she has not received a call by 11:30 am to call the office for an update. She verbalizes understanding.

## 2018-07-24 NOTE — Telephone Encounter (Signed)
-----   Message from Robert Bellow, MD sent at 07/24/2018  8:58 AM EDT ----- Please notify the patient I reviewed her MRI.  We need to set up a virtual visit to review options for management. Thanks.

## 2018-07-25 ENCOUNTER — Encounter: Payer: Self-pay | Admitting: *Deleted

## 2018-07-25 ENCOUNTER — Other Ambulatory Visit: Payer: Self-pay

## 2018-07-25 ENCOUNTER — Telehealth: Payer: Self-pay | Admitting: *Deleted

## 2018-07-25 ENCOUNTER — Telehealth (INDEPENDENT_AMBULATORY_CARE_PROVIDER_SITE_OTHER): Payer: Medicare Other | Admitting: General Surgery

## 2018-07-25 DIAGNOSIS — C50411 Malignant neoplasm of upper-outer quadrant of right female breast: Secondary | ICD-10-CM

## 2018-07-25 DIAGNOSIS — Z170 Estrogen receptor positive status [ER+]: Secondary | ICD-10-CM

## 2018-07-25 NOTE — Progress Notes (Signed)
Called patient today to follow-up on treatment plan.  States she spoke to Dr. Bary Castilla today and she is to decide on lumpectomy or mastectomy.  She  Also states surgery and chemo may be on hold for a little while due the Coronavirus.  States Dr. Bary Castilla said he would discuss with Dr. Janese Banks and decide on plan by Monday.  Offered support.  She is to call if she has any questions or needs.

## 2018-07-25 NOTE — Progress Notes (Signed)
Virtual Visit via Telephone Note  I connected with Theresa Reid on 07/25/18 at 11:47 EDT by telephone and verified that I am speaking with the correct person using two identifiers.  After our conversation, I contacted her daughter, Theresa Reid.   I discussed the limitations, risks, security and privacy concerns of performing an evaluation and management service by telephone and the availability of in person appointments. I also discussed with the patient that there may be a patient responsible charge related to this service. The patient expressed understanding and agreed to proceed.   History of Present Illness: The patient underwent wide excision and sentinel node biopsy on June 12, 2018.  At that time she was found to have a minimum 24 mm tumor with a positive medial margin and 1 of 3 lymph nodes positive for macro metastatic disease.  She underwent MammaPrint testing and was found to be at high risk for recurrence on antiestrogen alone.  She was originally scheduled for a breast MRI on July 12, 2018 but was unable to tolerate the study due to claustrophobia.  She is since that time underwent an MRI with oral Valium (5 mg) with excellent resolution of her claustrophobia on July 22, 2018.  Today's visit was to review the results of that study.   Observations/Objective: The MRI showed evidence of residual disease essentially medially and inferior tracking towards the nipple.  This would have been the positive margin noted at the time of surgery.  We discussed the possibility of MRI overestimating residual disease.  Assessment and Plan: The patient's options at this time include 1) return to the operating room for reexcision to obtain negative margins with the possibility these would not be identified intraoperatively likely necessitating a return to the operating room at a later date for formal mastectomy, or proceeding to mastectomy at that time if a positive margin after maximum resection  had been completed versus 2) proceeding directly to mastectomy.  Pros and cons of each treatment course as well as the need for adjuvant radiation at a future date were discussed.  The patient is in a high risk group for intervention at this time (her original surgery was done before the blanket prohibition on "elective" breast surgery had been in place.  She was advised that present recommendations as of this morning are to treat hormone positive patients with an antiestrogen until the [place] 19 pandemic has resolved.  I spoke with Dr. Janese Banks about the possibility of instituting antiestrogen therapy to at least get [place] through the end of this month with the hopes that the prohibition on elective surgery would have lifted by that time.  There is a [place] earned that she would not be able to initiate adjuvant chemotherapy if it was more than 90 days out from her original February 19 operative procedure.  Regardless of what treatment option the patient chooses (reexcision versus mastectomy) I think we could have her ready for adjuvant treatment by that May 19 "dropdead date"  Follow Up Instructions:  The patient will consider her options regarding surgical intervention, and certainly if the plan is to initiate antiestrogen therapy during this pandemic (Dr. Janese Banks will be contacting the patient tomorrow in this regard) she has longer to consider her options.   I discussed the assessment and treatment plan with the patient. The patient was provided an opportunity to ask questions and all were answered. The patient agreed with the plan and demonstrated an understanding of the instructions.   The patient was  advised to call back or seek an in-person evaluation if the symptoms worsen or if the condition fails to improve as anticipated.  I provided 20 minutes of non-face-to-face time during this encounter.   Robert Bellow, MD

## 2018-07-25 NOTE — Telephone Encounter (Signed)
I called patient to let her know today that Dr. Judithann Graves and Dr. Tollie Pizza had spoke about her case and she feels like that she would like to do a video conference with you tomorrow to go over the plan.  Patient if she had a video on her telephone or email and she does not.  Her daughter does have it and has the ability of having that video chat and she wanted the daughter to be with her when she talks to Dr. Judithann Graves and Dr. Janese Banks wanted the daughter to be with the patient when she had a conversation also.  I called the daughter. [name] and she said she would go over to her mom's house in the morning and we can have a video chat and then telephone number for her cell is [phone], Theresa Reid is [email or web address].  We will do a video conference tomorrow and I will call at 56 just so I can make sure all the allergies medications pharmacy history is still up-to-date.  Daughter and patient agreeable to above

## 2018-07-26 ENCOUNTER — Encounter: Payer: Self-pay | Admitting: Oncology

## 2018-07-26 ENCOUNTER — Inpatient Hospital Stay: Payer: Medicare Other | Attending: Oncology | Admitting: Oncology

## 2018-07-26 ENCOUNTER — Other Ambulatory Visit: Payer: Self-pay

## 2018-07-26 DIAGNOSIS — Z7189 Other specified counseling: Secondary | ICD-10-CM

## 2018-07-26 DIAGNOSIS — Z170 Estrogen receptor positive status [ER+]: Secondary | ICD-10-CM | POA: Diagnosis not present

## 2018-07-26 DIAGNOSIS — C50411 Malignant neoplasm of upper-outer quadrant of right female breast: Secondary | ICD-10-CM

## 2018-07-26 MED ORDER — ANASTROZOLE 1 MG PO TABS: 1.0000 mg | ORAL_TABLET | Freq: Every day | ORAL | 3 refills | Status: DC

## 2018-07-26 NOTE — Progress Notes (Signed)
Pt will have video chat about breast cancer pathology and what to plan to make

## 2018-07-29 ENCOUNTER — Telehealth: Payer: Self-pay

## 2018-07-29 NOTE — Telephone Encounter (Signed)
Patient called and she has spoken with her Oncologist, Dr Janese Banks. They have decided to post pone her Chemotherapy for 4-6 weeks. She will think about what she would like to do as far as breast surgery and will get back with [place] by the end of the week with her decision. Dr Bary Castilla notified of such.

## 2018-07-30 NOTE — Progress Notes (Addendum)
I connected with Theresa Reid  on 07/30/18 at 10:15 AM EDTby webex and verified that I am speaking with her using 2 identifiers.  Patient consented to visit via webex   I discussed the limitations, risks, security and privacy concerns of performing an evaluation and management service by webex and the availability of in person appointments.  I also discussed with the patient that there may be a patient responsible charge related to the service.  The patient expressed understanding and agrees to proceed.  Location of patient Home Location of provider: [facility] Names of people participating in the visit: Patient and daughter  Chief Complaint: discuss further management of breast cancer  History of present illness: patient is a 78 year old female with no prior history of abnormal mammograms. She recently underwent a screening mammogram on 05/22/2018 which showed Suspicious mass in the 11 o'clock position of the right breast about 1 cm in size. Normal appearing axilla. She underwent a core biopsy which showed invasive mammary carcinoma with lobular features. 4 mm, grade 1, ER greater than 90% positive, PR greater than 90% positive and HER-2/neu negative. She had menarche at the age of 88. She has 2 children. Menopause around 20 years of age. She used birth control briefly in the 35s. No hormone replacement therapy. Family history significant for maternal aunts 2 of them who had breast cancer and her great grandmother who had breast cancer.   Patient underwent lumpectomy and sentinel lymph node biopsy on 06/12/2018.  Pathology showed invasive lobular carcinoma with positive medial margin which was reexcised and still positive.  Tumor size was 24 mm, grade 2 ER PR positive and HER-2/neu negative.  One sentinel lymph node was excised and was positive for macro metastases 8 mm.  No extranodal extension present.  Lymphovascular extension was present.  MammaPrint was sent off which came back  as high risk with an average 10-year risk of untreated disease at 29%  Patient underwent MRI of her bilateral breasts on 07/22/2018 which showed additional suspicious non-mass enhancement along the inferior margin of the biopsy cavity extending 2 cm highly suspicious for continuous disease and another highly suspicious linear non-mass enhancement to the base of the nipple concerning for multifocal multicentric disease.  No evidence of metastatic lymphadenopathy.  Patient had a discussion with Dr. Tollie Pizza and she is still debating whether she wants to go for an extended lumpectomy versus mastectomy.  However due to ongoing COVID pandemic her surgery has currently been put on hold.  Overall she feels well at this time and denies any specific complaints.  Review of Systems  Constitutional: Negative for chills, fever, malaise/fatigue and weight loss.  HENT: Negative for congestion, ear discharge and nosebleeds.   Eyes: Negative for blurred vision.  Respiratory: Negative for cough, hemoptysis, sputum production, shortness of breath and wheezing.   Cardiovascular: Negative for chest pain, palpitations, orthopnea and claudication.  Gastrointestinal: Negative for abdominal pain, blood in stool, constipation, diarrhea, heartburn, melena, nausea and vomiting.  Genitourinary: Negative for dysuria, flank pain, frequency, hematuria and urgency.  Musculoskeletal: Negative for back pain, joint pain and myalgias.  Skin: Negative for rash.  Neurological: Negative for dizziness, tingling, focal weakness, seizures, weakness and headaches.  Endo/Heme/Allergies: Does not bruise/bleed easily.  Psychiatric/Behavioral: Negative for depression and suicidal ideas. The patient does not have insomnia.      Observation/objective: She appears comfortable during this video conversation.  She does not appear to be fatigued in distress or short of breath.  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  However MRI shows more extensive disease and she may land up undergoing a mastectomy in the future.  However due to the ongoing covid pandemic her surgery has currently been put on hold.  Given that she has not yet completed her surgery-I will also hold off on adjuvant chemotherapy at this time.  I also explained to her that if we give her adjuvant chemotherapy it could be associated with complications such as neutropenic fever which at her age in the presence of a covert pandemic can pose higher risk of morbidity and her therapy for her.  Her tumor is strongly ER PR positive and I would therefore recommend starting on Arimidex at this time while we are waiting for surgery and considering adjuvant chemotherapy after surgery   Given that she is postmenopausal I would favor at least 5 years if not 10 years of adjuvant hormone therapy with aromatase inhibitor. I discussed the risks and benefits of Arimidex including all but not limited to fatigue, hypercholesterolemia, hot flashes, arthralgias and worsening bone health.  Patient will also need to be on calcium 1200 mg along with vitamin D 800 international units.  We will obtain a baseline bone density scan down the line electively.  Treatment will be given with a curative intent. Patient verbalized understanding and agrees to proceed     Follow-up instructions: I will see her back in 6 weeks via webex visit  I discussed the assessment and treatment plan with the patient.  The patient was provided an opportunity to ask questions and all were answered.  The patient agreed with the plan and demonstrated understanding of instructions.  The patient was advised to call back or seek an  in person evaluation if the symptoms worsen or if the condition fails to improve as anticipated.  I provided  30 minutes of face-to-face time during this encounter. Statement visit provided via web ex visit virtually  Visit Diagnosis: 1. Goals of care, counseling/discussion   2. Malignant neoplasm of upper-outer quadrant of right breast in female, estrogen receptor positive ([place])     Dr. Randa Evens, MD, MPH [facility] [phone] 07/30/2018 7:31 AM

## 2018-07-31 ENCOUNTER — Inpatient Hospital Stay: Payer: Medicare Other

## 2018-08-02 ENCOUNTER — Other Ambulatory Visit: Payer: Medicare Other

## 2018-08-02 ENCOUNTER — Telehealth: Payer: Self-pay | Admitting: *Deleted

## 2018-08-02 NOTE — Telephone Encounter (Signed)
Patient called and stated that she has decided to go ahead with the right breast mastectomy.

## 2018-08-05 ENCOUNTER — Ambulatory Visit: Payer: Medicare Other

## 2018-08-05 ENCOUNTER — Ambulatory Visit: Payer: Medicare Other | Admitting: Oncology

## 2018-08-05 ENCOUNTER — Inpatient Hospital Stay: Payer: Medicare Other

## 2018-08-05 NOTE — Telephone Encounter (Signed)
Patient contacted today and notified that we did receive her message.   We also did check with Dr. Bary Castilla about surgery scheduling and he has advised that due to COVID-19 restrictions we postpone surgery until restrictions have been lifted. Patient expresses a concern about waiting to have surgery and wants to know how long she can safely wait.   Patient aware the surgery restriction with O.R. scheduling now is until beginning of June but with the uncertainty of the virus this could change.   The patient is aware we will contact her as soon as restrictions are lifted in regards to surgery scheduling. A pre-op visit with Dr. Bary Castilla will also need to be arranged at that time.  Patient aware to call the office in the meantime if she has further concerns. She verbalizes understanding and appreciative of the call.

## 2018-08-05 NOTE — Telephone Encounter (Signed)
Scheduling sheet on Dr. Bary Castilla' s desk to fill out.

## 2018-08-15 ENCOUNTER — Telehealth: Payer: Self-pay | Admitting: General Surgery

## 2018-08-15 NOTE — Telephone Encounter (Signed)
I contacted the patient to see how she was tolerating her antiestrogen therapy.  She reports she is taking it every morning and is not aware of any ill effects.  She was made aware that were still in a holding pattern due to the pandemic, but the data suggests we can wait up to 6-12 months if we absolutely had to without ill effect.  We will let the system restart in the next couple weeks and then plan hopefully on surgery in the next 4-6 weeks.  We will get together for an in person visit before hand to review options for management.  I had met informally with radiation oncology with the patient's most recent thoughts towards mastectomy.  If she had a an axillary dissection at that time and less than 4 nodes were positive and her tumor was less than 5 cm in diameter she would not meet criteria for postmastectomy radiation.  We will review with her closer to the planned surgery date.

## 2018-09-02 ENCOUNTER — Telehealth: Payer: Self-pay | Admitting: *Deleted

## 2018-09-02 NOTE — Telephone Encounter (Signed)
Patient was contacted today about scheduling a surgery date for right mastectomy.   The patient states today she is unsure if she wants a lumpectomy vs. mastectomy even though she decided previously on a mastectomy.   Patient has a pre-op visit with Dr. Bary Castilla on Thursday, 09-05-18 at 9:45 am. She wishes to further discuss this in person with Dr. Bary Castilla before we schedule her surgery.

## 2018-09-05 ENCOUNTER — Encounter: Payer: Self-pay | Admitting: General Surgery

## 2018-09-05 ENCOUNTER — Ambulatory Visit (INDEPENDENT_AMBULATORY_CARE_PROVIDER_SITE_OTHER): Payer: Medicare Other | Admitting: General Surgery

## 2018-09-05 ENCOUNTER — Other Ambulatory Visit: Payer: Self-pay

## 2018-09-05 VITALS — BP 142/74 | HR 91 | Temp 97.7°F | Ht 64.0 in | Wt 195.0 lb

## 2018-09-05 DIAGNOSIS — C50411 Malignant neoplasm of upper-outer quadrant of right female breast: Secondary | ICD-10-CM

## 2018-09-05 DIAGNOSIS — Z170 Estrogen receptor positive status [ER+]: Secondary | ICD-10-CM

## 2018-09-05 NOTE — Progress Notes (Signed)
Patient ID: Theresa Reid, female   DOB: 01-15-1941, 78 y.o.   MRN: [id number]  Chief Complaint  Patient presents with  . Pre-op Exam    Right mastectomy vs lumpectomy    HPI Theresa Reid is a 78 y.o. female here today to discuss surgical options, right mastectomy vs lumpectomy.  Daughter, Theresa Reid is present at visit.   HPI  Past Medical History:  Diagnosis Date  . Arthritis   . Breast cancer ([place])   . Diverticulitis   . Family history of breast cancer   . GERD (gastroesophageal reflux disease)   . Hyperlipidemia   . Hypertension     Past Surgical History:  Procedure Laterality Date  . BREAST BIOPSY Right 05/28/2018   [place] bx, INVASIVE MAMMARY CARCINOMA WITH LOBULAR FEATURES  . BREAST LUMPECTOMY Right 06/12/2018   [facility], lobular features  . BREAST LUMPECTOMY WITH SENTINEL LYMPH NODE BIOPSY Right 06/12/2018   Procedure: RIGHT BREAST WIDE EXCISION WITH SENTINEL LYMPH NODE BX;  Surgeon: Robert Bellow, MD;  Location: [facility] ORS;  Service: General;  Laterality: Right;  . CHOLECYSTECTOMY    . [place]  . JOINT REPLACEMENT    . ovaraian cyst removal Right    Cyst only (NOT OVARY)  . TOTAL KNEE ARTHROPLASTY Bilateral 05/05/2010    Family History  Problem Relation Age of Onset  . Alcohol abuse Mother        deceased 76  . Diabetes Father   . Breast cancer Maternal Aunt        dx 76s; deceased 59s  . Breast cancer Maternal Aunt        dx 51s; deceased 24s    Social History Social History   Tobacco Use  . Smoking status: Never Smoker  . Smokeless tobacco: Never Used  Substance Use Topics  . Alcohol use: No    Alcohol/week: 0.0 standard drinks  . Drug use: No    No Known Allergies  Current Outpatient Medications  Medication Sig Dispense Refill  . amLODipine (NORVASC) 5 MG tablet TAKE 1 TABLET BY MOUTH  DAILY (Patient taking differently: Take 5 mg by mouth daily. ) 90 tablet 1  . anastrozole (ARIMIDEX) 1 MG tablet Take 1 tablet (1 mg total) by  mouth daily. 30 tablet 3  . atorvastatin (LIPITOR) 80 MG tablet TAKE 1 TABLET BY MOUTH  DAILY (Patient taking differently: Take 80 mg by mouth every evening. ) 90 tablet 1  . CALCIUM-VITAMIN D PO Take 1 tablet by mouth daily.     Marland Kitchen CRANBERRY PO Take 1 tablet by mouth 2 (two) times daily.     . Multiple Vitamins tablet Take 1 tablet by mouth daily.     . Omega-3 Fatty Acids (FISH OIL PO) Take 1 capsule by mouth daily.     Marland Kitchen oxybutynin (DITROPAN) 5 MG tablet TAKE 1 TABLET BY MOUTH TWO  TIMES DAILY (Patient taking differently: Take 5 mg by mouth 2 (two) times daily. ) 180 tablet 1  . pantoprazole (PROTONIX) 40 MG tablet TAKE 1 TABLET BY MOUTH  DAILY (Patient taking differently: Take 40 mg by mouth daily. ) 90 tablet 1  . potassium chloride (K-DUR,KLOR-CON) 10 MEQ tablet TAKE 1 TABLET BY MOUTH  DAILY (Patient taking differently: Take 10 mEq by mouth daily. ) 90 tablet 1  . torsemide (DEMADEX) 5 MG tablet TAKE 1 TABLET BY MOUTH  DAILY (Patient taking differently: Take 5 mg by mouth daily. ) 90 tablet 1  . Turmeric 500  MG CAPS Take 1,000 mg by mouth daily.      No current facility-administered medications for this visit.     Review of Systems Review of Systems  Constitutional: Negative.   Respiratory: Negative.   Cardiovascular: Negative.     Blood pressure (!) 142/74, pulse 91, temperature 97.7 F (36.5 C), temperature source Skin, height 5\' 4"  (1.626 m), weight 195 lb (88.5 kg), SpO2 97 %.  Physical Exam Physical Exam Exam conducted with a chaperone present.  Constitutional:      Appearance: She is well-developed.  HENT:     Mouth/Throat:     Pharynx: No oropharyngeal exudate.  Eyes:     General: No scleral icterus.    Conjunctiva/sclera: Conjunctivae normal.  Neck:     Musculoskeletal: Neck supple.  Cardiovascular:     Rate and Rhythm: Normal rate and regular rhythm.     Heart sounds: Normal heart sounds.  Pulmonary:     Effort: Pulmonary effort is normal.     Breath sounds:  Normal breath sounds.  Chest:     Breasts:        Right: No inverted nipple, mass, nipple discharge, skin change or tenderness.        Left: No inverted nipple, mass, nipple discharge, skin change or tenderness.       Comments: Wide excision right breast Lymphadenopathy:     Cervical: No cervical adenopathy.     Upper Body:     Right upper body: No supraclavicular or axillary adenopathy.     Left upper body: No supraclavicular or axillary adenopathy.  Skin:    General: Skin is warm and dry.  Neurological:     Mental Status: She is alert and oriented to person, place, and time.  Psychiatric:        Behavior: Behavior normal.     Data Reviewed June 12, 2018 wide excision results: A. BREAST, RIGHT UPPER QUADRANT; WIDE EXCISION:  - INVASIVE LOBULAR CARCINOMA.  - CLIP AND BIOPSY SITE IDENTIFIED.  - SEE CANCER SUMMARY.   B. BREAST, RIGHT UPPER QUADRANT, NEW MEDIAL MARGIN; EXCISION:  - INVASIVE LOBULAR CARCINOMA, FOCALLY PRESENT AT NEW MEDIAL MARGIN.   C. LYMPH NODE, RIGHT SENTINEL; EXCISION:  - ONE LYMPH NODE, INVOLVED BY MACRO-METASTATIC CARCINOMA (1/1).   CANCER CASE SUMMARY: INVASIVE CARCINOMA OF THE BREAST  Procedure: Excision  Specimen Laterality: Right  Tumor Size: 24 mm (16 mm in original specimen + 8 mm from medial  re-excision)  Histologic Type: Invasivelobular carcinoma  Histologic Grade ([place] Histologic Score)            Glandular (Acinar)/Tubular Differentiation: Score 3            Nuclear Pleomorphism: Score 2            Mitotic Rate: Score 1            Overall Grade: Grade 2  Ductal Carcinoma In Situ (DCIS): Not identified  Margins:            Invasive Carcinoma Margins: Positive for invasive  carcinoma            Medial, unifocal    Breast MRI of July 22, 2018 in part: There is also highly suspicious linear non-mass enhancement within the retroareolar RIGHT breast, overall  measuring approximately 8 cm extent, nearly contiguous from the inferior margin of the biopsy cavity to the base of the nipple. Lymph nodes: No abnormal appearing lymph nodes.  The case was discussed with Noreene Filbert, MD from  radiation oncology regarding recommendations for postmastectomy radiation.  Depending on the extent of disease, this might or might not be considered.  Without axillary dissection she would certainly be a candidate for at least consideration of radiation (age 58) in addition to her planned adjuvant chemotherapy based on a high MammaPrint score.  I had a long discussion with the patient and her daughter, Theresa Reid, regarding options for management of the axilla including: 1) proceeding with attempt wide excision sacrificing the nipple areolar complex to include the area of suspicion on the MRI followed by whole breast axillary radiation versus 2) simple mastectomy with postmastectomy radiation to the axilla/chest wall after adjuvant chemotherapy versus 3) mastectomy with axillary dissection.  If the patient had less than 3 nodes positive there would be no indication for additional radiation therapy to the axilla, and depending on the findings in the breast she might or might not be a candidate for chest wall radiation.  After all reviewing all of these options in detail, over the course of the 30-minute visit, of which more than 50% was clinical decision-making, the patient is decided to proceed to mastectomy with axillary dissection.  Assessment Invasive lobular carcinoma of the breast with macro metastatic disease, positive margins and a high MammaPrint score.  Plan  We will arrange for surgical intervention in the near future.  Plan for an outpatient procedure.  The patient will be getting a Adriamycin/Cytoxan chemotherapy.  Central venous access has been requested by Dr. Janese Banks.  Port procedure reviewed by phone with the patient.  We had discussed this at an earlier  appointment.  She has reviewed the indications for this procedure.  More than 50% of today's 30-minute visit was spent reviewing options for management.  HPI, assessment, plan and physical exam has been scribed under the direction and in the presence of Robert Bellow, MD. Karie Fetch, RN  I have completed the exam and reviewed the above documentation for accuracy and completeness.  I agree with the above.  Haematologist has been used and any errors in dictation or transcription are unintentional.  Hervey Ard, M.D., F.A.C.S.  Forest Gleason Brooke Payes 09/06/2018, 8:18 AM

## 2018-09-05 NOTE — Patient Instructions (Addendum)
The patient is aware to call back for any questions or new concerns.  Patient's surgery to be scheduled for 5/22/20at [facility] with Dr. Bary Castilla  She will have Covid-19 testing done on 09/10/18.   The patient is aware to Pre-Admit and we will call her with that date and time.    She is aware to check in at the [place] entrance where she will be screened for the coronavirus and then sent to Same Day Surgery.   Patient aware that she may have no visitors and driver will need to wait in the car due to COVID-19 restrictions.   The patient verbalizes understanding of the above.   The patient is aware to call the office should he/she have further questions.

## 2018-09-06 ENCOUNTER — Telehealth: Payer: Self-pay | Admitting: *Deleted

## 2018-09-06 ENCOUNTER — Inpatient Hospital Stay: Payer: Medicare Other | Attending: Oncology | Admitting: Oncology

## 2018-09-06 DIAGNOSIS — Z5181 Encounter for therapeutic drug level monitoring: Secondary | ICD-10-CM

## 2018-09-06 DIAGNOSIS — Z79899 Other long term (current) drug therapy: Secondary | ICD-10-CM

## 2018-09-06 DIAGNOSIS — Z7189 Other specified counseling: Secondary | ICD-10-CM | POA: Diagnosis not present

## 2018-09-06 DIAGNOSIS — C50411 Malignant neoplasm of upper-outer quadrant of right female breast: Secondary | ICD-10-CM | POA: Diagnosis not present

## 2018-09-06 DIAGNOSIS — Z170 Estrogen receptor positive status [ER+]: Secondary | ICD-10-CM | POA: Diagnosis not present

## 2018-09-06 DIAGNOSIS — Z79811 Long term (current) use of aromatase inhibitors: Secondary | ICD-10-CM | POA: Diagnosis not present

## 2018-09-06 NOTE — Telephone Encounter (Signed)
Patient's surgery date has been moved from 09-13-18 to 09-20-18 at [facility].   The patient is aware to pre-admit and be tested for COVID-19 on 09-17-18 at 11 am. She is aware she will need to enter through the [place] and isolate after.   Patient aware to call the office should she have further questions. She verbalizes understanding.

## 2018-09-06 NOTE — Progress Notes (Signed)
Patient verified using two identifiers for virtual visit via telephone today.  Patient reports that she is tolerating Letrozole and does not have any concerns today.

## 2018-09-09 ENCOUNTER — Encounter: Payer: Self-pay | Admitting: Oncology

## 2018-09-09 NOTE — Progress Notes (Signed)
I connected with Theresa Reid on 09/09/18 at  9:45 AM EDT by video enabled telemedicine visit and verified that I am speaking with the correct person using two identifiers.   I discussed the limitations, risks, security and privacy concerns of performing an evaluation and management service by telemedicine and the availability of in-person appointments. I also discussed with the patient that there may be a patient responsible charge related to this service. The patient expressed understanding and agreed to proceed.  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 Assess tolerance to arimidex  History of present illness: patient is a 78 year old female with no prior history of abnormal mammograms. She recently underwent a screening mammogram on 05/22/2018 which showed Suspicious mass in the 11 o'clock position of the right breast about 1 cm in size. Normal appearing axilla. She underwent a core biopsy which showed invasive mammary carcinoma with lobular features. 4 mm, grade 1, ER greater than 90% positive, PR greater than 90% positive and HER-2/neu negative. She had menarche at the age of 55. She has 2 children. Menopause around 54 years of age. She used birth control briefly in the 86s. No hormone replacement therapy. Family history significant for maternal aunts 2 of them who had breast cancer and her great grandmother who had breast cancer.  Patient underwent lumpectomy and sentinel lymph node biopsy on 06/12/2018. Pathology showed invasive lobular carcinoma with positive medial margin which was reexcised and still positive. Tumor size was 24 mm, grade 2 ER PR positive and HER-2/neu negative. One sentinel lymph node was excised and was positive for macro metastases 8 mm. No extranodal extension present. Lymphovascular  extension was present. MammaPrint was sent off which came back as high risk with an average 10-year risk of untreated disease at 29%  Patient underwent MRI of her bilateral breasts on 07/22/2018 which showed additional suspicious non-mass enhancement along the inferior margin of the biopsy cavity extending 2 cm highly suspicious for continuous disease and another highly suspicious linear non-mass enhancement to the base of the nipple concerning for multifocal multicentric disease.  No evidence of metastatic lymphadenopathy.    Interval history: Patient was started on arimidex in march an dtolerating it well so far without significant side effects.her surgery is scheduled for end of this month   Review of Systems  Constitutional: Negative for chills, fever, malaise/fatigue and weight loss.  HENT: Negative for congestion, ear discharge and nosebleeds.   Eyes: Negative for blurred vision.  Respiratory: Negative for cough, hemoptysis, sputum production, shortness of breath and wheezing.   Cardiovascular: Negative for chest pain, palpitations, orthopnea and claudication.  Gastrointestinal: Negative for abdominal pain, blood in stool, constipation, diarrhea, heartburn, melena, nausea and vomiting.  Genitourinary: Negative for dysuria, flank pain, frequency, hematuria and urgency.  Musculoskeletal: Negative for back pain, joint pain and myalgias.  Skin: Negative for rash.  Neurological: Negative for dizziness, tingling, focal weakness, seizures, weakness and headaches.  Endo/Heme/Allergies: Does not bruise/bleed easily.  Psychiatric/Behavioral: Negative for depression and suicidal ideas. The patient does not have insomnia.     No Known Allergies  Past Medical History:  Diagnosis Date  . Arthritis   . Breast cancer ([place])   . Diverticulitis   . Family history of breast cancer   . GERD (gastroesophageal reflux disease)   . Hyperlipidemia   . Hypertension     Past  Surgical History:   Procedure Laterality Date  . BREAST BIOPSY Right 05/28/2018   [place] bx, INVASIVE MAMMARY CARCINOMA WITH LOBULAR FEATURES  . BREAST LUMPECTOMY Right 06/12/2018   [facility], lobular features  . BREAST LUMPECTOMY WITH SENTINEL LYMPH NODE BIOPSY Right 06/12/2018   Procedure: RIGHT BREAST WIDE EXCISION WITH SENTINEL LYMPH NODE BX;  Surgeon: Robert Bellow, MD;  Location: [facility] ORS;  Service: General;  Laterality: Right;  . CHOLECYSTECTOMY    . [place]  . JOINT REPLACEMENT    . ovaraian cyst removal Right    Cyst only (NOT OVARY)  . TOTAL KNEE ARTHROPLASTY Bilateral 05/05/2010    Social History   Socioeconomic History  . Marital status: Single    Spouse name: Not on file  . Number of children: Not on file  . Years of education: Not on file  . Highest education level: Not on file  Occupational History  . Occupation: retired  Scientific laboratory technician  . Financial resource strain: Not hard at all  . Food insecurity:    Worry: Never true    Inability: Never true  . Transportation needs:    Medical: No    Non-medical: No  Tobacco Use  . Smoking status: Never Smoker  . Smokeless tobacco: Never Used  Substance and Sexual Activity  . Alcohol use: No    Alcohol/week: 0.0 standard drinks  . Drug use: No  . Sexual activity: Never  Lifestyle  . Physical activity:    Days per week: Not on file    Minutes per session: Not on file  . Stress: Not at all  Relationships  . Social connections:    Talks on phone: Not on file    Gets together: Not on file    Attends religious service: Not on file    Active member of club or organization: Not on file    Attends meetings of clubs or organizations: Not on file    Relationship status: Not on file  . Intimate partner violence:    Fear of current or ex partner: Not on file    Emotionally abused: Not on file    Physically abused: Not on file    Forced sexual activity: Not on file  Other Topics Concern  . Not on file  Social History  Narrative   Retired    Lives by herself    Pets: None   Caffeine- Coffee 2 cups daily, no tea/soda       Family History  Problem Relation Age of Onset  . Alcohol abuse Mother        deceased 60  . Diabetes Father   . Breast cancer Maternal Aunt        dx 102s; deceased 18s  . Breast cancer Maternal Aunt        dx 49s; deceased 41s     Current Outpatient Medications:  .  amLODipine (NORVASC) 5 MG tablet, TAKE 1 TABLET BY MOUTH  DAILY (Patient taking differently: Take 5 mg by mouth daily. ), Disp: 90 tablet, Rfl: 1 .  anastrozole (ARIMIDEX) 1 MG tablet, Take 1 tablet (1 mg total) by mouth daily., Disp: 30 tablet, Rfl: 3 .  atorvastatin (LIPITOR) 80 MG tablet, TAKE 1 TABLET BY MOUTH  DAILY (Patient taking differently: Take 80 mg by mouth every evening. ), Disp: 90 tablet, Rfl: 1 .  CALCIUM-VITAMIN D PO, Take 1 tablet by mouth daily. , Disp: , Rfl:  .  CRANBERRY PO, Take 1 tablet by mouth  2 (two) times daily. , Disp: , Rfl:  .  Multiple Vitamins tablet, Take 1 tablet by mouth daily. , Disp: , Rfl:  .  Omega-3 Fatty Acids (FISH OIL PO), Take 1 capsule by mouth daily. , Disp: , Rfl:  .  oxybutynin (DITROPAN) 5 MG tablet, TAKE 1 TABLET BY MOUTH TWO  TIMES DAILY (Patient taking differently: Take 5 mg by mouth 2 (two) times daily. ), Disp: 180 tablet, Rfl: 1 .  pantoprazole (PROTONIX) 40 MG tablet, TAKE 1 TABLET BY MOUTH  DAILY (Patient taking differently: Take 40 mg by mouth daily. ), Disp: 90 tablet, Rfl: 1 .  potassium chloride (K-DUR,KLOR-CON) 10 MEQ tablet, TAKE 1 TABLET BY MOUTH  DAILY (Patient taking differently: Take 10 mEq by mouth daily. ), Disp: 90 tablet, Rfl: 1 .  torsemide (DEMADEX) 5 MG tablet, TAKE 1 TABLET BY MOUTH  DAILY (Patient taking differently: Take 5 mg by mouth daily. ), Disp: 90 tablet, Rfl: 1 .  Turmeric 500 MG CAPS, Take 1,000 mg by mouth daily. , Disp: , Rfl:   No results found.  No images are attached to the encounter.   CMP Latest Ref Rng & Units 06/12/2018   Glucose 70 - 99 mg/dL 108(H)  BUN 8 - 23 mg/dL 7(L)  Creatinine 0.44 - 1.00 mg/dL 0.48  Sodium 135 - 145 mmol/L 139  Potassium 3.5 - 5.1 mmol/L 3.5  Chloride 98 - 111 mmol/L 104  CO2 22 - 32 mmol/L 27  Calcium 8.9 - 10.3 mg/dL 9.1  Total Protein 6.0 - 8.3 g/dL -  Total Bilirubin 0.2 - 1.2 mg/dL -  Alkaline Phos 39 - 117 U/L -  AST 0 - 37 U/L -  ALT 0 - 35 U/L -   CBC Latest Ref Rng & Units 06/12/2018  WBC 4.0 - 10.5 K/uL 8.7  Hemoglobin 12.0 - 15.0 g/dL 13.7  Hematocrit 36.0 - 46.0 % 42.2  Platelets 150 - 400 K/uL 246     Observation/objective: appears in no acute distress over video visit today. Breathing is non labored.   Assessment and plan:Patient is a 78 year old female withpathological prognostic stage Ib invasive lobular carcinoma of the right breast pT2 pN1 acM0 ER PR positive HER-2/neu negative status post lumpectomy. This is f/u visit for breast cancer on arimidex  I have asked patient to stop taking her arimidex few days before surgery. She will be undergoing mastectomy without reconstruction with ALND. She will not e receiving adjuvant radiation treatment.   Her mammaprintscore was high risk and she therefore needs adjuvant chemotherapy. I would prefer non anthracycline regimen taxotere and cytoxan IV Q3 weeks X4 cycles. If there are more LN positive at the time of surgery, I may consider anthracycline regimen  I explained to the patient the risks and benefits of chemotherapy including all but not limited to nausea, vomiting, low blood counts and risk of infection and hospitalization. Risk of peripheral neuropathy associated with taxotere. Patient understands and agrees to proceed as planned. Treatment will be given with a curative intent. She will not restart arimidex until chemotherapy is completed   Follow-up instructions:See Dr. Janese Banks on 10/04/18 to discuss pathology results.   I discussed the assessment and treatment plan with the patient. The patient was provided an  opportunity to ask questions and all were answered. The patient agreed with the plan and demonstrated an understanding of the instructions.   The patient was advised to call back or seek an in-person evaluation if the symptoms worsen or if  the condition fails to improve as anticipated.  I provided 30 minutes of face-to-face video visit time during this encounter, and > 50% was spent counseling as documented under my assessment & plan.  Visit Diagnosis: 1. Goals of care, counseling/discussion   2. Malignant neoplasm of upper-outer quadrant of right breast in female, estrogen receptor positive ([place])   3. Visit for monitoring Arimidex therapy     Dr. Randa Evens, MD, MPH [address] [facility] at [facility] Pager- [phone] 09/09/2018 12:49 PM

## 2018-09-10 ENCOUNTER — Inpatient Hospital Stay: Admission: RE | Admit: 2018-09-10 | Payer: Medicare Other | Source: Ambulatory Visit

## 2018-09-17 ENCOUNTER — Other Ambulatory Visit: Payer: Self-pay

## 2018-09-17 ENCOUNTER — Encounter
Admission: RE | Admit: 2018-09-17 | Discharge: 2018-09-17 | Disposition: A | Discharge status: Home / Self Care | Payer: Medicare Other | Source: Ambulatory Visit | Attending: General Surgery | Admitting: General Surgery

## 2018-09-17 DIAGNOSIS — C773 Secondary and unspecified malignant neoplasm of axilla and upper limb lymph nodes: Secondary | ICD-10-CM | POA: Diagnosis not present

## 2018-09-17 DIAGNOSIS — C50811 Malignant neoplasm of overlapping sites of right female breast: Secondary | ICD-10-CM | POA: Diagnosis not present

## 2018-09-17 DIAGNOSIS — E785 Hyperlipidemia, unspecified: Secondary | ICD-10-CM | POA: Diagnosis not present

## 2018-09-17 DIAGNOSIS — K219 Gastro-esophageal reflux disease without esophagitis: Secondary | ICD-10-CM | POA: Diagnosis not present

## 2018-09-17 DIAGNOSIS — Z1159 Encounter for screening for other viral diseases: Secondary | ICD-10-CM | POA: Insufficient documentation

## 2018-09-17 DIAGNOSIS — Z79899 Other long term (current) drug therapy: Secondary | ICD-10-CM | POA: Diagnosis not present

## 2018-09-17 DIAGNOSIS — D1801 Hemangioma of skin and subcutaneous tissue: Secondary | ICD-10-CM | POA: Diagnosis not present

## 2018-09-17 DIAGNOSIS — Z01812 Encounter for preprocedural laboratory examination: Secondary | ICD-10-CM | POA: Insufficient documentation

## 2018-09-17 DIAGNOSIS — Z803 Family history of malignant neoplasm of breast: Secondary | ICD-10-CM | POA: Diagnosis not present

## 2018-09-17 DIAGNOSIS — D241 Benign neoplasm of right breast: Secondary | ICD-10-CM | POA: Diagnosis not present

## 2018-09-17 DIAGNOSIS — Z96653 Presence of artificial knee joint, bilateral: Secondary | ICD-10-CM | POA: Diagnosis not present

## 2018-09-17 DIAGNOSIS — M1990 Unspecified osteoarthritis, unspecified site: Secondary | ICD-10-CM | POA: Diagnosis not present

## 2018-09-17 DIAGNOSIS — I10 Essential (primary) hypertension: Secondary | ICD-10-CM | POA: Diagnosis not present

## 2018-09-17 DIAGNOSIS — N6031 Fibrosclerosis of right breast: Secondary | ICD-10-CM | POA: Diagnosis not present

## 2018-09-17 LAB — POTASSIUM: Potassium: 3.9 mmol/L (ref 3.5–5.1)

## 2018-09-17 NOTE — Patient Instructions (Signed)
Your procedure is scheduled on: 09-20-18 FRIDAY Report to Same Day Surgery 2nd floor medical mall [facility] Entrance-take elevator on left to 2nd floor.  Check in with surgery information desk.) To find out your arrival time please call [phone] between 1PM - 3PM on 09-19-18 THURSDAY  Remember: Instructions that are not followed completely may result in serious medical risk, up to and including death, or upon the discretion of your surgeon and anesthesiologist your surgery may need to be rescheduled.    _x___ 1. Do not eat food after midnight the night before your procedure. NO GUM OR CANDY AFTER MIDNIGHT. You may drink clear liquids up to 2 hours before you are scheduled to arrive at the hospital for your procedure.  Do not drink clear liquids within 2 hours of your scheduled arrival to the hospital.  Clear liquids include  --Water or Apple juice without pulp  --Clear carbohydrate beverage such as ClearFast or Gatorade  --Black Coffee or Clear Tea (No milk, no creamers, do not add anything to the coffee or Tea   ____Ensure clear carbohydrate drink on the way to the hospital for bariatric patients  ____Ensure clear carbohydrate drink 3 hours before surgery for Dr Dwyane Luo patients if physician instructed.     __x__ 2. No Alcohol for 24 hours before or after surgery.   __x__3. No Smoking or e-cigarettes for 24 prior to surgery.  Do not use any chewable tobacco products for at least 6 hour prior to surgery   ____  4. Bring all medications with you on the day of surgery if instructed.    __x__ 5. Notify your doctor if there is any change in your medical condition     (cold, fever, infections).    x___6. On the morning of surgery brush your teeth with toothpaste and water.  You may rinse your mouth with mouth wash if you wish.  Do not swallow any toothpaste or mouthwash.   Do not wear jewelry, make-up, hairpins, clips or nail polish.  Do not wear lotions, powders, or perfumes.  You may wear deodorant.  Do not shave 48 hours prior to surgery. Men may shave face and neck.  Do not bring valuables to the hospital.    [facility] is not responsible for any belongings or valuables.               Contacts, dentures or bridgework may not be worn into surgery.  Leave your suitcase in the car. After surgery it may be brought to your room.  For patients admitted to the hospital, discharge time is determined by your treatment team.  _  Patients discharged the day of surgery will not be allowed to drive home.  You will need someone to drive you home and stay with you the night of your procedure.    Please read over the following fact sheets that you were given:   [facility] Preparing for Surgery  _x___ TAKE THE FOLLOWING MEDICATION THE MORNING OF SURGERY WITH A SMALL SIP OF WATER. These include:  1. DITROPAN (OXYBUTININ)  2. PROTONIX (PANTOPRAZOLE)  3. TAKE AN EXTRA PROTONIX THE NIGHT BEFORE YOUR SURGERY  4.  5.  6.  ____Fleets enema or Magnesium Citrate as directed.   _x___ Use CHG Soap or sage wipes as directed on instruction sheet   ____ Use inhalers on the day of surgery and bring to hospital day of surgery  ____ Stop Metformin and Janumet 2 days prior to surgery.  ____ Take 1/2 of usual insulin dose the night before surgery and none on the morning surgery.   ____ Follow recommendations from Cardiologist, Pulmonologist or PCP regarding  stopping Aspirin, Coumadin, Plavix ,Eliquis, Effient, or Pradaxa, and Pletal.  ____Stop Anti-inflammatories such as Advil, Aleve, Ibuprofen, Motrin, Naproxen, Naprosyn, Goodies powders or aspirin products. OK to take Tylenol    _x___ Stop supplements until after surgery-STOP CRANBERRY, FISH OIL AND TURMERIC NOW-MAY RESUME AFTER SURGERY   ____ Bring C-Pap to the hospital.

## 2018-09-18 LAB — NOVEL CORONAVIRUS, NAA (HOSP ORDER, SEND-OUT TO REF LAB; TAT 18-24 HRS): SARS-CoV-2, NAA: NOT DETECTED

## 2018-09-19 MED ORDER — CEFAZOLIN SODIUM-DEXTROSE 2-4 GM/100ML-% IV SOLN
2.0000 g | INTRAVENOUS | Status: AC
  Administered 2018-09-20: 2 g via INTRAVENOUS

## 2018-09-20 ENCOUNTER — Ambulatory Visit: Payer: Medicare Other

## 2018-09-20 ENCOUNTER — Ambulatory Visit: Payer: Medicare Other | Admitting: Anesthesiology

## 2018-09-20 ENCOUNTER — Other Ambulatory Visit: Payer: Self-pay

## 2018-09-20 ENCOUNTER — Encounter
Admission: RE | Disposition: A | Discharge status: Home / Self Care | Payer: Self-pay | Source: Home / Self Care | Attending: General Surgery

## 2018-09-20 ENCOUNTER — Ambulatory Visit
Admission: RE | Admit: 2018-09-20 | Discharge: 2018-09-20 | Disposition: A | Discharge status: Home / Self Care | Payer: Medicare Other | Attending: General Surgery | Admitting: General Surgery

## 2018-09-20 ENCOUNTER — Encounter: Payer: Self-pay | Admitting: *Deleted

## 2018-09-20 DIAGNOSIS — Z95828 Presence of other vascular implants and grafts: Secondary | ICD-10-CM

## 2018-09-20 DIAGNOSIS — M1990 Unspecified osteoarthritis, unspecified site: Secondary | ICD-10-CM | POA: Insufficient documentation

## 2018-09-20 DIAGNOSIS — D241 Benign neoplasm of right breast: Secondary | ICD-10-CM | POA: Diagnosis not present

## 2018-09-20 DIAGNOSIS — Z79899 Other long term (current) drug therapy: Secondary | ICD-10-CM | POA: Diagnosis not present

## 2018-09-20 DIAGNOSIS — Z170 Estrogen receptor positive status [ER+]: Secondary | ICD-10-CM

## 2018-09-20 DIAGNOSIS — C773 Secondary and unspecified malignant neoplasm of axilla and upper limb lymph nodes: Secondary | ICD-10-CM | POA: Diagnosis not present

## 2018-09-20 DIAGNOSIS — I10 Essential (primary) hypertension: Secondary | ICD-10-CM | POA: Insufficient documentation

## 2018-09-20 DIAGNOSIS — E785 Hyperlipidemia, unspecified: Secondary | ICD-10-CM | POA: Insufficient documentation

## 2018-09-20 DIAGNOSIS — Z1159 Encounter for screening for other viral diseases: Secondary | ICD-10-CM | POA: Insufficient documentation

## 2018-09-20 DIAGNOSIS — C50811 Malignant neoplasm of overlapping sites of right female breast: Secondary | ICD-10-CM | POA: Diagnosis not present

## 2018-09-20 DIAGNOSIS — N6031 Fibrosclerosis of right breast: Secondary | ICD-10-CM | POA: Diagnosis not present

## 2018-09-20 DIAGNOSIS — Z803 Family history of malignant neoplasm of breast: Secondary | ICD-10-CM | POA: Diagnosis not present

## 2018-09-20 DIAGNOSIS — C50911 Malignant neoplasm of unspecified site of right female breast: Secondary | ICD-10-CM | POA: Diagnosis not present

## 2018-09-20 DIAGNOSIS — D1801 Hemangioma of skin and subcutaneous tissue: Secondary | ICD-10-CM | POA: Diagnosis not present

## 2018-09-20 DIAGNOSIS — Z96653 Presence of artificial knee joint, bilateral: Secondary | ICD-10-CM | POA: Insufficient documentation

## 2018-09-20 DIAGNOSIS — C50411 Malignant neoplasm of upper-outer quadrant of right female breast: Secondary | ICD-10-CM | POA: Diagnosis not present

## 2018-09-20 DIAGNOSIS — K219 Gastro-esophageal reflux disease without esophagitis: Secondary | ICD-10-CM | POA: Insufficient documentation

## 2018-09-20 DIAGNOSIS — Z452 Encounter for adjustment and management of vascular access device: Secondary | ICD-10-CM | POA: Diagnosis not present

## 2018-09-20 HISTORY — PX: PORTACATH PLACEMENT: SHX2246

## 2018-09-20 HISTORY — PX: MASTECTOMY WITH AXILLARY LYMPH NODE DISSECTION: SHX5661

## 2018-09-20 SURGERY — MASTECTOMY WITH AXILLARY LYMPH NODE DISSECTION
Anesthesia: General | Laterality: Right

## 2018-09-20 MED ORDER — MIDAZOLAM HCL 2 MG/2ML IJ SOLN
INTRAMUSCULAR | Status: DC | PRN
  Administered 2018-09-20: 2 mg via INTRAVENOUS

## 2018-09-20 MED ORDER — PROMETHAZINE HCL 25 MG/ML IJ SOLN: 6.2500 mg | INTRAMUSCULAR | Status: DC | PRN

## 2018-09-20 MED ORDER — LACTATED RINGERS IV SOLN
INTRAVENOUS | Status: DC
  Administered 2018-09-20: 07:00:00 via INTRAVENOUS

## 2018-09-20 MED ORDER — DEXAMETHASONE SODIUM PHOSPHATE 10 MG/ML IJ SOLN
INTRAMUSCULAR | Status: DC | PRN
  Administered 2018-09-20: 10 mg via INTRAVENOUS

## 2018-09-20 MED ORDER — FENTANYL CITRATE (PF) 100 MCG/2ML IJ SOLN
INTRAMUSCULAR | Status: DC | PRN
  Administered 2018-09-20: 25 ug via INTRAVENOUS
  Administered 2018-09-20: 50 ug via INTRAVENOUS
  Administered 2018-09-20: 25 ug via INTRAVENOUS
  Administered 2018-09-20 (×2): 50 ug via INTRAVENOUS
  Administered 2018-09-20 (×2): 25 ug via INTRAVENOUS

## 2018-09-20 MED ORDER — FENTANYL CITRATE (PF) 100 MCG/2ML IJ SOLN
INTRAMUSCULAR | Status: AC
  Filled 2018-09-20: qty 2

## 2018-09-20 MED ORDER — PROPOFOL 10 MG/ML IV BOLUS
INTRAVENOUS | Status: DC | PRN
  Administered 2018-09-20: 200 mg via INTRAVENOUS

## 2018-09-20 MED ORDER — PROPOFOL 10 MG/ML IV BOLUS
INTRAVENOUS | Status: AC
  Filled 2018-09-20: qty 20

## 2018-09-20 MED ORDER — ACETAMINOPHEN 160 MG/5ML PO SOLN: 325.0000 mg | ORAL | Status: DC | PRN

## 2018-09-20 MED ORDER — METHYLENE BLUE 0.5 % INJ SOLN
INTRAVENOUS | Status: AC
  Filled 2018-09-20: qty 10

## 2018-09-20 MED ORDER — MIDAZOLAM HCL 2 MG/2ML IJ SOLN
INTRAMUSCULAR | Status: AC
  Filled 2018-09-20: qty 2

## 2018-09-20 MED ORDER — DEXMEDETOMIDINE HCL 200 MCG/2ML IV SOLN
INTRAVENOUS | Status: DC | PRN
  Administered 2018-09-20 (×2): 12 ug via INTRAVENOUS

## 2018-09-20 MED ORDER — LIDOCAINE HCL (PF) 2 % IJ SOLN
INTRAMUSCULAR | Status: AC
  Filled 2018-09-20: qty 10

## 2018-09-20 MED ORDER — ONDANSETRON HCL 4 MG/2ML IJ SOLN
INTRAMUSCULAR | Status: AC
  Filled 2018-09-20: qty 2

## 2018-09-20 MED ORDER — DEXMEDETOMIDINE HCL IN NACL 80 MCG/20ML IV SOLN
INTRAVENOUS | Status: AC
  Filled 2018-09-20: qty 20

## 2018-09-20 MED ORDER — FENTANYL CITRATE (PF) 100 MCG/2ML IJ SOLN: 25.0000 ug | INTRAMUSCULAR | Status: DC | PRN

## 2018-09-20 MED ORDER — CEFAZOLIN SODIUM-DEXTROSE 2-4 GM/100ML-% IV SOLN
INTRAVENOUS | Status: AC
  Filled 2018-09-20: qty 100

## 2018-09-20 MED ORDER — CEFAZOLIN SODIUM 1 G IJ SOLR
INTRAMUSCULAR | Status: AC
  Filled 2018-09-20: qty 10

## 2018-09-20 MED ORDER — DEXAMETHASONE SODIUM PHOSPHATE 10 MG/ML IJ SOLN
INTRAMUSCULAR | Status: AC
  Filled 2018-09-20: qty 1

## 2018-09-20 MED ORDER — SODIUM CHLORIDE (PF) 0.9 % IJ SOLN
INTRAMUSCULAR | Status: DC | PRN
  Administered 2018-09-20: 20 mL via INTRAVENOUS

## 2018-09-20 MED ORDER — KETOROLAC TROMETHAMINE 30 MG/ML IJ SOLN
INTRAMUSCULAR | Status: AC
  Administered 2018-09-20: 30 mg via INTRAVENOUS
  Filled 2018-09-20: qty 1

## 2018-09-20 MED ORDER — LIDOCAINE HCL (PF) 1 % IJ SOLN
INTRAMUSCULAR | Status: AC
  Filled 2018-09-20: qty 30

## 2018-09-20 MED ORDER — ACETAMINOPHEN 10 MG/ML IV SOLN
INTRAVENOUS | Status: AC
  Filled 2018-09-20: qty 100

## 2018-09-20 MED ORDER — MEPERIDINE HCL 50 MG/ML IJ SOLN: 6.2500 mg | INTRAMUSCULAR | Status: DC | PRN

## 2018-09-20 MED ORDER — LIDOCAINE HCL (CARDIAC) PF 100 MG/5ML IV SOSY
PREFILLED_SYRINGE | INTRAVENOUS | Status: DC | PRN
  Administered 2018-09-20: 60 mg via INTRAVENOUS

## 2018-09-20 MED ORDER — SODIUM CHLORIDE (PF) 0.9 % IJ SOLN
INTRAMUSCULAR | Status: AC
  Filled 2018-09-20: qty 50

## 2018-09-20 MED ORDER — KETOROLAC TROMETHAMINE 30 MG/ML IJ SOLN
30.0000 mg | Freq: Once | INTRAMUSCULAR | Status: AC | PRN
  Administered 2018-09-20: 11:00:00 30 mg via INTRAVENOUS

## 2018-09-20 MED ORDER — LIDOCAINE HCL 1 % IJ SOLN
INTRAMUSCULAR | Status: DC | PRN
  Administered 2018-09-20: 10 mL

## 2018-09-20 MED ORDER — ONDANSETRON HCL 4 MG/2ML IJ SOLN
INTRAMUSCULAR | Status: DC | PRN
  Administered 2018-09-20: 4 mg via INTRAVENOUS

## 2018-09-20 MED ORDER — ACETAMINOPHEN 10 MG/ML IV SOLN
INTRAVENOUS | Status: DC | PRN
  Administered 2018-09-20: 1000 mg via INTRAVENOUS

## 2018-09-20 MED ORDER — ACETAMINOPHEN 325 MG PO TABS: 325.0000 mg | ORAL_TABLET | ORAL | Status: DC | PRN

## 2018-09-20 MED ORDER — HYDROCODONE-ACETAMINOPHEN 7.5-325 MG PO TABS: 1.0000 | ORAL_TABLET | Freq: Once | ORAL | Status: DC | PRN

## 2018-09-20 MED ORDER — BUPIVACAINE-EPINEPHRINE (PF) 0.5% -1:200000 IJ SOLN
INTRAMUSCULAR | Status: AC
  Filled 2018-09-20: qty 30

## 2018-09-20 MED ORDER — CEFAZOLIN SODIUM-DEXTROSE 1-4 GM/50ML-% IV SOLN
INTRAVENOUS | Status: DC | PRN
  Administered 2018-09-20: 1 g via INTRAVENOUS

## 2018-09-20 SURGICAL SUPPLY — 64 items
APPLIER CLIP 11 MED OPEN (CLIP)
APPLIER CLIP 13 LRG OPEN (CLIP)
BINDER BREAST XLRG (GAUZE/BANDAGES/DRESSINGS) ×4 IMPLANT
BLADE PHOTON ILLUMINATED (MISCELLANEOUS) IMPLANT
BLADE SURG 15 STRL SS SAFETY (BLADE) ×8 IMPLANT
BULB RESERV EVAC DRAIN JP 100C (MISCELLANEOUS) ×4 IMPLANT
CANISTER SUCT 1200ML W/VALVE (MISCELLANEOUS) ×4 IMPLANT
CHLORAPREP W/TINT 26 (MISCELLANEOUS) ×8 IMPLANT
CLIP APPLIE 11 MED OPEN (CLIP) IMPLANT
CLIP APPLIE 13 LRG OPEN (CLIP) IMPLANT
CLOSURE WOUND 1/2 X4 (GAUZE/BANDAGES/DRESSINGS) ×3
CNTNR SPEC 2.5X3XGRAD LEK (MISCELLANEOUS) ×6
CONT SPEC 4OZ STER OR WHT (MISCELLANEOUS) ×6
CONTAINER SPEC 2.5X3XGRAD LEK (MISCELLANEOUS) ×6 IMPLANT
COVER LIGHT HANDLE STERIS (MISCELLANEOUS) ×8 IMPLANT
COVER WAND RF STERILE (DRAPES) ×4 IMPLANT
DECANTER SPIKE VIAL GLASS SM (MISCELLANEOUS) ×8 IMPLANT
DRAIN CHANNEL JP 15F RND 16 (MISCELLANEOUS) ×4 IMPLANT
DRAPE C-ARM XRAY 36X54 (DRAPES) ×4 IMPLANT
DRAPE LAPAROTOMY TRNSV 106X77 (MISCELLANEOUS) ×4 IMPLANT
DRSG GAUZE FLUFF 36X18 (GAUZE/BANDAGES/DRESSINGS) ×12 IMPLANT
DRSG TEGADERM 2-3/8X2-3/4 SM (GAUZE/BANDAGES/DRESSINGS) ×4 IMPLANT
DRSG TEGADERM 4X4.75 (GAUZE/BANDAGES/DRESSINGS) ×4 IMPLANT
DRSG TELFA 3X8 NADH (GAUZE/BANDAGES/DRESSINGS) ×4 IMPLANT
DRSG TELFA 4X3 1S NADH ST (GAUZE/BANDAGES/DRESSINGS) ×4 IMPLANT
ELECT CAUTERY BLADE TIP 2.5 (TIP) ×4
ELECT REM PT RETURN 9FT ADLT (ELECTROSURGICAL) ×4
ELECTRODE CAUTERY BLDE TIP 2.5 (TIP) ×2 IMPLANT
ELECTRODE REM PT RTRN 9FT ADLT (ELECTROSURGICAL) ×2 IMPLANT
GLOVE BIO SURGEON STRL SZ7.5 (GLOVE) ×16 IMPLANT
GLOVE INDICATOR 8.0 STRL GRN (GLOVE) ×16 IMPLANT
GOWN STRL REUS W/ TWL LRG LVL3 (GOWN DISPOSABLE) ×8 IMPLANT
GOWN STRL REUS W/TWL LRG LVL3 (GOWN DISPOSABLE) ×8
KIT PORT POWER 8FR ISP CVUE (Port) ×4 IMPLANT
KIT TURNOVER KIT A (KITS) ×4 IMPLANT
LABEL OR SOLS (LABEL) ×4 IMPLANT
NS IRRIG 500ML POUR BTL (IV SOLUTION) IMPLANT
PACK BASIN MINOR ARMC (MISCELLANEOUS) ×4 IMPLANT
PACK PORT-A-CATH (MISCELLANEOUS) ×4 IMPLANT
PIN SAFETY STRL (MISCELLANEOUS) ×4 IMPLANT
RETRACTOR RING XSMALL (MISCELLANEOUS) ×2 IMPLANT
RTRCTR WOUND ALEXIS 13CM XS SH (MISCELLANEOUS) ×4
SHEARS FOC LG CVD HARMONIC 17C (MISCELLANEOUS) ×4 IMPLANT
SLEVE PROBE SENORX GAMMA FIND (MISCELLANEOUS) ×4 IMPLANT
SPONGE LAP 18X18 RF (DISPOSABLE) ×8 IMPLANT
STRIP CLOSURE SKIN 1/2X4 (GAUZE/BANDAGES/DRESSINGS) ×9 IMPLANT
SUT ETHILON 3-0 FS-10 30 BLK (SUTURE) ×4
SUT PROLENE 3 0 SH DA (SUTURE) ×4 IMPLANT
SUT SILK 2 0 (SUTURE) ×2
SUT SILK 2-0 30XBRD TIE 12 (SUTURE) ×2 IMPLANT
SUT SILK 3 0 (SUTURE) ×2
SUT SILK 3-0 18XBRD TIE 12 (SUTURE) ×2 IMPLANT
SUT VIC AB 2-0 CT1 27 (SUTURE) ×8
SUT VIC AB 2-0 CT1 TAPERPNT 27 (SUTURE) ×8 IMPLANT
SUT VIC AB 3-0 SH 27 (SUTURE) ×2
SUT VIC AB 3-0 SH 27X BRD (SUTURE) ×2 IMPLANT
SUT VIC AB 4-0 FS2 27 (SUTURE) ×4 IMPLANT
SUT VICRYL+ 3-0 144IN (SUTURE) ×4 IMPLANT
SUTURE EHLN 3-0 FS-10 30 BLK (SUTURE) ×2 IMPLANT
SWABSTK COMLB BENZOIN TINCTURE (MISCELLANEOUS) ×4 IMPLANT
SYR 10ML LL (SYRINGE) ×8 IMPLANT
SYR 10ML SLIP (SYRINGE) ×4 IMPLANT
TAPE TRANSPORE STRL 2 31045 (GAUZE/BANDAGES/DRESSINGS) ×4 IMPLANT
WATER STERILE IRR 1000ML POUR (IV SOLUTION) ×4 IMPLANT

## 2018-09-20 NOTE — Anesthesia Post-op Follow-up Note (Signed)
Anesthesia QCDR form completed.        

## 2018-09-20 NOTE — Anesthesia Procedure Notes (Signed)
Procedure Name: LMA Insertion Date/Time: 09/20/2018 7:41 AM Performed by: Jonna Clark, CRNA Pre-anesthesia Checklist: Patient identified, Patient being monitored, Timeout performed, Emergency Drugs available and Suction available Patient Re-evaluated:Patient Re-evaluated prior to induction Oxygen Delivery Method: Circle system utilized Preoxygenation: Pre-oxygenation with 100% oxygen Induction Type: IV induction Ventilation: Mask ventilation without difficulty LMA: LMA inserted LMA Size: 3.5 Tube type: Oral Number of attempts: 1 Placement Confirmation: positive ETCO2 and breath sounds checked- equal and bilateral Tube secured with: Tape Dental Injury: Teeth and Oropharynx as per pre-operative assessment

## 2018-09-20 NOTE — Anesthesia Preprocedure Evaluation (Signed)
Anesthesia Evaluation  Patient identified by MRN, date of birth, ID band Patient awake    Reviewed: Allergy & Precautions, H&P , NPO status , reviewed documented beta blocker date and time   Airway Mallampati: II  TM Distance: >3 FB Neck ROM: full    Dental  (+) Teeth Intact   Pulmonary    Pulmonary exam normal        Cardiovascular hypertension, Normal cardiovascular exam     Neuro/Psych    GI/Hepatic GERD  ,  Endo/Other    Renal/GU      Musculoskeletal  (+) Arthritis ,   Abdominal   Peds  Hematology   Anesthesia Other Findings Past Medical History: No date: Arthritis No date: Breast cancer ([place]) No date: Diverticulitis No date: Family history of breast cancer No date: GERD (gastroesophageal reflux disease) No date: Hyperlipidemia No date: Hypertension Past Surgical History: 05/28/2018: BREAST BIOPSY; Right     Comment:  [place] bx, INVASIVE MAMMARY CARCINOMA WITH LOBULAR FEATURES 06/12/2018: BREAST LUMPECTOMY; Right     Comment:  [facility], lobular features 06/12/2018: BREAST LUMPECTOMY WITH SENTINEL LYMPH NODE BIOPSY; Right     Comment:  Procedure: RIGHT BREAST WIDE EXCISION WITH SENTINEL               LYMPH NODE BX;  Surgeon: Robert Bellow, MD;                Location: [facility] ORS;  Service: General;  Laterality:               Right; No date: CHOLECYSTECTOMY 1997: GALLBLADDER SURGERY No date: JOINT REPLACEMENT No date: ovaraian cyst removal; Right     Comment:  Cyst only (NOT OVARY) 05/05/2010: TOTAL KNEE ARTHROPLASTY; Bilateral   Reproductive/Obstetrics                             Anesthesia Physical Anesthesia Plan  ASA: III  Anesthesia Plan: General   Post-op Pain Management:    Induction: Intravenous  PONV Risk Score and Plan: 4 or greater and Ondansetron, Midazolam and Dexamethasone  Airway Management Planned: LMA and Oral ETT  Additional Equipment:   Intra-op  Plan:   Post-operative Plan:   Informed Consent: I have reviewed the patients History and Physical, chart, labs and discussed the procedure including the risks, benefits and alternatives for the proposed anesthesia with the patient or authorized representative who has indicated his/her understanding and acceptance.     Dental Advisory Given  Plan Discussed with: CRNA  Anesthesia Plan Comments:         Anesthesia Quick Evaluation

## 2018-09-20 NOTE — Anesthesia Postprocedure Evaluation (Signed)
Anesthesia Post Note  Patient: Theresa Reid  Procedure(s) Performed: MASTECTOMY WITH AXILLARY LYMPH NODE DISSECTION RIGHT (Right ) INSERTION PORT-A-CATH, LEFT (Left )  Patient location during evaluation: PACU Anesthesia Type: General Level of consciousness: awake and alert Pain management: pain level controlled Vital Signs Assessment: post-procedure vital signs reviewed and stable Respiratory status: spontaneous breathing, nonlabored ventilation and respiratory function stable Cardiovascular status: blood pressure returned to baseline and stable Postop Assessment: no apparent nausea or vomiting Anesthetic complications: no     Last Vitals:  Vitals:   09/20/18 1147 09/20/18 1238  BP: (!) 124/56 (!) 128/50  Pulse: 74 87  Resp: 16 16  Temp: 36.6 C   SpO2: 96% 96%    Last Pain:  Vitals:   09/20/18 1238  TempSrc:   PainSc: 0-No pain                 Alphonsus Sias

## 2018-09-20 NOTE — OR Nursing (Signed)
Per Dr. Bary Castilla via tele, ok to d/c to home without his visit.

## 2018-09-20 NOTE — Transfer of Care (Signed)
Immediate Anesthesia Transfer of Care Note  Patient: Theresa Reid  Procedure(s) Performed: MASTECTOMY WITH AXILLARY LYMPH NODE DISSECTION RIGHT (Right ) INSERTION PORT-A-CATH, LEFT (Left )  Patient Location: PACU  Anesthesia Type:General  Level of Consciousness: drowsy and patient cooperative  Airway & Oxygen Therapy: Patient Spontanous Breathing and Patient connected to face mask oxygen  Post-op Assessment: Post -op Vital signs reviewed and stable  Post vital signs: Reviewed and stable  Last Vitals:  Vitals Value Taken Time  BP 101/85 09/20/2018 10:26 AM  Temp 36.1 C 09/20/2018 10:26 AM  Pulse 73 09/20/2018 10:28 AM  Resp 17 09/20/2018 10:28 AM  SpO2 95 % 09/20/2018 10:28 AM  Vitals shown include unvalidated device data.  Last Pain:  Vitals:   09/20/18 0642  TempSrc: Temporal  PainSc: 0-No pain         Complications: No apparent anesthesia complications

## 2018-09-20 NOTE — Discharge Instructions (Signed)

## 2018-09-20 NOTE — Op Note (Signed)
Preoperative diagnosis: Metastatic breast cancer.  Need for central venous access.  Postoperative diagnosis: Same.  Operative procedure: Right modified radical mastectomy.  Left subclavian PowerPort placement.  Operating Surgeon: Hervey Ard, MD  Anesthesia: General by LMA, Xylocaine 1%, 10 cc.  Estimated blood loss: 50 cc.  Clinical note: This 78 year old woman was found to have invasive lobular carcinoma and underwent wide local excision and sentinel node biopsy.  The original tumor measured 8 mm on mammogram, 12 mm on ultrasound and 24 mm on resected specimen with a positive medial margin.  Subsequent MRI showed extensive disease extending to the retroareolar area.  One positive node had extracapsular extension and with macro metastatic foci.  In discussion with radiation oncology was elected to proceed to formal axillary dissection.  Given the option for reexcision with the possibility of a new positive margin the patient elected to go to mastectomy.  She is a candidate for adjuvant chemotherapy based on a high MammaPrint recurrence score.  Central venous access was requested by her treating oncologist.  The patient received Ancef on induction of anesthesia.  The breast chest and neck was cleansed with ChloraPrep bilaterally.  The elliptical incision was outlined on the breast as well as the inframammary fold.  The skin was incised sharply and the remaining dissection completed with the photon blade and harmonic scalpel.  The margins of breast excision were the clavicle superiorly, sternum medially, rectus fascia inferiorly and serratus muscle laterally.  The axillary envelope was opened and the axillary vein identified.  The contents inferior to the vein was swept inferior laterally.  The long thoracic nerve of Bell and the thoracodorsal nerve artery and vein bundles were identified and protected.  The intercostal brachial nerve was divided.  The wound was irrigated with sterile water.  A 15  [place] drain was brought out to the inferior medial aspect of the lower flap and anchored in place with 3-0 nylon.  The skin flaps were approximated with a running 2-0 Vicryl suture in 2 segments.  Benzoin Steri-Strips were applied.  Attention was turned toward the PowerPort placement.  The patient was placed in Trendelenburg position.  Patency of the vein was confirmed by ultrasound.  The vein was cannulated under ultrasound guidance followed by passage of the guidewire and dilator.  The catheter was positioned at the junction of the SVC and right atrium making use of fluoroscopy.  It was tunneled to a pocket on the left anterior chest.  The port was anchored to the deep fascia with interrupted 3-0 Prolene sutures x2.  The wound was closed in layers with 3-0 Vicryl to the adipose layer and a running 4-0 Vicryl subcuticular suture.  Benzoin Steri-Strips followed by Telfa and Tegaderm dressings were applied.  Fluff gauze followed by a compressive wrap was applied.    The patient tolerated the procedure well.  Erect portable chest x-ray in the recovery room showed no evidence of pneumothorax and the catheter tip as described above.

## 2018-09-20 NOTE — H&P (Signed)
No change in clinical history or exam. For right mastectomy/ left port placement.

## 2018-09-22 ENCOUNTER — Encounter: Payer: Self-pay | Admitting: General Surgery

## 2018-09-24 ENCOUNTER — Ambulatory Visit (INDEPENDENT_AMBULATORY_CARE_PROVIDER_SITE_OTHER): Payer: Medicare Other | Admitting: *Deleted

## 2018-09-24 ENCOUNTER — Other Ambulatory Visit: Payer: Self-pay

## 2018-09-24 VITALS — BP 130/82 | HR 90 | Temp 97.7°F | Resp 16 | Ht 64.0 in | Wt 191.8 lb

## 2018-09-24 DIAGNOSIS — Z170 Estrogen receptor positive status [ER+]: Secondary | ICD-10-CM

## 2018-09-24 DIAGNOSIS — H269 Unspecified cataract: Secondary | ICD-10-CM | POA: Insufficient documentation

## 2018-09-24 DIAGNOSIS — M353 Polymyalgia rheumatica: Secondary | ICD-10-CM | POA: Insufficient documentation

## 2018-09-24 DIAGNOSIS — C50411 Malignant neoplasm of upper-outer quadrant of right female breast: Secondary | ICD-10-CM

## 2018-09-24 DIAGNOSIS — N3941 Urge incontinence: Secondary | ICD-10-CM | POA: Insufficient documentation

## 2018-09-24 NOTE — Progress Notes (Signed)
Rewrapped patient . Return in one week.

## 2018-09-24 NOTE — Patient Instructions (Signed)
Return in one week.  

## 2018-09-25 ENCOUNTER — Telehealth: Payer: Self-pay | Admitting: General Surgery

## 2018-09-25 LAB — SURGICAL PATHOLOGY

## 2018-09-25 NOTE — Telephone Encounter (Signed)
Patient notified that pathology showed 15/20 nodes positive. Reports doing well.  Drainage volume decreasing.  F/U next week as scheduled.

## 2018-10-01 ENCOUNTER — Encounter: Payer: Self-pay | Admitting: General Surgery

## 2018-10-01 ENCOUNTER — Ambulatory Visit (INDEPENDENT_AMBULATORY_CARE_PROVIDER_SITE_OTHER): Payer: Medicare Other | Admitting: General Surgery

## 2018-10-01 ENCOUNTER — Other Ambulatory Visit: Payer: Self-pay

## 2018-10-01 VITALS — BP 157/81 | HR 99 | Temp 97.7°F | Ht 64.0 in | Wt 191.0 lb

## 2018-10-01 DIAGNOSIS — Z170 Estrogen receptor positive status [ER+]: Secondary | ICD-10-CM

## 2018-10-01 DIAGNOSIS — C50411 Malignant neoplasm of upper-outer quadrant of right female breast: Secondary | ICD-10-CM

## 2018-10-01 NOTE — Progress Notes (Signed)
Patient ID: Theresa Reid, female   DOB: 1941/03/21, 78 y.o.   MRN: [id number]  Chief Complaint  Patient presents with  . Follow-up    HPI Theresa Reid is a 78 y.o. female here today for her follow up right mastectomy done on 09/20/2018.  The patient is accompanied by her daughter, [name]. HPI  Past Medical History:  Diagnosis Date  . Arthritis   . Breast cancer ([place])   . Diverticulitis   . Family history of breast cancer   . GERD (gastroesophageal reflux disease)   . Hyperlipidemia   . Hypertension     Past Surgical History:  Procedure Laterality Date  . BREAST BIOPSY Right 05/28/2018   [place] bx, INVASIVE MAMMARY CARCINOMA WITH LOBULAR FEATURES  . BREAST LUMPECTOMY Right 06/12/2018   [facility], lobular features  . BREAST LUMPECTOMY WITH SENTINEL LYMPH NODE BIOPSY Right 06/12/2018   Procedure: RIGHT BREAST WIDE EXCISION WITH SENTINEL LYMPH NODE BX;  Surgeon: Robert Bellow, MD;  Location: [facility] ORS;  Service: General;  Laterality: Right;  . CHOLECYSTECTOMY    . [place]  . JOINT REPLACEMENT    . MASTECTOMY WITH AXILLARY LYMPH NODE DISSECTION Right 09/20/2018   Procedure: MASTECTOMY WITH AXILLARY LYMPH NODE DISSECTION RIGHT;  Surgeon: Robert Bellow, MD;  Location: [facility] ORS;  Service: General;  Laterality: Right;  . ovaraian cyst removal Right    Cyst only (NOT OVARY)  . PORTACATH PLACEMENT Left 09/20/2018   Procedure: INSERTION PORT-A-CATH, LEFT;  Surgeon: Robert Bellow, MD;  Location: [facility] ORS;  Service: General;  Laterality: Left;  . TOTAL KNEE ARTHROPLASTY Bilateral 05/05/2010    Family History  Problem Relation Age of Onset  . Alcohol abuse Mother        deceased 39  . Diabetes Father   . Breast cancer Maternal Aunt        dx 41s; deceased 57s  . Breast cancer Maternal Aunt        dx 70s; deceased 68s    Social History Social History   Tobacco Use  . Smoking status: Never Smoker  . Smokeless tobacco: Never Used  Substance Use  Topics  . Alcohol use: No    Alcohol/week: 0.0 standard drinks  . Drug use: No    No Known Allergies  Current Outpatient Medications  Medication Sig Dispense Refill  . amLODipine (NORVASC) 5 MG tablet TAKE 1 TABLET BY MOUTH  DAILY (Patient taking differently: Take 5 mg by mouth at bedtime. ) 90 tablet 1  . anastrozole (ARIMIDEX) 1 MG tablet Take 1 tablet (1 mg total) by mouth daily. 30 tablet 3  . atorvastatin (LIPITOR) 80 MG tablet TAKE 1 TABLET BY MOUTH  DAILY (Patient taking differently: Take 80 mg by mouth every evening. ) 90 tablet 1  . CALCIUM-VITAMIN D PO Take 1 tablet by mouth daily.     Marland Kitchen CRANBERRY PO Take 1 tablet by mouth 2 (two) times daily.     . Multiple Vitamins tablet Take 1 tablet by mouth daily.     . Omega-3 Fatty Acids (FISH OIL PO) Take 1 capsule by mouth daily.     Marland Kitchen oxybutynin (DITROPAN) 5 MG tablet TAKE 1 TABLET BY MOUTH TWO  TIMES DAILY (Patient taking differently: Take 5 mg by mouth 2 (two) times daily. ) 180 tablet 1  . pantoprazole (PROTONIX) 40 MG tablet TAKE 1 TABLET BY MOUTH  DAILY (Patient taking differently: Take 40 mg by mouth every morning. ) 90 tablet  1  . potassium chloride (K-DUR,KLOR-CON) 10 MEQ tablet TAKE 1 TABLET BY MOUTH  DAILY (Patient taking differently: Take 10 mEq by mouth daily. ) 90 tablet 1  . torsemide (DEMADEX) 5 MG tablet TAKE 1 TABLET BY MOUTH  DAILY (Patient taking differently: Take 5 mg by mouth daily. ) 90 tablet 1  . Turmeric 500 MG CAPS Take 1,000 mg by mouth 2 (two) times daily.      No current facility-administered medications for this visit.     Review of Systems Review of Systems  Constitutional: Negative.   Respiratory: Negative.   Cardiovascular: Negative.     Blood pressure (!) 157/81, pulse 99, temperature 97.7 F (36.5 C), temperature source Skin, height 5\' 4"  (1.626 m), weight 191 lb (86.6 kg), SpO2 97 %.  Physical Exam Physical Exam Chest:     Shoulder range of motion is 90 degrees bilaterally.  Data  Reviewed DIAGNOSIS:  A. BREAST, RIGHT, WITH AXILLARY CONTENTS; TOTAL MASTECTOMY:  - BENIGN MAMMARY PARENCHYMA WITH FIBROADENOMATOID CHANGES, LOBULAR  ATROPHY AND SCLEROSIS, AND FOCAL ASSOCIATED MICROCALCIFICATIONS.  - FIBROSIS, FAT NECROSIS, AND GIANT CELL REACTION, COMPATIBLE WITH PRIOR  PROCEDURE SITE.  - INCIDENTAL BENIGN LOBULAR CAPILLARY HEMANGIOMA AND FIBROEPITHELIAL  POLYP OF THE SKIN.  - NO EVIDENCE OF RESIDUAL INVASIVE CARCINOMA WITHIN THE BREAST.   - FIFTEEN OF TWENTY LYMPH NODES INVOLVED BY METASTATIC CARCINOMA,  MEASURING UP TO 6 MM IN GREATEST DIMENSION (15/20).  - REMAINING FIVE LYMPH NODES WITH ISOLATED TUMOR CELLS.  pT2 pN3a   Drain sheet reviewed.  Drainage coming down nicely, hip 30 cc/day x 1 in the last 24 hours.  Assessment Doing well post mastectomy and axillary dissection.  Extensive axillary disease identified.  Plan  Role of post chemotherapy radiation to the axilla reviewed.  Patient may drive when she is comfortable and confident in her ability to control the car.   Return in one week. The patient is aware to call back for any questions or concerns.    HPI, Physical Exam, Assessment and Plan have been scribed under the direction and in the presence of Hervey Ard, MD.  Gaspar Cola, CMA  I have completed the exam and reviewed the above documentation for accuracy and completeness.  I agree with the above.  Haematologist has been used and any errors in dictation or transcription are unintentional.  Hervey Ard, M.D., F.A.C.S.  Forest Gleason Araf [place] 10/02/2018, 11:12 AM

## 2018-10-01 NOTE — Patient Instructions (Signed)
Return in one week. The patient is aware to call back for any questions or concerns. 

## 2018-10-04 ENCOUNTER — Inpatient Hospital Stay: Payer: Medicare Other | Attending: Oncology | Admitting: Oncology

## 2018-10-04 ENCOUNTER — Encounter: Payer: Self-pay | Admitting: Oncology

## 2018-10-04 ENCOUNTER — Other Ambulatory Visit: Payer: Self-pay | Admitting: *Deleted

## 2018-10-04 ENCOUNTER — Other Ambulatory Visit: Payer: Self-pay

## 2018-10-04 VITALS — BP 131/74 | HR 86 | Temp 98.3°F | Resp 18 | Ht 64.0 in | Wt 191.3 lb

## 2018-10-04 DIAGNOSIS — Z79899 Other long term (current) drug therapy: Secondary | ICD-10-CM

## 2018-10-04 DIAGNOSIS — Z7189 Other specified counseling: Secondary | ICD-10-CM

## 2018-10-04 DIAGNOSIS — Z170 Estrogen receptor positive status [ER+]: Secondary | ICD-10-CM | POA: Diagnosis not present

## 2018-10-04 DIAGNOSIS — C50411 Malignant neoplasm of upper-outer quadrant of right female breast: Secondary | ICD-10-CM

## 2018-10-04 DIAGNOSIS — Z9011 Acquired absence of right breast and nipple: Secondary | ICD-10-CM | POA: Diagnosis not present

## 2018-10-04 NOTE — Progress Notes (Signed)
Pt here after surrgery to discuss plan about breast cancer

## 2018-10-05 ENCOUNTER — Telehealth: Payer: Self-pay | Admitting: *Deleted

## 2018-10-05 NOTE — Telephone Encounter (Signed)
Called patient per request of Dr. Janese Banks . She states that the patient is going to see dr Bary Castilla next week and her wound is still healing and she still has 1 drain in. She would like to have results of scans that will be done 6/19 before she officially puts in orders and gets the chemo authorized and with that said. She thought it would be best to change appt from [facility] 6/2 to Thursday 6/25 and that will hopefully be enough time for the final plan and authorization for insurance. Patient is agreeable and has wrote the changes down.

## 2018-10-06 NOTE — Progress Notes (Signed)
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  Patient underwent lumpectomy and sentinel lymph node biopsy in February 2020.  Pathology showed invasive lobular carcinoma with positive medial margin which was reexcised with still positive.  Tumor was 24 mm, grade 2 ER PR positive and HER-2/neu negative.  One sentinel lymph node that was excised was positive for macro met of 8 mm.  No extranodal extension was present.  MammaPrint came back as high risk with an average 10-year risk of untreated disease is 29%.  Patient then underwent a bilateral MRI which showed additional suspicious non-mass enhancement along the inferior margin of the biopsy cavity extending 2 cm highly suspicious for continuous disease and another highly suspicious linear non-mass enhancement at the base of the nipple concerning for multifocal multicentric disease.  Patient underwent right mastectomy and axillary lymph node dissection there was no residual invasive mammary carcinoma within the breast.  However 15 out of 20 lymph nodes were involved with metastatic carcinoma measuring up to 6 mm and remaining 5 lymph nodes with isolated  tumor cells.  Pathologic stage was PT2PN3A  Interval history-patient still has a drain in place and will be seeing Dr. Tollie Pizza next week.  She is recovering from her mastectomy well and denies any other complaints at this time.  ECOG PS- 1 Pain scale- 0 Opioid associated constipation- no  Review of systems- Review of Systems  Constitutional: Negative for chills, fever, malaise/fatigue and weight loss.  HENT: Negative for congestion, ear discharge and nosebleeds.   Eyes: Negative for blurred vision.  Respiratory: Negative for cough, hemoptysis, sputum production, shortness of breath and wheezing.   Cardiovascular: Negative for chest pain, palpitations, orthopnea and claudication.  Gastrointestinal: Negative for abdominal pain, blood in stool, constipation, diarrhea, heartburn, melena, nausea and vomiting.  Genitourinary: Negative for dysuria, flank pain, frequency, hematuria and urgency.  Musculoskeletal: Negative for back pain, joint pain and myalgias.  Skin: Negative for rash.  Neurological: Negative for dizziness, tingling, focal weakness, seizures, weakness and headaches.  Endo/Heme/Allergies: Does not bruise/bleed easily.  Psychiatric/Behavioral: Negative for depression and suicidal ideas. The patient does not have insomnia.        No Known Allergies   Past Medical History:  Diagnosis Date   Arthritis    Breast cancer ([place])    Diverticulitis    Family history of breast cancer    GERD (gastroesophageal reflux disease)    Hyperlipidemia    Hypertension      Past Surgical History:  Procedure Laterality Date   BREAST BIOPSY Right 05/28/2018   [place] b[place], INVASIVE MAMMARY CARCINOMA WITH LOBULAR FEATURES   BREAST LUMPECTOMY Right 06/12/2018   [place], lobular features   BREAST LUMPECTOMY WITH SENTINEL LYMPH NODE BIOPSY Right 06/12/2018   Procedure: RIGHT BREAST WIDE  EXCISION WITH SENTINEL LYMPH NODE BX;  Surgeon: Robert Bellow, MD;  Location: [facility] ORS;  Service:  General;  Laterality: Right;   CHOLECYSTECTOMY     GALLBLADDER SURGERY  1997   JOINT REPLACEMENT     MASTECTOMY WITH AXILLARY LYMPH NODE DISSECTION Right 09/20/2018   Procedure: MASTECTOMY WITH AXILLARY LYMPH NODE DISSECTION RIGHT;  Surgeon: Robert Bellow, MD;  Location: [facility] ORS;  Service: General;  Laterality: Right;   ovaraian cyst removal Right    Cyst only (NOT OVARY)   PORTACATH PLACEMENT Left 09/20/2018   Procedure: INSERTION PORT-A-CATH, LEFT;  Surgeon: Robert Bellow, MD;  Location: [facility] ORS;  Service: General;  Laterality: Left;   TOTAL KNEE ARTHROPLASTY Bilateral 05/05/2010    Social History   Socioeconomic History   Marital status: Single    Spouse name: Not on file   Number of children: Not on file   Years of education: Not on file   Highest education level: Not on file  Occupational History   Occupation: retired  Scientist, product/process development strain: Not hard at all   Food insecurity    Worry: Never true    Inability: Never true   Transportation needs    Medical: No    Non-medical: No  Tobacco Use   Smoking status: Never Smoker   Smokeless tobacco: Never Used  Substance and Sexual Activity   Alcohol use: No    Alcohol/week: 0.0 standard drinks   Drug use: No   Sexual activity: Never  Lifestyle   Physical activity    Days per week: Not on file    Minutes per session: Not on file   Stress: Not at all  Relationships   Social connections    Talks on phone: Not on file    Gets together: Not on file    Attends religious service: Not on file    Active member of club or organization: Not on file    Attends meetings of clubs or organizations: Not on file    Relationship status: Not on file   Intimate partner violence    Fear of current or ex partner: Not on file    Emotionally abused: Not on file    Physically abused: Not on file    Forced sexual activity: Not on file  Other Topics Concern   Not on file  Social History  Narrative   Retired    Lives by herself    Pets: None   Caffeine- Coffee 2 cups daily, no tea/soda       Family History  Problem Relation Age of Onset   Alcohol abuse Mother        deceased 21   Diabetes Father    Breast cancer Maternal Aunt        dx 73s; deceased 22s   Breast cancer Maternal Aunt        dx 50s; deceased 66s     Current Outpatient Medications:    amLODipine (NORVASC) 5 MG tablet, TAKE 1 TABLET BY MOUTH  DAILY (Patient taking differently: Take 5 mg by mouth at bedtime. ), Disp: 90 tablet, Rfl: 1   atorvastatin (LIPITOR) 80 MG tablet, TAKE 1 TABLET BY MOUTH  DAILY (Patient taking differently: Take 80 mg by mouth every evening. ), Disp: 90 tablet, Rfl: 1   CALCIUM-VITAMIN D PO, Take 1 tablet by mouth daily. , Disp: , Rfl:    CRANBERRY PO, Take 1 tablet by mouth 2 (two) times daily. , Disp: ,  Rfl:    Multiple Vitamins tablet, Take 1 tablet by mouth daily. , Disp: , Rfl:    Omega-3 Fatty Acids (FISH OIL PO), Take 1 capsule by mouth daily. , Disp: , Rfl:    oxybutynin (DITROPAN) 5 MG tablet, TAKE 1 TABLET BY MOUTH TWO  TIMES DAILY (Patient taking differently: Take 5 mg by mouth 2 (two) times daily. ), Disp: 180 tablet, Rfl: 1   pantoprazole (PROTONIX) 40 MG tablet, TAKE 1 TABLET BY MOUTH  DAILY (Patient taking differently: Take 40 mg by mouth every morning. ), Disp: 90 tablet, Rfl: 1   potassium chloride (K-DUR,KLOR-CON) 10 MEQ tablet, TAKE 1 TABLET BY MOUTH  DAILY (Patient taking differently: Take 10 mEq by mouth daily. ), Disp: 90 tablet, Rfl: 1   torsemide (DEMADEX) 5 MG tablet, TAKE 1 TABLET BY MOUTH  DAILY (Patient taking differently: Take 5 mg by mouth daily. ), Disp: 90 tablet, Rfl: 1   Turmeric 500 MG CAPS, Take 1,000 mg by mouth 2 (two) times daily. , Disp: , Rfl:    anastrozole (ARIMIDEX) 1 MG tablet, Take 1 tablet (1 mg total) by mouth daily. (Patient not taking: Reported on 10/04/2018), Disp: 30 tablet, Rfl: 3  Physical exam:  Vitals:    10/04/18 1014  BP: 131/74  Pulse: 86  Resp: 18  Temp: 98.3 F (36.8 C)  TempSrc: Tympanic  Weight: 191 lb 4.8 oz (86.8 kg)  Height: '5\' 4"'  (1.626 m)   Physical Exam HENT:     Head: Normocephalic and atraumatic.  Eyes:     Pupils: Pupils are equal, round, and reactive to light.  Neck:     Musculoskeletal: Normal range of motion.  Cardiovascular:     Rate and Rhythm: Normal rate and regular rhythm.     Heart sounds: Normal heart sounds.  Pulmonary:     Effort: Pulmonary effort is normal.     Breath sounds: Normal breath sounds.  Abdominal:     General: Bowel sounds are normal.     Palpations: Abdomen is soft.  Skin:    General: Skin is warm and dry.  Neurological:     Mental Status: She is alert and oriented to person, place, and time.   Patient is status post right mastectomy without reconstruction.  There is a surgical drain in place and Steri-Strips still in place.  Wound seems to be healing well. CMP Latest Ref Rng & Units 09/17/2018  Glucose 70 - 99 mg/dL -  BUN 8 - 23 mg/dL -  Creatinine 0.44 - 1.00 mg/dL -  Sodium 135 - 145 mmol/L -  Potassium 3.5 - 5.1 mmol/L 3.9  Chloride 98 - 111 mmol/L -  CO2 22 - 32 mmol/L -  Calcium 8.9 - 10.3 mg/dL -  Total Protein 6.0 - 8.3 g/dL -  Total Bilirubin 0.2 - 1.2 mg/dL -  Alkaline Phos 39 - 117 U/L -  AST 0 - 37 U/L -  ALT 0 - 35 U/L -   CBC Latest Ref Rng & Units 06/12/2018  WBC 4.0 - 10.5 K/uL 8.7  Hemoglobin 12.0 - 15.0 g/dL 13.7  Hematocrit 36.0 - 46.0 % 42.2  Platelets 150 - 400 K/uL 246    No images are attached to the encounter.  Dg Chest Port 1 View  Result Date: 09/20/2018 CLINICAL DATA:  Left port placement. EXAM: PORTABLE CHEST 1 VIEW COMPARISON:  Chest x-ray dated June 23, 2014. FINDINGS: New left chest wall port catheter with tip at the cavoatrial junction. The  heart size and mediastinal contours are within normal limits. Atherosclerotic calcification of the aortic arch. Normal pulmonary vascularity.  Unchanged elevation of the right hemidiaphragm. No focal consolidation, pleural effusion, or pneumothorax. Surgical drain in the right chest wall. No acute osseous abnormality. IMPRESSION: 1. Left chest wall port catheter without complicating feature. No active cardiopulmonary disease. Electronically Signed   By: Titus Dubin M.D.   On: 09/20/2018 10:46   Dg C-arm 1-60 Min-no Report  Result Date: 09/20/2018 Fluoroscopy was utilized by the requesting physician.  No radiographic interpretation.     Assessment and plan- Patient is a 78 y.o. female with invasive mammary carcinoma of the right breast pathological prognostic stage III A pT2pN3a cNX ER PR positive HER-2/neu negative status post right mastectomy and axillary lymph node dissection.  She is here to discuss final pathology results and further management  I discussed the results of final pathology with the patient in detail.  Patient had significantly more disease noted at the time of mastectomy and axillary lymph node dissection which has upstaged her to a stage IIIa.  I would therefore recommend getting CT chest abdomen and pelvis with contrast and a bone scan to rule out any metastatic disease.  If she has evidence of metastatic disease, treatment will be palliative and we could consider CDK kinase inhibitors such as Ibrance and letrozole for her.  If there is no evidence of metastatic disease we will treat her with a curative intent.  Adjuvant chemotherapy would be recommended.  Given the extent of lymph node involvement I would ideally like to give her anthracycline based chemotherapy despite her age given that she does not have any other cardiovascular risk factors.  I would like to get a baseline echocardiogram.  I would recommend 4 cycles of dose dense AC chemotherapy given every 2 weeks for 4 cycles followed by 12 weekly cycles of Taxol.  Discussed risks and benefits of chemotherapy including all but not limited to nausea, vomiting,  fatigue, hair loss, low blood counts, risk of infections and hospitalization.  Risk of cardiotoxicity associated with anthracycline.  Risk of infusion reaction and peripheral neuropathy associated with Taxol.  Treatment will be given with a curative intent.  Patient understands and agrees to proceed.  We will proceed with a CT chest abdomen pelvis and bone scan first.  Patient will also be seen by Dr. Bary Castilla and once she is cleared for willing that point we will start chemotherapy tentatively in 10 days time.  I have explained all this to the patient in person as well as the daughter who was available over the phone today  Cancer Staging Malignant neoplasm of right female breast [facility]) Staging form: Breast, AJCC 8th Edition - Clinical stage from 06/04/2018: Stage IA (cT1c, cN0, cM0, G1, ER+, PR+, HER2-) - Signed by Sindy Guadeloupe, MD on 06/07/2018 - Pathologic stage from 07/04/2018: Stage IB (pT2, pN1a, cM0, G2, ER+, PR+, HER2-) - Signed by Sindy Guadeloupe, MD on 07/04/2018 - Pathologic stage from 10/04/2018: Stage IIIA (pT2, pN3a, cM0, G2, ER+, PR+, HER2-) - Signed by Sindy Guadeloupe, MD on 10/06/2018   Total face to face encounter time for this patient visit was 40 min. >50% of the time was  spent in counseling and coordination of care.     Visit Diagnosis 1. Malignant neoplasm of upper-outer quadrant of right breast in female, estrogen receptor positive ([place])   2. Goals of care, counseling/discussion      Dr. Randa Evens, MD, MPH [facility]  at [facility] [phone] 10/06/2018 6:40 PM

## 2018-10-08 ENCOUNTER — Encounter: Payer: Self-pay | Admitting: General Surgery

## 2018-10-08 ENCOUNTER — Ambulatory Visit (INDEPENDENT_AMBULATORY_CARE_PROVIDER_SITE_OTHER): Payer: Medicare Other | Admitting: General Surgery

## 2018-10-08 ENCOUNTER — Other Ambulatory Visit: Payer: Self-pay

## 2018-10-08 VITALS — BP 136/78 | HR 101 | Temp 97.7°F | Ht 66.0 in | Wt 189.0 lb

## 2018-10-08 DIAGNOSIS — C50411 Malignant neoplasm of upper-outer quadrant of right female breast: Secondary | ICD-10-CM | POA: Diagnosis not present

## 2018-10-08 DIAGNOSIS — T86822 Skin graft (allograft) (autograft) infection: Secondary | ICD-10-CM

## 2018-10-08 DIAGNOSIS — Z170 Estrogen receptor positive status [ER+]: Secondary | ICD-10-CM | POA: Diagnosis not present

## 2018-10-08 MED ORDER — CEFADROXIL 500 MG PO CAPS: 500.0000 mg | ORAL_CAPSULE | Freq: Two times a day (BID) | ORAL | 0 refills | Status: DC

## 2018-10-08 NOTE — Progress Notes (Signed)
Patient ID: Theresa Reid, female   DOB: 23-May-1940, 78 y.o.   MRN: [id number]  Chief Complaint  Patient presents with  . Follow-up    HPI Theresa Reid is a 78 y.o. female here today for her follow up right mastectomy done on 09/20/2018. Patient states she is doing well.  HPI  Past Medical History:  Diagnosis Date  . Arthritis   . Breast cancer ([place])   . Diverticulitis   . Family history of breast cancer   . GERD (gastroesophageal reflux disease)   . Hyperlipidemia   . Hypertension     Past Surgical History:  Procedure Laterality Date  . BREAST BIOPSY Right 05/28/2018   [place] bx, INVASIVE MAMMARY CARCINOMA WITH LOBULAR FEATURES  . BREAST LUMPECTOMY Right 06/12/2018   [facility], lobular features  . BREAST LUMPECTOMY WITH SENTINEL LYMPH NODE BIOPSY Right 06/12/2018   Procedure: RIGHT BREAST WIDE EXCISION WITH SENTINEL LYMPH NODE BX;  Surgeon: Robert Bellow, MD;  Location: [facility] OR[facility];  Service: General;  Laterality: Right;  . CHOLECYSTECTOMY    . [place]  . JOINT REPLACEMENT    . MASTECTOMY WITH AXILLARY LYMPH NODE DISSECTION Right 09/20/2018   Procedure: MASTECTOMY WITH AXILLARY LYMPH NODE DISSECTION RIGHT;  Surgeon: Robert Bellow, MD;  Location: [facility] OR[facility];  Service: General;  Laterality: Right;  . ovaraian cyst removal Right    Cyst only (NOT OVARY)  . PORTACATH PLACEMENT Left 09/20/2018   Procedure: INSERTION PORT-A-CATH, LEFT;  Surgeon: Robert Bellow, MD;  Location: [facility] OR[facility];  Service: General;  Laterality: Left;  . TOTAL KNEE ARTHROPLASTY Bilateral 05/05/2010    Family History  Problem Relation Age of Onset  . Alcohol abuse Mother        deceased 15  . Diabetes Father   . Breast cancer Maternal Aunt        dx 28s; deceased 40s  . Breast cancer Maternal Aunt        dx 62s; deceased 88s    Social History Social History   Tobacco Use  . Smoking status: Never Smoker  . Smokeless tobacco: Never Used  Substance Use Topics  . Alcohol use: No     Alcohol/week: 0.0 standard drinks  . Drug use: No    No Known Allergies  Current Outpatient Medications  Medication Sig Dispense Refill  . amLODipine (NORVASC) 5 MG tablet TAKE 1 TABLET BY MOUTH  DAILY (Patient taking differently: Take 5 mg by mouth at bedtime. ) 90 tablet 1  . atorvastatin (LIPITOR) 80 MG tablet TAKE 1 TABLET BY MOUTH  DAILY (Patient taking differently: Take 80 mg by mouth every evening. ) 90 tablet 1  . CALCIUM-VITAMIN D PO Take 1 tablet by mouth daily.     Marland Kitchen CRANBERRY PO Take 1 tablet by mouth 2 (two) times daily.     . Multiple Vitamins tablet Take 1 tablet by mouth daily.     . Omega-3 Fatty Acids (FISH OIL PO) Take 1 capsule by mouth daily.     Marland Kitchen oxybutynin (DITROPAN) 5 MG tablet TAKE 1 TABLET BY MOUTH TWO  TIMES DAILY (Patient taking differently: Take 5 mg by mouth 2 (two) times daily. ) 180 tablet 1  . pantoprazole (PROTONIX) 40 MG tablet TAKE 1 TABLET BY MOUTH  DAILY (Patient taking differently: Take 40 mg by mouth every morning. ) 90 tablet 1  . potassium chloride (K-DUR,KLOR-CON) 10 MEQ tablet TAKE 1 TABLET BY MOUTH  DAILY (Patient taking differently: Take 10 mEq  by mouth daily. ) 90 tablet 1  . torsemide (DEMADEX) 5 MG tablet TAKE 1 TABLET BY MOUTH  DAILY (Patient taking differently: Take 5 mg by mouth daily. ) 90 tablet 1  . Turmeric 500 MG CAPS Take 1,000 mg by mouth 2 (two) times daily.     Marland Kitchen anastrozole (ARIMIDEX) 1 MG tablet Take 1 tablet (1 mg total) by mouth daily. (Patient not taking: Reported on 10/04/2018) 30 tablet 3  . cefadroxil (DURICEF) 500 MG capsule Take 1 capsule (500 mg total) by mouth 2 (two) times daily. 20 capsule 0   No current facility-administered medications for this visit.     Review of Systems Review of Systems  Constitutional: Negative.   Respiratory: Negative.   Cardiovascular: Negative.     Blood pressure 136/78, pulse (!) 101, temperature 97.7 F (36.5 C), temperature source Skin, height 5\' 6"  (1.676 m), weight 189 lb  (85.7 kg), SpO2 95 %.  Physical Exam Physical Exam Exam conducted with a chaperone present.  Constitutional:      Appearance: Normal appearance.  Chest:    Skin:    General: Skin is warm and dry.  Neurological:     General: No focal deficit present.     Mental Status: She is oriented to person, place, and time.        Drain removed today.   Data Reviewed Culture obtained.  Assessment Focal flap inflammation.  Plan The patient will be placed on Duricef 500 mg p.o. twice daily.  Dressing changes reviewed.  Anticipated adjuvant chemotherapy will likely need to be postponed at least 2 weeks.  The patient and her daughter were encouraged to call if the erythema extended outside the marked rim noted today.  Return in one week.The patient is aware to call back for any questions or concerns. Rx sent in.   HPI, Physical Exam, Assessment and Plan have been scribed under the direction and in the presence of Hervey Ard, MD.  Gaspar Cola, CMA  I have completed the exam and reviewed the above documentation for accuracy and completeness.  I agree with the above.  Haematologist has been used and any errors in dictation or transcription are unintentional.  Hervey Ard, M.D., F.A.C.S.   Theresa Reid 10/09/2018, 2:46 PM

## 2018-10-08 NOTE — Patient Instructions (Signed)
Return in one week.The patient is aware to call back for any questions or concerns. Rx sent in.

## 2018-10-08 NOTE — Patient Instructions (Signed)
Doxorubicin injection What is this medicine? DOXORUBICIN (dox oh ROO bi sin) is a chemotherapy drug. It is used to treat many kinds of cancer like leukemia, lymphoma, neuroblastoma, sarcoma, and Wilms' tumor. It is also used to treat bladder cancer, breast cancer, lung cancer, ovarian cancer, stomach cancer, and thyroid cancer. This medicine may be used for other purposes; ask your health care provider or pharmacist if you have questions. COMMON BRAND NAME(S): Adriamycin, Adriamycin PFS, Adriamycin RDF, Rubex What should I tell my health care provider before I take this medicine? They need to know if you have any of these conditions: -heart disease -history of low blood counts caused by a medicine -liver disease -recent or ongoing radiation therapy -an unusual or allergic reaction to doxorubicin, other chemotherapy agents, other medicines, foods, dyes, or preservatives -pregnant or trying to get pregnant -breast-feeding How should I use this medicine? This drug is given as an infusion into a vein. It is administered in a hospital or clinic by a specially trained health care professional. If you have pain, swelling, burning or any unusual feeling around the site of your injection, tell your health care professional right away. Talk to your pediatrician regarding the use of this medicine in children. Special care may be needed. Overdosage: If you think you have taken too much of this medicine contact a poison control center or emergency room at once. NOTE: This medicine is only for you. Do not share this medicine with others. What if I miss a dose? It is important not to miss your dose. Call your doctor or health care professional if you are unable to keep an appointment. What may interact with this medicine? This medicine may interact with the following medications: -6-mercaptopurine -paclitaxel -phenytoin -St. John's Wort -trastuzumab -verapamil This list may not describe all possible  interactions. Give your health care provider a list of all the medicines, herbs, non-prescription drugs, or dietary supplements you use. Also tell them if you smoke, drink alcohol, or use illegal drugs. Some items may interact with your medicine. What should I watch for while using this medicine? This drug may make you feel generally unwell. This is not uncommon, as chemotherapy can affect healthy cells as well as cancer cells. Report any side effects. Continue your course of treatment even though you feel ill unless your doctor tells you to stop. There is a maximum amount of this medicine you should receive throughout your life. The amount depends on the medical condition being treated and your overall health. Your doctor will watch how much of this medicine you receive in your lifetime. Tell your doctor if you have taken this medicine before. You may need blood work done while you are taking this medicine. Your urine may turn red for a few days after your dose. This is not blood. If your urine is dark or brown, call your doctor. In some cases, you may be given additional medicines to help with side effects. Follow all directions for their use. Call your doctor or health care professional for advice if you get a fever, chills or sore throat, or other symptoms of a cold or flu. Do not treat yourself. This drug decreases your body's ability to fight infections. Try to avoid being around people who are sick. This medicine may increase your risk to bruise or bleed. Call your doctor or health care professional if you notice any unusual bleeding. Talk to your doctor about your risk of cancer. You may be more at risk for certain   types of cancers if you take this medicine. Do not become pregnant while taking this medicine or for 6 months after stopping it. Women should inform their doctor if they wish to become pregnant or think they might be pregnant. Men should not father a child while taking this medicine and  for 6 months after stopping it. There is a potential for serious side effects to an unborn child. Talk to your health care professional or pharmacist for more information. Do not breast-feed an infant while taking this medicine. This medicine has caused ovarian failure in some women and reduced sperm counts in some men This medicine may interfere with the ability to have a child. Talk with your doctor or health care professional if you are concerned about your fertility. This medicine may cause a decrease in Co-Enzyme Q-10. You should make sure that you get enough Co-Enzyme Q-10 while you are taking this medicine. Discuss the foods you eat and the vitamins you take with your health care professional. What side effects may I notice from receiving this medicine? Side effects that you should report to your doctor or health care professional as soon as possible: -allergic reactions like skin rash, itching or hives, swelling of the face, lips, or tongue -breathing problems -chest pain -fast or irregular heartbeat -low blood counts - this medicine may decrease the number of white blood cells, red blood cells and platelets. You may be at increased risk for infections and bleeding. -pain, redness, or irritation at site where injected -signs of infection - fever or chills, cough, sore throat, pain or difficulty passing urine -signs of decreased platelets or bleeding - bruising, pinpoint red spots on the skin, black, tarry stools, blood in the urine -swelling of the ankles, feet, hands -tiredness -weakness Side effects that usually do not require medical attention (report to your doctor or health care professional if they continue or are bothersome): -diarrhea -hair loss -mouth sores -nail discoloration or damage -nausea -red colored urine -vomiting This list may not describe all possible side effects. Call your doctor for medical advice about side effects. You may report side effects to FDA at  1-800-FDA-1088. Where should I keep my medicine? This drug is given in a hospital or clinic and will not be stored at home. NOTE: This sheet is a summary. It may not cover all possible information. If you have questions about this medicine, talk to your doctor, pharmacist, or health care provider.  2019 [employer] Standard (2016-11-22 11:01:26) Cyclophosphamide injection What is this medicine? CYCLOPHOSPHAMIDE (sye kloe FOSS fa mide) is a chemotherapy drug. It slows the growth of cancer cells. This medicine is used to treat many types of cancer like lymphoma, myeloma, leukemia, breast cancer, and ovarian cancer, to name a few. This medicine may be used for other purposes; ask your health care provider or pharmacist if you have questions. COMMON BRAND NAME(S): Cytoxan, Neosar What should I tell my health care provider before I take this medicine? They need to know if you have any of these conditions: -blood disorders -history of other chemotherapy -infection -kidney disease -liver disease -recent or ongoing radiation therapy -tumors in the bone marrow -an unusual or allergic reaction to cyclophosphamide, other chemotherapy, other medicines, foods, dyes, or preservatives -pregnant or trying to get pregnant -breast-feeding How should I use this medicine? This drug is usually given as an injection into a vein or muscle or by infusion into a vein. It is administered in a hospital or clinic by a specially trained health  care professional. Talk to your pediatrician regarding the use of this medicine in children. Special care may be needed. Overdosage: If you think you have taken too much of this medicine contact a poison control center or emergency room at once. NOTE: This medicine is only for you. Do not share this medicine with others. What if I miss a dose? It is important not to miss your dose. Call your doctor or health care professional if you are unable to keep an appointment. What  may interact with this medicine? This medicine may interact with the following medications: -amiodarone -amphotericin B -azathioprine -certain antiviral medicines for HIV or AIDS such as protease inhibitors (e.g., indinavir, ritonavir) and zidovudine -certain blood pressure medications such as benazepril, captopril, enalapril, fosinopril, lisinopril, moexipril, monopril, perindopril, quinapril, ramipril, trandolapril -certain cancer medications such as anthracyclines (e.g., daunorubicin, doxorubicin), busulfan, cytarabine, paclitaxel, pentostatin, tamoxifen, trastuzumab -certain diuretics such as chlorothiazide, chlorthalidone, hydrochlorothiazide, indapamide, metolazone -certain medicines that treat or prevent blood clots like warfarin -certain muscle relaxants such as succinylcholine -cyclosporine -etanercept -indomethacin -medicines to increase blood counts like filgrastim, pegfilgrastim, sargramostim -medicines used as general anesthesia -metronidazole -natalizumab This list may not describe all possible interactions. Give your health care provider a list of all the medicines, herbs, non-prescription drugs, or dietary supplements you use. Also tell them if you smoke, drink alcohol, or use illegal drugs. Some items may interact with your medicine. What should I watch for while using this medicine? Visit your doctor for checks on your progress. This drug may make you feel generally unwell. This is not uncommon, as chemotherapy can affect healthy cells as well as cancer cells. Report any side effects. Continue your course of treatment even though you feel ill unless your doctor tells you to stop. Drink water or other fluids as directed. Urinate often, even at night. In some cases, you may be given additional medicines to help with side effects. Follow all directions for their use. Call your doctor or health care professional for advice if you get a fever, chills or sore throat, or other  symptoms of a cold or flu. Do not treat yourself. This drug decreases your body's ability to fight infections. Try to avoid being around people who are sick. This medicine may increase your risk to bruise or bleed. Call your doctor or health care professional if you notice any unusual bleeding. Be careful brushing and flossing your teeth or using a toothpick because you may get an infection or bleed more easily. If you have any dental work done, tell your dentist you are receiving this medicine. You may get drowsy or dizzy. Do not drive, use machinery, or do anything that needs mental alertness until you know how this medicine affects you. Do not become pregnant while taking this medicine or for 1 year after stopping it. Women should inform their doctor if they wish to become pregnant or think they might be pregnant. Men should not father a child while taking this medicine and for 4 months after stopping it. There is a potential for serious side effects to an unborn child. Talk to your health care professional or pharmacist for more information. Do not breast-feed an infant while taking this medicine. This medicine may interfere with the ability to have a child. This medicine has caused ovarian failure in some women. This medicine has caused reduced sperm counts in some men. You should talk with your doctor or health care professional if you are concerned about your fertility. If you are going  to have surgery, tell your doctor or health care professional that you have taken this medicine. What side effects may I notice from receiving this medicine? Side effects that you should report to your doctor or health care professional as soon as possible: -allergic reactions like skin rash, itching or hives, swelling of the face, lips, or tongue -low blood counts - this medicine may decrease the number of white blood cells, red blood cells and platelets. You may be at increased risk for infections and  bleeding. -signs of infection - fever or chills, cough, sore throat, pain or difficulty passing urine -signs of decreased platelets or bleeding - bruising, pinpoint red spots on the skin, black, tarry stools, blood in the urine -signs of decreased red blood cells - unusually weak or tired, fainting spells, lightheadedness -breathing problems -dark urine -dizziness -palpitations -swelling of the ankles, feet, hands -trouble passing urine or change in the amount of urine -weight gain -yellowing of the eyes or skin Side effects that usually do not require medical attention (report to your doctor or health care professional if they continue or are bothersome): -changes in nail or skin color -hair loss -missed menstrual periods -mouth sores -nausea, vomiting This list may not describe all possible side effects. Call your doctor for medical advice about side effects. You may report side effects to FDA at 1-800-FDA-1088. Where should I keep my medicine? This drug is given in a hospital or clinic and will not be stored at home. NOTE: This sheet is a summary. It may not cover all possible information. If you have questions about this medicine, talk to your doctor, pharmacist, or health care provider.  2019 [employer]/Gold Standard (2012-02-23 16:22:58) Paclitaxel injection What is this medicine? PACLITAXEL (PAK li TAX el) is a chemotherapy drug. It targets fast dividing cells, like cancer cells, and causes these cells to die. This medicine is used to treat ovarian cancer, breast cancer, lung cancer, Kaposi's sarcoma, and other cancers. This medicine may be used for other purposes; ask your health care provider or pharmacist if you have questions. COMMON BRAND NAME(S): Onxol, Taxol What should I tell my health care provider before I take this medicine? They need to know if you have any of these conditions: -history of irregular heartbeat -liver disease -low blood counts, like low white cell,  platelet, or red cell counts -lung or breathing disease, like asthma -tingling of the fingers or toes, or other nerve disorder -an unusual or allergic reaction to paclitaxel, alcohol, polyoxyethylated castor oil, other chemotherapy, other medicines, foods, dyes, or preservatives -pregnant or trying to get pregnant -breast-feeding How should I use this medicine? This drug is given as an infusion into a vein. It is administered in a hospital or clinic by a specially trained health care professional. Talk to your pediatrician regarding the use of this medicine in children. Special care may be needed. Overdosage: If you think you have taken too much of this medicine contact a poison control center or emergency room at once. NOTE: This medicine is only for you. Do not share this medicine with others. What if I miss a dose? It is important not to miss your dose. Call your doctor or health care professional if you are unable to keep an appointment. What may interact with this medicine? Do not take this medicine with any of the following medications: -disulfiram -metronidazole This medicine may also interact with the following medications: -antiviral medicines for hepatitis, HIV or AIDS -certain antibiotics like erythromycin and clarithromycin -certain  medicines for fungal infections like ketoconazole and itraconazole -certain medicines for seizures like carbamazepine, phenobarbital, phenytoin -gemfibrozil -nefazodone -rifampin -St. John's wort This list may not describe all possible interactions. Give your health care provider a list of all the medicines, herbs, non-prescription drugs, or dietary supplements you use. Also tell them if you smoke, drink alcohol, or use illegal drugs. Some items may interact with your medicine. What should I watch for while using this medicine? Your condition will be monitored carefully while you are receiving this medicine. You will need important blood work done  while you are taking this medicine. This medicine can cause serious allergic reactions. To reduce your risk you will need to take other medicine(s) before treatment with this medicine. If you experience allergic reactions like skin rash, itching or hives, swelling of the face, lips, or tongue, tell your doctor or health care professional right away. In some cases, you may be given additional medicines to help with side effects. Follow all directions for their use. This drug may make you feel generally unwell. This is not uncommon, as chemotherapy can affect healthy cells as well as cancer cells. Report any side effects. Continue your course of treatment even though you feel ill unless your doctor tells you to stop. Call your doctor or health care professional for advice if you get a fever, chills or sore throat, or other symptoms of a cold or flu. Do not treat yourself. This drug decreases your body's ability to fight infections. Try to avoid being around people who are sick. This medicine may increase your risk to bruise or bleed. Call your doctor or health care professional if you notice any unusual bleeding. Be careful brushing and flossing your teeth or using a toothpick because you may get an infection or bleed more easily. If you have any dental work done, tell your dentist you are receiving this medicine. Avoid taking products that contain aspirin, acetaminophen, ibuprofen, naproxen, or ketoprofen unless instructed by your doctor. These medicines may hide a fever. Do not become pregnant while taking this medicine. Women should inform their doctor if they wish to become pregnant or think they might be pregnant. There is a potential for serious side effects to an unborn child. Talk to your health care professional or pharmacist for more information. Do not breast-feed an infant while taking this medicine. Men are advised not to father a child while receiving this medicine. This product may contain  alcohol. Ask your pharmacist or healthcare provider if this medicine contains alcohol. Be sure to tell all healthcare providers you are taking this medicine. Certain medicines, like metronidazole and disulfiram, can cause an unpleasant reaction when taken with alcohol. The reaction includes flushing, headache, nausea, vomiting, sweating, and increased thirst. The reaction can last from 30 minutes to several hours. What side effects may I notice from receiving this medicine? Side effects that you should report to your doctor or health care professional as soon as possible: -allergic reactions like skin rash, itching or hives, swelling of the face, lips, or tongue -breathing problems -changes in vision -fast, irregular heartbeat -high or low blood pressure -mouth sores -pain, tingling, numbness in the hands or feet -signs of decreased platelets or bleeding - bruising, pinpoint red spots on the skin, black, tarry stools, blood in the urine -signs of decreased red blood cells - unusually weak or tired, feeling faint or lightheaded, falls -signs of infection - fever or chills, cough, sore throat, pain or difficulty passing urine -signs and symptoms  of liver injury like dark yellow or brown urine; general ill feeling or flu-like symptoms; light-colored stools; loss of appetite; nausea; right upper belly pain; unusually weak or tired; yellowing of the eyes or skin -swelling of the ankles, feet, hands -unusually slow heartbeat Side effects that usually do not require medical attention (report to your doctor or health care professional if they continue or are bothersome): -diarrhea -hair loss -loss of appetite -muscle or joint pain -nausea, vomiting -pain, redness, or irritation at site where injected -tiredness This list may not describe all possible side effects. Call your doctor for medical advice about side effects. You may report side effects to FDA at 1-800-FDA-1088. Where should I keep my  medicine? This drug is given in a hospital or clinic and will not be stored at home. NOTE: This sheet is a summary. It may not cover all possible information. If you have questions about this medicine, talk to your doctor, pharmacist, or health care provider.  2019 [employer] Standard (2016-12-12 13:14:55)

## 2018-10-09 ENCOUNTER — Telehealth: Payer: Self-pay | Admitting: *Deleted

## 2018-10-09 ENCOUNTER — Telehealth: Payer: Self-pay | Admitting: Oncology

## 2018-10-09 DIAGNOSIS — T86822 Skin graft (allograft) (autograft) infection: Secondary | ICD-10-CM | POA: Insufficient documentation

## 2018-10-09 NOTE — Telephone Encounter (Signed)
Patient called and stated that she saw Dr.Byrnett yesterday 10/08/18 and he removed the drain, she wants to know since the drain is removed how much drainage is normal. Please call and advise

## 2018-10-09 NOTE — Telephone Encounter (Signed)
no answer no vm, system msg stated wireless customer unavailable to take my call at this time

## 2018-10-09 NOTE — Telephone Encounter (Signed)
Patient states that she had a lot of drainage yesterday evening and then again first thing this morning. She has had very little since then. Was notified that this was normal as the body sometimes collects fluid when lying down and then it may come out all at once when up and around. She is aware to watch out for any change in drainage color, odor, chills, fever, or increase in pain or redness.

## 2018-10-10 ENCOUNTER — Inpatient Hospital Stay (HOSPITAL_BASED_OUTPATIENT_CLINIC_OR_DEPARTMENT_OTHER): Payer: Medicare Other | Admitting: Oncology

## 2018-10-10 ENCOUNTER — Other Ambulatory Visit: Payer: Self-pay

## 2018-10-10 ENCOUNTER — Telehealth: Payer: Self-pay | Admitting: *Deleted

## 2018-10-10 ENCOUNTER — Inpatient Hospital Stay: Payer: Medicare Other

## 2018-10-10 DIAGNOSIS — C50911 Malignant neoplasm of unspecified site of right female breast: Secondary | ICD-10-CM

## 2018-10-10 DIAGNOSIS — C50411 Malignant neoplasm of upper-outer quadrant of right female breast: Secondary | ICD-10-CM | POA: Diagnosis not present

## 2018-10-10 DIAGNOSIS — Z79899 Other long term (current) drug therapy: Secondary | ICD-10-CM

## 2018-10-10 DIAGNOSIS — Z170 Estrogen receptor positive status [ER+]: Secondary | ICD-10-CM | POA: Diagnosis not present

## 2018-10-10 DIAGNOSIS — Z9011 Acquired absence of right breast and nipple: Secondary | ICD-10-CM

## 2018-10-10 MED ORDER — LIDOCAINE-PRILOCAINE 2.5-2.5 % EX CREA: TOPICAL_CREAM | CUTANEOUS | 3 refills | Status: DC

## 2018-10-10 MED ORDER — ONDANSETRON HCL 8 MG PO TABS: 8.0000 mg | ORAL_TABLET | Freq: Two times a day (BID) | ORAL | 1 refills | Status: DC | PRN

## 2018-10-10 MED ORDER — PROCHLORPERAZINE MALEATE 10 MG PO TABS: 10.0000 mg | ORAL_TABLET | Freq: Four times a day (QID) | ORAL | 1 refills | Status: DC | PRN

## 2018-10-10 NOTE — Telephone Encounter (Signed)
Patient states that when she washed the area off and left her dressing off for a little while the area looked much better.

## 2018-10-10 NOTE — Telephone Encounter (Signed)
Patient called and said she no longer needed the nurse to give her a call back  And said everything looked to be healing fine.

## 2018-10-10 NOTE — Telephone Encounter (Signed)
Patient called and stated that her incision is getting worse instead of healing. She stated its draining and the incision is white and sticky. Please call and advise

## 2018-10-10 NOTE — Telephone Encounter (Signed)
Tried calling the patient today but she does not have voicemail. Called her daughter to let her know that we are changing the appt to start chemo since pt. Needing to have more time for wound healing from surgery. The daughter wrote the new date and I told her that if it needs to be moved again we can and dr byrnett will update [place]

## 2018-10-11 ENCOUNTER — Ambulatory Visit
Admission: RE | Admit: 2018-10-11 | Discharge: 2018-10-11 | Disposition: A | Discharge status: Home / Self Care | Payer: Medicare Other | Source: Ambulatory Visit | Attending: Oncology | Admitting: Oncology

## 2018-10-11 ENCOUNTER — Encounter
Admission: RE | Admit: 2018-10-11 | Discharge: 2018-10-11 | Disposition: A | Discharge status: Home / Self Care | Payer: Medicare Other | Source: Ambulatory Visit | Attending: Oncology | Admitting: Oncology

## 2018-10-11 DIAGNOSIS — C50411 Malignant neoplasm of upper-outer quadrant of right female breast: Secondary | ICD-10-CM | POA: Insufficient documentation

## 2018-10-11 DIAGNOSIS — Z170 Estrogen receptor positive status [ER+]: Secondary | ICD-10-CM

## 2018-10-11 DIAGNOSIS — I700 Atherosclerosis of aorta: Secondary | ICD-10-CM | POA: Diagnosis not present

## 2018-10-11 DIAGNOSIS — K7689 Other specified diseases of liver: Secondary | ICD-10-CM | POA: Diagnosis not present

## 2018-10-11 DIAGNOSIS — N289 Disorder of kidney and ureter, unspecified: Secondary | ICD-10-CM | POA: Diagnosis not present

## 2018-10-11 DIAGNOSIS — R948 Abnormal results of function studies of other organs and systems: Secondary | ICD-10-CM | POA: Diagnosis not present

## 2018-10-11 DIAGNOSIS — J439 Emphysema, unspecified: Secondary | ICD-10-CM | POA: Diagnosis not present

## 2018-10-11 LAB — POCT I-STAT CREATININE: Creatinine, Ser: 0.3 mg/dL — ABNORMAL LOW (ref 0.44–1.00)

## 2018-10-11 MED ORDER — TECHNETIUM TC 99M MEDRONATE IV KIT
20.0000 | PACK | Freq: Once | INTRAVENOUS | Status: AC | PRN
  Administered 2018-10-11: 23.334 via INTRAVENOUS

## 2018-10-11 MED ORDER — IOHEXOL 300 MG/ML  SOLN
100.0000 mL | Freq: Once | INTRAMUSCULAR | Status: AC | PRN
  Administered 2018-10-11: 100 mL via INTRAVENOUS

## 2018-10-12 LAB — ANAEROBIC AND AEROBIC CULTURE

## 2018-10-14 ENCOUNTER — Ambulatory Visit: Payer: Medicare Other | Admitting: Oncology

## 2018-10-14 ENCOUNTER — Ambulatory Visit: Payer: Medicare Other

## 2018-10-14 ENCOUNTER — Other Ambulatory Visit: Payer: Medicare Other

## 2018-10-15 ENCOUNTER — Ambulatory Visit (INDEPENDENT_AMBULATORY_CARE_PROVIDER_SITE_OTHER): Payer: Medicare Other | Admitting: General Surgery

## 2018-10-15 ENCOUNTER — Other Ambulatory Visit: Payer: Self-pay

## 2018-10-15 ENCOUNTER — Encounter: Payer: Self-pay | Admitting: General Surgery

## 2018-10-15 VITALS — BP 140/80 | HR 74 | Temp 97.7°F | Ht 64.0 in | Wt 189.0 lb

## 2018-10-15 DIAGNOSIS — T86822 Skin graft (allograft) (autograft) infection: Secondary | ICD-10-CM

## 2018-10-15 MED ORDER — SULFAMETHOXAZOLE-TRIMETHOPRIM 800-160 MG PO TABS: 1.0000 | ORAL_TABLET | Freq: Two times a day (BID) | ORAL | 0 refills | Status: DC

## 2018-10-15 NOTE — Patient Instructions (Addendum)
Return in two days.  Clean daily. Rx sent.

## 2018-10-15 NOTE — Progress Notes (Signed)
Patient ID: Theresa Reid, female   DOB: Apr 17, 1941, 78 y.o.   MRN: [id number]  Chief Complaint  Patient presents with  . Follow-up    HPI Theresa Reid is a 78 y.o. female here today for her follow up right mastectomy done on 09/20/2018. Patient states the area is draining.  The patient is aware of air moving under the flap when she brings her arm above her head.  Past Medical History:  Diagnosis Date  . Arthritis   . Breast cancer ([place])   . Diverticulitis   . Family history of breast cancer   . GERD (gastroesophageal reflux disease)   . Hyperlipidemia   . Hypertension     Past Surgical History:  Procedure Laterality Date  . BREAST BIOPSY Right 05/28/2018   [place] b[place], INVASIVE MAMMARY CARCINOMA WITH LOBULAR FEATURES  . BREAST LUMPECTOMY Right 06/12/2018   [facility], lobular features  . BREAST LUMPECTOMY WITH SENTINEL LYMPH NODE BIOPSY Right 06/12/2018   Procedure: RIGHT BREAST WIDE EXCISION WITH SENTINEL LYMPH NODE BX;  Surgeon: Robert Bellow, MD;  Location: [facility] OR[facility];  Service: General;  Laterality: Right;  . CHOLECYSTECTOMY    . [place]  . JOINT REPLACEMENT    . MASTECTOMY WITH AXILLARY LYMPH NODE DISSECTION Right 09/20/2018   Procedure: MASTECTOMY WITH AXILLARY LYMPH NODE DISSECTION RIGHT;  Surgeon: Robert Bellow, MD;  Location: [facility];  Service: General;  Laterality: Right;  . ovaraian cyst removal Right    Cyst only (NOT OVARY)  . PORTACATH PLACEMENT Left 09/20/2018   Procedure: INSERTION PORT-A-CATH, LEFT;  Surgeon: Robert Bellow, MD;  Location: [facility];  Service: General;  Laterality: Left;  . TOTAL KNEE ARTHROPLASTY Bilateral 05/05/2010    Family History  Problem Relation Age of Onset  . Alcohol abuse Mother        deceased 73  . Diabetes Father   . Breast cancer Maternal Aunt        dx 50s; deceased 18s  . Breast cancer Maternal Aunt        dx 56s; deceased 37s    Social History Social History   Tobacco Use  . Smoking status:  Never Smoker  . Smokeless tobacco: Never Used  Substance Use Topics  . Alcohol use: No    Alcohol/week: 0.0 standard drinks  . Drug use: No    No Known Allergies  Current Outpatient Medications  Medication Sig Dispense Refill  . amLODipine (NORVASC) 5 MG tablet TAKE 1 TABLET BY MOUTH  DAILY (Patient taking differently: Take 5 mg by mouth at bedtime. ) 90 tablet 1  . atorvastatin (LIPITOR) 80 MG tablet TAKE 1 TABLET BY MOUTH  DAILY (Patient taking differently: Take 80 mg by mouth every evening. ) 90 tablet 1  . CALCIUM-VITAMIN D PO Take 1 tablet by mouth daily.     . cefadroxil (DURICEF) 500 MG capsule Take 1 capsule (500 mg total) by mouth 2 (two) times daily. 20 capsule 0  . CRANBERRY PO Take 1 tablet by mouth 2 (two) times daily.     Marland Kitchen lidocaine-prilocaine (EMLA) cream Apply to affected area once 30 g 3  . Multiple Vitamins tablet Take 1 tablet by mouth daily.     . Omega-3 Fatty Acids (FISH OIL PO) Take 1 capsule by mouth daily.     . ondansetron (ZOFRAN) 8 MG tablet Take 1 tablet (8 mg total) by mouth 2 (two) times daily as needed for refractory nausea / vomiting. Start on day  3 after chemo. 30 tablet 1  . oxybutynin (DITROPAN) 5 MG tablet TAKE 1 TABLET BY MOUTH TWO  TIMES DAILY (Patient taking differently: Take 5 mg by mouth 2 (two) times daily. ) 180 tablet 1  . pantoprazole (PROTONIX) 40 MG tablet TAKE 1 TABLET BY MOUTH  DAILY (Patient taking differently: Take 40 mg by mouth every morning. ) 90 tablet 1  . potassium chloride (K-DUR,KLOR-CON) 10 MEQ tablet TAKE 1 TABLET BY MOUTH  DAILY (Patient taking differently: Take 10 mEq by mouth daily. ) 90 tablet 1  . prochlorperazine (COMPAZINE) 10 MG tablet Take 1 tablet (10 mg total) by mouth every 6 (six) hours as needed (Nausea or vomiting). 30 tablet 1  . torsemide (DEMADEX) 5 MG tablet TAKE 1 TABLET BY MOUTH  DAILY (Patient taking differently: Take 5 mg by mouth daily. ) 90 tablet 1  . Turmeric 500 MG CAPS Take 1,000 mg by mouth 2 (two)  times daily.     Marland Kitchen anastrozole (ARIMIDEX) 1 MG tablet Take 1 tablet (1 mg total) by mouth daily. (Patient not taking: Reported on 10/04/2018) 30 tablet 3  . sulfamethoxazole-trimethoprim (BACTRIM DS) 800-160 MG tablet Take 1 tablet by mouth 2 (two) times daily. 30 tablet 0   No current facility-administered medications for this visit.     Review of Systems Review of Systems  Constitutional: Negative.   Respiratory: Negative.   Cardiovascular: Negative.     Blood pressure 140/80, pulse 74, temperature 97.7 F (36.5 C), temperature source Skin, height 5\' 4"  (1.626 m), weight 189 lb (85.7 kg), SpO2 95 %.  Physical Exam Physical Exam Chest:          Data Reviewed Result 1 Klebsiella oxytocaAbnormal    Comment: Heavy growth  Result 2 Mixed skin flora   Comment: Scant growth  Antimicrobial Susceptibility Comment   Comment:   ** S = Susceptible; I = Intermediate; R = Resistant **           P = Positive; N = Negative        MICS are expressed in micrograms per mL   Antibiotic         RSLT#1  RSLT#2  RSLT#3  RSLT#4  Amoxicillin/Clavulanic Acid  S  Ampicillin           R  Cefepime            S  Ceftriaxone          S  Cefuroxime           S  Ciprofloxacin         S  Ertapenem           S  Gentamicin           S  Imipenem            S  Levofloxacin          S  Meropenem           S  Tetracycline          S  Tobramycin           S  Trimethoprim/Sulfa       S      Assessment Progressive flap necrosis and wound erythema.  Plan We will discontinue Duricef and initiate Bactrim DS, 1 p.o. twice daily.    Return in two days.   I do not anticipate the patient will be able to start her planned chemotherapy on June 6.  The patient  is aware to call back for any questions or concerns. HPI, Physical Exam,  Assessment and Plan have been scribed under the direction and in the presence of Hervey Ard, MD.  Gaspar Cola, CMA  I have completed the exam and reviewed the above documentation for accuracy and completeness.  I agree with the above.  Haematologist has been used and any errors in dictation or transcription are unintentional.  Hervey Ard, M.D., F.A.C.S.  Forest Gleason  10/16/2018, 7:36 PM

## 2018-10-16 ENCOUNTER — Telehealth: Payer: Self-pay | Admitting: *Deleted

## 2018-10-16 NOTE — Telephone Encounter (Signed)
I called the patient and let her know that dr Janese Banks wanted her have and echo so we can look at the strength of her heart before starting her on the chemo. Her appt 6/29 11 am and she will need to get there 15 min earlier to get [facility].she states that her wound is still draining and she will see Byrnett tom. He said she may need to wait a bit longer to start treatment. I told her that dr byrnett will get in touch with Janese Banks if it needs to be push out to a later date. She is agreeable to the plan

## 2018-10-17 ENCOUNTER — Inpatient Hospital Stay: Payer: Medicare Other | Admitting: Hospice and Palliative Medicine

## 2018-10-17 ENCOUNTER — Encounter: Payer: Self-pay | Admitting: General Surgery

## 2018-10-17 ENCOUNTER — Other Ambulatory Visit: Payer: Medicare Other

## 2018-10-17 ENCOUNTER — Ambulatory Visit: Payer: Medicare Other | Admitting: Oncology

## 2018-10-17 ENCOUNTER — Other Ambulatory Visit: Payer: Self-pay

## 2018-10-17 ENCOUNTER — Other Ambulatory Visit: Payer: Self-pay | Admitting: Family Medicine

## 2018-10-17 ENCOUNTER — Ambulatory Visit (INDEPENDENT_AMBULATORY_CARE_PROVIDER_SITE_OTHER): Payer: Medicare Other | Admitting: General Surgery

## 2018-10-17 ENCOUNTER — Ambulatory Visit: Payer: Medicare Other

## 2018-10-17 VITALS — BP 127/74 | HR 101 | Temp 97.9°F | Resp 16 | Ht 64.0 in | Wt 189.8 lb

## 2018-10-17 DIAGNOSIS — T86822 Skin graft (allograft) (autograft) infection: Secondary | ICD-10-CM

## 2018-10-17 DIAGNOSIS — R600 Localized edema: Secondary | ICD-10-CM

## 2018-10-17 NOTE — Patient Instructions (Signed)
Please see your follow up appointment listed below.  °

## 2018-10-17 NOTE — Progress Notes (Signed)
Please patient ID: Theresa Reid, female   DOB: [birth year], 78 y.o.   MRN: [id number]  Chief Complaint  Patient presents with  . Routine Post Op    Right Mastectomy    HPI Theresa Reid is a 78 y.o. female.  Here today for reassessment of the necrosis noted in the mastectomy flap.  Her antibiotics were changed 2 days ago.. Patient feels there has been improvement.  She is experiencing a reasonable amount of clear, odorless drainage from the open wound. HPI  Past Medical History:  Diagnosis Date  . Arthritis   . Breast cancer ([place])   . Diverticulitis   . Family history of breast cancer   . GERD (gastroesophageal reflux disease)   . Hyperlipidemia   . Hypertension     Past Surgical History:  Procedure Laterality Date  . BREAST BIOPSY Right 05/28/2018   [place] b[place], INVASIVE MAMMARY CARCINOMA WITH LOBULAR FEATURES  . BREAST LUMPECTOMY Right 06/12/2018   [facility], lobular features  . BREAST LUMPECTOMY WITH SENTINEL LYMPH NODE BIOPSY Right 06/12/2018   Procedure: RIGHT BREAST WIDE EXCISION WITH SENTINEL LYMPH NODE BX;  Surgeon: Robert Bellow, MD;  Location: [facility];  Service: General;  Laterality: Right;  . CHOLECYSTECTOMY    . [place]  . JOINT REPLACEMENT    . MASTECTOMY WITH AXILLARY LYMPH NODE DISSECTION Right 09/20/2018   Procedure: MASTECTOMY WITH AXILLARY LYMPH NODE DISSECTION RIGHT;  Surgeon: Robert Bellow, MD;  Location: [facility];  Service: General;  Laterality: Right;  . ovaraian cyst removal Right    Cyst only (NOT OVARY)  . PORTACATH PLACEMENT Left 09/20/2018   Procedure: INSERTION PORT-A-CATH, LEFT;  Surgeon: Robert Bellow, MD;  Location: [facility];  Service: General;  Laterality: Left;  . TOTAL KNEE ARTHROPLASTY Bilateral 05/05/2010    Family History  Problem Relation Age of Onset  . Alcohol abuse Mother        deceased 45  . Diabetes Father   . Breast cancer Maternal Aunt        dx 6s; deceased 36s  . Breast cancer Maternal Aunt    dx 72s; deceased 19s    Social History Social History   Tobacco Use  . Smoking status: Never Smoker  . Smokeless tobacco: Never Used  Substance Use Topics  . Alcohol use: No    Alcohol/week: 0.0 standard drinks  . Drug use: No    No Known Allergies  Current Outpatient Medications  Medication Sig Dispense Refill  . anastrozole (ARIMIDEX) 1 MG tablet Take 1 tablet (1 mg total) by mouth daily. 30 tablet 3  . atorvastatin (LIPITOR) 80 MG tablet TAKE 1 TABLET BY MOUTH  DAILY (Patient taking differently: Take 80 mg by mouth every evening. ) 90 tablet 1  . CALCIUM-VITAMIN D PO Take 1 tablet by mouth daily.     . cefadroxil (DURICEF) 500 MG capsule Take 1 capsule (500 mg total) by mouth 2 (two) times daily. 20 capsule 0  . CRANBERRY PO Take 1 tablet by mouth 2 (two) times daily.     Marland Kitchen lidocaine-prilocaine (EMLA) cream Apply to affected area once 30 g 3  . Multiple Vitamins tablet Take 1 tablet by mouth daily.     . Omega-3 Fatty Acids (FISH OIL PO) Take 1 capsule by mouth daily.     . ondansetron (ZOFRAN) 8 MG tablet Take 1 tablet (8 mg total) by mouth 2 (two) times daily as needed for refractory nausea / vomiting. Start on  day 3 after chemo. 30 tablet 1  . oxybutynin (DITROPAN) 5 MG tablet TAKE 1 TABLET BY MOUTH TWO  TIMES DAILY (Patient taking differently: Take 5 mg by mouth 2 (two) times daily. ) 180 tablet 1  . potassium chloride (K-DUR,KLOR-CON) 10 MEQ tablet TAKE 1 TABLET BY MOUTH  DAILY (Patient taking differently: Take 10 mEq by mouth daily. ) 90 tablet 1  . prochlorperazine (COMPAZINE) 10 MG tablet Take 1 tablet (10 mg total) by mouth every 6 (six) hours as needed (Nausea or vomiting). 30 tablet 1  . sulfamethoxazole-trimethoprim (BACTRIM DS) 800-160 MG tablet Take 1 tablet by mouth 2 (two) times daily. 30 tablet 0  . Turmeric 500 MG CAPS Take 1,000 mg by mouth 2 (two) times daily.     Marland Kitchen amLODipine (NORVASC) 5 MG tablet TAKE 1 TABLET BY MOUTH  DAILY 90 tablet 1  . pantoprazole  (PROTONIX) 40 MG tablet TAKE 1 TABLET BY MOUTH  DAILY 90 tablet 1  . torsemide (DEMADEX) 5 MG tablet TAKE 1 TABLET BY MOUTH  DAILY 90 tablet 1   No current facility-administered medications for this visit.     Review of Systems Review of Systems  Constitutional: Negative.   Respiratory: Negative.   Cardiovascular: Negative.     Blood pressure 127/74, pulse (!) 101, temperature 97.9 F (36.6 C), temperature source Temporal, resp. rate 16, height 5\' 4"  (1.626 m), weight 189 lb 12.8 oz (86.1 kg), SpO2 95 %.  Physical Exam Physical Exam Exam conducted with a chaperone present.  Constitutional:      Appearance: She is well-developed.  Eyes:     General: No scleral icterus.    Conjunctiva/sclera: Conjunctivae normal.  Neck:     Musculoskeletal: Normal range of motion.  Cardiovascular:     Rate and Rhythm: Normal rate and regular rhythm.     Heart sounds: Normal heart sounds.  Pulmonary:     Effort: Pulmonary effort is normal.     Breath sounds: Normal breath sounds.  Chest:    Lymphadenopathy:     Cervical: No cervical adenopathy.  Skin:    General: Skin is warm and dry.  Neurological:     Mental Status: She is alert and oriented to person, place, and time.     Decreased erythema.  Stabilization of necrosis medially.  Data Reviewed No new data.  Assessment Flap necrosis post mastectomy..  Plan  The patient is shown reasonable improvement over the last 48 hours.  We will continue on Septra DS twice daily.  She is aware to call for fever or chills.  She may require operative debridement and reclosure, but this decision will be deferred to her follow-up in 5 days.  HPI, Physical Exam, Assessment and Plan have been scribed under the direction and in the presence of Robert Bellow, MD. [name], CMA  I have completed the exam and reviewed the above documentation for accuracy and completeness.  I agree with the above.  Haematologist has been used and any  errors in dictation or transcription are unintentional.  Hervey Ard, M.D., F.A.C.S.  Forest Gleason Lakendria Nicastro 10/18/2018, 6:21 PM

## 2018-10-21 ENCOUNTER — Ambulatory Visit
Admission: RE | Admit: 2018-10-21 | Discharge: 2018-10-21 | Disposition: A | Discharge status: Home / Self Care | Payer: Medicare Other | Source: Ambulatory Visit | Attending: Oncology | Admitting: Oncology

## 2018-10-21 ENCOUNTER — Other Ambulatory Visit: Payer: Self-pay

## 2018-10-21 DIAGNOSIS — C50411 Malignant neoplasm of upper-outer quadrant of right female breast: Secondary | ICD-10-CM | POA: Diagnosis not present

## 2018-10-21 DIAGNOSIS — E785 Hyperlipidemia, unspecified: Secondary | ICD-10-CM | POA: Diagnosis not present

## 2018-10-21 DIAGNOSIS — Z170 Estrogen receptor positive status [ER+]: Secondary | ICD-10-CM | POA: Insufficient documentation

## 2018-10-21 DIAGNOSIS — I10 Essential (primary) hypertension: Secondary | ICD-10-CM | POA: Diagnosis not present

## 2018-10-21 NOTE — Progress Notes (Signed)
*  PRELIMINARY RESULTS* Echocardiogram 2D Echocardiogram has been performed.  Theresa Reid 10/21/2018, 11:51 AM

## 2018-10-22 ENCOUNTER — Other Ambulatory Visit: Payer: Self-pay

## 2018-10-22 ENCOUNTER — Ambulatory Visit (INDEPENDENT_AMBULATORY_CARE_PROVIDER_SITE_OTHER): Payer: Medicare Other | Admitting: General Surgery

## 2018-10-22 ENCOUNTER — Encounter: Payer: Self-pay | Admitting: General Surgery

## 2018-10-22 VITALS — BP 100/60 | HR 70 | Temp 97.2°F | Resp 18 | Ht 64.0 in | Wt 188.0 lb

## 2018-10-22 DIAGNOSIS — T86822 Skin graft (allograft) (autograft) infection: Secondary | ICD-10-CM

## 2018-10-22 NOTE — Progress Notes (Signed)
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; s/p mastectomy and axillary lymph node dissection  Chief complaint/Reason for visit- Initial Meeting for [facility], preparing for starting chemotherapy  Heme/Onc history:  Oncology History  Malignant neoplasm of right female breast ([place])  06/04/2018 Cancer Staging   Staging form: Breast, AJCC 8th Edition - Clinical stage from 06/04/2018: Stage IA (cT1c, cN0, cM0, G1, ER+, PR+, HER2-) - Signed by Sindy Guadeloupe, MD on 06/07/2018   06/07/2018 Initial Diagnosis   Malignant neoplasm of right female breast ([facility])   07/04/2018 Cancer Staging   Staging form: Breast, AJCC 8th Edition - Pathologic stage from 07/04/2018: Stage IB (pT2, pN1a, cM0, G2, ER+, PR+, HER2-) - Signed by Sindy Guadeloupe, MD on 07/04/2018   08/05/2018 -  Chemotherapy   The patient had palonosetron (ALOXI) injection 0.25 mg, 0.25 mg, Intravenous,  Once, 0 of 4 cycles pegfilgrastim (NEULASTA ONPRO KIT) injection 6 mg, 6 mg, Subcutaneous, Once, 0 of 4 cycles cyclophosphamide (CYTOXAN) 1,200 mg in sodium chloride 0.9 % 250 mL chemo infusion, 600 mg/m2, Intravenous,  Once, 0 of 4 cycles DOCEtaxel (TAXOTERE) 150 mg in sodium chloride 0.9 % 250 mL chemo infusion, 75 mg/m2, Intravenous,  Once, 0 of 4 cycles  for chemotherapy treatment.    10/04/2018 Cancer Staging   Staging form: Breast, AJCC 8th Edition - Pathologic stage from 10/04/2018: Stage IIIA (pT2, pN3a, cM0, G2, ER+, PR+, HER2-) - Signed by Sindy Guadeloupe, MD on 10/06/2018     Interval history-78 year old female who presents to chemo care clinic today for initial meeting in preparation for starting chemotherapy. I introduced  the chemo care clinic and we discussed that the role of the clinic is to assist those who are at an increased risk of emergency room visits and/or complications during the course of chemotherapy treatment. We discussed that the increased risk takes into account factors such as age, performance status, and co-morbidities. We also discussed that for some, this might include barriers to care such as not having a primary care provider, lack of insurance/transportation, or not being able to afford medications. We discussed that the goal of the program is to help prevent unplanned ER visits and help reduce complications during chemotherapy. We do this by discussing specific risk factors to each individual and identifying ways that we can help improve these risk factors and reduce barriers to care.   ECOG FS:1 - Symptomatic but completely ambulatory  Review of systems- Review of Systems  Constitutional: Negative.  Negative for chills, fever, malaise/fatigue and weight loss.  HENT: Negative for congestion, ear pain and tinnitus.   Eyes: Negative.  Negative for blurred vision and double vision.  Respiratory: Negative.  Negative for cough, sputum production and shortness of breath.   Cardiovascular: Negative.  Negative for chest pain, palpitations and leg swelling.  Gastrointestinal: Negative.  Negative for abdominal pain, constipation, diarrhea, nausea and vomiting.  Genitourinary: Negative for dysuria, frequency and urgency.  Musculoskeletal: Negative for back pain and falls.  Skin: Negative.  Negative for rash.  Neurological: Negative.  Negative for weakness and headaches.  Endo/Heme/Allergies: Negative.  Does not bruise/bleed easily.  Psychiatric/Behavioral: Negative.  Negative for depression. The patient is not nervous/anxious and does not  have insomnia.      Current treatment-scheduled to begin chemo on 10/28/2018 with dose dense AC and Neulasta.  No Known Allergies  Past Medical History:   Diagnosis Date   Arthritis    Breast cancer ([place])    Diverticulitis    Family history of breast cancer    GERD (gastroesophageal reflux disease)    Hyperlipidemia    Hypertension     Past Surgical History:  Procedure Laterality Date   BREAST BIOPSY Right 05/28/2018   [place] bx, INVASIVE MAMMARY CARCINOMA WITH LOBULAR FEATURES   BREAST LUMPECTOMY Right 06/12/2018   [place], lobular features   BREAST LUMPECTOMY WITH SENTINEL LYMPH NODE BIOPSY Right 06/12/2018   Procedure: RIGHT BREAST WIDE EXCISION WITH SENTINEL LYMPH NODE BX;  Surgeon: Robert Bellow, MD;  Location: [facility] ORS;  Service: General;  Laterality: Right;   [place]   JOINT REPLACEMENT     MASTECTOMY WITH AXILLARY LYMPH NODE DISSECTION Right 09/20/2018   Procedure: MASTECTOMY WITH AXILLARY LYMPH NODE DISSECTION RIGHT;  Surgeon: Robert Bellow, MD;  Location: [facility] ORS;  Service: General;  Laterality: Right;   ovaraian cyst removal Right    Cyst only (NOT OVARY)   PORTACATH PLACEMENT Left 09/20/2018   Procedure: INSERTION PORT-A-CATH, LEFT;  Surgeon: Robert Bellow, MD;  Location: [facility] ORS;  Service: General;  Laterality: Left;   TOTAL KNEE ARTHROPLASTY Bilateral 05/05/2010    Social History   Socioeconomic History   Marital status: Single    Spouse name: Not on file   Number of children: Not on file   Years of education: Not on file   Highest education level: Not on file  Occupational History   Occupation: retired  Scientist, product/process development strain: Not hard at all   Food insecurity    Worry: Never true    Inability: Never true   Transportation needs    Medical: No    Non-medical: No  Tobacco Use   Smoking status: Never Smoker   Smokeless tobacco: Never Used  Substance and Sexual Activity   Alcohol use: No    Alcohol/week: 0.0 standard drinks   Drug use: No   Sexual activity: Never  Lifestyle   Physical activity    Days per  week: Not on file    Minutes per session: Not on file   Stress: Not at all  Relationships   Social connections    Talks on phone: Not on file    Gets together: Not on file    Attends religious service: Not on file    Active member of club or organization: Not on file    Attends meetings of clubs or organizations: Not on file    Relationship status: Not on file   Intimate partner violence    Fear of current or ex partner: Not on file    Emotionally abused: Not on file    Physically abused: Not on file    Forced sexual activity: Not on file  Other Topics Concern   Not on file  Social History Narrative   Retired    Lives by herself    Pets: None   Caffeine- Coffee 2 cups daily, no tea/soda       Family History  Problem Relation Age of Onset   Alcohol abuse Mother        deceased 62   Diabetes Father    Breast cancer Maternal Aunt  dx 23s; deceased 38s   Breast cancer Maternal Aunt        dx 56s; deceased 59s     Current Outpatient Medications:    amLODipine (NORVASC) 5 MG tablet, TAKE 1 TABLET BY MOUTH  DAILY, Disp: 90 tablet, Rfl: 1   anastrozole (ARIMIDEX) 1 MG tablet, Take 1 tablet (1 mg total) by mouth daily., Disp: 30 tablet, Rfl: 3   atorvastatin (LIPITOR) 80 MG tablet, TAKE 1 TABLET BY MOUTH  DAILY (Patient taking differently: Take 80 mg by mouth every evening. ), Disp: 90 tablet, Rfl: 1   CALCIUM-VITAMIN D PO, Take 1 tablet by mouth daily. , Disp: , Rfl:    cefadroxil (DURICEF) 500 MG capsule, Take 1 capsule (500 mg total) by mouth 2 (two) times daily., Disp: 20 capsule, Rfl: 0   CRANBERRY PO, Take 1 tablet by mouth 2 (two) times daily. , Disp: , Rfl:    lidocaine-prilocaine (EMLA) cream, Apply to affected area once, Disp: 30 g, Rfl: 3   Multiple Vitamins tablet, Take 1 tablet by mouth daily. , Disp: , Rfl:    Omega-3 Fatty Acids (FISH OIL PO), Take 1 capsule by mouth daily. , Disp: , Rfl:    ondansetron (ZOFRAN) 8 MG tablet, Take 1 tablet  (8 mg total) by mouth 2 (two) times daily as needed for refractory nausea / vomiting. Start on day 3 after chemo., Disp: 30 tablet, Rfl: 1   oxybutynin (DITROPAN) 5 MG tablet, TAKE 1 TABLET BY MOUTH TWO  TIMES DAILY (Patient taking differently: Take 5 mg by mouth 2 (two) times daily. ), Disp: 180 tablet, Rfl: 1   pantoprazole (PROTONIX) 40 MG tablet, TAKE 1 TABLET BY MOUTH  DAILY, Disp: 90 tablet, Rfl: 1   potassium chloride (K-DUR,KLOR-CON) 10 MEQ tablet, TAKE 1 TABLET BY MOUTH  DAILY (Patient taking differently: Take 10 mEq by mouth daily. ), Disp: 90 tablet, Rfl: 1   prochlorperazine (COMPAZINE) 10 MG tablet, Take 1 tablet (10 mg total) by mouth every 6 (six) hours as needed (Nausea or vomiting)., Disp: 30 tablet, Rfl: 1   sulfamethoxazole-trimethoprim (BACTRIM DS) 800-160 MG tablet, Take 1 tablet by mouth 2 (two) times daily., Disp: 30 tablet, Rfl: 0   torsemide (DEMADEX) 5 MG tablet, TAKE 1 TABLET BY MOUTH  DAILY, Disp: 90 tablet, Rfl: 1   Turmeric 500 MG CAPS, Take 1,000 mg by mouth 2 (two) times daily. , Disp: , Rfl:   Physical exam: There were no vitals filed for this visit. Physical Exam Constitutional:      Appearance: Normal appearance.  HENT:     Head: Normocephalic and atraumatic.  Eyes:     Pupils: Pupils are equal, round, and reactive to light.  Neck:     Musculoskeletal: Normal range of motion.  Cardiovascular:     Rate and Rhythm: Normal rate and regular rhythm.     Heart sounds: Normal heart sounds. No murmur.  Pulmonary:     Effort: Pulmonary effort is normal.     Breath sounds: Normal breath sounds. No wheezing.  Abdominal:     General: Bowel sounds are normal. There is no distension.     Palpations: Abdomen is soft.     Tenderness: There is no abdominal tenderness.  Musculoskeletal: Normal range of motion.  Skin:    General: Skin is warm and dry.     Findings: No rash.  Neurological:     Mental Status: She is alert and oriented to person, place, and time.  Psychiatric:        Judgment: Judgment normal.      CMP Latest Ref Rng & Units 10/11/2018  Glucose 70 - 99 mg/dL -  BUN 8 - 23 mg/dL -  Creatinine 0.44 - 1.00 mg/dL 0.30(L)  Sodium 135 - 145 mmol/L -  Potassium 3.5 - 5.1 mmol/L -  Chloride 98 - 111 mmol/L -  CO2 22 - 32 mmol/L -  Calcium 8.9 - 10.3 mg/dL -  Total Protein 6.0 - 8.3 g/dL -  Total Bilirubin 0.2 - 1.2 mg/dL -  Alkaline Phos 39 - 117 U/L -  AST 0 - 37 U/L -  ALT 0 - 35 U/L -   CBC Latest Ref Rng & Units 06/12/2018  WBC 4.0 - 10.5 K/uL 8.7  Hemoglobin 12.0 - 15.0 g/dL 13.7  Hematocrit 36.0 - 46.0 % 42.2  Platelets 150 - 400 K/uL 246    No images are attached to the encounter.  Ct Chest W Contrast  Result Date: 10/11/2018 CLINICAL DATA:  Right breast cancer diagnosed in January 2020. Right mastectomy and axillary node dissection 09/20/2018. No current complaints. EXAM: CT CHEST, ABDOMEN, AND PELVIS WITH CONTRAST TECHNIQUE: Multidetector CT imaging of the chest, abdomen and pelvis was performed following the standard protocol during bolus administration of intravenous contrast. CONTRAST:  142m OMNIPAQUE IOHEXOL 300 MG/ML  SOLN COMPARISON:  None. FINDINGS: CT CHEST FINDINGS Cardiovascular: Atherosclerosis of the aorta, great vessels and coronary arteries. There is a left subclavian Port-A-Cath with its tip terminating near the confluence of the brachiocephalic veins. The heart size is normal. There is no pericardial effusion. Mediastinum/Nodes: There are no enlarged mediastinal, hilar, axillary or internal mammary lymph nodes. Postsurgical changes are present within the right anterior chest wall and right axilla related to recent mastectomy and axillary node dissection. There is soft tissue emphysema throughout the right chest wall extending into the axilla. There is a small amount ill-defined fluid in the right axilla. The thyroid gland, trachea and esophagus demonstrate no significant findings. Lungs/Pleura: There is no  pleural effusion or pneumothorax. There is mild linear atelectasis or scarring at both lung bases. No suspicious pulmonary nodules. Musculoskeletal/Chest wall: No chest wall mass or suspicious osseous findings. Postsurgical changes in the right chest wall and axilla with associated soft tissue emphysema as noted above. CT ABDOMEN AND PELVIS FINDINGS Hepatobiliary: The liver is normal in density without suspicious focal abnormality. Probable subcentimeter hepatic cysts on images 44 and 53 of series 2. no significant biliary dilatation post cholecystectomy. Pancreas: Unremarkable. No pancreatic ductal dilatation or surrounding inflammatory changes. Spleen: Normal in size without focal abnormality. Adrenals/Urinary Tract: Both adrenal glands appear normal. Probable tiny cyst in the lower pole of the right kidney. No evidence of renal mass, urinary tract calculus or hydronephrosis. Mild bladder wall thickening, possibly due to incomplete bladder distention. No focal abnormality. Stomach/Bowel: No evidence of bowel wall thickening, distention or surrounding inflammatory change. Vascular/Lymphatic: There are no enlarged abdominal or pelvic lymph nodes. Mild aortic and branch vessel atherosclerosis. Reproductive: The uterus and ovaries appear normal. No adnexal mass. Suspected mild pelvic floor laxity. Other: No ascites or peritoneal nodularity. No significant abdominal wall soft tissue emphysema. Musculoskeletal: No acute or significant osseous findings. Mild scoliosis, lumbar spondylosis and osteitis pubis. IMPRESSION: 1. No evidence of metastatic disease in the chest, abdomen or pelvis. 2. Postsurgical changes from recent right mastectomy and axillary node dissection. There is prominent soft tissue emphysema in the right chest wall and a small residual fluid collection in  the axilla. 3. Small probable hepatic and right renal cysts. Aortic Atherosclerosis (ICD10-I70.0). Electronically Signed   By: Richardean Sale M.D.    On: 10/11/2018 15:45   Nm Bone Scan Whole Body  Result Date: 10/11/2018 CLINICAL DATA:  Breast cancer, RIGHT mastectomy 09/20/2018 EXAM: NUCLEAR MEDICINE WHOLE BODY BONE SCAN TECHNIQUE: Whole body anterior and posterior images were obtained approximately 3 hours after intravenous injection of radiopharmaceutical. RADIOPHARMACEUTICALS:  23.334 mCi Technetium-29mMDP IV COMPARISON:  None Radiographic correlation: CT chest abdomen pelvis 10/10/2017 FINDINGS: Uptake at the shoulders, wrists, hips greater on RIGHT, and feet, typically degenerative. BILATERAL knee prostheses. Abnormal tracer localization within the lumbar spine greatest at RIGHT L2-L3 and L3-L4, favor degenerative; these correspond to degenerative disc disease changes on CT. Uptake bilaterally in the thoracic spine at approximately T6,, favor degenerative. No definite sites of abnormal osseous tracer accumulation are seen to suggest osseous metastatic disease. Faint tracer localization within RIGHT breast, suspect related to recent surgery Otherwise expected urinary tract and soft tissue distribution of tracer. IMPRESSION: No scintigraphic evidence of osseous metastatic disease. Electronically Signed   By: MLavonia DanaM.D.   On: 10/11/2018 17:27   Ct Abdomen Pelvis W Contrast  Result Date: 10/11/2018 CLINICAL DATA:  Right breast cancer diagnosed in January 2020. Right mastectomy and axillary node dissection 09/20/2018. No current complaints. EXAM: CT CHEST, ABDOMEN, AND PELVIS WITH CONTRAST TECHNIQUE: Multidetector CT imaging of the chest, abdomen and pelvis was performed following the standard protocol during bolus administration of intravenous contrast. CONTRAST:  1060mOMNIPAQUE IOHEXOL 300 MG/ML  SOLN COMPARISON:  None. FINDINGS: CT CHEST FINDINGS Cardiovascular: Atherosclerosis of the aorta, great vessels and coronary arteries. There is a left subclavian Port-A-Cath with its tip terminating near the confluence of the brachiocephalic veins.  The heart size is normal. There is no pericardial effusion. Mediastinum/Nodes: There are no enlarged mediastinal, hilar, axillary or internal mammary lymph nodes. Postsurgical changes are present within the right anterior chest wall and right axilla related to recent mastectomy and axillary node dissection. There is soft tissue emphysema throughout the right chest wall extending into the axilla. There is a small amount ill-defined fluid in the right axilla. The thyroid gland, trachea and esophagus demonstrate no significant findings. Lungs/Pleura: There is no pleural effusion or pneumothorax. There is mild linear atelectasis or scarring at both lung bases. No suspicious pulmonary nodules. Musculoskeletal/Chest wall: No chest wall mass or suspicious osseous findings. Postsurgical changes in the right chest wall and axilla with associated soft tissue emphysema as noted above. CT ABDOMEN AND PELVIS FINDINGS Hepatobiliary: The liver is normal in density without suspicious focal abnormality. Probable subcentimeter hepatic cysts on images 44 and 53 of series 2. no significant biliary dilatation post cholecystectomy. Pancreas: Unremarkable. No pancreatic ductal dilatation or surrounding inflammatory changes. Spleen: Normal in size without focal abnormality. Adrenals/Urinary Tract: Both adrenal glands appear normal. Probable tiny cyst in the lower pole of the right kidney. No evidence of renal mass, urinary tract calculus or hydronephrosis. Mild bladder wall thickening, possibly due to incomplete bladder distention. No focal abnormality. Stomach/Bowel: No evidence of bowel wall thickening, distention or surrounding inflammatory change. Vascular/Lymphatic: There are no enlarged abdominal or pelvic lymph nodes. Mild aortic and branch vessel atherosclerosis. Reproductive: The uterus and ovaries appear normal. No adnexal mass. Suspected mild pelvic floor laxity. Other: No ascites or peritoneal nodularity. No significant  abdominal wall soft tissue emphysema. Musculoskeletal: No acute or significant osseous findings. Mild scoliosis, lumbar spondylosis and osteitis pubis. IMPRESSION: 1. No evidence of  metastatic disease in the chest, abdomen or pelvis. 2. Postsurgical changes from recent right mastectomy and axillary node dissection. There is prominent soft tissue emphysema in the right chest wall and a small residual fluid collection in the axilla. 3. Small probable hepatic and right renal cysts. Aortic Atherosclerosis (ICD10-I70.0). Electronically Signed   By: Richardean Sale M.D.   On: 10/11/2018 15:45     Assessment and plan- Patient is a 78 y.o. female who presents to [facility] for initial meeting in preparation for starting chemotherapy for the treatment of stage IIIa right breast cancer. S/p mastectomy.    1. Cancer-stage IIIa right breast cancer.  Scheduled to begin chemotherapy with dose dense AC and Neulasta.  Status post mastectomy [place] 2020.  Healing well.  Recently had a abdomen pelvis scan was negative for metastatic disease.  Had echo revealing an EF of 60 to 65%.   2. Chemo Care Clinic/High Risk for ER/Hospitalization during chemotherapy- We discussed the role of the chemo care clinic and identified patient specific risk factors. I discussed that patient was identified as high risk primarily based on: Stage of her cancer, Medicare, connective tissue disorder and 2 hospital admissions in the past 12 months.  She is followed by her PCP Dr. Caryl Bis.  She is not married and lives alone.  She continues to drive herself without problem.  Her daughter lives nearby.   Admissions appear to be scheduled admissions for right breast cancer biopsy and mastectomy.  3. Social Determinants of Health- we discussed that social determinants of health may have significant impacts on health and outcomes for cancer patients.  Today we discussed specific social determinants of performance status, alcohol use,  depression, financial needs, food insecurity, housing, interpersonal violence, social connections, stress, tobacco use, and transportation.  After lengthy discussion the following were identified as areas of need: Patient does not appear to have any obvious insecurities at this time.  We reviewed all available resources at the cancer center and she appreciated the information and would get back with [place] for further questions or concerns during her treatments.   4. Co-morbidities Complicating Care: Patient has history of hypertension, GERD, hyperlipidemia, arthritis and urge incontinence.   5. Palliative Care- based on stage of cancer , I will refer patient to palliative care for goals of care and advanced care planning.  We also discussed the role of the Symptom Management Clinic at [facility] for acute issues and methods of contacting clinic/provider. She denies needing specific assistance at this time and She will be followed by , Tanya Nones RN (Nurse Navigator).   Visit Diagnosis 1. Malignant neoplasm of right breast in female, estrogen receptor positive, unspecified site of breast [facility])     Patient expressed understanding and was in agreement with this plan. She also understands that She can call clinic at any time with any questions, concerns, or complaints.   A total of (25) minutes of face-to-face time was spent with this patient with greater than 50% of that time in counseling and care-coordination.  Faythe Casa, AGNP-C [place] [facility] [phone] (work cell) [phone] (office)

## 2018-10-22 NOTE — Patient Instructions (Signed)
Continue to shower as usual. Keep a dressing over the area. Continue the antibiotic.

## 2018-10-22 NOTE — Progress Notes (Signed)
Patient ID: Theresa Reid, female   DOB: [birth year], 78 y.o.   MRN: [id number]  Chief Complaint  Patient presents with  . Routine Post Op    Right breast mastectomy    HPI Theresa Reid is a 78 y.o. female.  Here today post op care right breast mastectomy. Patient states she is currently taking Bactrim. Denies fever and chills.  HPI  Past Medical History:  Diagnosis Date  . Arthritis   . Breast cancer ([place])   . Diverticulitis   . Family history of breast cancer   . GERD (gastroesophageal reflux disease)   . Hyperlipidemia   . Hypertension     Past Surgical History:  Procedure Laterality Date  . BREAST BIOPSY Right 05/28/2018   [place] bx, INVASIVE MAMMARY CARCINOMA WITH LOBULAR FEATURES  . BREAST LUMPECTOMY Right 06/12/2018   [facility], lobular features  . BREAST LUMPECTOMY WITH SENTINEL LYMPH NODE BIOPSY Right 06/12/2018   Procedure: RIGHT BREAST WIDE EXCISION WITH SENTINEL LYMPH NODE BX;  Surgeon: Robert Bellow, MD;  Location: [facility] ORS;  Service: General;  Laterality: Right;  . CHOLECYSTECTOMY    . [place]  . JOINT REPLACEMENT    . MASTECTOMY WITH AXILLARY LYMPH NODE DISSECTION Right 09/20/2018   Procedure: MASTECTOMY WITH AXILLARY LYMPH NODE DISSECTION RIGHT;  Surgeon: Robert Bellow, MD;  Location: [facility] OR[facility];  Service: General;  Laterality: Right;  . ovaraian cyst removal Right    Cyst only (NOT OVARY)  . PORTACATH PLACEMENT Left 09/20/2018   Procedure: INSERTION PORT-A-CATH, LEFT;  Surgeon: Robert Bellow, MD;  Location: [facility] ORS;  Service: General;  Laterality: Left;  . TOTAL KNEE ARTHROPLASTY Bilateral 05/05/2010    Family History  Problem Relation Age of Onset  . Alcohol abuse Mother        deceased 58  . Diabetes Father   . Breast cancer Maternal Aunt        dx 60s; deceased 34s  . Breast cancer Maternal Aunt        dx 92s; deceased 70s    Social History Social History   Tobacco Use  . Smoking status: Never Smoker  . Smokeless  tobacco: Never Used  Substance Use Topics  . Alcohol use: No    Alcohol/week: 0.0 standard drinks  . Drug use: No    No Known Allergies  Current Outpatient Medications  Medication Sig Dispense Refill  . amLODipine (NORVASC) 5 MG tablet TAKE 1 TABLET BY MOUTH  DAILY 90 tablet 1  . anastrozole (ARIMIDEX) 1 MG tablet Take 1 tablet (1 mg total) by mouth daily. 30 tablet 3  . atorvastatin (LIPITOR) 80 MG tablet TAKE 1 TABLET BY MOUTH  DAILY (Patient taking differently: Take 80 mg by mouth every evening. ) 90 tablet 1  . CALCIUM-VITAMIN D PO Take 1 tablet by mouth daily.     . cefadroxil (DURICEF) 500 MG capsule Take 1 capsule (500 mg total) by mouth 2 (two) times daily. 20 capsule 0  . CRANBERRY PO Take 1 tablet by mouth 2 (two) times daily.     Marland Kitchen lidocaine-prilocaine (EMLA) cream Apply to affected area once 30 g 3  . Multiple Vitamins tablet Take 1 tablet by mouth daily.     . Omega-3 Fatty Acids (FISH OIL PO) Take 1 capsule by mouth daily.     . ondansetron (ZOFRAN) 8 MG tablet Take 1 tablet (8 mg total) by mouth 2 (two) times daily as needed for refractory nausea / vomiting. Start  on day 3 after chemo. 30 tablet 1  . oxybutynin (DITROPAN) 5 MG tablet TAKE 1 TABLET BY MOUTH TWO  TIMES DAILY (Patient taking differently: Take 5 mg by mouth 2 (two) times daily. ) 180 tablet 1  . pantoprazole (PROTONIX) 40 MG tablet TAKE 1 TABLET BY MOUTH  DAILY 90 tablet 1  . potassium chloride (K-DUR,KLOR-CON) 10 MEQ tablet TAKE 1 TABLET BY MOUTH  DAILY (Patient taking differently: Take 10 mEq by mouth daily. ) 90 tablet 1  . prochlorperazine (COMPAZINE) 10 MG tablet Take 1 tablet (10 mg total) by mouth every 6 (six) hours as needed (Nausea or vomiting). 30 tablet 1  . sulfamethoxazole-trimethoprim (BACTRIM DS) 800-160 MG tablet Take 1 tablet by mouth 2 (two) times daily. 30 tablet 0  . torsemide (DEMADEX) 5 MG tablet TAKE 1 TABLET BY MOUTH  DAILY 90 tablet 1  . Turmeric 500 MG CAPS Take 1,000 mg by mouth 2  (two) times daily.      No current facility-administered medications for this visit.     Review of Systems Review of Systems  Constitutional: Negative.   Respiratory: Negative.   Cardiovascular: Negative.     Blood pressure 100/60, pulse 70, temperature (!) 97.2 F (36.2 C), temperature source Temporal, resp. rate 18, height 5\' 4"  (1.626 m), weight 188 lb (85.3 kg), SpO2 96 %.  Physical Exam Physical Exam Constitutional:      Appearance: She is well-developed.  Eyes:     Conjunctiva/sclera: Conjunctivae normal.  Neck:     Musculoskeletal: Normal range of motion.  Cardiovascular:     Rate and Rhythm: Normal rate and regular rhythm.     Heart sounds: Normal heart sounds.  Pulmonary:     Effort: Pulmonary effort is normal.     Breath sounds: Normal breath sounds.  Chest:    Skin:    General: Skin is warm and dry.  Neurological:     Mental Status: She is alert and oriented to person, place, and time.       Data Reviewed No new data.  Assessment Continued improvement on oral antibiotics.  Plan We will plan for reassessment next week.  Patient may require operative debridement.  Continue to shower as usual. Keep a dressing over the area. Continue the antibiotic.   HPI, Physical Exam, Assessment and Plan have been scribed under the direction and in the presence of Robert Bellow, MD. [name], CMA  I have completed the exam and reviewed the above documentation for accuracy and completeness.  I agree with the above.  Haematologist has been used and any errors in dictation or transcription are unintentional.  Hervey Ard, M.D., F.A.C.S.  Theresa Reid 10/23/2018, 9:21 PM

## 2018-10-28 ENCOUNTER — Inpatient Hospital Stay: Payer: Medicare Other

## 2018-10-28 ENCOUNTER — Inpatient Hospital Stay: Payer: Medicare Other | Admitting: Hospice and Palliative Medicine

## 2018-10-28 ENCOUNTER — Inpatient Hospital Stay: Payer: Medicare Other | Admitting: Oncology

## 2018-10-29 ENCOUNTER — Encounter: Payer: Self-pay | Admitting: General Surgery

## 2018-10-29 ENCOUNTER — Ambulatory Visit (INDEPENDENT_AMBULATORY_CARE_PROVIDER_SITE_OTHER): Payer: Medicare Other | Admitting: General Surgery

## 2018-10-29 ENCOUNTER — Other Ambulatory Visit
Admission: RE | Admit: 2018-10-29 | Discharge: 2018-10-29 | Disposition: A | Discharge status: Home / Self Care | Payer: Medicare Other | Source: Ambulatory Visit | Attending: General Surgery | Admitting: General Surgery

## 2018-10-29 ENCOUNTER — Other Ambulatory Visit: Payer: Self-pay

## 2018-10-29 VITALS — BP 138/85 | HR 93 | Temp 97.7°F | Resp 16 | Ht 64.0 in | Wt 187.2 lb

## 2018-10-29 DIAGNOSIS — L7682 Other postprocedural complications of skin and subcutaneous tissue: Secondary | ICD-10-CM | POA: Diagnosis not present

## 2018-10-29 DIAGNOSIS — Z853 Personal history of malignant neoplasm of breast: Secondary | ICD-10-CM | POA: Diagnosis not present

## 2018-10-29 DIAGNOSIS — Z79899 Other long term (current) drug therapy: Secondary | ICD-10-CM | POA: Diagnosis not present

## 2018-10-29 DIAGNOSIS — Z79811 Long term (current) use of aromatase inhibitors: Secondary | ICD-10-CM | POA: Diagnosis not present

## 2018-10-29 DIAGNOSIS — T86822 Skin graft (allograft) (autograft) infection: Secondary | ICD-10-CM

## 2018-10-29 DIAGNOSIS — K219 Gastro-esophageal reflux disease without esophagitis: Secondary | ICD-10-CM | POA: Diagnosis not present

## 2018-10-29 DIAGNOSIS — I10 Essential (primary) hypertension: Secondary | ICD-10-CM | POA: Diagnosis not present

## 2018-10-29 DIAGNOSIS — Z803 Family history of malignant neoplasm of breast: Secondary | ICD-10-CM | POA: Diagnosis not present

## 2018-10-29 DIAGNOSIS — M1990 Unspecified osteoarthritis, unspecified site: Secondary | ICD-10-CM | POA: Diagnosis not present

## 2018-10-29 DIAGNOSIS — E785 Hyperlipidemia, unspecified: Secondary | ICD-10-CM | POA: Diagnosis not present

## 2018-10-29 DIAGNOSIS — Z1159 Encounter for screening for other viral diseases: Secondary | ICD-10-CM | POA: Diagnosis not present

## 2018-10-29 LAB — SARS CORONAVIRUS 2 (TAT 6-24 HRS): SARS Coronavirus 2: NEGATIVE

## 2018-10-29 NOTE — Patient Instructions (Signed)
The patient is scheduled for surgery at [facility] on 10/30/18 with Dr Bary Castilla. She will have Covid testing done today. She will arrive on 10/30/18 for surgery at 12:00 pm and report to the [facility]. She will have nothing to eat after midnight and will only have clear liquids like apple juice, water, and Gatorade until 10:00 am tomorrow. She will only take the following medications that morning, Amlodipine, Bactrim, and Oxybutynin. The patient is aware of date, time, and instructions.

## 2018-10-29 NOTE — Progress Notes (Signed)
Patient ID: Theresa Reid, female   DOB: 1940/10/14, 78 y.o.   MRN: [id number]  Chief Complaint  Patient presents with  . Routine Post Op    HPI Theresa Reid is a 78 y.o. female here for a post op visit for right mastectomy done on 09/20/18. Last day of Bactrim DS.  HPI  Past Medical History:  Diagnosis Date  . Arthritis   . Breast cancer ([place])   . Diverticulitis   . Family history of breast cancer   . GERD (gastroesophageal reflux disease)   . Hyperlipidemia   . Hypertension     Past Surgical History:  Procedure Laterality Date  . BREAST BIOPSY Right 05/28/2018   [place] bx, INVASIVE MAMMARY CARCINOMA WITH LOBULAR FEATURES  . BREAST LUMPECTOMY Right 06/12/2018   [facility], lobular features  . BREAST LUMPECTOMY WITH SENTINEL LYMPH NODE BIOPSY Right 06/12/2018   Procedure: RIGHT BREAST WIDE EXCISION WITH SENTINEL LYMPH NODE BX;  Surgeon: Robert Bellow, MD;  Location: [facility] OR[facility];  Service: General;  Laterality: Right;  . CHOLECYSTECTOMY    . [place]  . JOINT REPLACEMENT    . MASTECTOMY WITH AXILLARY LYMPH NODE DISSECTION Right 09/20/2018   Procedure: MASTECTOMY WITH AXILLARY LYMPH NODE DISSECTION RIGHT;  Surgeon: Robert Bellow, MD;  Location: [facility] OR[facility];  Service: General;  Laterality: Right;  . ovaraian cyst removal Right    Cyst only (NOT OVARY)  . PORTACATH PLACEMENT Left 09/20/2018   Procedure: INSERTION PORT-A-CATH, LEFT;  Surgeon: Robert Bellow, MD;  Location: [facility] OR[facility];  Service: General;  Laterality: Left;  . TOTAL KNEE ARTHROPLASTY Bilateral 05/05/2010    Family History  Problem Relation Age of Onset  . Alcohol abuse Mother        deceased 92  . Diabetes Father   . Breast cancer Maternal Aunt        dx 54s; deceased 2s  . Breast cancer Maternal Aunt        dx 53s; deceased 7s    Social History Social History   Tobacco Use  . Smoking status: Never Smoker  . Smokeless tobacco: Never Used  Substance Use Topics  . Alcohol use: No     Alcohol/week: 0.0 standard drinks  . Drug use: No    No Known Allergies  Current Outpatient Medications  Medication Sig Dispense Refill  . amLODipine (NORVASC) 5 MG tablet TAKE 1 TABLET BY MOUTH  DAILY 90 tablet 1  . anastrozole (ARIMIDEX) 1 MG tablet Take 1 tablet (1 mg total) by mouth daily. 30 tablet 3  . atorvastatin (LIPITOR) 80 MG tablet TAKE 1 TABLET BY MOUTH  DAILY (Patient taking differently: Take 80 mg by mouth every evening. ) 90 tablet 1  . CALCIUM-VITAMIN D PO Take 1 tablet by mouth daily.     . cefadroxil (DURICEF) 500 MG capsule Take 1 capsule (500 mg total) by mouth 2 (two) times daily. 20 capsule 0  . CRANBERRY PO Take 1 tablet by mouth 2 (two) times daily.     Marland Kitchen lidocaine-prilocaine (EMLA) cream Apply to affected area once 30 g 3  . Multiple Vitamins tablet Take 1 tablet by mouth daily.     . Omega-3 Fatty Acids (FISH OIL PO) Take 1 capsule by mouth daily.     . ondansetron (ZOFRAN) 8 MG tablet Take 1 tablet (8 mg total) by mouth 2 (two) times daily as needed for refractory nausea / vomiting. Start on day 3 after chemo. 30 tablet 1  .  oxybutynin (DITROPAN) 5 MG tablet TAKE 1 TABLET BY MOUTH TWO  TIMES DAILY (Patient taking differently: Take 5 mg by mouth 2 (two) times daily. ) 180 tablet 1  . pantoprazole (PROTONIX) 40 MG tablet TAKE 1 TABLET BY MOUTH  DAILY 90 tablet 1  . potassium chloride (K-DUR,KLOR-CON) 10 MEQ tablet TAKE 1 TABLET BY MOUTH  DAILY (Patient taking differently: Take 10 mEq by mouth daily. ) 90 tablet 1  . prochlorperazine (COMPAZINE) 10 MG tablet Take 1 tablet (10 mg total) by mouth every 6 (six) hours as needed (Nausea or vomiting). 30 tablet 1  . sulfamethoxazole-trimethoprim (BACTRIM DS) 800-160 MG tablet Take 1 tablet by mouth 2 (two) times daily. 30 tablet 0  . torsemide (DEMADEX) 5 MG tablet TAKE 1 TABLET BY MOUTH  DAILY 90 tablet 1  . Turmeric 500 MG CAPS Take 1,000 mg by mouth 2 (two) times daily.      No current facility-administered medications  for this visit.     Review of Systems Review of Systems  Constitutional: Negative.     Blood pressure 138/85, pulse 93, temperature 97.7 F (36.5 C), temperature source Temporal, resp. rate 16, height 5\' 4"  (1.626 m), weight 187 lb 3.2 oz (84.9 kg), SpO2 95 %.  Physical Exam Physical Exam Constitutional:      Appearance: Normal appearance.  Chest:    Skin:    General: Skin is warm and dry.  Neurological:     Mental Status: She is alert.     Data Reviewed Culture showed Klebsiella.  Assessment Improvement in local inflammation, exposure of underlying muscle.  Plan At this point, I think the patient will be best served by debridement of the necrotic tissue, drainage of the lateral fluid pocket, replacement of the suction catheter and closure.  If all goes well, she will be able to start her adjuvant chemotherapy later this month.   HPI, Physical Exam, Assessment and Plan have been scribed under the direction and in the presence of Robert Bellow, MD. [name], CMA  The patient is scheduled for surgery at [facility] on 10/30/18 with Dr Bary Castilla. She will have Covid testing done today. She will arrive on 10/30/18 for surgery at 12:00 pm and report to the [facility]. She will have nothing to eat after midnight and will only have clear liquids like apple juice, water, and Gatorade until 10:00 am tomorrow. She will only take the following medications that morning, Amlodipine, Bactrim, and Oxybutynin. The patient is aware of date, time, and instructions.  Documented by Lesly Rubenstein LPN  I have completed the exam and reviewed the above documentation for accuracy and completeness.  I agree with the above.  Haematologist has been used and any errors in dictation or transcription are unintentional.  Hervey Ard, M.D., F.A.C.S.  Forest Gleason Meah Jiron 10/29/2018, 8:55 PM

## 2018-10-30 ENCOUNTER — Other Ambulatory Visit: Payer: Self-pay

## 2018-10-30 ENCOUNTER — Ambulatory Visit: Payer: Medicare Other | Admitting: Anesthesiology

## 2018-10-30 ENCOUNTER — Encounter: Payer: Self-pay | Admitting: Anesthesiology

## 2018-10-30 ENCOUNTER — Encounter
Admission: RE | Disposition: A | Discharge status: Home / Self Care | Payer: Self-pay | Source: Home / Self Care | Attending: General Surgery

## 2018-10-30 ENCOUNTER — Ambulatory Visit
Admission: RE | Admit: 2018-10-30 | Discharge: 2018-10-30 | Disposition: A | Discharge status: Home / Self Care | Payer: Medicare Other | Attending: General Surgery | Admitting: General Surgery

## 2018-10-30 ENCOUNTER — Other Ambulatory Visit: Payer: Medicare Other

## 2018-10-30 DIAGNOSIS — L7682 Other postprocedural complications of skin and subcutaneous tissue: Secondary | ICD-10-CM | POA: Insufficient documentation

## 2018-10-30 DIAGNOSIS — Z853 Personal history of malignant neoplasm of breast: Secondary | ICD-10-CM | POA: Insufficient documentation

## 2018-10-30 DIAGNOSIS — S2190XA Unspecified open wound of unspecified part of thorax, initial encounter: Secondary | ICD-10-CM | POA: Diagnosis not present

## 2018-10-30 DIAGNOSIS — M1990 Unspecified osteoarthritis, unspecified site: Secondary | ICD-10-CM | POA: Insufficient documentation

## 2018-10-30 DIAGNOSIS — T86822 Skin graft (allograft) (autograft) infection: Secondary | ICD-10-CM | POA: Diagnosis not present

## 2018-10-30 DIAGNOSIS — Z79811 Long term (current) use of aromatase inhibitors: Secondary | ICD-10-CM | POA: Diagnosis not present

## 2018-10-30 DIAGNOSIS — Z1159 Encounter for screening for other viral diseases: Secondary | ICD-10-CM | POA: Diagnosis not present

## 2018-10-30 DIAGNOSIS — I10 Essential (primary) hypertension: Secondary | ICD-10-CM | POA: Insufficient documentation

## 2018-10-30 DIAGNOSIS — K219 Gastro-esophageal reflux disease without esophagitis: Secondary | ICD-10-CM | POA: Insufficient documentation

## 2018-10-30 DIAGNOSIS — Z79899 Other long term (current) drug therapy: Secondary | ICD-10-CM | POA: Diagnosis not present

## 2018-10-30 DIAGNOSIS — E785 Hyperlipidemia, unspecified: Secondary | ICD-10-CM | POA: Diagnosis not present

## 2018-10-30 DIAGNOSIS — Z803 Family history of malignant neoplasm of breast: Secondary | ICD-10-CM | POA: Insufficient documentation

## 2018-10-30 HISTORY — PX: INCISION AND DRAINAGE OF WOUND: SHX1803

## 2018-10-30 SURGERY — IRRIGATION AND DEBRIDEMENT WOUND
Anesthesia: General | Site: Breast | Laterality: Right

## 2018-10-30 MED ORDER — FENTANYL CITRATE (PF) 100 MCG/2ML IJ SOLN: 25.0000 ug | INTRAMUSCULAR | Status: DC | PRN

## 2018-10-30 MED ORDER — LIDOCAINE HCL (CARDIAC) PF 100 MG/5ML IV SOSY
PREFILLED_SYRINGE | INTRAVENOUS | Status: DC | PRN
  Administered 2018-10-30: 50 mg via INTRAVENOUS

## 2018-10-30 MED ORDER — ONDANSETRON HCL 4 MG/2ML IJ SOLN
INTRAMUSCULAR | Status: DC | PRN
  Administered 2018-10-30: 4 mg via INTRAVENOUS

## 2018-10-30 MED ORDER — GLYCOPYRROLATE 0.2 MG/ML IJ SOLN
INTRAMUSCULAR | Status: DC | PRN
  Administered 2018-10-30: 0.2 mg via INTRAVENOUS

## 2018-10-30 MED ORDER — DEXAMETHASONE SODIUM PHOSPHATE 10 MG/ML IJ SOLN
INTRAMUSCULAR | Status: DC | PRN
  Administered 2018-10-30: 5 mg via INTRAVENOUS

## 2018-10-30 MED ORDER — FENTANYL CITRATE (PF) 100 MCG/2ML IJ SOLN
INTRAMUSCULAR | Status: AC
  Filled 2018-10-30: qty 2

## 2018-10-30 MED ORDER — SULFAMETHOXAZOLE-TRIMETHOPRIM 800-160 MG PO TABS: 1.0000 | ORAL_TABLET | Freq: Two times a day (BID) | ORAL | 0 refills | Status: DC

## 2018-10-30 MED ORDER — LACTATED RINGERS IV SOLN
INTRAVENOUS | Status: DC
  Administered 2018-10-30 (×2): via INTRAVENOUS

## 2018-10-30 MED ORDER — ACETAMINOPHEN 10 MG/ML IV SOLN
INTRAVENOUS | Status: AC
  Filled 2018-10-30: qty 100

## 2018-10-30 MED ORDER — ONDANSETRON HCL 4 MG/2ML IJ SOLN: 4.0000 mg | Freq: Once | INTRAMUSCULAR | Status: DC | PRN

## 2018-10-30 MED ORDER — MIDAZOLAM HCL 2 MG/2ML IJ SOLN
INTRAMUSCULAR | Status: AC
  Filled 2018-10-30: qty 2

## 2018-10-30 MED ORDER — PROPOFOL 10 MG/ML IV BOLUS
INTRAVENOUS | Status: AC
  Filled 2018-10-30: qty 40

## 2018-10-30 MED ORDER — ACETAMINOPHEN 10 MG/ML IV SOLN
INTRAVENOUS | Status: DC | PRN
  Administered 2018-10-30: 1000 mg via INTRAVENOUS

## 2018-10-30 MED ORDER — CEFAZOLIN SODIUM-DEXTROSE 2-3 GM-%(50ML) IV SOLR
INTRAVENOUS | Status: DC | PRN
  Administered 2018-10-30: 2 g via INTRAVENOUS

## 2018-10-30 MED ORDER — PROPOFOL 10 MG/ML IV BOLUS
INTRAVENOUS | Status: DC | PRN
  Administered 2018-10-30: 140 mg via INTRAVENOUS

## 2018-10-30 MED ORDER — 0.9 % SODIUM CHLORIDE (POUR BTL) OPTIME
TOPICAL | Status: DC | PRN
  Administered 2018-10-30: 1000 mL

## 2018-10-30 MED ORDER — FENTANYL CITRATE (PF) 100 MCG/2ML IJ SOLN
INTRAMUSCULAR | Status: DC | PRN
  Administered 2018-10-30 (×4): 50 ug via INTRAVENOUS

## 2018-10-30 MED ORDER — PIPERACILLIN-TAZOBACTAM 3.375 G IVPB 30 MIN
3.3750 g | Freq: Once | INTRAVENOUS | Status: DC
  Filled 2018-10-30: qty 50

## 2018-10-30 SURGICAL SUPPLY — 26 items
BINDER BREAST LRG (GAUZE/BANDAGES/DRESSINGS) ×2 IMPLANT
CANISTER SUCT 1200ML W/VALVE (MISCELLANEOUS) ×3 IMPLANT
COVER WAND RF STERILE (DRAPES) ×3 IMPLANT
DRAPE LAPAROTOMY 100X77 ABD (DRAPES) ×3 IMPLANT
DRAPE SHEET LG 3/4 BI-LAMINATE (DRAPES) ×3 IMPLANT
DRSG GAUZE FLUFF 36X18 (GAUZE/BANDAGES/DRESSINGS) ×2 IMPLANT
DRSG TEGADERM 4X4.75 (GAUZE/BANDAGES/DRESSINGS) IMPLANT
DRSG TELFA 3X8 NADH (GAUZE/BANDAGES/DRESSINGS) ×3 IMPLANT
DRSG TELFA 4X3 1S NADH ST (GAUZE/BANDAGES/DRESSINGS) IMPLANT
ELECT REM PT RETURN 9FT ADLT (ELECTROSURGICAL) ×3
ELECTRODE REM PT RTRN 9FT ADLT (ELECTROSURGICAL) ×1 IMPLANT
GAUZE SPONGE 4X4 12PLY STRL (GAUZE/BANDAGES/DRESSINGS) IMPLANT
GLOVE BIO SURGEON STRL SZ7.5 (GLOVE) ×3 IMPLANT
GLOVE INDICATOR 8.0 STRL GRN (GLOVE) ×3 IMPLANT
GOWN STRL REUS W/ TWL LRG LVL3 (GOWN DISPOSABLE) ×2 IMPLANT
GOWN STRL REUS W/TWL LRG LVL3 (GOWN DISPOSABLE) ×4
KIT TURNOVER KIT A (KITS) ×3 IMPLANT
NS IRRIG 500ML POUR BTL (IV SOLUTION) ×3 IMPLANT
PACK BASIN MINOR ARMC (MISCELLANEOUS) ×3 IMPLANT
PAD DRESSING TELFA 3X8 NADH (GAUZE/BANDAGES/DRESSINGS) IMPLANT
PAD PREP 24X41 OB/GYN DISP (PERSONAL CARE ITEMS) ×3 IMPLANT
STAPLER SKIN PROX 35W (STAPLE) ×2 IMPLANT
SUT ETHILON 4-0 (SUTURE) ×2
SUT ETHILON 4-0 FS2 18XMFL BLK (SUTURE) ×1
SUT MON AB 2-0 CT1 36 (SUTURE) ×4 IMPLANT
SUTURE ETHLN 4-0 FS2 18XMF BLK (SUTURE) ×1 IMPLANT

## 2018-10-30 NOTE — Anesthesia Procedure Notes (Signed)
Procedure Name: LMA Insertion Date/Time: 10/30/2018 2:05 PM Performed by: Chanetta Marshall, CRNA Pre-anesthesia Checklist: Patient identified, Emergency Drugs available, Suction available and Patient being monitored Patient Re-evaluated:Patient Re-evaluated prior to induction Oxygen Delivery Method: Circle system utilized Preoxygenation: Pre-oxygenation with 100% oxygen Induction Type: IV induction Ventilation: Mask ventilation without difficulty LMA Size: 4.0 Tube type: Oral Number of attempts: 1 Placement Confirmation: ETT inserted through vocal cords under direct vision,  positive ETCO2,  breath sounds checked- equal and bilateral and CO2 detector Tube secured with: Tape Dental Injury: Teeth and Oropharynx as per pre-operative assessment

## 2018-10-30 NOTE — Anesthesia Preprocedure Evaluation (Signed)
Anesthesia Evaluation  Patient identified by MRN, date of birth, ID band Patient awake    Reviewed: Allergy & Precautions, NPO status , Patient's Chart, lab work & pertinent test results, reviewed documented beta blocker date and time   Airway Mallampati: III  TM Distance: >3 FB     Dental  (+) Chipped   Pulmonary           Cardiovascular hypertension, Pt. on medications      Neuro/Psych    GI/Hepatic GERD  Controlled,  Endo/Other    Renal/GU      Musculoskeletal  (+) Arthritis ,   Abdominal   Peds  Hematology   Anesthesia Other Findings Obese. sats 95%. EF 60-65. EKG ok.  Reproductive/Obstetrics                             Anesthesia Physical Anesthesia Plan  ASA: III  Anesthesia Plan: General   Post-op Pain Management:    Induction: Intravenous  PONV Risk Score and Plan:   Airway Management Planned: LMA  Additional Equipment:   Intra-op Plan:   Post-operative Plan:   Informed Consent: I have reviewed the patients History and Physical, chart, labs and discussed the procedure including the risks, benefits and alternatives for the proposed anesthesia with the patient or authorized representative who has indicated his/her understanding and acceptance.       Plan Discussed with: CRNA  Anesthesia Plan Comments:         Anesthesia Quick Evaluation

## 2018-10-30 NOTE — Op Note (Signed)
Preoperative diagnosis: Flap necrosis post mastectomy.  Postoperative diagnosis: Same.  Operative procedure: Debridement mastectomy flap.  Primary closure.  Operating Surgeon: Charlie Pitter, MD.  Anesthesia: General by LMA.  Estimated blood loss: 50 cc.  Clinical note: This 78 year old woman underwent a right modified radical mastectomy for invasive lobular carcinoma on Sep 20, 2018.  She is developed necrosis in the medial portion of the wound and loculated fluid laterally.  She was felt to be a candidate for debridement.  Operative note: The patient received Ancef 2 g intravenously on induction of anesthesia.  The right chest wall was cleansed with Betadine solution and draped.  The old area of necrotic tissue was sharply excised.  The flaps were then elevated to fully mobilized the area for closure.  The membrane on the superficial aspect of the flap was debrided with a gauze sponge.  Hemostasis was with electrocautery.  The flaps were of normal thickness medially and thickened with inflammatory changes laterally.  A new 15 [place] Blake drain was brought out through a stab wound incision in the inferior lateral aspect of the flap.  This was anchored in place with 3-0 nylon.  The deep tissue was then approximated with interrupted 2-0 Monocryl figure-of-eight sutures.  On the medial third this was a deep dermal layer suture.  A suture second layer closure on the lateral two thirds of the wound was completed and the skin was closed with staples.  The flaps appeared healthy and there was no skin blanching noted.  Telfa, fluff gauze and a compressive wrap were applied.  Patient tolerated the procedure well and was taken to recovery in stable condition.

## 2018-10-30 NOTE — H&P (Signed)
No change in clinical history or exam. For debridement of necrotic mastectomy flap.

## 2018-10-30 NOTE — Transfer of Care (Signed)
Immediate Anesthesia Transfer of Care Note  Patient: Theresa Reid  Procedure(s) Performed: IRRIGATION AND DEBRIDEMENT RIGHT CHEST WALL WOUND (Right Breast)  Patient Location: PACU  Anesthesia Type:General  Level of Consciousness: awake, alert  and oriented  Airway & Oxygen Therapy: Patient Spontanous Breathing and Patient connected to face mask oxygen  Post-op Assessment: Report given to RN and Post -op Vital signs reviewed and stable  Post vital signs: Reviewed and stable  Last Vitals:  Vitals Value Taken Time  BP    Temp    Pulse    Resp    SpO2      Last Pain:  Vitals:   10/30/18 1239  TempSrc: Oral  PainSc: 0-No pain         Complications: No apparent anesthesia complications

## 2018-10-30 NOTE — Discharge Instructions (Signed)

## 2018-10-30 NOTE — Anesthesia Post-op Follow-up Note (Signed)
Anesthesia QCDR form completed.        

## 2018-10-31 ENCOUNTER — Encounter: Payer: Self-pay | Admitting: General Surgery

## 2018-10-31 NOTE — Anesthesia Postprocedure Evaluation (Signed)
Anesthesia Post Note  Patient: Theresa Reid  Procedure(s) Performed: IRRIGATION AND DEBRIDEMENT RIGHT CHEST WALL WOUND (Right Breast)  Patient location during evaluation: PACU Anesthesia Type: General Level of consciousness: awake and alert Pain management: pain level controlled Vital Signs Assessment: post-procedure vital signs reviewed and stable Respiratory status: spontaneous breathing, nonlabored ventilation, respiratory function stable and patient connected to nasal cannula oxygen Cardiovascular status: blood pressure returned to baseline and stable Postop Assessment: no apparent nausea or vomiting Anesthetic complications: no     Last Vitals:  Vitals:   10/30/18 1537 10/30/18 1603  BP: (!) 128/54 (!) 116/45  Pulse: 77 74  Resp: 16 16  Temp: 36.4 C   SpO2: 96% 96%    Last Pain:  Vitals:   10/31/18 0828  TempSrc:   PainSc: 0-No pain                 [name] S

## 2018-11-01 LAB — SURGICAL PATHOLOGY

## 2018-11-05 ENCOUNTER — Encounter: Payer: Self-pay | Admitting: General Surgery

## 2018-11-05 ENCOUNTER — Ambulatory Visit (INDEPENDENT_AMBULATORY_CARE_PROVIDER_SITE_OTHER): Payer: Medicare Other | Admitting: General Surgery

## 2018-11-05 ENCOUNTER — Other Ambulatory Visit: Payer: Self-pay

## 2018-11-05 ENCOUNTER — Telehealth: Payer: Self-pay | Admitting: *Deleted

## 2018-11-05 VITALS — BP 132/78 | HR 72 | Temp 97.3°F | Resp 16 | Ht 64.0 in | Wt 187.0 lb

## 2018-11-05 DIAGNOSIS — C50411 Malignant neoplasm of upper-outer quadrant of right female breast: Secondary | ICD-10-CM

## 2018-11-05 DIAGNOSIS — Z170 Estrogen receptor positive status [ER+]: Secondary | ICD-10-CM

## 2018-11-05 NOTE — Patient Instructions (Addendum)
We will refer you to occupational therapy for the swelling in your right arm. Someone from their office will call to schedule an appointment.    We will refer you to occupational therapy for the swelling in your right arm. Someone from their office will call to schedule an appointment.

## 2018-11-05 NOTE — Progress Notes (Addendum)
Patient ID: Theresa Reid, female   DOB: Jul 18, 1940, 78 y.o.   MRN: [id number]  Chief Complaint  Patient presents with  . Routine Post Op    wound check    HPI Theresa Reid is a 78 y.o. female.  Here today for right breast mastectomy wound check evaluation after scar revision completed 6 days ago.  The patient reports no pain.  She is aggravated by the JP drain where the chest binder rubs across it.  HPI  Past Medical History:  Diagnosis Date  . Arthritis   . Breast cancer ([place])   . Diverticulitis   . Family history of breast cancer   . GERD (gastroesophageal reflux disease)   . Hyperlipidemia   . Hypertension     Past Surgical History:  Procedure Laterality Date  . BREAST BIOPSY Right 05/28/2018   [place] bx, INVASIVE MAMMARY CARCINOMA WITH LOBULAR FEATURES  . BREAST LUMPECTOMY Right 06/12/2018   [facility], lobular features  . BREAST LUMPECTOMY WITH SENTINEL LYMPH NODE BIOPSY Right 06/12/2018   Procedure: RIGHT BREAST WIDE EXCISION WITH SENTINEL LYMPH NODE BX;  Surgeon: Robert Bellow, MD;  Location: [facility] ORS;  Service: General;  Laterality: Right;  . CHOLECYSTECTOMY    . [place]  . INCISION AND DRAINAGE OF WOUND Right 10/30/2018   Procedure: IRRIGATION AND DEBRIDEMENT RIGHT CHEST WALL WOUND;  Surgeon: Robert Bellow, MD;  Location: [facility] ORS;  Service: General;  Laterality: Right;  . JOINT REPLACEMENT    . MASTECTOMY WITH AXILLARY LYMPH NODE DISSECTION Right 09/20/2018   Procedure: MASTECTOMY WITH AXILLARY LYMPH NODE DISSECTION RIGHT;  Surgeon: Robert Bellow, MD;  Location: [facility] ORS;  Service: General;  Laterality: Right;  . ovaraian cyst removal Right    Cyst only (NOT OVARY)  . PORTACATH PLACEMENT Left 09/20/2018   Procedure: INSERTION PORT-A-CATH, LEFT;  Surgeon: Robert Bellow, MD;  Location: [facility] ORS;  Service: General;  Laterality: Left;  . TOTAL KNEE ARTHROPLASTY Bilateral 05/05/2010    Family History  Problem Relation Age of Onset  .  Alcohol abuse Mother        deceased 40  . Diabetes Father   . Breast cancer Maternal Aunt        dx 82s; deceased 72s  . Breast cancer Maternal Aunt        dx 55s; deceased 32s    Social History Social History   Tobacco Use  . Smoking status: Never Smoker  . Smokeless tobacco: Never Used  Substance Use Topics  . Alcohol use: No    Alcohol/week: 0.0 standard drinks  . Drug use: No    No Known Allergies  Current Outpatient Medications  Medication Sig Dispense Refill  . amLODipine (NORVASC) 5 MG tablet TAKE 1 TABLET BY MOUTH  DAILY 90 tablet 1  . anastrozole (ARIMIDEX) 1 MG tablet Take 1 tablet (1 mg total) by mouth daily. 30 tablet 3  . atorvastatin (LIPITOR) 80 MG tablet TAKE 1 TABLET BY MOUTH  DAILY (Patient taking differently: Take 80 mg by mouth every evening. ) 90 tablet 1  . CALCIUM-VITAMIN D PO Take 1 tablet by mouth daily.     . cefadroxil (DURICEF) 500 MG capsule Take 1 capsule (500 mg total) by mouth 2 (two) times daily. 20 capsule 0  . CRANBERRY PO Take 1 tablet by mouth 2 (two) times daily.     Marland Kitchen lidocaine-prilocaine (EMLA) cream Apply to affected area once 30 g 3  . Multiple Vitamins tablet Take  1 tablet by mouth daily.     . Omega-3 Fatty Acids (FISH OIL PO) Take 1 capsule by mouth daily.     . ondansetron (ZOFRAN) 8 MG tablet Take 1 tablet (8 mg total) by mouth 2 (two) times daily as needed for refractory nausea / vomiting. Start on day 3 after chemo. 30 tablet 1  . oxybutynin (DITROPAN) 5 MG tablet TAKE 1 TABLET BY MOUTH TWO  TIMES DAILY (Patient taking differently: Take 5 mg by mouth 2 (two) times daily. ) 180 tablet 1  . pantoprazole (PROTONIX) 40 MG tablet TAKE 1 TABLET BY MOUTH  DAILY 90 tablet 1  . potassium chloride (K-DUR,KLOR-CON) 10 MEQ tablet TAKE 1 TABLET BY MOUTH  DAILY (Patient taking differently: Take 10 mEq by mouth daily. ) 90 tablet 1  . prochlorperazine (COMPAZINE) 10 MG tablet Take 1 tablet (10 mg total) by mouth every 6 (six) hours as needed  (Nausea or vomiting). 30 tablet 1  . sulfamethoxazole-trimethoprim (BACTRIM DS) 800-160 MG tablet Take 1 tablet by mouth 2 (two) times daily. 20 tablet 0  . torsemide (DEMADEX) 5 MG tablet TAKE 1 TABLET BY MOUTH  DAILY 90 tablet 1  . Turmeric 500 MG CAPS Take 1,000 mg by mouth 2 (two) times daily.     Marland Kitchen sulfamethoxazole-trimethoprim (BACTRIM DS) 800-160 MG tablet Take 1 tablet by mouth 2 (two) times daily. 30 tablet 0   Current Facility-Administered Medications  Medication Dose Route Frequency Provider Last Rate Last Dose  . piperacillin-tazobactam (ZOSYN) IVPB 3.375 g  3.375 g Intravenous Once Zamariya Neal, Forest Gleason, MD        Review of Systems Review of Systems  Constitutional: Negative.   Respiratory: Negative.   Cardiovascular: Negative.     Blood pressure 132/78, pulse 72, temperature (!) 97.3 F (36.3 C), temperature source Temporal, resp. rate 16, height 5\' 4"  (1.626 m), weight 187 lb (84.8 kg), SpO2 96 %.  Physical Exam Physical Exam Constitutional:      Appearance: She is well-developed.  Eyes:     Conjunctiva/sclera: Conjunctivae normal.  Neck:     Musculoskeletal: Normal range of motion.  Cardiovascular:     Rate and Rhythm: Normal rate and regular rhythm.     Heart sounds: Normal heart sounds.  Pulmonary:     Effort: Pulmonary effort is normal.     Breath sounds: Normal breath sounds.  Chest:    Skin:    General: Skin is warm and dry.  Neurological:     Mental Status: She is alert and oriented to person, place, and time.   The patient has developed significant swelling in the right upper extremity consistent with early lymphedema.  Half of the staples have been removed.  Data Reviewed The patient's port laced at the end of May had not been flushed since placement.  The area was cleansed with ChloraPrep.  Port was accessed without difficulty.  It slowly aspirated and easily irrigated.  Tegaderm dressing applied. DIAGNOSIS:  A. SKIN AND SUBCUTANEOUS TISSUE,  RIGHT CHEST WALL; DEBRIDEMENT:  - SCAR TISSUE AND FIBROADIPOSE WITH NECROSIS AND ACUTE SUPPURATIVE  INFLAMMATION, CONSISTENT WITH ABSCESS.  - NO DEFINITE SKIN ORMAMMARY ELEMENTS IDENTIFIED.  - NEGATIVE FOR MALIGNANCY.   Assessment Doing well post scar revision.  Plan  We will refer you to occupational therapy for the swelling in your right arm. Someone from their office will call to schedule an appointment.   Please call Dr.Dereck Agerton 11/08/2018 to let him know about the drainage.  Please finish all  antibiotics.     HPI, Physical Exam, Assessment and Plan have been scribed under the direction and in the presence of Robert Bellow, MD. [name], CMA  I have completed the exam and reviewed the above documentation for accuracy and completeness.  I agree with the above.  Haematologist has been used and any errors in dictation or transcription are unintentional.  Hervey Ard, M.D., F.A.C.S.  Forest Gleason Porsha Skilton 11/05/2018, 1:50 PM

## 2018-11-05 NOTE — Telephone Encounter (Signed)
Called the pt. And no answer and no way of leaving message. Called daughter tiffany and asked her if I could cancel chemo 7/20 Dr. Bary Castilla states that pt. Will need 2 more weeks before starting treatment. Dr. Janese Banks would like to have appt for pt. In 2 weeks and then make plan for chemo and she may change the chemo to a different drug. Tiffany is fine with this and she will call her mom and let her know.

## 2018-11-07 ENCOUNTER — Encounter: Payer: Medicare Other | Admitting: General Surgery

## 2018-11-08 ENCOUNTER — Telehealth: Payer: Self-pay | Admitting: General Surgery

## 2018-11-08 NOTE — Telephone Encounter (Signed)
Spoke with patient about drain amounts. For Wednesday she had a total of 35cc, for Thursday she had a total of 30 cc, and so far for Friday morning she has had 15 cc. Drain not ready to come out. She will come in for a post op visit on Monday with Dr Dahlia Byes.

## 2018-11-08 NOTE — Telephone Encounter (Signed)
Patient was calling about her drain. Please call patient and advise.

## 2018-11-11 ENCOUNTER — Ambulatory Visit (INDEPENDENT_AMBULATORY_CARE_PROVIDER_SITE_OTHER): Payer: Medicare Other | Admitting: Surgery

## 2018-11-11 ENCOUNTER — Ambulatory Visit: Payer: Medicare Other | Admitting: Oncology

## 2018-11-11 ENCOUNTER — Encounter: Payer: Self-pay | Admitting: Surgery

## 2018-11-11 ENCOUNTER — Ambulatory Visit: Payer: Medicare Other

## 2018-11-11 ENCOUNTER — Other Ambulatory Visit: Payer: Self-pay

## 2018-11-11 ENCOUNTER — Other Ambulatory Visit: Payer: Medicare Other

## 2018-11-11 VITALS — BP 134/77 | HR 93 | Temp 97.7°F | Wt 186.0 lb

## 2018-11-11 DIAGNOSIS — Z09 Encounter for follow-up examination after completed treatment for conditions other than malignant neoplasm: Secondary | ICD-10-CM

## 2018-11-11 NOTE — Patient Instructions (Signed)
You may shower Do not submerge your incision The steri strips will fall off in about a week Follow up in a week

## 2018-11-11 NOTE — Progress Notes (Signed)
06/12/18 Lumpectomy w Positive Margins, MRI showing extensive Dz and pt elected for mastectomy by Dr. Bary Castilla 09/20/18 Right MR mastectomy for invasive lobular CA complicated by flap necrosis  10/30/18 Debridement mastectomy flaps with closure  She has been doing well, completed a/bs Drain is averaging around 60cc day No fevers no chills  PE NAD Flaps are viable, no necrosis, staples removed. Drain serous fluid   A/p Doing well rtc next week hopefully may remove drain No other issues at this time Might proceed w chemo next week from our point of view

## 2018-11-12 ENCOUNTER — Other Ambulatory Visit: Payer: Self-pay | Admitting: Family Medicine

## 2018-11-12 DIAGNOSIS — E876 Hypokalemia: Secondary | ICD-10-CM

## 2018-11-15 ENCOUNTER — Other Ambulatory Visit: Payer: Self-pay

## 2018-11-16 ENCOUNTER — Other Ambulatory Visit: Payer: Self-pay | Admitting: Oncology

## 2018-11-18 ENCOUNTER — Telehealth: Payer: Self-pay | Admitting: *Deleted

## 2018-11-18 ENCOUNTER — Ambulatory Visit (INDEPENDENT_AMBULATORY_CARE_PROVIDER_SITE_OTHER): Payer: Medicare Other | Admitting: Surgery

## 2018-11-18 ENCOUNTER — Encounter: Payer: Self-pay | Admitting: Oncology

## 2018-11-18 ENCOUNTER — Encounter: Payer: Self-pay | Admitting: Surgery

## 2018-11-18 ENCOUNTER — Encounter: Payer: Self-pay | Admitting: *Deleted

## 2018-11-18 ENCOUNTER — Other Ambulatory Visit: Payer: Self-pay

## 2018-11-18 ENCOUNTER — Inpatient Hospital Stay: Payer: Medicare Other | Attending: Oncology | Admitting: Oncology

## 2018-11-18 VITALS — BP 139/78 | HR 94 | Temp 98.6°F | Resp 18 | Wt 187.2 lb

## 2018-11-18 VITALS — BP 139/82 | HR 97 | Temp 97.2°F | Ht 65.0 in | Wt 187.0 lb

## 2018-11-18 DIAGNOSIS — Z09 Encounter for follow-up examination after completed treatment for conditions other than malignant neoplasm: Secondary | ICD-10-CM

## 2018-11-18 DIAGNOSIS — Z9011 Acquired absence of right breast and nipple: Secondary | ICD-10-CM | POA: Insufficient documentation

## 2018-11-18 DIAGNOSIS — Z79899 Other long term (current) drug therapy: Secondary | ICD-10-CM | POA: Diagnosis not present

## 2018-11-18 DIAGNOSIS — Z7189 Other specified counseling: Secondary | ICD-10-CM

## 2018-11-18 DIAGNOSIS — Z170 Estrogen receptor positive status [ER+]: Secondary | ICD-10-CM | POA: Diagnosis not present

## 2018-11-18 DIAGNOSIS — C50911 Malignant neoplasm of unspecified site of right female breast: Secondary | ICD-10-CM | POA: Diagnosis not present

## 2018-11-18 DIAGNOSIS — I10 Essential (primary) hypertension: Secondary | ICD-10-CM | POA: Diagnosis not present

## 2018-11-18 DIAGNOSIS — Z803 Family history of malignant neoplasm of breast: Secondary | ICD-10-CM | POA: Diagnosis not present

## 2018-11-18 DIAGNOSIS — Z79811 Long term (current) use of aromatase inhibitors: Secondary | ICD-10-CM

## 2018-11-18 MED ORDER — AMOXICILLIN-POT CLAVULANATE 875-125 MG PO TABS: 1.0000 | ORAL_TABLET | Freq: Two times a day (BID) | ORAL | 0 refills | Status: DC

## 2018-11-18 NOTE — Progress Notes (Signed)
DISCONTINUE ON PATHWAY REGIMEN - Breast     A cycle is every 21 days:     Docetaxel      Cyclophosphamide   **Always confirm dose/schedule in your pharmacy ordering system**  REASON: Other Reason PRIOR TREATMENT: [id number]: TC - Docetaxel, Cyclophosphamide q21 Days x 4 Cycles TREATMENT RESPONSE: N/A - Adjuvant Therapy    Patient Characteristics: Postoperative without Neoadjuvant Therapy (Pathologic Staging) Therapeutic Status: Postoperative without Neoadjuvant Therapy (Pathologic Staging) AJCC Grade: G2 AJCC N Category: pN1a AJCC M Category: cM0 ER Status: Positive (+) AJCC 8 Stage Grouping: IB HER2 Status: Negative (-) Oncotype Dx Recurrence Score: Ordered Other Genomic Test AJCC T Category: pT2 PR Status: Positive (+)

## 2018-11-18 NOTE — Progress Notes (Signed)
START OFF PATHWAY REGIMEN - Breast   OFF00010:Paclitaxel 80 mg/m2 Weekly:   Administer weekly:     Paclitaxel   **Always confirm dose/schedule in your pharmacy ordering system**  Patient Characteristics: Postoperative without Neoadjuvant Therapy (Pathologic Staging), Invasive Disease, Adjuvant Therapy, HER2 Negative/Unknown/Equivocal, ER Positive, Node Positive, Node Positive (1-3), MammaPrint(R) Ordered, High Genomic Risk Therapeutic Status: Postoperative without Neoadjuvant Therapy (Pathologic Staging) AJCC Grade: G2 AJCC N Category: pN1a AJCC M Category: cM0 ER Status: Positive (+) AJCC 8 Stage Grouping: IB HER2 Status: Negative (-) Oncotype Dx Recurrence Score: Ordered Other Genomic Test AJCC T Category: pT2 PR Status: Positive (+) Has this patient completed genomic testing<= Yes - MammaPrint(R) MammaPrint(R) Score: High Genomic Risk Intent of Therapy: Curative Intent, Discussed with Patient

## 2018-11-18 NOTE — Patient Instructions (Signed)
Follow up next week Start your antibiotics We have sent a message to Dr Janese Banks about holding your Chemo for another week.

## 2018-11-18 NOTE — Telephone Encounter (Signed)
Dr. Janese Banks told me about it and I changed her appt to 8/10 and I sent my chart message with new date and time

## 2018-11-18 NOTE — Telephone Encounter (Signed)
If patient is getting dose dense AC, is she on Anastrozole at same time?

## 2018-11-18 NOTE — Telephone Encounter (Signed)
I am seeing her today. I am not renewing her anastrozole

## 2018-11-18 NOTE — Telephone Encounter (Signed)
Dr Corlis Leak office called reporting tha the saw patient this morning and started her on Augmenting 875 mg and wants [place] to delay her chemotherapy start date by a week (she is scheduled for 8/3)

## 2018-11-18 NOTE — Progress Notes (Signed)
Pt here for follow up to discuss chemotherapy. States is feeling well. Offers no complaints.

## 2018-11-19 ENCOUNTER — Ambulatory Visit: Payer: Medicare Other | Attending: General Surgery | Admitting: Occupational Therapy

## 2018-11-19 ENCOUNTER — Encounter: Payer: Self-pay | Admitting: Occupational Therapy

## 2018-11-19 DIAGNOSIS — I972 Postmastectomy lymphedema syndrome: Secondary | ICD-10-CM | POA: Diagnosis not present

## 2018-11-19 DIAGNOSIS — L905 Scar conditions and fibrosis of skin: Secondary | ICD-10-CM | POA: Diagnosis not present

## 2018-11-19 NOTE — Therapy (Signed)
[facility] [address], [place], [address] Phone: [phone]   Fax:  [phone]  Occupational Therapy Evaluation  Patient Details  Name: Theresa Reid MRN: [id number] Date of Birth: July 25, 1940 Referring Provider (OT) Dr Janese Banks, Dr Byrnett;Dr  Dahlia Byes   Encounter Date: 11/19/2018  OT End of Session - 11/19/18 1732    Visit Number  1    Number of Visits  12    Date for OT Re-Evaluation  12/31/18    OT Start Time  1301    OT Stop Time  1354    OT Time Calculation (min)  53 min    Activity Tolerance  Patient tolerated treatment well    Behavior During Therapy  [facility] for tasks assessed/performed       Past Medical History:  Diagnosis Date  . Arthritis   . Breast cancer ([place])   . Diverticulitis   . Family history of breast cancer   . GERD (gastroesophageal reflux disease)   . Hyperlipidemia   . Hypertension     Past Surgical History:  Procedure Laterality Date  . BREAST BIOPSY Right 05/28/2018   [place] bx, INVASIVE MAMMARY CARCINOMA WITH LOBULAR FEATURES  . BREAST LUMPECTOMY Right 06/12/2018   [facility], lobular features  . BREAST LUMPECTOMY WITH SENTINEL LYMPH NODE BIOPSY Right 06/12/2018   Procedure: RIGHT BREAST WIDE EXCISION WITH SENTINEL LYMPH NODE BX;  Surgeon: Robert Bellow, MD;  Location: [facility] ORS;  Service: General;  Laterality: Right;  . CHOLECYSTECTOMY    . [place]  . INCISION AND DRAINAGE OF WOUND Right 10/30/2018   Procedure: IRRIGATION AND DEBRIDEMENT RIGHT CHEST WALL WOUND;  Surgeon: Robert Bellow, MD;  Location: [facility] ORS;  Service: General;  Laterality: Right;  . JOINT REPLACEMENT    . MASTECTOMY WITH AXILLARY LYMPH NODE DISSECTION Right 09/20/2018   Procedure: MASTECTOMY WITH AXILLARY LYMPH NODE DISSECTION RIGHT;  Surgeon: Robert Bellow, MD;  Location: [facility] ORS;  Service: General;  Laterality: Right;  . ovaraian cyst removal Right    Cyst only (NOT OVARY)  .  PORTACATH PLACEMENT Left 09/20/2018   Procedure: INSERTION PORT-A-CATH, LEFT;  Surgeon: Robert Bellow, MD;  Location: [facility] ORS;  Service: General;  Laterality: Left;  . TOTAL KNEE ARTHROPLASTY Bilateral 05/05/2010    There were no vitals filed for this visit.  Subjective Assessment - 11/19/18 1455    Subjective   My arm started swelling after the surgery I had on the 8th of July - around the 10th - and it just stay now the same size - Dr Bary Castilla send me to you - seen the surgeon yesterday and last drain come out and now on antibiotics and they going to hold my chemo until the 10th Aug    Pertinent History  Patient had her original lumpectomy and sentinel lymph node biopsy back in February 2020 R breast   She then had an MRI which showed more areas of enhancement and was taken back to the OR for mastectomy and axillary lymph node dissection on 09/20/2018 which showed 15 of the lymph nodes were also positive for malignancy and this upstaged her to stage IIIa disease.  Plan was to offer her adjuvant chemotherapy following this.  However her mastectomy course was complicated by flap necrosis requiring debridement antibiotics and she had to be also taken to the OR on 10/30/2018.  Her wound has not completely healed yet and she still has a drain in  place.  She seen  Dr. Dahlia Byes on 11/18/2018.  There is concern for early cellulitis of her chest wall.  Dr. Dahlia Byes after his visit started  patient on Augmentin for 2 more weeks which further delays her chemotherapy. Lymphedema in R UE per pt developed after surgery on 10/30/2018 - stayed the same - not going down - denies pain    Patient Stated Goals  I want to get sleeve so my arm don't  get big and to  prevent infection    Currently in Pain?  No/denies        [facility] OT Assessment - 11/19/18 0001      Assessment   Medical Diagnosis  R breast CA with mastectomy     Referring Provider (OT)  Avon Gully    Onset Date/Surgical Date  10/30/18    Hand Dominance   Right    Next MD Visit  --   3rd Aug - surgeon     Home  Environment   Lives With  Alone      Prior Function   Vocation  Retired    Leisure  watch tv, needlepoint, sewing       AROM   Overall AROM Comments  arthritis in both shoulders - had decease ROM        LYMPHEDEMA/ONCOLOGY QUESTIONNAIRE - 11/19/18 1501      Type   Cancer Type  R breast CA       Surgeries   Mastectomy Date  09/20/18    Lumpectomy Date  06/12/18    Axillary Lymph Node Dissection Date  09/20/18    Number Lymph Nodes Removed  --   per pt 13 removed      Date Lymphedema/Swelling Started   Date  11/01/18      Treatment   Active Chemotherapy Treatment  --   starting possibly Aug 10th      What other symptoms do you have   Are you Having Heaviness or Tightness  Yes    Are you having Pain  No    Are you having pitting edema  No    Is it Hard or Difficult finding clothes that fit  No    Do you have infections  Yes    Comments  yesterday put on antibotics flap surgery     Is there Decreased scar mobility  Yes      Lymphedema Stage   Stage  STAGE 2 SPONTANEOUSLY IRREVERSIBLE      Right Upper Extremity Lymphedema   15 cm Proximal to Olecranon Process  40.2 cm    10 cm Proximal to Olecranon Process  38.8 cm    Olecranon Process  27.5 cm    15 cm Proximal to Ulnar Styloid Process  22.5 cm    10 cm Proximal to Ulnar Styloid Process  20.5 cm    Just Proximal to Ulnar Styloid Process  17 cm    Across Hand at [employer]  19 cm    At [place] of 2nd Digit  6 cm    At [facility] of Thumb  6.5 cm      Left Upper Extremity Lymphedema   15 cm Proximal to Olecranon Process  37.5 cm    10 cm Proximal to Olecranon Process  37 cm    Olecranon Process  25.8 cm    15 cm Proximal to Ulnar Styloid Process  21.5 cm    10 cm Proximal to Ulnar Styloid Process  19.2 cm  Just Proximal to Ulnar Styloid Process  14.5 cm    Across Hand at [employer]  17 cm    At [place] of 2nd Digit  5.5 cm    At [facility] of Thumb  5.8 cm         Provided pt a temporary isotoner glove and tubigrip D for hand and forearm to elbow to wear as much as she can  But did not start any therapy - has to be on antibiotic for 5-7              OT Education - 11/19/18 1732    Education Details  findings of eval , lymphedema diagnosis and POC    Person(s) Educated  Patient    Methods  Explanation;Demonstration;Tactile cues;Verbal cues;Handout    Comprehension  Returned demonstration;Verbalized understanding       OT Short Term Goals - 11/19/18 1743      OT SHORT TERM GOAL #1   Title  Pt's R UE circumference decrease by at least 1cm -2 cm from hand to upper arm - to be fitted for appropriate compression garments    Baseline  increase 2.7 c m at upperarm ; 1.7cm elbow, 1.5cm  forearm , 2.5cm  wrist and hand 2 cm    Time  3    Period  Weeks    Status  New    Target Date  12/10/18        OT Long Term Goals - 11/19/18 1746      OT LONG TERM GOAL #1   Title  Pt to be fitted wth correct compression for R UE and if indiicated chest to decrease lymphedema and prevent cellulitis    Baseline  no compression - cellulitis of chest -  flap - on antibiotics since yesterday    Time  6    Period  Weeks    Status  New    Target Date  12/31/18            Plan - 11/19/18 1736    Clinical Impression Statement  Pt present at eval with s/p of R UE lymphedema that develop after her 10/30/2018 surgery -Pt with history of R breast CA - patient had her original lumpectomy and sentinel lymph node biopsy back in February 2020.  She then had an MRI which showed more areas of enhancement and was taken back to the OR for mastectomy and axillary lymph node dissection on 09/20/2018 which showed 15 of the lymph nodes were also positive for malignancy and this upstaged her to stage IIIa disease.  Plan was to offer her adjuvant chemotherapy following this.  However her mastectomy course was complicated by flap necrosis requiring debridement  antibiotics and she had to be also taken to the OR on 10/30/2018.  Her wound has not completely healed yet and she still has a drain in place.  She see Dr. Dahlia Byes  11/18/2018.  There is concern for early cellulitis of her chest wall.  Dr. Dahlia Byes start the patient on Augmentin for 2 more weeks which further delays her chemotherapy. Pt can benefit from OT servics for R UE lymphedema but pt needs to be on at least 5-7 days of antibiotics prior to attemps to do any CDT - pt R UE increase by 2.7 cm in upperarm , elbow 1.7 cm , foreram 1.5 cm , wrist 2.5 cm and hand 2 cm - stage 2 lymphedema    OT Occupational Profile and History  Problem Focused  Assessment - Including review of records relating to presenting problem    Occupational performance deficits (Please refer to evaluation for details):  ADL's;Play;Leisure;IADL's    Body Structure / Function / Physical Skills  ADL;UE functional use;Scar mobility;Skin integrity;Edema    Rehab Potential  Good    Clinical Decision Making  Several treatment options, min-mod task modification necessary    Comorbidities Affecting Occupational Performance:  May have comorbidities impacting occupational performance    Modification or Assistance to Complete Evaluation   No modification of tasks or assist necessary to complete eval    OT Frequency  --   1-3 x wks depends on decongesting progress   OT Duration  6 weeks    OT Treatment/Interventions  Self-care/ADL training;Manual lymph drainage;Therapeutic exercise;Patient/family education;Scar mobilization;Manual Therapy    Plan  assess progress with HEP and after being on antibiotics for week and check up with surgeon 11/25/2018    OT Home Exercise Plan  see pt instruction    Consulted and Agree with Plan of Care  Patient       Patient will benefit from skilled therapeutic intervention in order to improve the following deficits and impairments:   Body Structure / Function / Physical Skills: ADL, UE functional use, Scar mobility,  Skin integrity, Edema       Visit Diagnosis: 1. Postmastectomy lymphedema syndrome   2. Scar condition and fibrosis of skin       Problem List Patient Active Problem List   Diagnosis Date Noted  . Infected skin flap 10/09/2018  . Cataract 09/24/2018  . Urge incontinence of urine 09/24/2018  . Malignant neoplasm of right female breast ([place]) 06/07/2018  . Goals of care, counseling/discussion 06/07/2018  . Family history of breast cancer   . Dairy product intolerance 01/14/2018  . Seborrheic keratosis 04/10/2017  . Dysuria 02/19/2017  . Visual disturbance 10/03/2016  . GERD (gastroesophageal reflux disease) 09/14/2016  . HLD (hyperlipidemia) 08/13/2015  . Elevated alkaline phosphatase level 02/14/2015  . Hypertension 11/13/2014  . 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    Rosalyn Gess  OTR/l,CLT 11/19/2018, 5:53 PM  [facility] [address] [place], [address] Phone: [phone]   Fax:  [phone]  Name: Theresa Reid MRN: [id number] Date of Birth: Sep 03, 1940

## 2018-11-19 NOTE — Progress Notes (Signed)
06/12/18 Lumpectomy w Positive Margins, MRI showing extensive Dz and pt elected for mastectomy by Dr. Bary Castilla 09/20/18 Right MR mastectomy for invasive lobular CA complicated by flap necrosis  10/30/18 Debridement mastectomy flaps with closure  She has been doing well, completed a/bs Drain with less 20cc a day, No fevers no chills  PE NAD Flaps are viable, no necrosis, . Drain removed. Now on the medial and superior aspect of the wound she is starting to develop blanching erythema.   A/p Given new cellulitis we will treat with Augmentin and I would wait another week before chemo is started. F/U 1-2 weeks

## 2018-11-19 NOTE — Patient Instructions (Signed)
Provided pt a temporary isotoner glove and tubigrip D for hand and forearm to elbow to wear as much as she can  But did not start any therapy - has to be on antibiotic for 5-7 days at least

## 2018-11-19 NOTE — Progress Notes (Signed)
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  Patient underwent lumpectomy and sentinel lymph node biopsy in February 2020.  Pathology showed invasive lobular carcinoma with positive medial margin which was reexcised with still positive.  Tumor was 24 mm, grade 2 ER PR positive and HER-2/neu negative.  One sentinel lymph node that was excised was positive for macro met of 8 mm.  No extranodal extension was present.  MammaPrint came back as high risk with an average 10-year risk of untreated disease is 29%.  Patient then underwent a bilateral MRI which showed additional suspicious non-mass enhancement along the inferior margin of the biopsy cavity extending 2 cm highly suspicious for continuous disease and another highly suspicious linear non-mass enhancement at the base of the nipple concerning for multifocal multicentric disease.  Patient underwent right mastectomy and axillary lymph node dissection on 09/20/2018.  There was no residual invasive mammary carcinoma within the breast.  However 15 out of 20 lymph nodes were involved with metastatic carcinoma measuring up to 6 mm and remaining 5 lymph nodes  with isolated tumor cells.  Pathologic stage was [id number]  Despite having surgery in May 2020 which was a second surgery-patient has not been able to start adjuvant chemotherapy.  Her mastectomy was complicated by flap necrosis requiring debridement antibiotics and she had to be taken back to the OR for the same.  Interval history-patient states that she is doing well since her last debridement surgery on 10/30/2018.  She still has a drain in place which has not been draining much and she will be seeing Dr. Adora Fridge today.  ECOG PS- 1 Pain scale- 0 Opioid associated constipation- no  Review of systems- Review of Systems  Constitutional: Negative for chills, fever, malaise/fatigue and weight loss.  HENT: Negative for congestion, ear discharge and nosebleeds.   Eyes: Negative for blurred vision.  Respiratory: Negative for cough, hemoptysis, sputum production, shortness of breath and wheezing.   Cardiovascular: Negative for chest pain, palpitations, orthopnea and claudication.  Gastrointestinal: Negative for abdominal pain, blood in stool, constipation, diarrhea, heartburn, melena, nausea and vomiting.  Genitourinary: Negative for dysuria, flank pain, frequency, hematuria and urgency.  Musculoskeletal: Negative for back pain, joint pain and myalgias.  Skin: Negative for rash.  Neurological: Negative for dizziness, tingling, focal weakness, seizures, weakness and headaches.  Endo/Heme/Allergies: Does not bruise/bleed easily.  Psychiatric/Behavioral: Negative for depression and suicidal ideas. The patient does not have insomnia.       No Known Allergies   Past Medical History:  Diagnosis Date   Arthritis    Breast cancer ([place])    Diverticulitis    Family history of breast cancer    GERD (gastroesophageal reflux disease)    Hyperlipidemia    Hypertension  Past Surgical History:  Procedure Laterality Date   BREAST BIOPSY Right 05/28/2018   [place] bx, INVASIVE MAMMARY CARCINOMA  WITH LOBULAR FEATURES   BREAST LUMPECTOMY Right 06/12/2018   [place], lobular features   BREAST LUMPECTOMY WITH SENTINEL LYMPH NODE BIOPSY Right 06/12/2018   Procedure: RIGHT BREAST WIDE EXCISION WITH SENTINEL LYMPH NODE BX;  Surgeon: Robert Bellow, MD;  Location: [facility] ORS;  Service: General;  Laterality: Right;   [place] AND DRAINAGE OF WOUND Right 10/30/2018   Procedure: IRRIGATION AND DEBRIDEMENT RIGHT CHEST WALL WOUND;  Surgeon: Robert Bellow, MD;  Location: [facility] ORS;  Service: General;  Laterality: Right;   JOINT REPLACEMENT     MASTECTOMY WITH AXILLARY LYMPH NODE DISSECTION Right 09/20/2018   Procedure: MASTECTOMY WITH AXILLARY LYMPH NODE DISSECTION RIGHT;  Surgeon: Robert Bellow, MD;  Location: [facility] ORS;  Service: General;  Laterality: Right;   ovaraian cyst removal Right    Cyst only (NOT OVARY)   PORTACATH PLACEMENT Left 09/20/2018   Procedure: INSERTION PORT-A-CATH, LEFT;  Surgeon: Robert Bellow, MD;  Location: [facility] ORS;  Service: General;  Laterality: Left;   TOTAL KNEE ARTHROPLASTY Bilateral 05/05/2010    Social History   Socioeconomic History   Marital status: Single    Spouse name: Not on file   Number of children: Not on file   Years of education: Not on file   Highest education level: Not on file  Occupational History   Occupation: retired  Scientist, product/process development strain: Not hard at all   Food insecurity    Worry: Never true    Inability: Never true   Transportation needs    Medical: No    Non-medical: No  Tobacco Use   Smoking status: Never Smoker   Smokeless tobacco: Never Used  Substance and Sexual Activity   Alcohol use: No    Alcohol/week: 0.0 standard drinks   Drug use: No   Sexual activity: Never  Lifestyle   Physical activity    Days per week: Not on file    Minutes per session: Not on file   Stress: Not at all  Relationships   Social connections     Talks on phone: Not on file    Gets together: Not on file    Attends religious service: Not on file    Active member of club or organization: Not on file    Attends meetings of clubs or organizations: Not on file    Relationship status: Not on file   Intimate partner violence    Fear of current or ex partner: Not on file    Emotionally abused: Not on file    Physically abused: Not on file    Forced sexual activity: Not on file  Other Topics Concern   Not on file  Social History Narrative   Retired    Lives by herself    Pets: None   Caffeine- Coffee 2 cups daily, no tea/soda       Family History  Problem Relation Age of Onset   Alcohol abuse Mother        deceased 43   Diabetes Father    Breast cancer Maternal Aunt        dx 73s; deceased 28s   Breast cancer Maternal Aunt        dx 50s; deceased 79s     Current Outpatient Medications:    amLODipine ([place]) 5  MG tablet, TAKE 1 TABLET BY MOUTH  DAILY, Disp: 90 tablet, Rfl: 1   anastrozole (ARIMIDEX) 1 MG tablet, Take 1 tablet (1 mg total) by mouth daily., Disp: 30 tablet, Rfl: 3   atorvastatin (LIPITOR) 80 MG tablet, TAKE 1 TABLET BY MOUTH  DAILY, Disp: 90 tablet, Rfl: 1   CALCIUM-VITAMIN D PO, Take 1 tablet by mouth daily. , Disp: , Rfl:    cefadroxil (DURICEF) 500 MG capsule, Take 1 capsule (500 mg total) by mouth 2 (two) times daily., Disp: 20 capsule, Rfl: 0   CRANBERRY PO, Take 1 tablet by mouth 2 (two) times daily. , Disp: , Rfl:    Multiple Vitamins tablet, Take 1 tablet by mouth daily. , Disp: , Rfl:    Omega-3 Fatty Acids (FISH OIL PO), Take 1 capsule by mouth daily. , Disp: , Rfl:    oxybutynin (DITROPAN) 5 MG tablet, TAKE 1 TABLET BY MOUTH TWO  TIMES DAILY, Disp: 180 tablet, Rfl: 1   pantoprazole (PROTONIX) 40 MG tablet, TAKE 1 TABLET BY MOUTH  DAILY, Disp: 90 tablet, Rfl: 1   potassium chloride (K-DUR) 10 MEQ tablet, TAKE 1 TABLET BY MOUTH  DAILY, Disp: 90 tablet, Rfl: 1   torsemide  (DEMADEX) 5 MG tablet, TAKE 1 TABLET BY MOUTH  DAILY, Disp: 90 tablet, Rfl: 1   Turmeric 500 MG CAPS, Take 1,000 mg by mouth 2 (two) times daily. , Disp: , Rfl:    amoxicillin-clavulanate (AUGMENTIN) 875-125 MG tablet, Take 1 tablet by mouth 2 (two) times daily for 7 days., Disp: 14 tablet, Rfl: 0   sulfamethoxazole-trimethoprim (BACTRIM DS) 800-160 MG tablet, Take 1 tablet by mouth 2 (two) times daily., Disp: 30 tablet, Rfl: 0   sulfamethoxazole-trimethoprim (BACTRIM DS) 800-160 MG tablet, Take 1 tablet by mouth 2 (two) times daily., Disp: 20 tablet, Rfl: 0  Current Facility-Administered Medications:    piperacillin-tazobactam (ZOSYN) IVPB 3.375 g, 3.375 g, Intravenous, Once, Byrnett, Forest Gleason, MD  Physical exam:  Vitals:   11/18/18 1315  BP: 139/78  Pulse: 94  Resp: 18  Temp: 98.6 F (37 C)  TempSrc: Tympanic  Weight: 187 lb 3.2 oz (84.9 kg)   Physical Exam Constitutional:      General: She is not in acute distress. HENT:     Head: Normocephalic and atraumatic.  Eyes:     Pupils: Pupils are equal, round, and reactive to light.  Neck:     Musculoskeletal: Normal range of motion.  Cardiovascular:     Rate and Rhythm: Normal rate and regular rhythm.     Heart sounds: Normal heart sounds.  Pulmonary:     Effort: Pulmonary effort is normal.     Breath sounds: Normal breath sounds.  Abdominal:     General: Bowel sounds are normal.     Palpations: Abdomen is soft.  Skin:    General: Skin is warm and dry.  Neurological:     Mental Status: She is alert and oriented to person, place, and time.   Patient is status post right mastectomy without reconstruction.  Chest wall wound is healing well.  There is erythema noted however around the chest wall concerning for some early cellulitis  CMP Latest Ref Rng & Units 10/11/2018  Glucose 70 - 99 mg/dL -  BUN 8 - 23 mg/dL -  Creatinine 0.44 - 1.00 mg/dL 0.30(L)  Sodium 135 - 145 mmol/L -  Potassium 3.5 - 5.1 mmol/L -  Chloride  98 - 111 mmol/L -  CO2 22 -  32 mmol/L -  Calcium 8.9 - 10.3 mg/dL -  Total Protein 6.0 - 8.3 g/dL -  Total Bilirubin 0.2 - 1.2 mg/dL -  Alkaline Phos 39 - 117 U/L -  AST 0 - 37 U/L -  ALT 0 - 35 U/L -   CBC Latest Ref Rng & Units 06/12/2018  WBC 4.0 - 10.5 K/uL 8.7  Hemoglobin 12.0 - 15.0 g/dL 13.7  Hematocrit 36.0 - 46.0 % 42.2  Platelets 150 - 400 K/uL 246       No results found.   Assessment and plan- Patient is a 78 y.o. female with invasive mammary carcinoma of the right breast pathological prognostic stage III A pT2pN3a cNX ER PR positive HER-2/neu negative status post right mastectomy and axillary lymph node dissection.  She is here to discuss adjuvant chemotherapy and further management  1.  Patient had her original lumpectomy and sentinel lymph node biopsy back in February 2020.  She then had an MRI which showed more areas of enhancement and was taken back to the OR for mastectomy and axillary lymph node dissection on 09/20/2018 which showed 15 of the lymph nodes were also positive for malignancy and this upstaged her to stage IIIa disease.  Plan was to offer her adjuvant chemotherapy following this.  However her mastectomy course was complicated by flap necrosis requiring debridement antibiotics and she had to be also taken to the OR on 10/30/2018.  Her wound has not completely healed yet and she still has a drain in place.  She will also be seen Dr. Dahlia Byes later today.  There is concern for early cellulitis of her chest wall.  I did hear from Dr. Adora Fridge after his visit that he is very start the patient on Augmentin for 2 more weeks which further delays her chemotherapy.  2.  Given her age and recent postoperative complications as well as considerable delay in starting chemotherapy due to wound complications-I do not feel that she would be a candidate for dose dense AC chemotherapy and weekly Taxol which would last for 20 weeks.  Her wound has still not completely healed and we are  likely going to wait for another 2 weeks before starting chemotherapy.  TC chemotherapy is also associated with neutropenic complications and I am worried that this may further affect her wound healing.  I will therefore proceed with 12 weekly cycles of Taxol that would offer her some chemotherapy benefit while minimizing potential risks of chemotherapy suggest significant cytopenias that can complicate in the hospitalization.  Discussed risks and benefits of Taxol including all but not limited to nausea, vomiting, low blood counts, potential risk of infections, risk of peripheral neuropathy and infusion reaction.  Patient understands and agrees to proceed as planned.  I will tentatively see her back in 2 weeks time with CBC with differential and CMP to start her first cycle of Taxol.  Treatment will be given with a curative intent.  Patient had stopped her Arimidex before surgery but again restarted.  I have asked her to hold off on Arimidex at this time in anticipation of starting chemotherapy in 2 weeks time.  Patient understands   Total face to face encounter time for this patient visit was 40 min. >50% of the time was  spent in counseling and coordination of care.     Visit Diagnosis 1. Malignant neoplasm of right breast in female, estrogen receptor positive, unspecified site of breast ([place])   2. Goals of care, counseling/discussion  Dr. Randa Evens, MD, MPH [facility] [phone] 11/19/2018 9:33 AM

## 2018-11-20 ENCOUNTER — Telehealth: Payer: Self-pay | Admitting: *Deleted

## 2018-11-20 NOTE — Telephone Encounter (Signed)
Called Theresa Reid and wanted to see if she got my message with change in appt per Dr. Dahlia Byes to start chemo. She did not get the message. I told her that Dr. Dahlia Byes office wanted her to wait 2 weeks to start tx. We moved appt from 8/3 to 8/10 starting at 9:15 for labs then see md then tx. She is agreeable.

## 2018-11-25 ENCOUNTER — Other Ambulatory Visit: Payer: Medicare Other

## 2018-11-25 ENCOUNTER — Other Ambulatory Visit: Payer: Self-pay

## 2018-11-25 ENCOUNTER — Ambulatory Visit: Payer: Medicare Other

## 2018-11-25 ENCOUNTER — Encounter: Payer: Self-pay | Admitting: Surgery

## 2018-11-25 ENCOUNTER — Ambulatory Visit (INDEPENDENT_AMBULATORY_CARE_PROVIDER_SITE_OTHER): Payer: Medicare Other | Admitting: Surgery

## 2018-11-25 ENCOUNTER — Ambulatory Visit: Payer: Medicare Other | Admitting: Oncology

## 2018-11-25 VITALS — BP 143/81 | HR 93 | Temp 97.9°F | Ht 67.0 in | Wt 185.0 lb

## 2018-11-25 DIAGNOSIS — Z09 Encounter for follow-up examination after completed treatment for conditions other than malignant neoplasm: Secondary | ICD-10-CM

## 2018-11-25 NOTE — Patient Instructions (Signed)
Return on six months left breast mammogram .

## 2018-11-26 ENCOUNTER — Ambulatory Visit: Payer: Medicare Other | Attending: Oncology | Admitting: Occupational Therapy

## 2018-11-26 DIAGNOSIS — L905 Scar conditions and fibrosis of skin: Secondary | ICD-10-CM

## 2018-11-26 DIAGNOSIS — I972 Postmastectomy lymphedema syndrome: Secondary | ICD-10-CM

## 2018-11-26 NOTE — Therapy (Signed)
[facility] [address] [place], [address] Phone: [phone]   Fax:  [phone]  Occupational Therapy Treatment  Patient Details  Name: Theresa Reid MRN: [id number] Date of Birth: Sep 30, 1940 Referring Provider (OT): Avon Gully   Encounter Date: 11/26/2018  OT End of Session - 11/26/18 1435    Visit Number  2    Number of Visits  12    Date for OT Re-Evaluation  12/31/18    OT Start Time  1400    OT Stop Time  1430    OT Time Calculation (min)  30 min    Activity Tolerance  Patient tolerated treatment well    Behavior During Therapy  [facility] for tasks assessed/performed       Past Medical History:  Diagnosis Date  . Arthritis   . Breast cancer ([place])   . Diverticulitis   . Family history of breast cancer   . GERD (gastroesophageal reflux disease)   . Hyperlipidemia   . Hypertension     Past Surgical History:  Procedure Laterality Date  . BREAST BIOPSY Right 05/28/2018   [place] b[place], INVASIVE MAMMARY CARCINOMA WITH LOBULAR FEATURES  . BREAST LUMPECTOMY Right 06/12/2018   [facility], lobular features  . BREAST LUMPECTOMY WITH SENTINEL LYMPH NODE BIOPSY Right 06/12/2018   Procedure: RIGHT BREAST WIDE EXCISION WITH SENTINEL LYMPH NODE BX;  Surgeon: Robert Bellow, MD;  Location: [facility] ORS;  Service: General;  Laterality: Right;  . CHOLECYSTECTOMY    . [place]  . INCISION AND DRAINAGE OF WOUND Right 10/30/2018   Procedure: IRRIGATION AND DEBRIDEMENT RIGHT CHEST WALL WOUND;  Surgeon: Robert Bellow, MD;  Location: [facility] ORS;  Service: General;  Laterality: Right;  . JOINT REPLACEMENT    . MASTECTOMY WITH AXILLARY LYMPH NODE DISSECTION Right 09/20/2018   Procedure: MASTECTOMY WITH AXILLARY LYMPH NODE DISSECTION RIGHT;  Surgeon: Robert Bellow, MD;  Location: [facility] ORS;  Service: General;  Laterality: Right;  . ovaraian cyst removal Right    Cyst only (NOT OVARY)  . PORTACATH PLACEMENT  Left 09/20/2018   Procedure: INSERTION PORT-A-CATH, LEFT;  Surgeon: Robert Bellow, MD;  Location: [facility] ORS;  Service: General;  Laterality: Left;  . TOTAL KNEE ARTHROPLASTY Bilateral 05/05/2010    There were no vitals filed for this visit.  Subjective Assessment - 11/26/18 1359    Subjective   Seen surgeon yesterday - cleared me for chemo -and done with antibiotic- arm goes down at night time - no itching or burning done some Eurerin lition and that calmed down    Pertinent History  Patient had her original lumpectomy and sentinel lymph node biopsy back in February 2020 R breast   She then had an MRI which showed more areas of enhancement and was taken back to the OR for mastectomy and axillary lymph node dissection on 09/20/2018 which showed 15 of the lymph nodes were also positive for malignancy and this upstaged her to stage IIIa disease.  Plan was to offer her adjuvant chemotherapy following this.  However her mastectomy course was complicated by flap necrosis requiring debridement antibiotics and she had to be also taken to the OR on 10/30/2018.  Her wound has not completely healed yet and she still has a drain in place.  She seen  Dr. Dahlia Byes on 11/18/2018.  There is concern for early cellulitis of her chest wall.  Dr. Dahlia Byes after his visit started  patient on Augmentin for  2 more weeks which further delays her chemotherapy. Lymphedema in R UE per pt developed after surgery on 10/30/2018 - stayed the same - not going down - denies pain    Patient Stated Goals  I want to get sleeve so my arm don't  get big and to  prevent infection    Currently in Pain?  No/denies          LYMPHEDEMA/ONCOLOGY QUESTIONNAIRE - 11/26/18 1401      Right Upper Extremity Lymphedema   15 cm Proximal to Olecranon Process  41 cm    10 cm Proximal to Olecranon Process  39 cm    Olecranon Process  27 cm    15 cm Proximal to Ulnar Styloid Process  22.2 cm    10 cm Proximal to Ulnar Styloid Process  20 cm    Just  Proximal to Ulnar Styloid Process  15.7 cm    Across Hand at [employer]  18 cm    At [place] of 2nd Digit  6 cm    At [employer] of Thumb  6.2 cm       Pt arrive with isotoner glove and tubigrip D on hand to forearm - decrease circumference in digits to forearm  Pt had no itching with use of Eucerin lotion Infection cleared on chest  Add pect stretch for pt  And scapula retraction - 10 reps  3 x day   Add this date or change compression to decrease circumference prior to fitting for daytime compression sleeve  Cont with isotoner glove   soft tubigrip hand to elbow  And Size E tubigrip mid forearm to upper arm  To wear daytime - and if can night time too  Will reassess in 2 days                OT Education - 11/26/18 1434    Education Details  wearing of temp compression - and pect stretch , scapula retraction add to HEP    Person(s) Educated  Patient    Methods  Explanation;Demonstration;Tactile cues;Verbal cues;Handout    Comprehension  Returned demonstration;Verbalized understanding       OT Short Term Goals - 11/19/18 1743      OT SHORT TERM GOAL #1   Title  Pt's R UE circumference decrease by at least 1cm -2 cm from hand to upper arm - to be fitted for appropriate compression garments    Baseline  increase 2.7 c m at upperarm ; 1.7cm elbow, 1.5cm  forearm , 2.5cm  wrist and hand 2 cm    Time  3    Period  Weeks    Status  New    Target Date  12/10/18        OT Long Term Goals - 11/19/18 1746      OT LONG TERM GOAL #1   Title  Pt to be fitted wth correct compression for R UE and if indiicated chest to decrease lymphedema and prevent cellulitis    Baseline  no compression - cellulitis of chest -  flap - on antibiotics since yesterday    Time  6    Period  Weeks    Status  New    Target Date  12/31/18            Plan - 11/26/18 1435    Clinical Impression Statement  Pt seen surgeon yesterday -and cleared to start chemo -and she is done with  antibiotics - R hand to forearm  decrease in circumference with wearing of compression - add this date compression to elbow and upper arm to weear during day - and if she can tolerate night time too and will assess in 2 days - if can be send for daytime compression sleeve    OT Occupational Profile and History  Problem Focused Assessment - Including review of records relating to presenting problem    Occupational performance deficits (Please refer to evaluation for details):  ADL's;Play;Leisure;IADL's    Body Structure / Function / Physical Skills  ADL;UE functional use;Scar mobility;Skin integrity;Edema    Rehab Potential  Good    Clinical Decision Making  Several treatment options, min-mod task modification necessary    Comorbidities Affecting Occupational Performance:  May have comorbidities impacting occupational performance    Modification or Assistance to Complete Evaluation   No modification of tasks or assist necessary to complete eval    OT Frequency  --   1-2 x wk   OT Duration  6 weeks    OT Treatment/Interventions  Self-care/ADL training;Manual lymph drainage;Therapeutic exercise;Patient/family education;Scar mobilization;Manual Therapy    Plan  assess if R upper arm decrease -and what compession garment to get    OT Home Exercise Plan  see pt instruction    Consulted and Agree with Plan of Care  Patient       Patient will benefit from skilled therapeutic intervention in order to improve the following deficits and impairments:   Body Structure / Function / Physical Skills: ADL, UE functional use, Scar mobility, Skin integrity, Edema       Visit Diagnosis: 1. Postmastectomy lymphedema syndrome   2. Scar condition and fibrosis of skin       Problem List Patient Active Problem List   Diagnosis Date Noted  . Infected skin flap 10/09/2018  . Cataract 09/24/2018  . Urge incontinence of urine 09/24/2018  . Malignant neoplasm of right female breast ([place]) 06/07/2018  . Goals  of care, counseling/discussion 06/07/2018  . Family history of breast cancer   . Dairy product intolerance 01/14/2018  . Seborrheic keratosis 04/10/2017  . Dysuria 02/19/2017  . Visual disturbance 10/03/2016  . GERD (gastroesophageal reflux disease) 09/14/2016  . HLD (hyperlipidemia) 08/13/2015  . Elevated alkaline phosphatase level 02/14/2015  . Hypertension 11/13/2014  . 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    Rosalyn Gess OTR/L,CLT 11/26/2018, 2:38 PM  [facility] [place] [facility] [address] [place], [address] Phone: [phone]   Fax:  [phone]  Name: Theresa Reid MRN: [id number] Date of Birth: 1940-09-23

## 2018-11-26 NOTE — Patient Instructions (Signed)
Keep compression on during day and if she can night time too   pect stretch laying in supine  Scapula retraction - 10 reps  3 x day

## 2018-11-27 NOTE — Progress Notes (Signed)
06/12/18 Lumpectomy w Positive Margins, MRI showing extensive Dz and pt elected for mastectomy by Dr. Bary Castilla 09/20/18 Right MR mastectomy for invasive lobular CA complicated by flap necrosis  10/30/18 Debridement mastectomy flaps with closure  She require antibiotic therapy due to erythema on the superior flap.  She is doing well and she feels stronger.  PE NAD Breast: resolved erythea, flaps viable, no infection  A/p Doing well May do chemo now F/u 3-6 months

## 2018-11-28 ENCOUNTER — Other Ambulatory Visit: Payer: Self-pay

## 2018-11-28 ENCOUNTER — Ambulatory Visit: Payer: Medicare Other | Admitting: Occupational Therapy

## 2018-11-28 DIAGNOSIS — L905 Scar conditions and fibrosis of skin: Secondary | ICD-10-CM | POA: Diagnosis not present

## 2018-11-28 DIAGNOSIS — I972 Postmastectomy lymphedema syndrome: Secondary | ICD-10-CM | POA: Diagnosis not present

## 2018-11-28 NOTE — Therapy (Signed)
[facility] [address] [place], [address] Phone: [phone]   Fax:  [phone]  Occupational Therapy Treatment  Patient Details  Name: Theresa Reid MRN: [id number] Date of Birth: 04-10-1941 Referring Provider (OT): Avon Gully   Encounter Date: 11/28/2018  OT End of Session - 11/28/18 1354    Visit Number  3    Number of Visits  12    Date for OT Re-Evaluation  12/31/18    OT Start Time  1300    OT Stop Time  1347    OT Time Calculation (min)  47 min    Activity Tolerance  Patient tolerated treatment well    Behavior During Therapy  [facility] for tasks assessed/performed       Past Medical History:  Diagnosis Date  . Arthritis   . Breast cancer ([place])   . Diverticulitis   . Family history of breast cancer   . GERD (gastroesophageal reflux disease)   . Hyperlipidemia   . Hypertension     Past Surgical History:  Procedure Laterality Date  . BREAST BIOPSY Right 05/28/2018   [place] bx, INVASIVE MAMMARY CARCINOMA WITH LOBULAR FEATURES  . BREAST LUMPECTOMY Right 06/12/2018   [facility], lobular features  . BREAST LUMPECTOMY WITH SENTINEL LYMPH NODE BIOPSY Right 06/12/2018   Procedure: RIGHT BREAST WIDE EXCISION WITH SENTINEL LYMPH NODE BX;  Surgeon: Robert Bellow, MD;  Location: [facility] ORS;  Service: General;  Laterality: Right;  . CHOLECYSTECTOMY    . [place]  . INCISION AND DRAINAGE OF WOUND Right 10/30/2018   Procedure: IRRIGATION AND DEBRIDEMENT RIGHT CHEST WALL WOUND;  Surgeon: Robert Bellow, MD;  Location: [facility] ORS;  Service: General;  Laterality: Right;  . JOINT REPLACEMENT    . MASTECTOMY WITH AXILLARY LYMPH NODE DISSECTION Right 09/20/2018   Procedure: MASTECTOMY WITH AXILLARY LYMPH NODE DISSECTION RIGHT;  Surgeon: Robert Bellow, MD;  Location: [facility] ORS;  Service: General;  Laterality: Right;  . ovaraian cyst removal Right    Cyst only (NOT OVARY)  . PORTACATH PLACEMENT  Left 09/20/2018   Procedure: INSERTION PORT-A-CATH, LEFT;  Surgeon: Robert Bellow, MD;  Location: [facility] ORS;  Service: General;  Laterality: Left;  . TOTAL KNEE ARTHROPLASTY Bilateral 05/05/2010    There were no vitals filed for this visit.  Subjective Assessment - 11/28/18 1351    Subjective   Doing okay - I kept the sleeve on all time since last time - itch little on the upper arm - but it feels like I have little more swelling under my arm    Pertinent History  Patient had her original lumpectomy and sentinel lymph node biopsy back in February 2020 R breast   She then had an MRI which showed more areas of enhancement and was taken back to the OR for mastectomy and axillary lymph node dissection on 09/20/2018 which showed 15 of the lymph nodes were also positive for malignancy and this upstaged her to stage IIIa disease.  Plan was to offer her adjuvant chemotherapy following this.  However her mastectomy course was complicated by flap necrosis requiring debridement antibiotics and she had to be also taken to the OR on 10/30/2018.  Her wound has not completely healed yet and she still has a drain in place.  She seen  Dr. Dahlia Byes on 11/18/2018.  There is concern for early cellulitis of her chest wall.  Dr. Dahlia Byes after his visit started  patient on  Augmentin for 2 more weeks which further delays her chemotherapy. Lymphedema in R UE per pt developed after surgery on 10/30/2018 - stayed the same - not going down - denies pain    Patient Stated Goals  I want to get sleeve so my arm don't  get big and to  prevent infection          LYMPHEDEMA/ONCOLOGY QUESTIONNAIRE - 11/28/18 1304      Right Upper Extremity Lymphedema   15 cm Proximal to Olecranon Process  40.8 cm    10 cm Proximal to Olecranon Process  39 cm    Olecranon Process  26 cm    15 cm Proximal to Ulnar Styloid Process  22 cm    10 cm Proximal to Ulnar Styloid Process  20 cm    Just Proximal to Ulnar Styloid Process  15.5 cm    Across Hand  at [employer]  17.5 cm    At [place] of 2nd Digit  5.6 cm    At [facility] of Thumb  6 cm      MEasurements decrease some more since last time     Pt arrive with isotoner glove and  Soft tubigrip hand to elbow and tubigrip E from midfoream to upper arm  - to decrease circumference in R UE  Pt had no itching with use of Eucerin lotion Infection cleared on chest - done with antibiotics Add pect stretch for pt  And scapula retraction - 10 reps  3 x day   Soft tissue mobs and massage done to R chest  And around - gentle - pt to do at home 5 min - 2-3 x day And MLD R AAA  10 reps  And R AI - 10 reps  Fitted with komprex foarm to wear under bra on R chest , under arm to spine - as much as she can  Had this date lymphedema under armpit - R UE decrease but upper arm about same - chest /thoracic congested on R  Pt to see DME companyny for daytime comrpession measurements - Custom jobt elvarex  Soft sleeve and glove  And jovipak breast pad  Pt to get ordered and let me know if need pink ribbon fund help  And will look into pump                  OT Education - 11/28/18 1353    Education Details  wearing of temp compression - and pect stretch , scapula retraction add to HEP- and add some soft tissue and scar mobs    Person(s) Educated  Patient    Methods  Explanation;Demonstration;Tactile cues;Verbal cues;Handout    Comprehension  Returned demonstration;Verbalized understanding       OT Short Term Goals - 11/19/18 1743      OT SHORT TERM GOAL #1   Title  Pt's R UE circumference decrease by at least 1cm -2 cm from hand to upper arm - to be fitted for appropriate compression garments    Baseline  increase 2.7 c m at upperarm ; 1.7cm elbow, 1.5cm  forearm , 2.5cm  wrist and hand 2 cm    Time  3    Period  Weeks    Status  New    Target Date  12/10/18        OT Long Term Goals - 11/19/18 1746      OT LONG TERM GOAL #1   Title  Pt to be fitted wth correct  compression for  R UE and if indiicated chest to decrease lymphedema and prevent cellulitis    Baseline  no compression - cellulitis of chest -  flap - on antibiotics since yesterday    Time  6    Period  Weeks    Status  New    Target Date  12/31/18            Plan - 11/28/18 1355    OT Occupational Profile and History  Problem Focused Assessment - Including review of records relating to presenting problem    Occupational performance deficits (Please refer to evaluation for details):  ADL's;Play;Leisure;IADL's    Body Structure / Function / Physical Skills  ADL;UE functional use;Scar mobility;Skin integrity;Edema    Rehab Potential  Good    Clinical Decision Making  Several treatment options, min-mod task modification necessary    Comorbidities Affecting Occupational Performance:  May have comorbidities impacting occupational performance    Modification or Assistance to Complete Evaluation   No modification of tasks or assist necessary to complete eval    OT Frequency  --   1-3 x day   OT Duration  6 weeks    OT Treatment/Interventions  Self-care/ADL training;Manual lymph drainage;Therapeutic exercise;Patient/family education;Scar mobilization;Manual Therapy    Plan  assess if R upper arm decrease -and chest- if got measured for compression    OT Home Exercise Plan  see pt instruction    Consulted and Agree with Plan of Care  Patient       Patient will benefit from skilled therapeutic intervention in order to improve the following deficits and impairments:   Body Structure / Function / Physical Skills: ADL, UE functional use, Scar mobility, Skin integrity, Edema       Visit Diagnosis: 1. Postmastectomy lymphedema syndrome   2. Scar condition and fibrosis of skin       Problem List Patient Active Problem List   Diagnosis Date Noted  . Infected skin flap 10/09/2018  . Cataract 09/24/2018  . Urge incontinence of urine 09/24/2018  . Malignant neoplasm of right female breast ([place])  06/07/2018  . Goals of care, counseling/discussion 06/07/2018  . Family history of breast cancer   . Dairy product intolerance 01/14/2018  . Seborrheic keratosis 04/10/2017  . Dysuria 02/19/2017  . Visual disturbance 10/03/2016  . GERD (gastroesophageal reflux disease) 09/14/2016  . HLD (hyperlipidemia) 08/13/2015  . Elevated alkaline phosphatase level 02/14/2015  . Hypertension 11/13/2014  . 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    Rosalyn Gess OTR/L,CLT 11/28/2018, 1:56 PM  [facility] [address], [place], [address] Phone: [phone]   Fax:  [phone]  Name: JARETZI DROZ MRN: [id number] Date of Birth: 1940-05-31

## 2018-11-28 NOTE — Patient Instructions (Signed)
Pt to cont wearing with temporary compression on R arm- and komprex foam provided for chest , under arm to spine - to wear as much as she can under her bra  Can do some gentle scar mobs and soft tissue - 5 min a day - 2-3 x day  And MLD - doing acrose chest and down R AI - 10 reps  2 x day

## 2018-12-02 ENCOUNTER — Inpatient Hospital Stay: Payer: Medicare Other | Attending: Oncology

## 2018-12-02 ENCOUNTER — Inpatient Hospital Stay: Payer: Medicare Other

## 2018-12-02 ENCOUNTER — Other Ambulatory Visit: Payer: Self-pay

## 2018-12-02 ENCOUNTER — Encounter: Payer: Self-pay | Admitting: Oncology

## 2018-12-02 ENCOUNTER — Inpatient Hospital Stay (HOSPITAL_BASED_OUTPATIENT_CLINIC_OR_DEPARTMENT_OTHER): Payer: Medicare Other | Admitting: Oncology

## 2018-12-02 VITALS — BP 127/74 | HR 80 | Temp 98.8°F | Resp 18

## 2018-12-02 VITALS — BP 132/80 | HR 74 | Temp 98.7°F | Resp 16 | Wt 185.8 lb

## 2018-12-02 DIAGNOSIS — Z9011 Acquired absence of right breast and nipple: Secondary | ICD-10-CM | POA: Insufficient documentation

## 2018-12-02 DIAGNOSIS — Z79899 Other long term (current) drug therapy: Secondary | ICD-10-CM | POA: Insufficient documentation

## 2018-12-02 DIAGNOSIS — Z170 Estrogen receptor positive status [ER+]: Secondary | ICD-10-CM

## 2018-12-02 DIAGNOSIS — C50411 Malignant neoplasm of upper-outer quadrant of right female breast: Secondary | ICD-10-CM

## 2018-12-02 DIAGNOSIS — R1032 Left lower quadrant pain: Secondary | ICD-10-CM | POA: Diagnosis not present

## 2018-12-02 DIAGNOSIS — Z5111 Encounter for antineoplastic chemotherapy: Secondary | ICD-10-CM | POA: Diagnosis not present

## 2018-12-02 DIAGNOSIS — C50911 Malignant neoplasm of unspecified site of right female breast: Secondary | ICD-10-CM

## 2018-12-02 LAB — CBC WITH DIFFERENTIAL/PLATELET
Abs Immature Granulocytes: 0.01 10*3/uL (ref 0.00–0.07)
Basophils Absolute: 0 10*3/uL (ref 0.0–0.1)
Basophils Relative: 1 %
Eosinophils Absolute: 0.3 10*3/uL (ref 0.0–0.5)
Eosinophils Relative: 4 %
HCT: 39.6 % (ref 36.0–46.0)
Hemoglobin: 12.8 g/dL (ref 12.0–15.0)
Immature Granulocytes: 0 %
Lymphocytes Relative: 33 %
Lymphs Abs: 2.3 10*3/uL (ref 0.7–4.0)
MCH: 28.8 pg (ref 26.0–34.0)
MCHC: 32.3 g/dL (ref 30.0–36.0)
MCV: 89 fL (ref 80.0–100.0)
Monocytes Absolute: 0.8 10*3/uL (ref 0.1–1.0)
Monocytes Relative: 11 %
Neutro Abs: 3.7 10*3/uL (ref 1.7–7.7)
Neutrophils Relative %: 51 %
Platelets: 274 10*3/uL (ref 150–400)
RBC: 4.45 MIL/uL (ref 3.87–5.11)
RDW: 14.5 % (ref 11.5–15.5)
WBC: 7.1 10*3/uL (ref 4.0–10.5)
nRBC: 0 % (ref 0.0–0.2)

## 2018-12-02 LAB — COMPREHENSIVE METABOLIC PANEL
ALT: 16 U/L (ref 0–44)
AST: 21 U/L (ref 15–41)
Albumin: 3.5 g/dL (ref 3.5–5.0)
Alkaline Phosphatase: 114 U/L (ref 38–126)
Anion gap: 9 (ref 5–15)
BUN: 8 mg/dL (ref 8–23)
CO2: 25 mmol/L (ref 22–32)
Calcium: 9.1 mg/dL (ref 8.9–10.3)
Chloride: 107 mmol/L (ref 98–111)
Creatinine, Ser: 0.59 mg/dL (ref 0.44–1.00)
GFR calc Af Amer: >60 mL/min (ref >60)
GFR calc non Af Amer: >60 mL/min (ref >60)
Glucose, Bld: 122 mg/dL — ABNORMAL HIGH (ref 70–99)
Potassium: 3.6 mmol/L (ref 3.5–5.1)
Sodium: 141 mmol/L (ref 135–145)
Total Bilirubin: 0.6 mg/dL (ref 0.3–1.2)
Total Protein: 6.8 g/dL (ref 6.5–8.1)

## 2018-12-02 MED ORDER — SODIUM CHLORIDE 0.9 % IV SOLN
80.0000 mg/m2 | Freq: Once | INTRAVENOUS | Status: AC
  Administered 2018-12-02: 156 mg via INTRAVENOUS
  Filled 2018-12-02: qty 26

## 2018-12-02 MED ORDER — SODIUM CHLORIDE 0.9 % IV SOLN
20.0000 mg | Freq: Once | INTRAVENOUS | Status: AC
  Administered 2018-12-02: 20 mg via INTRAVENOUS
  Filled 2018-12-02: qty 2

## 2018-12-02 MED ORDER — FAMOTIDINE IN NACL 20-0.9 MG/50ML-% IV SOLN
20.0000 mg | Freq: Once | INTRAVENOUS | Status: AC
  Administered 2018-12-02: 20 mg via INTRAVENOUS
  Filled 2018-12-02: qty 50

## 2018-12-02 MED ORDER — DIPHENHYDRAMINE HCL 50 MG/ML IJ SOLN
50.0000 mg | Freq: Once | INTRAMUSCULAR | Status: AC
  Administered 2018-12-02: 50 mg via INTRAVENOUS
  Filled 2018-12-02: qty 1

## 2018-12-02 MED ORDER — HEPARIN SOD (PORK) LOCK FLUSH 100 UNIT/ML IV SOLN
500.0000 [IU] | Freq: Once | INTRAVENOUS | Status: AC | PRN
  Administered 2018-12-02: 500 [IU]
  Filled 2018-12-02: qty 5

## 2018-12-02 MED ORDER — SODIUM CHLORIDE 0.9 % IV SOLN
Freq: Once | INTRAVENOUS | Status: AC
  Administered 2018-12-02: 11:00:00 via INTRAVENOUS
  Filled 2018-12-02: qty 250

## 2018-12-02 NOTE — Progress Notes (Signed)
Pt denies any complaints at the time of discharge. Pt and VS stable at discharge.

## 2018-12-02 NOTE — Progress Notes (Signed)
Patient is here for follow up, she is doing well, she does mention lightheadedness sometimes and she noticed this as a side effect of antibiotic she was on per Dr. Youlanda Roys.

## 2018-12-03 ENCOUNTER — Encounter: Payer: Self-pay | Admitting: General Surgery

## 2018-12-03 ENCOUNTER — Encounter: Payer: Medicare Other | Admitting: Occupational Therapy

## 2018-12-05 ENCOUNTER — Telehealth: Payer: Self-pay | Admitting: *Deleted

## 2018-12-05 NOTE — Telephone Encounter (Signed)
Error

## 2018-12-05 NOTE — Telephone Encounter (Signed)
Patient called asking if the Taxol could be causing her to have arthritic type pain or if her arthritis is flaring up. I explained tro her that joint and muscle pain are side effects form Taxol and asked if she had anything to take for it. She replied that it was not bad enough to take medicine for it. She will take Tylenol if it gets bad enough not to exceed daily recommended dosing and if worsens, she will call us back

## 2018-12-06 ENCOUNTER — Other Ambulatory Visit: Payer: Self-pay

## 2018-12-08 NOTE — Progress Notes (Signed)
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  Patient underwent lumpectomy and sentinel lymph node biopsy in February 2020.  Pathology showed invasive lobular carcinoma with positive medial margin which was reexcised with still positive.  Tumor was 24 mm, grade 2 ER PR positive and HER-2/neu negative.  One sentinel lymph node that was excised was positive for macro met of 8 mm.  No extranodal extension was present.  MammaPrint came back as high risk with an average 10-year risk of untreated disease is 29%.  Patient then underwent a bilateral MRI which showed additional suspicious non-mass enhancement along the inferior margin of the biopsy cavity extending 2 cm highly suspicious for continuous disease and another highly suspicious linear non-mass enhancement at the base of the nipple concerning for multifocal multicentric disease.  Patient underwent right mastectomy and axillary lymph node dissection on 09/20/2018.  There was no residual invasive mammary carcinoma within the breast.  However 15 out of 20 lymph nodes were involved with metastatic carcinoma measuring up to 6 mm and remaining  5 lymph nodes with isolated tumor cells.  Pathologic stage was [id number] °  °Despite having surgery in May 2020 which was a second surgery-patient has not been able to start adjuvant chemotherapy.  Her mastectomy was complicated by flap necrosis requiring debridement antibiotics and she had to be taken back to the OR for the same. °  ° °Interval history- overall she feels well. Denies any fever, sob. Chest wall redness has gone down ° °ECOG PS- 1 °Pain scale- 0 ° ° °Review of systems- Review of Systems  °Constitutional: Negative for chills, fever, malaise/fatigue and weight loss.  °HENT: Negative for congestion, ear discharge and nosebleeds.   °Eyes: Negative for blurred vision.  °Respiratory: Negative for cough, hemoptysis, sputum production, shortness of breath and wheezing.   °Cardiovascular: Negative for chest pain, palpitations, orthopnea and claudication.  °Gastrointestinal: Negative for abdominal pain, blood in stool, constipation, diarrhea, heartburn, melena, nausea and vomiting.  °Genitourinary: Negative for dysuria, flank pain, frequency, hematuria and urgency.  °Musculoskeletal: Negative for back pain, joint pain and myalgias.  °Skin: Negative for rash.  °Neurological: Negative for dizziness, tingling, focal weakness, seizures, weakness and headaches.  °Endo/Heme/Allergies: Does not bruise/bleed easily.  °Psychiatric/Behavioral: Negative for depression and suicidal ideas. The patient does not have insomnia.   °  ° ° °No Known Allergies ° ° °Past Medical History:  °Diagnosis Date  °• Arthritis   °• Breast cancer ([facility])   °• Diverticulitis   °• Family history of breast cancer   °• GERD (gastroesophageal reflux disease)   °• Hyperlipidemia   °• Hypertension   ° ° ° °Past Surgical History:  °Procedure Laterality Date  °• BREAST BIOPSY Right 05/28/2018  ° [place], INVASIVE MAMMARY CARCINOMA WITH LOBULAR FEATURES  °• BREAST LUMPECTOMY Right   Right 06/12/2018   [place], lobular features   BREAST LUMPECTOMY WITH SENTINEL  LYMPH NODE BIOPSY Right 06/12/2018   Procedure: RIGHT BREAST WIDE EXCISION WITH SENTINEL LYMPH NODE BX;  Surgeon: Robert Bellow, MD;  Location: [facility] ORS;  Service: General;  Laterality: Right;   [place] AND DRAINAGE OF WOUND Right 10/30/2018   Procedure: IRRIGATION AND DEBRIDEMENT RIGHT CHEST WALL WOUND;  Surgeon: Robert Bellow, MD;  Location: [facility] ORS;  Service: General;  Laterality: Right;   JOINT REPLACEMENT     MASTECTOMY WITH AXILLARY LYMPH NODE DISSECTION Right 09/20/2018   Procedure: MASTECTOMY WITH AXILLARY LYMPH NODE DISSECTION RIGHT;  Surgeon: Robert Bellow, MD;  Location: [facility] ORS;  Service: General;  Laterality: Right;   ovaraian cyst removal Right    Cyst only (NOT OVARY)   PORTACATH PLACEMENT Left 09/20/2018   Procedure: INSERTION PORT-A-CATH, LEFT;  Surgeon: Robert Bellow, MD;  Location: [facility] ORS;  Service: General;  Laterality: Left;   TOTAL KNEE ARTHROPLASTY Bilateral 05/05/2010    Social History   Socioeconomic History   Marital status: Single    Spouse name: Not on file   Number of children: Not on file   Years of education: Not on file   Highest education level: Not on file  Occupational History   Occupation: retired  Scientist, product/process development strain: Not hard at all   Food insecurity    Worry: Never true    Inability: Never true   Transportation needs    Medical: No    Non-medical: No  Tobacco Use   Smoking status: Never Smoker   Smokeless tobacco: Never Used  Substance and Sexual Activity   Alcohol use: No    Alcohol/week: 0.0 standard drinks   Drug use: No   Sexual activity: Never  Lifestyle   Physical activity    Days per week: Not on file    Minutes per session: Not on file   Stress: Not at all  Relationships   Social connections    Talks on phone: Not on file    Gets together: Not on file    Attends religious service: Not on file    Active member  of club or organization: Not on file    Attends meetings of clubs or organizations: Not on file    Relationship status: Not on file   Intimate partner violence    Fear of current or ex partner: Not on file    Emotionally abused: Not on file    Physically abused: Not on file    Forced sexual activity: Not on file  Other Topics Concern   Not on file  Social History Narrative   Retired    Lives by herself    Pets: None   Caffeine- Coffee 2 cups daily, no tea/soda       Family History  Problem Relation Age of Onset   Alcohol abuse Mother        deceased 68   Diabetes Father    Breast cancer Maternal Aunt        dx 60s; deceased 83s   Breast cancer Maternal Aunt        dx 34s; deceased 61s     Current Outpatient Medications:    amLODipine (NORVASC) 5 MG tablet, TAKE 1 TABLET BY MOUTH  DAILY, Disp: 90 tablet, Rfl: 1   anastrozole (ARIMIDEX) 1 MG tablet, Take 1 tablet (1 mg total)  mouth daily., Disp: 30 tablet, Rfl: 3 °•  atorvastatin (LIPITOR) 80 MG tablet, TAKE 1 TABLET BY MOUTH  DAILY, Disp: 90 tablet, Rfl: 1 °•  CALCIUM-VITAMIN D PO, Take 1 tablet by mouth daily. , Disp: , Rfl:  °•  cefadroxil (DURICEF) 500 MG capsule, Take 1 capsule (500 mg total) by mouth 2 (two) times daily., Disp: 20 capsule, Rfl: 0 °•  CRANBERRY PO, Take 1 tablet by mouth 2 (two) times daily. , Disp: , Rfl:  °•  Multiple Vitamins tablet, Take 1 tablet by mouth daily. , Disp: , Rfl:  °•  Omega-3 Fatty Acids (FISH OIL PO), Take 1 capsule by mouth daily. , Disp: , Rfl:  °•  oxybutynin (DITROPAN) 5 MG tablet, TAKE 1 TABLET BY MOUTH TWO  TIMES DAILY, Disp: 180 tablet, Rfl: 1 °•  pantoprazole (PROTONIX) 40 MG tablet, TAKE 1 TABLET BY MOUTH  DAILY, Disp: 90 tablet, Rfl: 1 °•  potassium chloride (K-DUR) 10 MEQ tablet, TAKE 1 TABLET BY MOUTH  DAILY, Disp: 90 tablet, Rfl: 1 °•  sulfamethoxazole-trimethoprim (BACTRIM DS) 800-160 MG tablet, Take 1 tablet by mouth 2 (two) times daily., Disp: 20 tablet, Rfl: 0 °•   torsemide (DEMADEX) 5 MG tablet, TAKE 1 TABLET BY MOUTH  DAILY, Disp: 90 tablet, Rfl: 1 °•  Turmeric 500 MG CAPS, Take 1,000 mg by mouth 2 (two) times daily. , Disp: , Rfl:  ° °Current Facility-Administered Medications:  °•  piperacillin-tazobactam (ZOSYN) IVPB 3.375 g, 3.375 g, Intravenous, Once, Byrnett, Jeffrey W, MD ° °Physical exam:  °Vitals:  ° 12/02/18 0951  °BP: 132/80  °Pulse: 74  °Resp: 16  °Temp: 98.7 °F (37.1 °C)  °TempSrc: Tympanic  °SpO2: 97%  °Weight: 185 lb 12.8 oz (84.3 kg)  ° °Physical Exam °Constitutional:   °   General: She is not in acute distress. °HENT:  °   Head: Normocephalic and atraumatic.  °Eyes:  °   Pupils: Pupils are equal, round, and reactive to light.  °Neck:  °   Musculoskeletal: Normal range of motion.  °Cardiovascular:  °   Rate and Rhythm: Normal rate and regular rhythm.  °   Heart sounds: Normal heart sounds.  °Pulmonary:  °   Effort: Pulmonary effort is normal.  °   Breath sounds: Normal breath sounds.  °Abdominal:  °   General: Bowel sounds are normal.  °   Palpations: Abdomen is soft.  °Skin: °   General: Skin is warm and dry.  °Neurological:  °   Mental Status: She is alert and oriented to person, place, and time.  ° ° Patient is s/p right mastectomy without reconstruction. Chest wall erythema has decreased. No signs of active infection ° °CMP Latest Ref Rng & Units 12/02/2018  °Glucose 70 - 99 mg/dL 122(H)  °BUN 8 - 23 mg/dL 8  °Creatinine 0.44 - 1.00 mg/dL 0.59  °Sodium 135 - 145 mmol/L 141  °Potassium 3.5 - 5.1 mmol/L 3.6  °Chloride 98 - 111 mmol/L 107  °CO2 22 - 32 mmol/L 25  °Calcium 8.9 - 10.3 mg/dL 9.1  °Total Protein 6.5 - 8.1 g/dL 6.8  °Total Bilirubin 0.3 - 1.2 mg/dL 0.6  °Alkaline Phos 38 - 126 U/L 114  °AST 15 - 41 U/L 21  °ALT 0 - 44 U/L 16  ° °CBC Latest Ref Rng & Units 12/02/2018  °WBC 4.0 - 10.5 K/uL 7.1  °Hemoglobin 12.0 - 15.0 g/dL 12.8  °Hematocrit 36.0 - 46.0 % 39.6  °Platelets 150 - 400 K/uL   274  ° ° °Assessment and plan- Patient is a 77 y.o. female  with  invasive mammary carcinoma of the right breast pathological prognostic stage III A [id number] cNX ER PR positive HER-2/neu negative status post right mastectomy and axillary lymph node dissection.  she is here for on treatment assessment prior to cycle 1 of weekly taxol chemotherapy ° °Counts ok to proceed with cycle 1 of weekly taxol chemotherapy today. I will see her back in 1 weeks time for cycle 2. ° °Less intensive chemotherapy has been chosen given patiets age, post mastectomy complications of wound healing to lower propensity for chemotherapy complications °  °Visit Diagnosis °1. Malignant neoplasm of upper-outer quadrant of right breast in female, estrogen receptor positive ([facility])   °2. Encounter for antineoplastic chemotherapy   ° ° ° °Dr. Archana Rao, MD, MPH °[facility] °[phone] °12/08/2018 °2:49 PM ° ° ° ° ° ° °    ° ° ° ° ° °

## 2018-12-09 ENCOUNTER — Encounter: Payer: Self-pay | Admitting: Oncology

## 2018-12-09 ENCOUNTER — Other Ambulatory Visit: Payer: Self-pay

## 2018-12-09 ENCOUNTER — Inpatient Hospital Stay: Payer: Medicare Other

## 2018-12-09 ENCOUNTER — Inpatient Hospital Stay (HOSPITAL_BASED_OUTPATIENT_CLINIC_OR_DEPARTMENT_OTHER): Payer: Medicare Other | Admitting: Oncology

## 2018-12-09 VITALS — BP 118/88 | HR 91 | Temp 97.8°F | Ht 67.0 in | Wt 184.0 lb

## 2018-12-09 DIAGNOSIS — Z170 Estrogen receptor positive status [ER+]: Secondary | ICD-10-CM | POA: Diagnosis not present

## 2018-12-09 DIAGNOSIS — Z9011 Acquired absence of right breast and nipple: Secondary | ICD-10-CM | POA: Diagnosis not present

## 2018-12-09 DIAGNOSIS — C50411 Malignant neoplasm of upper-outer quadrant of right female breast: Secondary | ICD-10-CM

## 2018-12-09 DIAGNOSIS — Z79899 Other long term (current) drug therapy: Secondary | ICD-10-CM | POA: Diagnosis not present

## 2018-12-09 DIAGNOSIS — Z5111 Encounter for antineoplastic chemotherapy: Secondary | ICD-10-CM

## 2018-12-09 DIAGNOSIS — R1032 Left lower quadrant pain: Secondary | ICD-10-CM | POA: Diagnosis not present

## 2018-12-09 DIAGNOSIS — C50911 Malignant neoplasm of unspecified site of right female breast: Secondary | ICD-10-CM

## 2018-12-09 LAB — CBC WITH DIFFERENTIAL/PLATELET
Abs Immature Granulocytes: 0.06 10*3/uL (ref 0.00–0.07)
Basophils Absolute: 0 10*3/uL (ref 0.0–0.1)
Basophils Relative: 1 %
Eosinophils Absolute: 0.1 10*3/uL (ref 0.0–0.5)
Eosinophils Relative: 2 %
HCT: 39.6 % (ref 36.0–46.0)
Hemoglobin: 12.8 g/dL (ref 12.0–15.0)
Immature Granulocytes: 1 %
Lymphocytes Relative: 37 %
Lymphs Abs: 2.2 10*3/uL (ref 0.7–4.0)
MCH: 28.8 pg (ref 26.0–34.0)
MCHC: 32.3 g/dL (ref 30.0–36.0)
MCV: 89.2 fL (ref 80.0–100.0)
Monocytes Absolute: 0.4 10*3/uL (ref 0.1–1.0)
Monocytes Relative: 6 %
Neutro Abs: 3.2 10*3/uL (ref 1.7–7.7)
Neutrophils Relative %: 53 %
Platelets: 267 10*3/uL (ref 150–400)
RBC: 4.44 MIL/uL (ref 3.87–5.11)
RDW: 14.5 % (ref 11.5–15.5)
WBC: 6 10*3/uL (ref 4.0–10.5)
nRBC: 0 % (ref 0.0–0.2)

## 2018-12-09 LAB — COMPREHENSIVE METABOLIC PANEL
ALT: 20 U/L (ref 0–44)
AST: 23 U/L (ref 15–41)
Albumin: 3.8 g/dL (ref 3.5–5.0)
Alkaline Phosphatase: 113 U/L (ref 38–126)
Anion gap: 9 (ref 5–15)
BUN: 7 mg/dL — ABNORMAL LOW (ref 8–23)
CO2: 25 mmol/L (ref 22–32)
Calcium: 9.1 mg/dL (ref 8.9–10.3)
Chloride: 105 mmol/L (ref 98–111)
Creatinine, Ser: 0.46 mg/dL (ref 0.44–1.00)
GFR calc Af Amer: >60 mL/min (ref >60)
GFR calc non Af Amer: >60 mL/min (ref >60)
Glucose, Bld: 122 mg/dL — ABNORMAL HIGH (ref 70–99)
Potassium: 3.7 mmol/L (ref 3.5–5.1)
Sodium: 139 mmol/L (ref 135–145)
Total Bilirubin: 0.5 mg/dL (ref 0.3–1.2)
Total Protein: 7 g/dL (ref 6.5–8.1)

## 2018-12-09 MED ORDER — SODIUM CHLORIDE 0.9 % IV SOLN
Freq: Once | INTRAVENOUS | Status: AC
  Administered 2018-12-09: 10:00:00 via INTRAVENOUS
  Filled 2018-12-09: qty 250

## 2018-12-09 MED ORDER — DIPHENHYDRAMINE HCL 50 MG/ML IJ SOLN
50.0000 mg | Freq: Once | INTRAMUSCULAR | Status: AC
  Administered 2018-12-09: 50 mg via INTRAVENOUS
  Filled 2018-12-09: qty 1

## 2018-12-09 MED ORDER — SODIUM CHLORIDE 0.9 % IV SOLN
80.0000 mg/m2 | Freq: Once | INTRAVENOUS | Status: AC
  Administered 2018-12-09: 156 mg via INTRAVENOUS
  Filled 2018-12-09: qty 26

## 2018-12-09 MED ORDER — SODIUM CHLORIDE 0.9 % IV SOLN
20.0000 mg | Freq: Once | INTRAVENOUS | Status: AC
  Administered 2018-12-09: 20 mg via INTRAVENOUS
  Filled 2018-12-09: qty 2

## 2018-12-09 MED ORDER — FAMOTIDINE IN NACL 20-0.9 MG/50ML-% IV SOLN
20.0000 mg | Freq: Once | INTRAVENOUS | Status: AC
  Administered 2018-12-09: 20 mg via INTRAVENOUS
  Filled 2018-12-09: qty 50

## 2018-12-09 MED ORDER — HEPARIN SOD (PORK) LOCK FLUSH 100 UNIT/ML IV SOLN
500.0000 [IU] | Freq: Once | INTRAVENOUS | Status: AC | PRN
  Administered 2018-12-09: 500 [IU]
  Filled 2018-12-09: qty 5

## 2018-12-09 NOTE — Progress Notes (Signed)
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 Patient underwent lumpectomy and sentinel lymph node biopsy in February 2020. Pathology showed invasive lobular carcinoma with positive medial margin which was reexcised with still positive. Tumor was 24 mm, grade 2 ER PR positive and HER-2/neu negative. One sentinel lymph node that was excised was positive for macro met of 8 mm. No extranodal extension was present. MammaPrint came back as high risk with an average 10-year risk of untreated disease is 29%. Patient then underwent a bilateral MRI which showed additional suspicious non-mass enhancement along the inferior margin of the biopsy cavity extending 2 cm highly suspicious for continuous disease and another highly suspicious linear non-mass enhancement at the base of the nipple concerning for multifocal multicentric disease. Patient underwent right mastectomy and axillary lymph node dissectionon 09/20/2018. There was no residual invasive mammary carcinoma within the breast. However 15 out of 20 lymph nodes were involved with metastatic carcinoma measuring up to 6 mm and remaining 5  lymph nodes with isolated tumor cells. Pathologic stage was [id number]  Despite having surgery in May 2020 which was a second surgery-patient has not been able to start adjuvant chemotherapy. Her mastectomy was complicated by flap necrosis requiring debridement antibiotics and she had to be taken back to the OR for the same. Given her age and postmastectomy complications and delayed wound healing-weekly Taxol x12 cycles was chosen as the regimen for her adjuvant chemotherapy which was started on 12/02/2018  Interval history-she continues to have right upper extremity pain and swelling for which she is wearing a lymphedema sleeve.  First cycle of chemotherapy went well without any significant side effects.  Her right chest wall mastectomy wound is healing slowly and she does not report any concerns with the wound healing  ECOG PS- 1 Pain scale- 0 Opioid associated constipation- no  Review of systems- Review of Systems  Constitutional: Negative for chills, fever, malaise/fatigue and weight loss.  HENT: Negative for congestion, ear discharge and nosebleeds.   Eyes: Negative for blurred vision.  Respiratory: Negative for cough, hemoptysis, sputum production, shortness of breath and wheezing.   Cardiovascular: Negative for chest pain, palpitations, orthopnea and claudication.  Gastrointestinal: Negative for abdominal pain, blood in stool, constipation, diarrhea, heartburn, melena, nausea and vomiting.  Genitourinary: Negative for dysuria, flank pain, frequency, hematuria and urgency.  Musculoskeletal: Negative for back pain, joint pain and myalgias.       Right upper extremity pain and swelling  Skin: Negative for rash.  Neurological: Negative for dizziness, tingling, focal weakness, seizures, weakness and headaches.  Endo/Heme/Allergies: Does not bruise/bleed easily.  Psychiatric/Behavioral: Negative for depression and suicidal ideas. The patient does not have insomnia.       No Known  Allergies   Past  Medical History:  Diagnosis Date   Arthritis    Breast cancer ([place])    Diverticulitis    Family history of breast cancer    GERD (gastroesophageal reflux disease)    Hyperlipidemia    Hypertension      Past Surgical History:  Procedure Laterality Date   BREAST BIOPSY Right 05/28/2018   [place] bx, INVASIVE MAMMARY CARCINOMA WITH LOBULAR FEATURES   BREAST LUMPECTOMY Right 06/12/2018   [place], lobular features   BREAST LUMPECTOMY WITH SENTINEL LYMPH NODE BIOPSY Right 06/12/2018   Procedure: RIGHT BREAST WIDE EXCISION WITH SENTINEL LYMPH NODE BX;  Surgeon: Robert Bellow, MD;  Location: [facility] ORS;  Service: General;  Laterality: Right;   [place] AND DRAINAGE OF WOUND Right 10/30/2018   Procedure: IRRIGATION AND DEBRIDEMENT RIGHT CHEST WALL WOUND;  Surgeon: Robert Bellow, MD;  Location: [facility] ORS;  Service: General;  Laterality: Right;   JOINT REPLACEMENT     MASTECTOMY WITH AXILLARY LYMPH NODE DISSECTION Right 09/20/2018   Procedure: MASTECTOMY WITH AXILLARY LYMPH NODE DISSECTION RIGHT;  Surgeon: Robert Bellow, MD;  Location: [facility] ORS;  Service: General;  Laterality: Right;   ovaraian cyst removal Right    Cyst only (NOT OVARY)   PORTACATH PLACEMENT Left 09/20/2018   Procedure: INSERTION PORT-A-CATH, LEFT;  Surgeon: Robert Bellow, MD;  Location: [facility] ORS;  Service: General;  Laterality: Left;   TOTAL KNEE ARTHROPLASTY Bilateral 05/05/2010    Social History   Socioeconomic History   Marital status: Single    Spouse name: Not on file   Number of children: Not on file   Years of education: Not on file   Highest education level: Not on file  Occupational History   Occupation: retired  Scientist, product/process development strain: Not hard at all   Food insecurity    Worry: Never true    Inability: Never true   Transportation needs    Medical: No    Non-medical: No  Tobacco Use    Smoking status: Never Smoker   Smokeless tobacco: Never Used  Substance and Sexual Activity   Alcohol use: No    Alcohol/week: 0.0 standard drinks   Drug use: No   Sexual activity: Never  Lifestyle   Physical activity    Days per week: Not on file    Minutes per session: Not on file   Stress: Not at all  Relationships   Social connections    Talks on phone: Not on file    Gets together: Not on file    Attends religious service: Not on file    Active member of club or organization: Not on file    Attends meetings of clubs or organizations: Not on file    Relationship status: Not on file   Intimate partner violence    Fear of current or ex partner: Not on file    Emotionally abused: Not on file    Physically abused: Not on file    Forced sexual activity: Not on file  Other Topics Concern   Not on file  Social History Narrative   Retired    Lives by herself    Pets: None   Caffeine- Coffee 2 cups daily, no tea/soda       Family History  Problem Relation Age of Onset   Alcohol abuse Mother        deceased 30   Diabetes Father  Breast cancer Maternal Aunt        dx 17s; deceased 41s   Breast cancer Maternal Aunt        dx 30s; deceased 27s     Current Outpatient Medications:    amLODipine (NORVASC) 5 MG tablet, TAKE 1 TABLET BY MOUTH  DAILY, Disp: 90 tablet, Rfl: 1   atorvastatin (LIPITOR) 80 MG tablet, TAKE 1 TABLET BY MOUTH  DAILY, Disp: 90 tablet, Rfl: 1   CALCIUM-VITAMIN D PO, Take 1 tablet by mouth daily. , Disp: , Rfl:    CRANBERRY PO, Take 1 tablet by mouth 2 (two) times daily. , Disp: , Rfl:    Multiple Vitamins tablet, Take 1 tablet by mouth daily. , Disp: , Rfl:    Omega-3 Fatty Acids (FISH OIL PO), Take 1 capsule by mouth daily. , Disp: , Rfl:    oxybutynin (DITROPAN) 5 MG tablet, TAKE 1 TABLET BY MOUTH TWO  TIMES DAILY, Disp: 180 tablet, Rfl: 1   pantoprazole (PROTONIX) 40 MG tablet, TAKE 1 TABLET BY MOUTH  DAILY, Disp: 90  tablet, Rfl: 1   potassium chloride (K-DUR) 10 MEQ tablet, TAKE 1 TABLET BY MOUTH  DAILY, Disp: 90 tablet, Rfl: 1   torsemide (DEMADEX) 5 MG tablet, TAKE 1 TABLET BY MOUTH  DAILY, Disp: 90 tablet, Rfl: 1   Turmeric 500 MG CAPS, Take 1,000 mg by mouth 2 (two) times daily. , Disp: , Rfl:    anastrozole (ARIMIDEX) 1 MG tablet, Take 1 tablet (1 mg total) by mouth daily., Disp: 30 tablet, Rfl: 3   cefadroxil (DURICEF) 500 MG capsule, Take 1 capsule (500 mg total) by mouth 2 (two) times daily., Disp: 20 capsule, Rfl: 0   sulfamethoxazole-trimethoprim (BACTRIM DS) 800-160 MG tablet, Take 1 tablet by mouth 2 (two) times daily., Disp: 20 tablet, Rfl: 0  Current Facility-Administered Medications:    piperacillin-tazobactam (ZOSYN) IVPB 3.375 g, 3.375 g, Intravenous, Once, Byrnett, Forest Gleason, MD  Facility-Administered Medications Ordered in Other Visits:    dexamethasone (DECADRON) 20 mg in sodium chloride 0.9 % 50 mL IVPB, 20 mg, Intravenous, Once, Sindy Guadeloupe, MD   famotidine (PEPCID) IVPB 20 mg premix, 20 mg, Intravenous, Once, Sindy Guadeloupe, MD, Last Rate: 100 mL/hr at 12/09/18 1018, 20 mg at 12/09/18 1018   heparin lock flush 100 unit/mL, 500 Units, Intracatheter, Once PRN, Sindy Guadeloupe, MD   PACLitaxel (TAXOL) 156 mg in sodium chloride 0.9 % 250 mL chemo infusion (</= 59m/m2), 80 mg/m2 (Treatment Plan Recorded), Intravenous, Once, RSindy Guadeloupe MD  Physical exam:  Vitals:   12/09/18 0915  BP: 118/88  Pulse: 91  Temp: 97.8 F (36.6 C)  Weight: 184 lb (83.5 kg)  Height: '5\' 7"'  (1.702 m)   Physical Exam Constitutional:      Comments: Elderly woman who ambulates with a cane.  Appears in no acute distress  HENT:     Head: Normocephalic and atraumatic.  Eyes:     Pupils: Pupils are equal, round, and reactive to light.  Neck:     Musculoskeletal: Normal range of motion.  Cardiovascular:     Rate and Rhythm: Normal rate and regular rhythm.     Heart sounds: Normal heart  sounds.  Pulmonary:     Effort: Pulmonary effort is normal.     Breath sounds: Normal breath sounds.  Abdominal:     General: Bowel sounds are normal.     Palpations: Abdomen is soft.  Skin:    General: Skin is  warm and dry.  Neurological:     Mental Status: She is alert and oriented to person, place, and time.      CMP Latest Ref Rng & Units 12/09/2018  Glucose 70 - 99 mg/dL 122(H)  BUN 8 - 23 mg/dL 7(L)  Creatinine 0.44 - 1.00 mg/dL 0.46  Sodium 135 - 145 mmol/L 139  Potassium 3.5 - 5.1 mmol/L 3.7  Chloride 98 - 111 mmol/L 105  CO2 22 - 32 mmol/L 25  Calcium 8.9 - 10.3 mg/dL 9.1  Total Protein 6.5 - 8.1 g/dL 7.0  Total Bilirubin 0.3 - 1.2 mg/dL 0.5  Alkaline Phos 38 - 126 U/L 113  AST 15 - 41 U/L 23  ALT 0 - 44 U/L 20   CBC Latest Ref Rng & Units 12/09/2018  WBC 4.0 - 10.5 K/uL 6.0  Hemoglobin 12.0 - 15.0 g/dL 12.8  Hematocrit 36.0 - 46.0 % 39.6  Platelets 150 - 400 K/uL 267      Assessment and plan- Patient is a 78 y.o. female with invasive mammary carcinoma of the right breast pathological prognostic stage III [id number] cNX ER PR positive HER-2/neu negative status post right mastectomy and axillary lymph node dissection.  She is here for on treatment assessment prior to cycle 2 of weekly Taxol chemotherapy  Counts okay to proceed with cycle 2 of weekly Taxol chemotherapy today.  She will directly proceed for cycle 3 next week and I will see her back in 2 weeks time for cycle 4 of weekly Taxol chemotherapy  Right chest wall wound is healing well.  No signs of active infection or cellulitis.  Continue to monitor   Visit Diagnosis 1. Malignant neoplasm of upper-outer quadrant of right breast in female, estrogen receptor positive ([place])   2. Encounter for antineoplastic chemotherapy      Dr. Randa Evens, MD, MPH [facility] [phone] 12/09/2018 10:19 AM

## 2018-12-10 ENCOUNTER — Ambulatory Visit: Payer: Medicare Other | Admitting: Occupational Therapy

## 2018-12-10 DIAGNOSIS — I972 Postmastectomy lymphedema syndrome: Secondary | ICD-10-CM | POA: Diagnosis not present

## 2018-12-10 DIAGNOSIS — L905 Scar conditions and fibrosis of skin: Secondary | ICD-10-CM | POA: Diagnosis not present

## 2018-12-10 NOTE — Patient Instructions (Signed)
Add for pt to do AAROM for shoulder flexion on wall  Pain free - slight pull under arm on chest Cont with scar massage and soft tissue  And comprex foam on chest and under arm  And tubigrip compression  Until compression sleeve comes in - to call me to assess on fit

## 2018-12-10 NOTE — Therapy (Signed)
[facility] [address], [place], [address] Phone: [phone]   Fax:  [phone]  Occupational Therapy Treatment  Patient Details  Name: Theresa Reid MRN: [id number] Date of Birth: 02-Sep-1940 Referring Provider (OT): Avon Gully   Encounter Date: 12/10/2018  OT End of Session - 12/10/18 1149    Visit Number  4    Number of Visits  12    Date for OT Re-Evaluation  12/31/18    OT Start Time  1012    OT Stop Time  1051    OT Time Calculation (min)  39 min    Activity Tolerance  Patient tolerated treatment well    Behavior During Therapy  [facility] for tasks assessed/performed       Past Medical History:  Diagnosis Date  . Arthritis   . Breast cancer ([place])   . Diverticulitis   . Family history of breast cancer   . GERD (gastroesophageal reflux disease)   . Hyperlipidemia   . Hypertension     Past Surgical History:  Procedure Laterality Date  . BREAST BIOPSY Right 05/28/2018   [place] b[place], INVASIVE MAMMARY CARCINOMA WITH LOBULAR FEATURES  . BREAST LUMPECTOMY Right 06/12/2018   [facility], lobular features  . BREAST LUMPECTOMY WITH SENTINEL LYMPH NODE BIOPSY Right 06/12/2018   Procedure: RIGHT BREAST WIDE EXCISION WITH SENTINEL LYMPH NODE BX;  Surgeon: Robert Bellow, MD;  Location: [facility] OR[facility];  Service: General;  Laterality: Right;  . CHOLECYSTECTOMY    . [place]  . INCISION AND DRAINAGE OF WOUND Right 10/30/2018   Procedure: IRRIGATION AND DEBRIDEMENT RIGHT CHEST WALL WOUND;  Surgeon: Robert Bellow, MD;  Location: [facility] OR[facility];  Service: General;  Laterality: Right;  . JOINT REPLACEMENT    . MASTECTOMY WITH AXILLARY LYMPH NODE DISSECTION Right 09/20/2018   Procedure: MASTECTOMY WITH AXILLARY LYMPH NODE DISSECTION RIGHT;  Surgeon: Robert Bellow, MD;  Location: [facility] OR[facility];  Service: General;  Laterality: Right;  . ovaraian cyst removal Right    Cyst only (NOT OVARY)  . PORTACATH PLACEMENT  Left 09/20/2018   Procedure: INSERTION PORT-A-CATH, LEFT;  Surgeon: Robert Bellow, MD;  Location: [facility] OR[facility];  Service: General;  Laterality: Left;  . TOTAL KNEE ARTHROPLASTY Bilateral 05/05/2010    There were no vitals filed for this visit.  Subjective Assessment - 12/10/18 1146    Subjective   My arm is some days swollen more than other days and under my arm- I have been doing the massage and scar/soft tissue massage and I can tell if it softer and I can reach higher with my shoulder - not as tight    Pertinent History  Patient had her original lumpectomy and sentinel lymph node biopsy back in February 2020 R breast   She then had an MRI which showed more areas of enhancement and was taken back to the OR for mastectomy and axillary lymph node dissection on 09/20/2018 which showed 15 of the lymph nodes were also positive for malignancy and this upstaged her to stage IIIa disease.  Plan was to offer her adjuvant chemotherapy following this.  However her mastectomy course was complicated by flap necrosis requiring debridement antibiotics and she had to be also taken to the OR on 10/30/2018.  Her wound has not completely healed yet and she still has a drain in place.  She seen  Dr. Dahlia Byes on 11/18/2018.  There is concern for early cellulitis of her chest wall.  Dr. Dahlia Byes after his visit started  patient on Augmentin for 2 more weeks which further delays her chemotherapy. Lymphedema in R UE per pt developed after surgery on 10/30/2018 - stayed the same - not going down - denies pain    Patient Stated Goals  I want to get sleeve so my arm don't  get big and to  prevent infection    Currently in Pain?  No/denies          LYMPHEDEMA/ONCOLOGY QUESTIONNAIRE - 12/10/18 1014      Right Upper Extremity Lymphedema   15 cm Proximal to Olecranon Process  39.5 cm    10 cm Proximal to Olecranon Process  38.5 cm    Olecranon Process  25.5 cm    15 cm Proximal to Ulnar Styloid Process  22.2 cm    10 cm Proximal to  Ulnar Styloid Process  19 cm    Just Proximal to Ulnar Styloid Process  15.5 cm    Across Hand at [employer]  17.3 cm    At [place] of 2nd Digit  5.5 cm    At [facility] of Thumb  6 cm        assess circumference in R UE - pt arrive with her tubigrip , soft tubigrip and isotoner glove on - pt wore it most all the time since 2 wks ago -did get measured for custom [employer] el[employer]x soft sleeve and glove  Pt to contact me if it comes in   Assess scar and chest wall fibrosis - improving and scar loosening up - pt to cont with soft tissue and scar mobs Area that was macerated and wet in one fold lateral - dryer  Cot with komprex foam on chest wall - until jovipak breast pad arrive    Pt report increase shoulder AROM - less pull in chest  Flexion 110 and ABD 100  Add AAROM on wall for shoulder flexion for pt - feel pull under arm on chest wall    Cut her new temporary tubigrip soft and size E for R UE until custom sleeve                OT Education - 12/10/18 1148    Education Details  wearing of temp compression - and pect stretch , scapula retraction -add to HEP this date shoulder flexion AAROM - cont with some soft tissue and scar mobs    Person(s) Educated  Patient    Methods  Explanation;Demonstration;Tactile cues;Verbal cues;Handout    Comprehension  Returned demonstration;Verbalized understanding       OT Short Term Goals - 11/19/18 1743      OT SHORT TERM GOAL #1   Title  Pt's R UE circumference decrease by at least 1cm -2 cm from hand to upper arm - to be fitted for appropriate compression garments    Baseline  increase 2.7 c m at upperarm ; 1.7cm elbow, 1.5cm  forearm , 2.5cm  wrist and hand 2 cm    Time  3    Period  Weeks    Status  New    Target Date  12/10/18        OT Long Term Goals - 11/19/18 1746      OT LONG TERM GOAL #1   Title  Pt to be fitted wth correct compression for R UE and if indiicated chest to decrease lymphedema and prevent cellulitis     Baseline  no compression - cellulitis of chest -  flap - on antibiotics since yesterday    Time  6    Period  Weeks    Status  New    Target Date  12/31/18            Plan - 12/10/18 1150    Clinical Impression Statement  Pt started chemo since seen 2 wks ago and got measured form [employer] el[employer]x soft sleeve and glove -as well as jovipak - pt to call when it comes in for OT to asssess fit - and to wear it then during day -and tubigrip at night if arm wants to increase in size - Jovipak on chest under arm to spine on R - pt scar tissue and soft tissue fibrosis improving and show increase shoulder AROM flexion 110 and ABD 100 - pt to have arthritis in shoulders per pt    OT Occupational Profile and History  Problem Focused Assessment - Including review of records relating to presenting problem    Occupational performance deficits (Please refer to evaluation for details):  ADL's;Play;Leisure;IADL's    Body Structure / Function / Physical Skills  ADL;UE functional use;Scar mobility;Skin integrity;Edema    Rehab Potential  Good    Clinical Decision Making  Several treatment options, min-mod task modification necessary    Comorbidities Affecting Occupational Performance:  May have comorbidities impacting occupational performance    Modification or Assistance to Complete Evaluation   No modification of tasks or assist necessary to complete eval    OT Frequency  --   1-2 x wk   OT Duration  4 weeks    OT Treatment/Interventions  Self-care/ADL training;Manual lymph drainage;Therapeutic exercise;Patient/family education;Scar mobilization;Manual Therapy    Plan  assess fit for compression sleeve and measurements of R UE    OT Home Exercise Plan  see pt instruction    Consulted and Agree with Plan of Care  Patient       Patient will benefit from skilled therapeutic intervention in order to improve the following deficits and impairments:   Body Structure / Function / Physical Skills: ADL, UE  functional use, Scar mobility, Skin integrity, Edema       Visit Diagnosis: 1. Scar condition and fibrosis of skin   2. Postmastectomy lymphedema syndrome       Problem List Patient Active Problem List   Diagnosis Date Noted  . Infected skin flap 10/09/2018  . Cataract 09/24/2018  . Urge incontinence of urine 09/24/2018  . Malignant neoplasm of right female breast ([place]) 06/07/2018  . Goals of care, counseling/discussion 06/07/2018  . Family history of breast cancer   . Dairy product intolerance 01/14/2018  . Seborrheic keratosis 04/10/2017  . Dysuria 02/19/2017  . Visual disturbance 10/03/2016  . GERD (gastroesophageal reflux disease) 09/14/2016  . HLD (hyperlipidemia) 08/13/2015  . Elevated alkaline phosphatase level 02/14/2015  . Hypertension 11/13/2014  . 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    Rosalyn Gess OTR/l,CLT 12/10/2018, 11:54 AM  [place] [address] [place], [address] Phone: [phone]   Fax:  [phone]  Name: KINDEL ROCHEFORT MRN: [id number] Date of Birth: Aug 25, 1940

## 2018-12-13 ENCOUNTER — Other Ambulatory Visit: Payer: Self-pay

## 2018-12-16 ENCOUNTER — Other Ambulatory Visit: Payer: Self-pay

## 2018-12-16 ENCOUNTER — Inpatient Hospital Stay: Payer: Medicare Other

## 2018-12-16 VITALS — BP 121/72 | HR 74 | Temp 97.6°F | Wt 182.4 lb

## 2018-12-16 DIAGNOSIS — Z170 Estrogen receptor positive status [ER+]: Secondary | ICD-10-CM | POA: Diagnosis not present

## 2018-12-16 DIAGNOSIS — Z79899 Other long term (current) drug therapy: Secondary | ICD-10-CM | POA: Diagnosis not present

## 2018-12-16 DIAGNOSIS — Z5111 Encounter for antineoplastic chemotherapy: Secondary | ICD-10-CM | POA: Diagnosis not present

## 2018-12-16 DIAGNOSIS — C50911 Malignant neoplasm of unspecified site of right female breast: Secondary | ICD-10-CM

## 2018-12-16 DIAGNOSIS — Z9011 Acquired absence of right breast and nipple: Secondary | ICD-10-CM | POA: Diagnosis not present

## 2018-12-16 DIAGNOSIS — Z95828 Presence of other vascular implants and grafts: Secondary | ICD-10-CM

## 2018-12-16 DIAGNOSIS — C50411 Malignant neoplasm of upper-outer quadrant of right female breast: Secondary | ICD-10-CM | POA: Diagnosis not present

## 2018-12-16 DIAGNOSIS — R1032 Left lower quadrant pain: Secondary | ICD-10-CM | POA: Diagnosis not present

## 2018-12-16 LAB — COMPREHENSIVE METABOLIC PANEL
ALT: 19 U/L (ref 0–44)
AST: 18 U/L (ref 15–41)
Albumin: 3.7 g/dL (ref 3.5–5.0)
Alkaline Phosphatase: 105 U/L (ref 38–126)
Anion gap: 10 (ref 5–15)
BUN: <5 mg/dL — ABNORMAL LOW (ref 8–23)
CO2: 26 mmol/L (ref 22–32)
Calcium: 8.8 mg/dL — ABNORMAL LOW (ref 8.9–10.3)
Chloride: 106 mmol/L (ref 98–111)
Creatinine, Ser: 0.53 mg/dL (ref 0.44–1.00)
GFR calc Af Amer: >60 mL/min (ref >60)
GFR calc non Af Amer: >60 mL/min (ref >60)
Glucose, Bld: 109 mg/dL — ABNORMAL HIGH (ref 70–99)
Potassium: 3.4 mmol/L — ABNORMAL LOW (ref 3.5–5.1)
Sodium: 142 mmol/L (ref 135–145)
Total Bilirubin: 0.6 mg/dL (ref 0.3–1.2)
Total Protein: 7.1 g/dL (ref 6.5–8.1)

## 2018-12-16 LAB — CBC WITH DIFFERENTIAL/PLATELET
Abs Immature Granulocytes: 0.05 10*3/uL (ref 0.00–0.07)
Basophils Absolute: 0 10*3/uL (ref 0.0–0.1)
Basophils Relative: 1 %
Eosinophils Absolute: 0.1 10*3/uL (ref 0.0–0.5)
Eosinophils Relative: 2 %
HCT: 38.7 % (ref 36.0–46.0)
Hemoglobin: 12.7 g/dL (ref 12.0–15.0)
Immature Granulocytes: 1 %
Lymphocytes Relative: 41 %
Lymphs Abs: 1.8 10*3/uL (ref 0.7–4.0)
MCH: 29.1 pg (ref 26.0–34.0)
MCHC: 32.8 g/dL (ref 30.0–36.0)
MCV: 88.6 fL (ref 80.0–100.0)
Monocytes Absolute: 0.4 10*3/uL (ref 0.1–1.0)
Monocytes Relative: 9 %
Neutro Abs: 2 10*3/uL (ref 1.7–7.7)
Neutrophils Relative %: 46 %
Platelets: 258 10*3/uL (ref 150–400)
RBC: 4.37 MIL/uL (ref 3.87–5.11)
RDW: 14.6 % (ref 11.5–15.5)
WBC: 4.3 10*3/uL (ref 4.0–10.5)
nRBC: 0 % (ref 0.0–0.2)

## 2018-12-16 MED ORDER — SODIUM CHLORIDE 0.9 % IV SOLN
Freq: Once | INTRAVENOUS | Status: AC
  Administered 2018-12-16: 11:00:00 via INTRAVENOUS
  Filled 2018-12-16: qty 250

## 2018-12-16 MED ORDER — SODIUM CHLORIDE 0.9% FLUSH
10.0000 mL | Freq: Once | INTRAVENOUS | Status: AC
  Administered 2018-12-16: 10 mL via INTRAVENOUS
  Filled 2018-12-16: qty 10

## 2018-12-16 MED ORDER — DIPHENHYDRAMINE HCL 50 MG/ML IJ SOLN
50.0000 mg | Freq: Once | INTRAMUSCULAR | Status: AC
  Administered 2018-12-16: 50 mg via INTRAVENOUS
  Filled 2018-12-16: qty 1

## 2018-12-16 MED ORDER — HEPARIN SOD (PORK) LOCK FLUSH 100 UNIT/ML IV SOLN
500.0000 [IU] | Freq: Once | INTRAVENOUS | Status: AC | PRN
  Administered 2018-12-16: 500 [IU]
  Filled 2018-12-16: qty 5

## 2018-12-16 MED ORDER — SODIUM CHLORIDE 0.9 % IV SOLN
20.0000 mg | Freq: Once | INTRAVENOUS | Status: AC
  Administered 2018-12-16: 20 mg via INTRAVENOUS
  Filled 2018-12-16: qty 2

## 2018-12-16 MED ORDER — SODIUM CHLORIDE 0.9 % IV SOLN
80.0000 mg/m2 | Freq: Once | INTRAVENOUS | Status: AC
  Administered 2018-12-16: 156 mg via INTRAVENOUS
  Filled 2018-12-16: qty 26

## 2018-12-16 MED ORDER — FAMOTIDINE IN NACL 20-0.9 MG/50ML-% IV SOLN
20.0000 mg | Freq: Once | INTRAVENOUS | Status: AC
  Administered 2018-12-16: 20 mg via INTRAVENOUS

## 2018-12-19 ENCOUNTER — Ambulatory Visit: Payer: Medicare Other | Admitting: Occupational Therapy

## 2018-12-19 ENCOUNTER — Other Ambulatory Visit: Payer: Self-pay

## 2018-12-19 DIAGNOSIS — L905 Scar conditions and fibrosis of skin: Secondary | ICD-10-CM | POA: Diagnosis not present

## 2018-12-19 DIAGNOSIS — I972 Postmastectomy lymphedema syndrome: Secondary | ICD-10-CM | POA: Diagnosis not present

## 2018-12-19 NOTE — Patient Instructions (Signed)
Pt to get Juzo slippy gator to use to donn sleeve and gloves helps gripping  Ed again on donning and washing glove - it still cause numbness in week - will have them re measure

## 2018-12-19 NOTE — Therapy (Signed)
[facility] [address] [place], [address] Phone: [phone]   Fax:  [phone]  Occupational Therapy Treatment  Patient Details  Name: Theresa Reid MRN: [id number] Date of Birth: 1941/04/04 Referring Provider (OT): Avon Gully   Encounter Date: 12/19/2018  OT End of Session - 12/19/18 1049    Visit Number  5    Number of Visits  12    Date for OT Re-Evaluation  12/31/18    OT Start Time  1000    OT Stop Time  1033    OT Time Calculation (min)  33 min    Activity Tolerance  Patient tolerated treatment well    Behavior During Therapy  [facility] for tasks assessed/performed       Past Medical History:  Diagnosis Date  . Arthritis   . Breast cancer ([place])   . Diverticulitis   . Family history of breast cancer   . GERD (gastroesophageal reflux disease)   . Hyperlipidemia   . Hypertension     Past Surgical History:  Procedure Laterality Date  . BREAST BIOPSY Right 05/28/2018   [place] bx, INVASIVE MAMMARY CARCINOMA WITH LOBULAR FEATURES  . BREAST LUMPECTOMY Right 06/12/2018   [facility], lobular features  . BREAST LUMPECTOMY WITH SENTINEL LYMPH NODE BIOPSY Right 06/12/2018   Procedure: RIGHT BREAST WIDE EXCISION WITH SENTINEL LYMPH NODE BX;  Surgeon: Robert Bellow, MD;  Location: [facility] ORS;  Service: General;  Laterality: Right;  . CHOLECYSTECTOMY    . [place]  . INCISION AND DRAINAGE OF WOUND Right 10/30/2018   Procedure: IRRIGATION AND DEBRIDEMENT RIGHT CHEST WALL WOUND;  Surgeon: Robert Bellow, MD;  Location: [facility] ORS;  Service: General;  Laterality: Right;  . JOINT REPLACEMENT    . MASTECTOMY WITH AXILLARY LYMPH NODE DISSECTION Right 09/20/2018   Procedure: MASTECTOMY WITH AXILLARY LYMPH NODE DISSECTION RIGHT;  Surgeon: Robert Bellow, MD;  Location: [facility] ORS;  Service: General;  Laterality: Right;  . ovaraian cyst removal Right    Cyst only (NOT OVARY)  . PORTACATH PLACEMENT  Left 09/20/2018   Procedure: INSERTION PORT-A-CATH, LEFT;  Surgeon: Robert Bellow, MD;  Location: [facility] ORS;  Service: General;  Laterality: Left;  . TOTAL KNEE ARTHROPLASTY Bilateral 05/05/2010    There were no vitals filed for this visit.  Subjective Assessment - 12/19/18 1047    Subjective   I tried to stretch the glove - but still very tight- my fingers goes numb after about 2 hrs - but otherwise my arm do not swell at night time - looks better in the am -and sleeve fits well - just hard to put on    Pertinent History  Patient had her original lumpectomy and sentinel lymph node biopsy back in February 2020 R breast   She then had an MRI which showed more areas of enhancement and was taken back to the OR for mastectomy and axillary lymph node dissection on 09/20/2018 which showed 15 of the lymph nodes were also positive for malignancy and this upstaged her to stage IIIa disease.  Plan was to offer her adjuvant chemotherapy following this.  However her mastectomy course was complicated by flap necrosis requiring debridement antibiotics and she had to be also taken to the OR on 10/30/2018.  Her wound has not completely healed yet and she still has a drain in place.  She seen  Dr. Dahlia Byes on 11/18/2018.  There is concern for early cellulitis of  her chest wall.  Dr. Dahlia Byes after his visit started  patient on Augmentin for 2 more weeks which further delays her chemotherapy. Lymphedema in R UE per pt developed after surgery on 10/30/2018 - stayed the same - not going down - denies pain    Patient Stated Goals  I want to get sleeve so my arm don't  get big and to  prevent infection    Currently in Pain?  No/denies          LYMPHEDEMA/ONCOLOGY QUESTIONNAIRE - 12/19/18 1014      Right Upper Extremity Lymphedema   15 cm Proximal to Olecranon Process  39 cm    10 cm Proximal to Olecranon Process  38.5 cm    Olecranon Process  26 cm    15 cm Proximal to Ulnar Styloid Process  22 cm    10 cm Proximal to  Ulnar Styloid Process  19 cm    Just Proximal to Ulnar Styloid Process  15 cm    Across Hand at [employer]  17.5 cm    At [place] of 2nd Digit  5.6 cm    At [facility] of Thumb  5.6 cm         Circumference of R UE doing very well - see flow sheet  pt arrive with her Jobst Elvarex sleeve and glove on  Sleeve not pull up all the way and glove pt report is causing some numbness after 2 hrs of wearing - pt to wash and stretch fingers - again this week - if still to tight - will have pt get remeasure   Pt only wore daytime sleeve and glove - with no night time sleeve and did very well   pt was ed on using Juzo slippy gator to donn sleeve and using gloves - that eases the grasping of sleeve      Assess scar and chest wall fibrosis - fibrosis improving very well  and scar loosening up - pt to cont with soft tissue and scar mobs Area that was macerated and wet in one fold lateral - dryer this date again  Cont  with komprex foam on chest wall - until jovipak breast pad arrive    Pt report increase shoulder AROM - with less pull in chest  Cont with  AAROM on wall for shoulder flexion for pt - feel pull under arm on chest wall no shoulder - pt do have arthritis in the shoulder   pt to follow up next week - and will check on glove             OT Education - 12/19/18 1049    Education Details  wearing of compression sleeve and glove - donning techniques    Person(s) Educated  Patient    Methods  Explanation;Demonstration;Tactile cues;Verbal cues;Handout    Comprehension  Returned demonstration;Verbalized understanding       OT Short Term Goals - 11/19/18 1743      OT SHORT TERM GOAL #1   Title  Pt's R UE circumference decrease by at least 1cm -2 cm from hand to upper arm - to be fitted for appropriate compression garments    Baseline  increase 2.7 c m at upperarm ; 1.7cm elbow, 1.5cm  forearm , 2.5cm  wrist and hand 2 cm    Time  3    Period  Weeks    Status  New    Target  Date  12/10/18  OT Long Term Goals - 11/19/18 1746      OT LONG TERM GOAL #1   Title  Pt to be fitted wth correct compression for R UE and if indiicated chest to decrease lymphedema and prevent cellulitis    Baseline  no compression - cellulitis of chest -  flap - on antibiotics since yesterday    Time  6    Period  Weeks    Status  New    Target Date  12/31/18            Plan - 12/19/18 1050    OT Occupational Profile and History  Problem Focused Assessment - Including review of records relating to presenting problem    Occupational performance deficits (Please refer to evaluation for details):  ADL's;Play;Leisure;IADL's    Body Structure / Function / Physical Skills  ADL;UE functional use;Scar mobility;Skin integrity;Edema    Rehab Potential  Good    Clinical Decision Making  Several treatment options, min-mod task modification necessary    Comorbidities Affecting Occupational Performance:  May have comorbidities impacting occupational performance    Modification or Assistance to Complete Evaluation   No modification of tasks or assist necessary to complete eval    OT Frequency  1x / week    OT Duration  2 weeks    OT Treatment/Interventions  Self-care/ADL training;Manual lymph drainage;Therapeutic exercise;Patient/family education;Scar mobilization;Manual Therapy    Plan  assess fit for compression sleeve/circumference   and if need to get remeasure for glove    OT Home Exercise Plan  see pt instruction    Consulted and Agree with Plan of Care  Patient       Patient will benefit from skilled therapeutic intervention in order to improve the following deficits and impairments:   Body Structure / Function / Physical Skills: ADL, UE functional use, Scar mobility, Skin integrity, Edema       Visit Diagnosis: Postmastectomy lymphedema syndrome  Scar condition and fibrosis of skin    Problem List Patient Active Problem List   Diagnosis Date Noted  . Infected  skin flap 10/09/2018  . Cataract 09/24/2018  . Urge incontinence of urine 09/24/2018  . Malignant neoplasm of right female breast ([place]) 06/07/2018  . Goals of care, counseling/discussion 06/07/2018  . Family history of breast cancer   . Dairy product intolerance 01/14/2018  . Seborrheic keratosis 04/10/2017  . Dysuria 02/19/2017  . Visual disturbance 10/03/2016  . GERD (gastroesophageal reflux disease) 09/14/2016  . HLD (hyperlipidemia) 08/13/2015  . Elevated alkaline phosphatase level 02/14/2015  . Hypertension 11/13/2014  . 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    Rosalyn Gess OTR/L,CLT 12/19/2018, 12:31 PM  [facility] [address] [place], [address] Phone: [phone]   Fax:  [phone]  Name: Theresa Reid MRN: [id number] Date of Birth: [birth year]

## 2018-12-23 ENCOUNTER — Inpatient Hospital Stay (HOSPITAL_BASED_OUTPATIENT_CLINIC_OR_DEPARTMENT_OTHER): Payer: Medicare Other | Admitting: Oncology

## 2018-12-23 ENCOUNTER — Other Ambulatory Visit: Payer: Self-pay | Admitting: *Deleted

## 2018-12-23 ENCOUNTER — Encounter: Payer: Self-pay | Admitting: Oncology

## 2018-12-23 ENCOUNTER — Telehealth: Payer: Self-pay | Admitting: *Deleted

## 2018-12-23 ENCOUNTER — Other Ambulatory Visit: Payer: Self-pay

## 2018-12-23 ENCOUNTER — Inpatient Hospital Stay: Payer: Medicare Other

## 2018-12-23 ENCOUNTER — Other Ambulatory Visit: Payer: Self-pay | Admitting: Oncology

## 2018-12-23 VITALS — BP 126/77 | HR 93 | Temp 98.3°F | Ht 65.0 in | Wt 184.0 lb

## 2018-12-23 DIAGNOSIS — C50411 Malignant neoplasm of upper-outer quadrant of right female breast: Secondary | ICD-10-CM

## 2018-12-23 DIAGNOSIS — R1032 Left lower quadrant pain: Secondary | ICD-10-CM | POA: Diagnosis not present

## 2018-12-23 DIAGNOSIS — Z9011 Acquired absence of right breast and nipple: Secondary | ICD-10-CM | POA: Diagnosis not present

## 2018-12-23 DIAGNOSIS — E876 Hypokalemia: Secondary | ICD-10-CM

## 2018-12-23 DIAGNOSIS — Z8719 Personal history of other diseases of the digestive system: Secondary | ICD-10-CM

## 2018-12-23 DIAGNOSIS — Z170 Estrogen receptor positive status [ER+]: Secondary | ICD-10-CM

## 2018-12-23 DIAGNOSIS — Z79899 Other long term (current) drug therapy: Secondary | ICD-10-CM | POA: Diagnosis not present

## 2018-12-23 DIAGNOSIS — R109 Unspecified abdominal pain: Secondary | ICD-10-CM

## 2018-12-23 DIAGNOSIS — C50911 Malignant neoplasm of unspecified site of right female breast: Secondary | ICD-10-CM

## 2018-12-23 DIAGNOSIS — Z5111 Encounter for antineoplastic chemotherapy: Secondary | ICD-10-CM | POA: Diagnosis not present

## 2018-12-23 DIAGNOSIS — T502X5A Adverse effect of carbonic-anhydrase inhibitors, benzothiadiazides and other diuretics, initial encounter: Secondary | ICD-10-CM

## 2018-12-23 LAB — COMPREHENSIVE METABOLIC PANEL
ALT: 20 U/L (ref 0–44)
AST: 19 U/L (ref 15–41)
Albumin: 3.6 g/dL (ref 3.5–5.0)
Alkaline Phosphatase: 100 U/L (ref 38–126)
Anion gap: 8 (ref 5–15)
BUN: 7 mg/dL — ABNORMAL LOW (ref 8–23)
CO2: 25 mmol/L (ref 22–32)
Calcium: 9.1 mg/dL (ref 8.9–10.3)
Chloride: 106 mmol/L (ref 98–111)
Creatinine, Ser: 0.47 mg/dL (ref 0.44–1.00)
GFR calc Af Amer: >60 mL/min (ref >60)
GFR calc non Af Amer: >60 mL/min (ref >60)
Glucose, Bld: 127 mg/dL — ABNORMAL HIGH (ref 70–99)
Potassium: 3.4 mmol/L — ABNORMAL LOW (ref 3.5–5.1)
Sodium: 139 mmol/L (ref 135–145)
Total Bilirubin: 0.6 mg/dL (ref 0.3–1.2)
Total Protein: 6.8 g/dL (ref 6.5–8.1)

## 2018-12-23 LAB — CBC WITH DIFFERENTIAL/PLATELET
Abs Immature Granulocytes: 0.04 10*3/uL (ref 0.00–0.07)
Basophils Absolute: 0 10*3/uL (ref 0.0–0.1)
Basophils Relative: 1 %
Eosinophils Absolute: 0.1 10*3/uL (ref 0.0–0.5)
Eosinophils Relative: 1 %
HCT: 37.3 % (ref 36.0–46.0)
Hemoglobin: 12.1 g/dL (ref 12.0–15.0)
Immature Granulocytes: 1 %
Lymphocytes Relative: 45 %
Lymphs Abs: 2.5 10*3/uL (ref 0.7–4.0)
MCH: 28.7 pg (ref 26.0–34.0)
MCHC: 32.4 g/dL (ref 30.0–36.0)
MCV: 88.4 fL (ref 80.0–100.0)
Monocytes Absolute: 0.4 10*3/uL (ref 0.1–1.0)
Monocytes Relative: 7 %
Neutro Abs: 2.6 10*3/uL (ref 1.7–7.7)
Neutrophils Relative %: 45 %
Platelets: 353 10*3/uL (ref 150–400)
RBC: 4.22 MIL/uL (ref 3.87–5.11)
RDW: 15.1 % (ref 11.5–15.5)
WBC: 5.6 10*3/uL (ref 4.0–10.5)
nRBC: 0 % (ref 0.0–0.2)

## 2018-12-23 MED ORDER — SODIUM CHLORIDE 0.9 % IV SOLN
80.0000 mg/m2 | Freq: Once | INTRAVENOUS | Status: AC
  Administered 2018-12-23: 156 mg via INTRAVENOUS
  Filled 2018-12-23: qty 26

## 2018-12-23 MED ORDER — HEPARIN SOD (PORK) LOCK FLUSH 100 UNIT/ML IV SOLN
500.0000 [IU] | Freq: Once | INTRAVENOUS | Status: AC
  Administered 2018-12-23: 500 [IU] via INTRAVENOUS
  Filled 2018-12-23: qty 5

## 2018-12-23 MED ORDER — SODIUM CHLORIDE 0.9 % IV SOLN
20.0000 mg | Freq: Once | INTRAVENOUS | Status: AC
  Administered 2018-12-23: 20 mg via INTRAVENOUS
  Filled 2018-12-23: qty 2

## 2018-12-23 MED ORDER — CIPROFLOXACIN HCL 500 MG PO TABS: 500.0000 mg | ORAL_TABLET | Freq: Two times a day (BID) | ORAL | 0 refills | Status: DC

## 2018-12-23 MED ORDER — FAMOTIDINE IN NACL 20-0.9 MG/50ML-% IV SOLN
20.0000 mg | Freq: Once | INTRAVENOUS | Status: AC
  Administered 2018-12-23: 10:00:00 20 mg via INTRAVENOUS
  Filled 2018-12-23: qty 50

## 2018-12-23 MED ORDER — METRONIDAZOLE 500 MG PO TABS: 500.0000 mg | ORAL_TABLET | Freq: Three times a day (TID) | ORAL | 0 refills | Status: DC

## 2018-12-23 MED ORDER — DIPHENHYDRAMINE HCL 50 MG/ML IJ SOLN
50.0000 mg | Freq: Once | INTRAMUSCULAR | Status: AC
  Administered 2018-12-23: 50 mg via INTRAVENOUS
  Filled 2018-12-23: qty 1

## 2018-12-23 MED ORDER — HEPARIN SOD (PORK) LOCK FLUSH 100 UNIT/ML IV SOLN
500.0000 [IU] | Freq: Once | INTRAVENOUS | Status: AC | PRN
  Administered 2018-12-23: 500 [IU]

## 2018-12-23 MED ORDER — SODIUM CHLORIDE 0.9% FLUSH
10.0000 mL | INTRAVENOUS | Status: DC | PRN
  Administered 2018-12-23: 10 mL via INTRAVENOUS
  Filled 2018-12-23: qty 10

## 2018-12-23 MED ORDER — SODIUM CHLORIDE 0.9 % IV SOLN
Freq: Once | INTRAVENOUS | Status: AC
  Administered 2018-12-23: 10:00:00 via INTRAVENOUS
  Filled 2018-12-23: qty 250

## 2018-12-23 MED ORDER — POTASSIUM CHLORIDE CRYS ER 10 MEQ PO TBCR: 20.0000 meq | EXTENDED_RELEASE_TABLET | Freq: Every day | ORAL | 1 refills | Status: DC

## 2018-12-23 NOTE — Progress Notes (Signed)
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 Patient underwent lumpectomy and sentinel lymph node biopsy in February 2020. Pathology showed invasive lobular carcinoma with positive medial margin which was reexcised with still positive. Tumor was 24 mm, grade 2 ER PR positive and HER-2/neu negative. One sentinel lymph node that was excised was positive for macro met of 8 mm. No extranodal extension was present. MammaPrint came back as high risk with an average 10-year risk of untreated disease is 29%. Patient then underwent a bilateral MRI which showed additional suspicious non-mass enhancement along the inferior margin of the biopsy cavity extending 2 cm highly suspicious for continuous disease and another highly suspicious linear non-mass enhancement at the base of the nipple concerning for multifocal multicentric disease. Patient underwent right mastectomy and axillary lymph node dissectionon 09/20/2018. There was no residual invasive mammary carcinoma within the breast. However 15 out of 20 lymph nodes were involved with metastatic carcinoma measuring up to 6 mm and remaining 5  lymph nodes with isolated tumor cells. Pathologic stage was [id number]  Despite having surgery in May 2020 which was a second surgery-patient has not been able to start adjuvant chemotherapy. Her mastectomy was complicated by flap necrosis requiring debridement antibiotics and she had to be taken back to the OR for the same. Given her age and postmastectomy complications and delayed wound healing-weekly Taxol x12 cycles was chosen as the regimen for her adjuvant chemotherapy which was started on 12/02/2018  Interval history-patient states she is having pain in her left lower quadrant.  Reports that she has had mild attacks of diverticulitis in the past and it is usually self-limited without needing antibiotics.  She has been having some diarrhea over the last 24 hours.  Denies any fever.  Denies any nausea or vomiting.  ECOG PS- 1 Pain scale- 0   Review of systems- Review of Systems  Constitutional: Positive for malaise/fatigue. Negative for chills, fever and weight loss.  HENT: Negative for congestion, ear discharge and nosebleeds.   Eyes: Negative for blurred vision.  Respiratory: Negative for cough, hemoptysis, sputum production, shortness of breath and wheezing.   Cardiovascular: Negative for chest pain, palpitations, orthopnea and claudication.  Gastrointestinal: Positive for abdominal pain. Negative for blood in stool, constipation, diarrhea, heartburn, melena, nausea and vomiting.  Genitourinary: Negative for dysuria, flank pain, frequency, hematuria and urgency.  Musculoskeletal: Negative for back pain, joint pain and myalgias.  Skin: Negative for rash.  Neurological: Negative for dizziness, tingling, focal weakness, seizures, weakness and headaches.  Endo/Heme/Allergies: Does not bruise/bleed easily.  Psychiatric/Behavioral: Negative for depression and suicidal ideas. The patient does not have insomnia.        No Known Allergies   Past Medical History:  Diagnosis Date  .  Arthritis   .  Breast cancer ([place])   . Diverticulitis   . Family history of breast cancer   . GERD (gastroesophageal reflux disease)   . Hyperlipidemia   . Hypertension      Past Surgical History:  Procedure Laterality Date  . BREAST BIOPSY Right 05/28/2018   [place] bx, INVASIVE MAMMARY CARCINOMA WITH LOBULAR FEATURES  . BREAST LUMPECTOMY Right 06/12/2018   [facility], lobular features  . BREAST LUMPECTOMY WITH SENTINEL LYMPH NODE BIOPSY Right 06/12/2018   Procedure: RIGHT BREAST WIDE EXCISION WITH SENTINEL LYMPH NODE BX;  Surgeon: Robert Bellow, MD;  Location: [facility] OR[facility];  Service: General;  Laterality: Right;  . CHOLECYSTECTOMY    . [place]  . INCISION AND DRAINAGE OF WOUND Right 10/30/2018   Procedure: IRRIGATION AND DEBRIDEMENT RIGHT CHEST WALL WOUND;  Surgeon: Robert Bellow, MD;  Location: [facility] ORS;  Service: General;  Laterality: Right;  . JOINT REPLACEMENT    . MASTECTOMY WITH AXILLARY LYMPH NODE DISSECTION Right 09/20/2018   Procedure: MASTECTOMY WITH AXILLARY LYMPH NODE DISSECTION RIGHT;  Surgeon: Robert Bellow, MD;  Location: [facility] ORS;  Service: General;  Laterality: Right;  . ovaraian cyst removal Right    Cyst only (NOT OVARY)  . PORTACATH PLACEMENT Left 09/20/2018   Procedure: INSERTION PORT-A-CATH, LEFT;  Surgeon: Robert Bellow, MD;  Location: [facility] ORS;  Service: General;  Laterality: Left;  . TOTAL KNEE ARTHROPLASTY Bilateral 05/05/2010    Social History   Socioeconomic History  . Marital status: Single    Spouse name: Not on file  . Number of children: Not on file  . Years of education: Not on file  . Highest education level: Not on file  Occupational History  . Occupation: retired  Scientific laboratory technician  . Financial resource strain: Not hard at all  . Food insecurity    Worry: Never true    Inability: Never true  . Transportation needs    Medical: No    Non-medical: No  Tobacco Use  . Smoking status: Never Smoker  . Smokeless tobacco:  Never Used  Substance and Sexual Activity  . Alcohol use: No    Alcohol/week: 0.0 standard drinks  . Drug use: No  . Sexual activity: Never  Lifestyle  . Physical activity    Days per week: Not on file    Minutes per session: Not on file  . Stress: Not at all  Relationships  . Social Herbalist on phone: Not on file    Gets together: Not on file    Attends religious service: Not on file    Active member of club or organization: Not on file    Attends meetings of clubs or organizations: Not on file    Relationship status: Not on file  . Intimate partner violence    Fear of current or ex partner: Not on file    Emotionally abused: Not on file    Physically abused: Not on file    Forced sexual activity: Not on file  Other Topics Concern  . Not on file  Social History Narrative   Retired    Lives by herself    Pets: None   Caffeine- Coffee 2 cups daily, no tea/soda       Family History  Problem Relation Age of Onset  . Alcohol abuse Mother        deceased 41  . Diabetes Father   . Breast cancer Maternal Aunt  dx 59s; deceased 58s  . Breast cancer Maternal Aunt        dx 61s; deceased 38s     Current Outpatient Medications:  .  amLODipine (NORVASC) 5 MG tablet, TAKE 1 TABLET BY MOUTH  DAILY, Disp: 90 tablet, Rfl: 1 .  atorvastatin (LIPITOR) 80 MG tablet, TAKE 1 TABLET BY MOUTH  DAILY, Disp: 90 tablet, Rfl: 1 .  CALCIUM-VITAMIN D PO, Take 1 tablet by mouth daily. , Disp: , Rfl:  .  CRANBERRY PO, Take 1 tablet by mouth 2 (two) times daily. , Disp: , Rfl:  .  Multiple Vitamins tablet, Take 1 tablet by mouth daily. , Disp: , Rfl:  .  oxybutynin (DITROPAN) 5 MG tablet, TAKE 1 TABLET BY MOUTH TWO  TIMES DAILY, Disp: 180 tablet, Rfl: 1 .  pantoprazole (PROTONIX) 40 MG tablet, TAKE 1 TABLET BY MOUTH  DAILY, Disp: 90 tablet, Rfl: 1 .  potassium chloride (K-DUR) 10 MEQ tablet, TAKE 1 TABLET BY MOUTH  DAILY, Disp: 90 tablet, Rfl: 1 .  torsemide (DEMADEX) 5 MG  tablet, TAKE 1 TABLET BY MOUTH  DAILY, Disp: 90 tablet, Rfl: 1 .  Turmeric 500 MG CAPS, Take 1,000 mg by mouth 2 (two) times daily. , Disp: , Rfl:  .  anastrozole (ARIMIDEX) 1 MG tablet, Take 1 tablet (1 mg total) by mouth daily., Disp: 30 tablet, Rfl: 3 .  cefadroxil (DURICEF) 500 MG capsule, Take 1 capsule (500 mg total) by mouth 2 (two) times daily., Disp: 20 capsule, Rfl: 0 .  Omega-3 Fatty Acids (FISH OIL PO), Take 1 capsule by mouth daily. , Disp: , Rfl:  .  sulfamethoxazole-trimethoprim (BACTRIM DS) 800-160 MG tablet, Take 1 tablet by mouth 2 (two) times daily., Disp: 20 tablet, Rfl: 0  Current Facility-Administered Medications:  .  piperacillin-tazobactam (ZOSYN) IVPB 3.375 g, 3.375 g, Intravenous, Once, Byrnett, Forest Gleason, MD  Facility-Administered Medications Ordered in Other Visits:  .  heparin lock flush 100 unit/mL, 500 Units, Intravenous, Once, Sindy Guadeloupe, MD .  heparin lock flush 100 unit/mL, 500 Units, Intracatheter, Once PRN, Sindy Guadeloupe, MD .  PACLitaxel (TAXOL) 156 mg in sodium chloride 0.9 % 250 mL chemo infusion (</= 78m/m2), 80 mg/m2 (Treatment Plan Recorded), Intravenous, Once, RSindy Guadeloupe MD, Last Rate: 276 mL/hr at 12/23/18 1122, 156 mg at 12/23/18 1122 .  sodium chloride flush (NS) 0.9 % injection 10 mL, 10 mL, Intravenous, PRN, RSindy Guadeloupe MD, 10 mL at 12/23/18 0911  Physical exam:  Vitals:   12/23/18 0918  BP: 126/77  Pulse: 93  Temp: 98.3 F (36.8 C)  TempSrc: Tympanic  Weight: 184 lb (83.5 kg)  Height: '5\' 5"'  (1.651 m)   Physical Exam Constitutional:      General: She is not in acute distress. HENT:     Head: Normocephalic and atraumatic.  Eyes:     Pupils: Pupils are equal, round, and reactive to light.  Neck:     Musculoskeletal: Normal range of motion.  Cardiovascular:     Rate and Rhythm: Normal rate and regular rhythm.     Heart sounds: Normal heart sounds.  Pulmonary:     Effort: Pulmonary effort is normal.     Breath sounds:  Normal breath sounds.  Abdominal:     General: Bowel sounds are normal.     Palpations: Abdomen is soft.     Comments: Mild tenderness to palpation in the left lower quadrant  Skin:    General: Skin is warm  and dry.  Neurological:     Mental Status: She is alert and oriented to person, place, and time.      CMP Latest Ref Rng & Units 12/23/2018  Glucose 70 - 99 mg/dL 127(H)  BUN 8 - 23 mg/dL 7(L)  Creatinine 0.44 - 1.00 mg/dL 0.47  Sodium 135 - 145 mmol/L 139  Potassium 3.5 - 5.1 mmol/L 3.4(L)  Chloride 98 - 111 mmol/L 106  CO2 22 - 32 mmol/L 25  Calcium 8.9 - 10.3 mg/dL 9.1  Total Protein 6.5 - 8.1 g/dL 6.8  Total Bilirubin 0.3 - 1.2 mg/dL 0.6  Alkaline Phos 38 - 126 U/L 100  AST 15 - 41 U/L 19  ALT 0 - 44 U/L 20   CBC Latest Ref Rng & Units 12/23/2018  WBC 4.0 - 10.5 K/uL 5.6  Hemoglobin 12.0 - 15.0 g/dL 12.1  Hematocrit 36.0 - 46.0 % 37.3  Platelets 150 - 400 K/uL 353     Assessment and plan- Patient is a 78 y.o. female  with invasive mammary carcinoma of the right breast pathological prognostic stage III [id number] cNX ER PR positive HER-2/neu negative status post right mastectomy and axillary lymph node dissection.  She is here for on treatment assessment prior to cycle 4 of weekly Taxol chemotherapy  Counts okay to proceed with cycle 4 of weekly Taxol chemotherapy today.  Given the Labor Day long weekend she is unable to get treatment next week and I will therefore skip next week of chemotherapy.  She will directly proceed for cycle 5 of Taxol chemotherapy in 2 weeks and I will see her at that time with a CBC with differential, CMP  Left lower quadrant abdominal pain: Possible mild diverticulitis: I will proceed with Cipro and Flagyl for 14 days and get a CT abdomen pelvis with contrast in the meanwhile   Visit Diagnosis 1. Malignant neoplasm of upper-outer quadrant of right breast in female, estrogen receptor positive ([place])   2. Encounter for antineoplastic chemotherapy       Dr. Randa Evens, MD, MPH [facility] at [facility] [phone] 12/23/2018 12:02 PM

## 2018-12-23 NOTE — Telephone Encounter (Signed)
Patient called stating the medications she received from pharmacy were not was she was expecting and would like a return call to clarify. Please return her call to [phone]

## 2018-12-23 NOTE — Telephone Encounter (Signed)
Done. I called her

## 2018-12-24 ENCOUNTER — Ambulatory Visit
Admission: RE | Admit: 2018-12-24 | Discharge: 2018-12-24 | Disposition: A | Discharge status: Home / Self Care | Payer: Medicare Other | Source: Ambulatory Visit | Attending: Oncology | Admitting: Oncology

## 2018-12-24 ENCOUNTER — Other Ambulatory Visit: Payer: Self-pay | Admitting: *Deleted

## 2018-12-24 DIAGNOSIS — Z170 Estrogen receptor positive status [ER+]: Secondary | ICD-10-CM | POA: Insufficient documentation

## 2018-12-24 DIAGNOSIS — C50411 Malignant neoplasm of upper-outer quadrant of right female breast: Secondary | ICD-10-CM | POA: Insufficient documentation

## 2018-12-24 DIAGNOSIS — E876 Hypokalemia: Secondary | ICD-10-CM

## 2018-12-24 DIAGNOSIS — R197 Diarrhea, unspecified: Secondary | ICD-10-CM | POA: Diagnosis not present

## 2018-12-24 DIAGNOSIS — R109 Unspecified abdominal pain: Secondary | ICD-10-CM | POA: Diagnosis not present

## 2018-12-24 DIAGNOSIS — Z8719 Personal history of other diseases of the digestive system: Secondary | ICD-10-CM | POA: Diagnosis not present

## 2018-12-24 DIAGNOSIS — K5730 Diverticulosis of large intestine without perforation or abscess without bleeding: Secondary | ICD-10-CM | POA: Diagnosis not present

## 2018-12-24 MED ORDER — POTASSIUM CHLORIDE CRYS ER 10 MEQ PO TBCR: 20.0000 meq | EXTENDED_RELEASE_TABLET | Freq: Every day | ORAL | 0 refills | Status: DC

## 2018-12-24 MED ORDER — IOHEXOL 300 MG/ML  SOLN
100.0000 mL | Freq: Once | INTRAMUSCULAR | Status: AC | PRN
  Administered 2018-12-24: 100 mL via INTRAVENOUS

## 2018-12-24 NOTE — Progress Notes (Signed)
I called pt about her appts. She already has printed it off from my chart. She states that the potassium has not been sent to her pharmacy yet. I checked into it and it was sent to mail in RX . I called and cancelled it and then sent it in to local pharmacy. Pt aware that it is now sent to local pharmacy

## 2018-12-26 ENCOUNTER — Ambulatory Visit: Payer: Medicare Other | Admitting: Occupational Therapy

## 2019-01-01 ENCOUNTER — Other Ambulatory Visit: Payer: Medicare Other

## 2019-01-01 ENCOUNTER — Ambulatory Visit: Payer: Medicare Other

## 2019-01-03 NOTE — Progress Notes (Signed)
Patient completed Echeck in 

## 2019-01-06 ENCOUNTER — Inpatient Hospital Stay (HOSPITAL_BASED_OUTPATIENT_CLINIC_OR_DEPARTMENT_OTHER): Payer: Medicare Other | Admitting: Oncology

## 2019-01-06 ENCOUNTER — Other Ambulatory Visit: Payer: Medicare Other

## 2019-01-06 ENCOUNTER — Ambulatory Visit: Payer: Medicare Other

## 2019-01-06 ENCOUNTER — Inpatient Hospital Stay: Payer: Medicare Other

## 2019-01-06 ENCOUNTER — Inpatient Hospital Stay: Payer: Medicare Other | Attending: Oncology

## 2019-01-06 ENCOUNTER — Other Ambulatory Visit: Payer: Self-pay

## 2019-01-06 ENCOUNTER — Ambulatory Visit: Payer: Medicare Other | Admitting: Oncology

## 2019-01-06 VITALS — BP 131/71 | HR 94 | Temp 98.4°F | Resp 16 | Ht 65.0 in | Wt 184.5 lb

## 2019-01-06 DIAGNOSIS — C50411 Malignant neoplasm of upper-outer quadrant of right female breast: Secondary | ICD-10-CM

## 2019-01-06 DIAGNOSIS — Z5111 Encounter for antineoplastic chemotherapy: Secondary | ICD-10-CM | POA: Insufficient documentation

## 2019-01-06 DIAGNOSIS — T451X5A Adverse effect of antineoplastic and immunosuppressive drugs, initial encounter: Secondary | ICD-10-CM | POA: Insufficient documentation

## 2019-01-06 DIAGNOSIS — G620 Drug-induced polyneuropathy: Secondary | ICD-10-CM | POA: Diagnosis not present

## 2019-01-06 DIAGNOSIS — Z23 Encounter for immunization: Secondary | ICD-10-CM

## 2019-01-06 DIAGNOSIS — Z95828 Presence of other vascular implants and grafts: Secondary | ICD-10-CM

## 2019-01-06 DIAGNOSIS — E876 Hypokalemia: Secondary | ICD-10-CM | POA: Insufficient documentation

## 2019-01-06 DIAGNOSIS — Z170 Estrogen receptor positive status [ER+]: Secondary | ICD-10-CM

## 2019-01-06 DIAGNOSIS — Z79899 Other long term (current) drug therapy: Secondary | ICD-10-CM | POA: Insufficient documentation

## 2019-01-06 DIAGNOSIS — C50911 Malignant neoplasm of unspecified site of right female breast: Secondary | ICD-10-CM

## 2019-01-06 LAB — CBC WITH DIFFERENTIAL/PLATELET
Abs Immature Granulocytes: 0.03 10*3/uL (ref 0.00–0.07)
Basophils Absolute: 0.1 10*3/uL (ref 0.0–0.1)
Basophils Relative: 1 %
Eosinophils Absolute: 0.1 10*3/uL (ref 0.0–0.5)
Eosinophils Relative: 1 %
HCT: 38.7 % (ref 36.0–46.0)
Hemoglobin: 12.6 g/dL (ref 12.0–15.0)
Immature Granulocytes: 0 %
Lymphocytes Relative: 33 %
Lymphs Abs: 2.4 10*3/uL (ref 0.7–4.0)
MCH: 29 pg (ref 26.0–34.0)
MCHC: 32.6 g/dL (ref 30.0–36.0)
MCV: 89 fL (ref 80.0–100.0)
Monocytes Absolute: 0.8 10*3/uL (ref 0.1–1.0)
Monocytes Relative: 11 %
Neutro Abs: 4 10*3/uL (ref 1.7–7.7)
Neutrophils Relative %: 54 %
Platelets: 350 10*3/uL (ref 150–400)
RBC: 4.35 MIL/uL (ref 3.87–5.11)
RDW: 16.7 % — ABNORMAL HIGH (ref 11.5–15.5)
WBC: 7.4 10*3/uL (ref 4.0–10.5)
nRBC: 0 % (ref 0.0–0.2)

## 2019-01-06 LAB — COMPREHENSIVE METABOLIC PANEL
ALT: 17 U/L (ref 0–44)
AST: 21 U/L (ref 15–41)
Albumin: 3.8 g/dL (ref 3.5–5.0)
Alkaline Phosphatase: 97 U/L (ref 38–126)
Anion gap: 9 (ref 5–15)
BUN: 8 mg/dL (ref 8–23)
CO2: 26 mmol/L (ref 22–32)
Calcium: 8.5 mg/dL — ABNORMAL LOW (ref 8.9–10.3)
Chloride: 107 mmol/L (ref 98–111)
Creatinine, Ser: 0.44 mg/dL (ref 0.44–1.00)
GFR calc Af Amer: >60 mL/min (ref >60)
GFR calc non Af Amer: >60 mL/min (ref >60)
Glucose, Bld: 111 mg/dL — ABNORMAL HIGH (ref 70–99)
Potassium: 3.6 mmol/L (ref 3.5–5.1)
Sodium: 142 mmol/L (ref 135–145)
Total Bilirubin: 0.5 mg/dL (ref 0.3–1.2)
Total Protein: 6.7 g/dL (ref 6.5–8.1)

## 2019-01-06 MED ORDER — FAMOTIDINE IN NACL 20-0.9 MG/50ML-% IV SOLN
20.0000 mg | Freq: Once | INTRAVENOUS | Status: AC
  Administered 2019-01-06: 20 mg via INTRAVENOUS
  Filled 2019-01-06: qty 50

## 2019-01-06 MED ORDER — SODIUM CHLORIDE 0.9 % IV SOLN
20.0000 mg | Freq: Once | INTRAVENOUS | Status: AC
  Administered 2019-01-06: 20 mg via INTRAVENOUS
  Filled 2019-01-06: qty 2

## 2019-01-06 MED ORDER — SODIUM CHLORIDE 0.9 % IV SOLN
65.0000 mg/m2 | Freq: Once | INTRAVENOUS | Status: AC
  Administered 2019-01-06: 126 mg via INTRAVENOUS
  Filled 2019-01-06: qty 21

## 2019-01-06 MED ORDER — HEPARIN SOD (PORK) LOCK FLUSH 100 UNIT/ML IV SOLN
500.0000 [IU] | Freq: Once | INTRAVENOUS | Status: AC
  Administered 2019-01-06: 500 [IU] via INTRAVENOUS
  Filled 2019-01-06: qty 5

## 2019-01-06 MED ORDER — HEPARIN SOD (PORK) LOCK FLUSH 100 UNIT/ML IV SOLN
500.0000 [IU] | Freq: Once | INTRAVENOUS | Status: DC | PRN
  Filled 2019-01-06: qty 5

## 2019-01-06 MED ORDER — SODIUM CHLORIDE 0.9% FLUSH
10.0000 mL | Freq: Once | INTRAVENOUS | Status: AC
  Administered 2019-01-06: 10 mL via INTRAVENOUS
  Filled 2019-01-06: qty 10

## 2019-01-06 MED ORDER — INFLUENZA VAC SPLIT QUAD 0.5 ML IM SUSY
0.5000 mL | PREFILLED_SYRINGE | Freq: Once | INTRAMUSCULAR | Status: DC
  Filled 2019-01-06: qty 0.5

## 2019-01-06 MED ORDER — SODIUM CHLORIDE 0.9 % IV SOLN
Freq: Once | INTRAVENOUS | Status: AC
  Administered 2019-01-06: 12:00:00 via INTRAVENOUS
  Filled 2019-01-06: qty 250

## 2019-01-06 MED ORDER — INFLUENZA VAC A&B SA ADJ QUAD 0.5 ML IM PRSY
0.5000 mL | PREFILLED_SYRINGE | Freq: Once | INTRAMUSCULAR | Status: AC
  Administered 2019-01-06: 0.5 mL via INTRAMUSCULAR
  Filled 2019-01-06: qty 0.5

## 2019-01-06 MED ORDER — DIPHENHYDRAMINE HCL 50 MG/ML IJ SOLN
50.0000 mg | Freq: Once | INTRAMUSCULAR | Status: AC
  Administered 2019-01-06: 12:00:00 50 mg via INTRAVENOUS
  Filled 2019-01-06: qty 1

## 2019-01-06 NOTE — Progress Notes (Signed)
She states she always for 30 years have numbness and some pain on bilateral feet but she noticed that she has some semi numbness on left foot at ankle and going up side of leg small amount.

## 2019-01-07 NOTE — Progress Notes (Signed)
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 Patient underwent lumpectomy and sentinel lymph node biopsy in February 2020. Pathology showed invasive lobular carcinoma with positive medial margin which was reexcised with still positive. Tumor was 24 mm, grade 2 ER PR positive and HER-2/neu negative. One sentinel lymph node that was excised was positive for macro met of 8 mm. No extranodal extension was present. MammaPrint came back as high risk with an average 10-year risk of untreated disease is 29%. Patient then underwent a bilateral MRI which showed additional suspicious non-mass enhancement along the inferior margin of the biopsy cavity extending 2 cm highly suspicious for continuous disease and another highly suspicious linear non-mass enhancement at the base of the nipple concerning for multifocal multicentric disease. Patient underwent right mastectomy and axillary lymph node dissectionon 09/20/2018. There was no residual invasive mammary carcinoma within the breast. However 15 out of 20 lymph nodes were involved with metastatic carcinoma measuring up to 6 mm and remaining 5  lymph nodes with isolated tumor cells. Pathologic stage was [id number]  Despite having surgery in May 2020 which was a second surgery-patient has not been able to start adjuvant chemotherapy. Her mastectomy was complicated by flap necrosis requiring debridement antibiotics and she had to be taken back to the OR for the same. Given her age and postmastectomy complications and delayed wound healing-weekly Taxol x12 cycles was chosen as the regimen for her adjuvant chemotherapy which was started on 12/02/2018  Interval history-she is tolerating chemotherapy well.  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  However she feels it more recently with tingling numbness has extended up to her lower part of her legs.  Denies any significant tingling numbness in her hands.  Denies any nausea or vomiting  ECOG PS- 1 Pain scale- 0   Review of systems- Review of Systems  Constitutional: Negative for chills, fever, malaise/fatigue and weight loss.  HENT: Negative for congestion, ear discharge and nosebleeds.   Eyes: Negative for blurred vision.  Respiratory: Negative for cough, hemoptysis, sputum production, shortness of breath and wheezing.   Cardiovascular: Negative for chest pain, palpitations, orthopnea and claudication.  Gastrointestinal: Negative for abdominal pain, blood in stool, constipation, diarrhea, heartburn, melena, nausea and vomiting.  Genitourinary: Negative for dysuria, flank pain, frequency, hematuria and urgency.  Musculoskeletal: Negative for back pain, joint pain and myalgias.  Skin: Negative for rash.  Neurological: Positive for sensory change (Peripheral neuropathy). Negative for dizziness, tingling, focal weakness, seizures, weakness and headaches.  Endo/Heme/Allergies: Does not bruise/bleed easily.  Psychiatric/Behavioral: Negative for depression and suicidal ideas. The patient does not have insomnia.       No  Known Allergies   Past  Medical History:  Diagnosis Date   Arthritis    Breast cancer ([place])    Diverticulitis    Family history of breast cancer    GERD (gastroesophageal reflux disease)    Hyperlipidemia    Hypertension      Past Surgical History:  Procedure Laterality Date   BREAST BIOPSY Right 05/28/2018   [place] bx, INVASIVE MAMMARY CARCINOMA WITH LOBULAR FEATURES   BREAST LUMPECTOMY Right 06/12/2018   [place], lobular features   BREAST LUMPECTOMY WITH SENTINEL LYMPH NODE BIOPSY Right 06/12/2018   Procedure: RIGHT BREAST WIDE EXCISION WITH SENTINEL LYMPH NODE BX;  Surgeon: Robert Bellow, MD;  Location: [facility] ORS;  Service: General;  Laterality: Right;   [place] AND DRAINAGE OF WOUND Right 10/30/2018   Procedure: IRRIGATION AND DEBRIDEMENT RIGHT CHEST WALL WOUND;  Surgeon: Robert Bellow, MD;  Location: [facility] ORS;  Service: General;  Laterality: Right;   JOINT REPLACEMENT     MASTECTOMY WITH AXILLARY LYMPH NODE DISSECTION Right 09/20/2018   Procedure: MASTECTOMY WITH AXILLARY LYMPH NODE DISSECTION RIGHT;  Surgeon: Robert Bellow, MD;  Location: [facility] ORS;  Service: General;  Laterality: Right;   ovaraian cyst removal Right    Cyst only (NOT OVARY)   PORTACATH PLACEMENT Left 09/20/2018   Procedure: INSERTION PORT-A-CATH, LEFT;  Surgeon: Robert Bellow, MD;  Location: [facility] ORS;  Service: General;  Laterality: Left;   TOTAL KNEE ARTHROPLASTY Bilateral 05/05/2010    Social History   Socioeconomic History   Marital status: Single    Spouse name: Not on file   Number of children: Not on file   Years of education: Not on file   Highest education level: Not on file  Occupational History   Occupation: retired  Scientist, product/process development strain: Not hard at all   Food insecurity    Worry: Never true    Inability: Never true   Transportation needs    Medical: No    Non-medical: No  Tobacco  Use   Smoking status: Never Smoker   Smokeless tobacco: Never Used  Substance and Sexual Activity   Alcohol use: No    Alcohol/week: 0.0 standard drinks   Drug use: No   Sexual activity: Never  Lifestyle   Physical activity    Days per week: Not on file    Minutes per session: Not on file   Stress: Not at all  Relationships   Social connections    Talks on phone: Not on file    Gets together: Not on file    Attends religious service: Not on file    Active member of club or organization: Not on file    Attends meetings of clubs or organizations: Not on file    Relationship status: Not on file   Intimate partner violence    Fear of current or ex partner: Not on file    Emotionally abused: Not on file    Physically abused: Not on file    Forced sexual activity: Not on file  Other Topics Concern   Not on file  Social History Narrative   Retired    Lives by herself    Pets: None   Caffeine- Coffee 2 cups daily, no tea/soda       Family History  Problem Relation Age of Onset   Alcohol abuse Mother        deceased 67   Diabetes Father  Breast cancer Maternal Aunt        dx 32s; deceased 49s   Breast cancer Maternal Aunt        dx 52s; deceased 44s     Current Outpatient Medications:    amLODipine (NORVASC) 5 MG tablet, TAKE 1 TABLET BY MOUTH  DAILY, Disp: 90 tablet, Rfl: 1   anastrozole (ARIMIDEX) 1 MG tablet, Take 1 tablet (1 mg total) by mouth daily., Disp: 30 tablet, Rfl: 3   atorvastatin (LIPITOR) 80 MG tablet, TAKE 1 TABLET BY MOUTH  DAILY, Disp: 90 tablet, Rfl: 1   CALCIUM-VITAMIN D PO, Take 1 tablet by mouth daily. , Disp: , Rfl:    cefadroxil (DURICEF) 500 MG capsule, Take 1 capsule (500 mg total) by mouth 2 (two) times daily., Disp: 20 capsule, Rfl: 0   ciprofloxacin (CIPRO) 500 MG tablet, Take 1 tablet (500 mg total) by mouth 2 (two) times daily., Disp: 20 tablet, Rfl: 0   CRANBERRY PO, Take 1 tablet by mouth 2 (two) times daily. ,  Disp: , Rfl:    metroNIDAZOLE (FLAGYL) 500 MG tablet, Take 1 tablet (500 mg total) by mouth 3 (three) times daily., Disp: 30 tablet, Rfl: 0   Multiple Vitamins tablet, Take 1 tablet by mouth daily. , Disp: , Rfl:    Omega-3 Fatty Acids (FISH OIL PO), Take 1 capsule by mouth daily. , Disp: , Rfl:    oxybutynin (DITROPAN) 5 MG tablet, TAKE 1 TABLET BY MOUTH TWO  TIMES DAILY, Disp: 180 tablet, Rfl: 1   pantoprazole (PROTONIX) 40 MG tablet, TAKE 1 TABLET BY MOUTH  DAILY, Disp: 90 tablet, Rfl: 1   potassium chloride (K-DUR) 10 MEQ tablet, Take 2 tablets (20 mEq total) by mouth daily for 14 days., Disp: 28 tablet, Rfl: 0   sulfamethoxazole-trimethoprim (BACTRIM DS) 800-160 MG tablet, Take 1 tablet by mouth 2 (two) times daily., Disp: 20 tablet, Rfl: 0   torsemide (DEMADEX) 5 MG tablet, TAKE 1 TABLET BY MOUTH  DAILY, Disp: 90 tablet, Rfl: 1   Turmeric 500 MG CAPS, Take 1,000 mg by mouth 2 (two) times daily. , Disp: , Rfl:   Current Facility-Administered Medications:    piperacillin-tazobactam (ZOSYN) IVPB 3.375 g, 3.375 g, Intravenous, Once, Byrnett, Forest Gleason, MD  Physical exam:  Vitals:   01/06/19 1113  BP: 131/71  Pulse: 94  Resp: 16  Temp: 98.4 F (36.9 C)  TempSrc: Tympanic  Weight: 184 lb 8 oz (83.7 kg)  Height: '5\' 5"'  (1.651 m)   Physical Exam Constitutional:      Comments: Elderly woman who ambulates with a cane.  Appears in no acute distress  HENT:     Head: Normocephalic and atraumatic.  Eyes:     Pupils: Pupils are equal, round, and reactive to light.  Neck:     Musculoskeletal: Normal range of motion.  Cardiovascular:     Rate and Rhythm: Normal rate and regular rhythm.     Heart sounds: Normal heart sounds.  Pulmonary:     Effort: Pulmonary effort is normal.     Breath sounds: Normal breath sounds.  Abdominal:     General: Bowel sounds are normal.     Palpations: Abdomen is soft.  Skin:    General: Skin is warm and dry.  Neurological:     Mental Status:  She is alert and oriented to person, place, and time.      CMP Latest Ref Rng & Units 01/06/2019  Glucose 70 - 99 mg/dL 111(H)  BUN 8 - 23 mg/dL 8  Creatinine 0.44 - 1.00 mg/dL 0.44  Sodium 135 - 145 mmol/L 142  Potassium 3.5 - 5.1 mmol/L 3.6  Chloride 98 - 111 mmol/L 107  CO2 22 - 32 mmol/L 26  Calcium 8.9 - 10.3 mg/dL 8.5(L)  Total Protein 6.5 - 8.1 g/dL 6.7  Total Bilirubin 0.3 - 1.2 mg/dL 0.5  Alkaline Phos 38 - 126 U/L 97  AST 15 - 41 U/L 21  ALT 0 - 44 U/L 17   CBC Latest Ref Rng & Units 01/06/2019  WBC 4.0 - 10.5 K/uL 7.4  Hemoglobin 12.0 - 15.0 g/dL 12.6  Hematocrit 36.0 - 46.0 % 38.7  Platelets 150 - 400 K/uL 350    No images are attached to the encounter.  Ct Abdomen Pelvis W Contrast  Result Date: 12/24/2018 CLINICAL DATA:  Left lower quadrant pain, nausea, and diarrhea for 1 week. Diverticulosis. Currently undergoing chemotherapy for breast carcinoma. EXAM: CT ABDOMEN AND PELVIS WITH CONTRAST TECHNIQUE: Multidetector CT imaging of the abdomen and pelvis was performed using the standard protocol following bolus administration of intravenous contrast. CONTRAST:  146m OMNIPAQUE IOHEXOL 300 MG/ML  SOLN COMPARISON:  10/11/2018 FINDINGS: Lower Chest: No acute findings. Hepatobiliary: A few tiny sub-cm low-attenuation lesions in right and left lobes remain stable, most likely representing cysts. No definite hepatic masses identified. Prior cholecystectomy. No evidence of biliary obstruction. Pancreas:  No mass or inflammatory changes. Spleen: Within normal limits in size and appearance. Adrenals/Urinary Tract: No masses identified. No evidence of hydronephrosis. Stomach/Bowel: No evidence of obstruction, inflammatory process or abnormal fluid collections. Mild diverticulosis is seen involving the sigmoid colon, however there is no evidence of diverticulitis. Vascular/Lymphatic: No pathologically enlarged lymph nodes. No abdominal aortic aneurysm. Aortic atherosclerosis. Reproductive:   No mass or other significant abnormality. Other:  None. Musculoskeletal:  No suspicious bone lesions identified. IMPRESSION: Mild sigmoid diverticulosis. No evidence of diverticulitis or other acute findings. No evidence of abdominal or pelvic metastatic disease. Aortic Atherosclerosis (ICD10-I70.0). Electronically Signed   By: JMarlaine HindM.D.   On: 12/24/2018 15:04     Assessment and plan- Patient is a 78y.o. female with invasive mammary carcinoma of the right breast pathological prognostic stage III [id number] cNX ER PR positive HER-2/neu negative status post right mastectomy and axillary lymph node dissection.   She is here for on treatment assessment prior to cycle 5 of weekly Taxol chemotherapy  Counts are ok to proceed with cycle 5 of weekly Taxol chemotherapy today.  I will dose reduce her Taxol to 65 mg per metered squared given her ongoing symptoms of peripheral neuropathy which is still mild and grade 1 at this time. Patient will directly proceed with cycle 6 next week and I will see her back in 2 weeks for cycle 7  Chemo-induced peripheral neuropathy: Taxol dose reduced as above.  Continue to monitor  Patient will receive a flu shot today    Visit Diagnosis 1. Need for prophylactic vaccination and inoculation against influenza   2. Malignant neoplasm of upper-outer quadrant of right breast in [place], [place] positive [place]   3. Encounter for antineoplastic chemotherapy      Dr. ARanda Evens MD, MPH [facility]at A[id number]/15/2020 9:03 AM

## 2019-01-10 ENCOUNTER — Other Ambulatory Visit: Payer: Self-pay

## 2019-01-13 ENCOUNTER — Inpatient Hospital Stay: Payer: Medicare Other

## 2019-01-13 ENCOUNTER — Ambulatory Visit: Payer: Medicare Other | Admitting: Oncology

## 2019-01-13 ENCOUNTER — Other Ambulatory Visit: Payer: Self-pay

## 2019-01-13 VITALS — BP 121/69 | HR 83 | Temp 98.8°F | Resp 18 | Wt 183.0 lb

## 2019-01-13 DIAGNOSIS — Z79899 Other long term (current) drug therapy: Secondary | ICD-10-CM | POA: Diagnosis not present

## 2019-01-13 DIAGNOSIS — C50911 Malignant neoplasm of unspecified site of right female breast: Secondary | ICD-10-CM

## 2019-01-13 DIAGNOSIS — E876 Hypokalemia: Secondary | ICD-10-CM | POA: Diagnosis not present

## 2019-01-13 DIAGNOSIS — Z95828 Presence of other vascular implants and grafts: Secondary | ICD-10-CM

## 2019-01-13 DIAGNOSIS — Z23 Encounter for immunization: Secondary | ICD-10-CM | POA: Diagnosis not present

## 2019-01-13 DIAGNOSIS — T451X5A Adverse effect of antineoplastic and immunosuppressive drugs, initial encounter: Secondary | ICD-10-CM | POA: Diagnosis not present

## 2019-01-13 DIAGNOSIS — Z170 Estrogen receptor positive status [ER+]: Secondary | ICD-10-CM

## 2019-01-13 DIAGNOSIS — Z5111 Encounter for antineoplastic chemotherapy: Secondary | ICD-10-CM | POA: Diagnosis not present

## 2019-01-13 DIAGNOSIS — C50411 Malignant neoplasm of upper-outer quadrant of right female breast: Secondary | ICD-10-CM | POA: Diagnosis not present

## 2019-01-13 DIAGNOSIS — G620 Drug-induced polyneuropathy: Secondary | ICD-10-CM | POA: Diagnosis not present

## 2019-01-13 LAB — CBC WITH DIFFERENTIAL/PLATELET
Abs Immature Granulocytes: 0.09 10*3/uL — ABNORMAL HIGH (ref 0.00–0.07)
Basophils Absolute: 0.1 10*3/uL (ref 0.0–0.1)
Basophils Relative: 1 %
Eosinophils Absolute: 0.1 10*3/uL (ref 0.0–0.5)
Eosinophils Relative: 2 %
HCT: 38.4 % (ref 36.0–46.0)
Hemoglobin: 12.4 g/dL (ref 12.0–15.0)
Immature Granulocytes: 1 %
Lymphocytes Relative: 29 %
Lymphs Abs: 2.4 10*3/uL (ref 0.7–4.0)
MCH: 28.7 pg (ref 26.0–34.0)
MCHC: 32.3 g/dL (ref 30.0–36.0)
MCV: 88.9 fL (ref 80.0–100.0)
Monocytes Absolute: 0.4 10*3/uL (ref 0.1–1.0)
Monocytes Relative: 5 %
Neutro Abs: 5.1 10*3/uL (ref 1.7–7.7)
Neutrophils Relative %: 62 %
Platelets: 340 10*3/uL (ref 150–400)
RBC: 4.32 MIL/uL (ref 3.87–5.11)
RDW: 16.5 % — ABNORMAL HIGH (ref 11.5–15.5)
WBC: 8.2 10*3/uL (ref 4.0–10.5)
nRBC: 0 % (ref 0.0–0.2)

## 2019-01-13 LAB — COMPREHENSIVE METABOLIC PANEL
ALT: 22 U/L (ref 0–44)
AST: 21 U/L (ref 15–41)
Albumin: 3.5 g/dL (ref 3.5–5.0)
Alkaline Phosphatase: 97 U/L (ref 38–126)
Anion gap: 8 (ref 5–15)
BUN: 9 mg/dL (ref 8–23)
CO2: 27 mmol/L (ref 22–32)
Calcium: 8.9 mg/dL (ref 8.9–10.3)
Chloride: 104 mmol/L (ref 98–111)
Creatinine, Ser: 0.59 mg/dL (ref 0.44–1.00)
GFR calc Af Amer: >60 mL/min (ref >60)
GFR calc non Af Amer: >60 mL/min (ref >60)
Glucose, Bld: 125 mg/dL — ABNORMAL HIGH (ref 70–99)
Potassium: 3.7 mmol/L (ref 3.5–5.1)
Sodium: 139 mmol/L (ref 135–145)
Total Bilirubin: 0.4 mg/dL (ref 0.3–1.2)
Total Protein: 6.7 g/dL (ref 6.5–8.1)

## 2019-01-13 MED ORDER — SODIUM CHLORIDE 0.9% FLUSH
10.0000 mL | INTRAVENOUS | Status: DC | PRN
  Administered 2019-01-13: 10 mL via INTRAVENOUS
  Filled 2019-01-13: qty 10

## 2019-01-13 MED ORDER — SODIUM CHLORIDE 0.9 % IV SOLN
Freq: Once | INTRAVENOUS | Status: AC
  Administered 2019-01-13: 10:00:00 via INTRAVENOUS
  Filled 2019-01-13: qty 250

## 2019-01-13 MED ORDER — SODIUM CHLORIDE 0.9 % IV SOLN
65.0000 mg/m2 | Freq: Once | INTRAVENOUS | Status: AC
  Administered 2019-01-13: 126 mg via INTRAVENOUS
  Filled 2019-01-13: qty 21

## 2019-01-13 MED ORDER — DIPHENHYDRAMINE HCL 50 MG/ML IJ SOLN
50.0000 mg | Freq: Once | INTRAMUSCULAR | Status: AC
  Administered 2019-01-13: 50 mg via INTRAVENOUS
  Filled 2019-01-13: qty 1

## 2019-01-13 MED ORDER — HEPARIN SOD (PORK) LOCK FLUSH 100 UNIT/ML IV SOLN
500.0000 [IU] | Freq: Once | INTRAVENOUS | Status: AC
  Administered 2019-01-13: 13:00:00 500 [IU] via INTRAVENOUS
  Filled 2019-01-13: qty 5

## 2019-01-13 MED ORDER — SODIUM CHLORIDE 0.9 % IV SOLN
20.0000 mg | Freq: Once | INTRAVENOUS | Status: AC
  Administered 2019-01-13: 20 mg via INTRAVENOUS
  Filled 2019-01-13: qty 2

## 2019-01-13 MED ORDER — FAMOTIDINE IN NACL 20-0.9 MG/50ML-% IV SOLN
20.0000 mg | Freq: Once | INTRAVENOUS | Status: AC
  Administered 2019-01-13: 20 mg via INTRAVENOUS
  Filled 2019-01-13: qty 50

## 2019-01-13 MED ORDER — SODIUM CHLORIDE 0.9% FLUSH
10.0000 mL | Freq: Once | INTRAVENOUS | Status: AC
  Administered 2019-01-13: 09:00:00 10 mL via INTRAVENOUS
  Filled 2019-01-13: qty 10

## 2019-01-14 ENCOUNTER — Ambulatory Visit: Payer: Medicare Other | Attending: Oncology | Admitting: Occupational Therapy

## 2019-01-14 DIAGNOSIS — L905 Scar conditions and fibrosis of skin: Secondary | ICD-10-CM

## 2019-01-14 DIAGNOSIS — I972 Postmastectomy lymphedema syndrome: Secondary | ICD-10-CM | POA: Diagnosis not present

## 2019-01-14 NOTE — Therapy (Signed)
[place] [facility] [address] [place], [address] Phone: [phone]   Fax:  [phone]  Occupational Therapy Treatment  Patient Details  Name: Theresa Reid MRN: [id number] Date of Birth: Jun 18, 1940 Referring Provider (OT): Avon Gully   Encounter Date: 01/14/2019  OT End of Session - 01/14/19 1551    Visit Number  6    Number of Visits  9    Date for OT Re-Evaluation  03/11/19    OT Start Time  1521    OT Stop Time  1551    OT Time Calculation (min)  30 min    Activity Tolerance  Patient tolerated treatment well    Behavior During Therapy  [facility] for tasks assessed/performed       Past Medical History:  Diagnosis Date  . Arthritis   . Breast cancer ([place])   . Diverticulitis   . Family history of breast cancer   . GERD (gastroesophageal reflux disease)   . Hyperlipidemia   . Hypertension     Past Surgical History:  Procedure Laterality Date  . BREAST BIOPSY Right 05/28/2018   [place] b[place], INVASIVE MAMMARY CARCINOMA WITH LOBULAR FEATURES  . BREAST LUMPECTOMY Right 06/12/2018   [facility], lobular features  . BREAST LUMPECTOMY WITH SENTINEL LYMPH NODE BIOPSY Right 06/12/2018   Procedure: RIGHT BREAST WIDE EXCISION WITH SENTINEL LYMPH NODE BX;  Surgeon: Robert Bellow, MD;  Location: [facility] ORS;  Service: General;  Laterality: Right;  . CHOLECYSTECTOMY    . [place]  . INCISION AND DRAINAGE OF WOUND Right 10/30/2018   Procedure: IRRIGATION AND DEBRIDEMENT RIGHT CHEST WALL WOUND;  Surgeon: Robert Bellow, MD;  Location: [facility] ORS;  Service: General;  Laterality: Right;  . JOINT REPLACEMENT    . MASTECTOMY WITH AXILLARY LYMPH NODE DISSECTION Right 09/20/2018   Procedure: MASTECTOMY WITH AXILLARY LYMPH NODE DISSECTION RIGHT;  Surgeon: Robert Bellow, MD;  Location: [facility] ORS;  Service: General;  Laterality: Right;  . ovaraian cyst removal Right    Cyst only (NOT OVARY)  . PORTACATH PLACEMENT  Left 09/20/2018   Procedure: INSERTION PORT-A-CATH, LEFT;  Surgeon: Robert Bellow, MD;  Location: [facility] ORS;  Service: General;  Laterality: Left;  . TOTAL KNEE ARTHROPLASTY Bilateral 05/05/2010    There were no vitals filed for this visit.  Subjective Assessment - 01/14/19 1548    Subjective   I am half way with my chemo - doing well - my hair fell out and had little neuropathy in my feet - but otherwise doing great - I got my new glove and it feel much better - as well as the jovipak breast pad- I wear it during day if I feel like my chest area is little swollen or the upper arm in the am    Pertinent History  Patient had her original lumpectomy and sentinel lymph node biopsy back in February 2020 R breast   She then had an MRI which showed more areas of enhancement and was taken back to the OR for mastectomy and axillary lymph node dissection on 09/20/2018 which showed 15 of the lymph nodes were also positive for malignancy and this upstaged her to stage IIIa disease.  Plan was to offer her adjuvant chemotherapy following this.  However her mastectomy course was complicated by flap necrosis requiring debridement antibiotics and she had to be also taken to the OR on 10/30/2018.  Her wound has not completely healed yet and she  still has a drain in place.  She seen  Dr. Dahlia Byes on 11/18/2018.  There is concern for early cellulitis of her chest wall.  Dr. Dahlia Byes after his visit started  patient on Augmentin for 2 more weeks which further delays her chemotherapy. Lymphedema in R UE per pt developed after surgery on 10/30/2018 - stayed the same - not going down - denies pain    Patient Stated Goals  I want to get sleeve so my arm don't  get big and to  prevent infection    Currently in Pain?  No/denies          LYMPHEDEMA/ONCOLOGY QUESTIONNAIRE - 01/14/19 1530      Right Upper Extremity Lymphedema   15 cm Proximal to Olecranon Process  38 cm    10 cm Proximal to Olecranon Process  38 cm    Olecranon  Process  25.5 cm    15 cm Proximal to Ulnar Styloid Process  22 cm    10 cm Proximal to Ulnar Styloid Process  19 cm    Just Proximal to Ulnar Styloid Process  14.4 cm    Across Hand at [employer]  17.5 cm    At [place] of 2nd Digit  5.6 cm    At [facility] of Thumb  5.6 cm         Circumference of R UE doing very well - see flow sheet - decrease some compare to last time   pt arrive with her Jobst Elvarex sleeve and  New 15-83mmHg glove  Glove fitting well -and not causing any numbness  Sleeve doing well - pt has slippy gator and able to donn better  Pt only wore daytime sleeve and glove during day - wear tubigrip once the last 3 wks   at this time not needing night time compression garment   Assess scars on chest on L - scar tissue adhesions improved greatly and fibrosis - pt can stop manual therapy -and cont to wear jovipak unilateral breast pad - when chest show increase swelling or upper arm in the am  Pt ed on wearing correctly                   OT Education - 01/14/19 1550    Education Details  wearing of compression garments    Person(s) Educated  Patient    Methods  Explanation;Demonstration;Tactile cues;Verbal cues;Handout    Comprehension  Returned demonstration;Verbalized understanding       OT Short Term Goals - 01/14/19 1557      OT SHORT TERM GOAL #1   Title  Pt's R UE circumference decrease by at least 1cm -2 cm from hand to upper arm - to be fitted for appropriate compression garments    Status  Achieved        OT Long Term Goals - 01/14/19 1557      OT LONG TERM GOAL #1   Title  Pt to be fitted wth correct compression for R UE and if indiicated chest to decrease lymphedema and prevent cellulitis    Status  Achieved      OT LONG TERM GOAL #2   Title  Pt maintain her L UE lymphedema  circumference with correct compression garments thru out her CA treatment    Baseline  pt half way with chemo - reassess end of chemo - ? if need radiation     Time  12    Period  Weeks    Status  New    [place] Date  03/11/19            Plan - 01/14/19 1554    Clinical Impression Statement  Pt arrive with her Jobst Elvarex soft sleeve and new glove on ( glove 15-19mmHg) - pt report glove better and not causing any numbness - circumference of L UE decrease and she also starting wearing her Jovipak unilateral post mastectomy breast pad - scar tiissue adhesions improve and fibrosis - pt can stop manual therapy on chest - and cont with Self MLD and compression garments - pt to follow up with me in about 6 wks after chemo done for check on her L UE circumference    OT Occupational Profile and History  Problem Focused Assessment - Including review of records relating to presenting problem    Occupational performance deficits (Please refer to evaluation for details):  ADL's;Play;Leisure;IADL's    Body Structure / Function / Physical Skills  ADL;UE functional use;Scar mobility;Skin integrity;Edema    Rehab Potential  Good    Clinical Decision Making  Several treatment options, min-mod task modification necessary    Comorbidities Affecting Occupational Performance:  May have comorbidities impacting occupational performance    Modification or Assistance to Complete Evaluation   No modification of tasks or assist necessary to complete eval    OT Frequency  Monthly    OT Duration  12 weeks    OT Treatment/Interventions  Self-care/ADL training;Manual lymph drainage;Therapeutic exercise;Patient/family education;Scar mobilization;Manual Therapy    Plan  check on her circumference in L UE after chemo done -and chest scar and fibrosis    OT Home Exercise Plan  see pt instruction    Consulted and Agree with Plan of Care  Patient       Patient will benefit from skilled therapeutic intervention in order to improve the following deficits and impairments:   Body Structure / Function / Physical Skills: ADL, UE functional use, Scar mobility, Skin integrity,  Edema       Visit Diagnosis: Scar condition and fibrosis of skin - Plan: Ot plan of care cert/re-cert  Postmastectomy lymphedema syndrome - Plan: Ot plan of care cert/re-cert    Problem List Patient Active Problem List   Diagnosis Date Noted  . Infected skin flap 10/09/2018  . Cataract 09/24/2018  . Urge incontinence of urine 09/24/2018  . Malignant neoplasm of right female breast ([place]) 06/07/2018  . Goals of care, counseling/discussion 06/07/2018  . Family history of breast cancer   . Dairy product intolerance 01/14/2018  . Seborrheic keratosis 04/10/2017  . Dysuria 02/19/2017  . Visual disturbance 10/03/2016  . GERD (gastroesophageal reflux disease) 09/14/2016  . HLD (hyperlipidemia) 08/13/2015  . Elevated alkaline phosphatase level 02/14/2015  . Hypertension 11/13/2014  . 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    Rosalyn Gess OTR/L,CLT 01/14/2019, 4:01 PM  [place] [facility] [address] [place], [address] Phone: [phone]   Fax:  [phone]  Name: RHAELYNN BRUCH MRN: [id number] Date of Birth: 1940/11/11

## 2019-01-14 NOTE — Patient Instructions (Signed)
Cont with her daytime compression sleeve and glove  Jovipak unilateral breastpad - wear when she feels chest are little swollen or upper arm in the am Contact me again for follow up in about 6 wks when chemo is done

## 2019-01-17 ENCOUNTER — Other Ambulatory Visit: Payer: Self-pay

## 2019-01-17 ENCOUNTER — Encounter: Payer: Self-pay | Admitting: Oncology

## 2019-01-17 NOTE — Progress Notes (Signed)
Patient pre screened for office appointment, no questions or concerns today. 

## 2019-01-20 ENCOUNTER — Inpatient Hospital Stay: Payer: Medicare Other

## 2019-01-20 ENCOUNTER — Inpatient Hospital Stay (HOSPITAL_BASED_OUTPATIENT_CLINIC_OR_DEPARTMENT_OTHER): Payer: Medicare Other | Admitting: Oncology

## 2019-01-20 ENCOUNTER — Inpatient Hospital Stay: Payer: Medicare Other | Admitting: *Deleted

## 2019-01-20 ENCOUNTER — Other Ambulatory Visit: Payer: Self-pay

## 2019-01-20 VITALS — BP 127/70 | HR 97 | Temp 97.8°F | Resp 16 | Ht 65.0 in | Wt 180.2 lb

## 2019-01-20 DIAGNOSIS — Z5111 Encounter for antineoplastic chemotherapy: Secondary | ICD-10-CM

## 2019-01-20 DIAGNOSIS — Z23 Encounter for immunization: Secondary | ICD-10-CM | POA: Diagnosis not present

## 2019-01-20 DIAGNOSIS — C50411 Malignant neoplasm of upper-outer quadrant of right female breast: Secondary | ICD-10-CM

## 2019-01-20 DIAGNOSIS — Z95828 Presence of other vascular implants and grafts: Secondary | ICD-10-CM

## 2019-01-20 DIAGNOSIS — E876 Hypokalemia: Secondary | ICD-10-CM

## 2019-01-20 DIAGNOSIS — G620 Drug-induced polyneuropathy: Secondary | ICD-10-CM

## 2019-01-20 DIAGNOSIS — Z170 Estrogen receptor positive status [ER+]: Secondary | ICD-10-CM

## 2019-01-20 DIAGNOSIS — T451X5A Adverse effect of antineoplastic and immunosuppressive drugs, initial encounter: Secondary | ICD-10-CM

## 2019-01-20 DIAGNOSIS — Z79899 Other long term (current) drug therapy: Secondary | ICD-10-CM | POA: Diagnosis not present

## 2019-01-20 DIAGNOSIS — C50911 Malignant neoplasm of unspecified site of right female breast: Secondary | ICD-10-CM

## 2019-01-20 LAB — CBC WITH DIFFERENTIAL/PLATELET
Abs Immature Granulocytes: 0.05 10*3/uL (ref 0.00–0.07)
Basophils Absolute: 0 10*3/uL (ref 0.0–0.1)
Basophils Relative: 1 %
Eosinophils Absolute: 0.1 10*3/uL (ref 0.0–0.5)
Eosinophils Relative: 2 %
HCT: 37.2 % (ref 36.0–46.0)
Hemoglobin: 12.4 g/dL (ref 12.0–15.0)
Immature Granulocytes: 1 %
Lymphocytes Relative: 40 %
Lymphs Abs: 2.4 10*3/uL (ref 0.7–4.0)
MCH: 29.2 pg (ref 26.0–34.0)
MCHC: 33.3 g/dL (ref 30.0–36.0)
MCV: 87.7 fL (ref 80.0–100.0)
Monocytes Absolute: 0.5 10*3/uL (ref 0.1–1.0)
Monocytes Relative: 9 %
Neutro Abs: 3 10*3/uL (ref 1.7–7.7)
Neutrophils Relative %: 47 %
Platelets: 265 10*3/uL (ref 150–400)
RBC: 4.24 MIL/uL (ref 3.87–5.11)
RDW: 16.3 % — ABNORMAL HIGH (ref 11.5–15.5)
WBC: 6.1 10*3/uL (ref 4.0–10.5)
nRBC: 0 % (ref 0.0–0.2)

## 2019-01-20 LAB — COMPREHENSIVE METABOLIC PANEL
ALT: 19 U/L (ref 0–44)
AST: 20 U/L (ref 15–41)
Albumin: 3.7 g/dL (ref 3.5–5.0)
Alkaline Phosphatase: 99 U/L (ref 38–126)
Anion gap: 10 (ref 5–15)
BUN: 12 mg/dL (ref 8–23)
CO2: 25 mmol/L (ref 22–32)
Calcium: 9.1 mg/dL (ref 8.9–10.3)
Chloride: 104 mmol/L (ref 98–111)
Creatinine, Ser: 0.51 mg/dL (ref 0.44–1.00)
GFR calc Af Amer: >60 mL/min (ref >60)
GFR calc non Af Amer: >60 mL/min (ref >60)
Glucose, Bld: 118 mg/dL — ABNORMAL HIGH (ref 70–99)
Potassium: 3.3 mmol/L — ABNORMAL LOW (ref 3.5–5.1)
Sodium: 139 mmol/L (ref 135–145)
Total Bilirubin: 0.5 mg/dL (ref 0.3–1.2)
Total Protein: 6.9 g/dL (ref 6.5–8.1)

## 2019-01-20 MED ORDER — SODIUM CHLORIDE 0.9 % IV SOLN
20.0000 mg | Freq: Once | INTRAVENOUS | Status: AC
  Administered 2019-01-20: 20 mg via INTRAVENOUS
  Filled 2019-01-20: qty 2

## 2019-01-20 MED ORDER — SODIUM CHLORIDE 0.9% FLUSH
10.0000 mL | Freq: Once | INTRAVENOUS | Status: AC
  Administered 2019-01-20: 11:00:00 10 mL via INTRAVENOUS
  Filled 2019-01-20: qty 10

## 2019-01-20 MED ORDER — SODIUM CHLORIDE 0.9 % IV SOLN
65.0000 mg/m2 | Freq: Once | INTRAVENOUS | Status: AC
  Administered 2019-01-20: 126 mg via INTRAVENOUS
  Filled 2019-01-20: qty 21

## 2019-01-20 MED ORDER — HEPARIN SOD (PORK) LOCK FLUSH 100 UNIT/ML IV SOLN
500.0000 [IU] | Freq: Once | INTRAVENOUS | Status: AC | PRN
  Administered 2019-01-20: 500 [IU]
  Filled 2019-01-20: qty 5

## 2019-01-20 MED ORDER — POTASSIUM CHLORIDE IN NACL 20-0.9 MEQ/L-% IV SOLN
Freq: Once | INTRAVENOUS | Status: DC
  Filled 2019-01-20: qty 1000

## 2019-01-20 MED ORDER — DIPHENHYDRAMINE HCL 50 MG/ML IJ SOLN
50.0000 mg | Freq: Once | INTRAMUSCULAR | Status: AC
  Administered 2019-01-20: 50 mg via INTRAVENOUS
  Filled 2019-01-20: qty 1

## 2019-01-20 MED ORDER — SODIUM CHLORIDE 0.9 % IV SOLN: Freq: Once | INTRAVENOUS | Status: DC

## 2019-01-20 MED ORDER — FAMOTIDINE IN NACL 20-0.9 MG/50ML-% IV SOLN
20.0000 mg | Freq: Once | INTRAVENOUS | Status: AC
  Administered 2019-01-20: 20 mg via INTRAVENOUS
  Filled 2019-01-20: qty 50

## 2019-01-20 MED ORDER — POTASSIUM CHLORIDE 20 MEQ/100ML IV SOLN
20.0000 meq | Freq: Once | INTRAVENOUS | Status: AC
  Administered 2019-01-20: 20 meq via INTRAVENOUS

## 2019-01-20 MED ORDER — SODIUM CHLORIDE 0.9 % IV SOLN
Freq: Once | INTRAVENOUS | Status: AC
  Administered 2019-01-20: 12:00:00 via INTRAVENOUS
  Filled 2019-01-20: qty 250

## 2019-01-20 NOTE — Progress Notes (Signed)
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 Patient underwent lumpectomy and sentinel lymph node biopsy in February 2020. Pathology showed invasive lobular carcinoma with positive medial margin which was reexcised with still positive. Tumor was 24 mm, grade 2 ER PR positive and HER-2/neu negative. One sentinel lymph node that was excised was positive for macro met of 8 mm. No extranodal extension was present. MammaPrint came back as high risk with an average 10-year risk of untreated disease is 29%. Patient then underwent a bilateral MRI which showed additional suspicious non-mass enhancement along the inferior margin of the biopsy cavity extending 2 cm highly suspicious for continuous disease and another highly suspicious linear non-mass enhancement at the base of the nipple concerning for multifocal multicentric disease. Patient underwent right mastectomy and axillary lymph node dissectionon 09/20/2018. There was no residual invasive mammary carcinoma within the breast. However 15 out of 20 lymph nodes were involved with metastatic carcinoma measuring up to 6 mm and  remaining 5 lymph nodes with isolated tumor cells. Pathologic stage was [id number]  Despite having surgery in May 2020 which was a second surgery-patient has not been able to start adjuvant chemotherapy. Her mastectomy was complicated by flap necrosis requiring debridement antibiotics and she had to be taken back to the OR for the same. Given her age and postmastectomy complications and delayed wound healing-weekly Taxol x12 cycles was chosen as the regimen for her adjuvant chemotherapy which was started on 12/02/2018  Interval history-she is tolerating chemotherapy well with no significant nausea vomiting.  She has mild intermittent tingling numbness in her fingertips which resolves after a few days.  Reports fatigue which lasts for a couple of days post chemo.  ECOG PS- 1 Pain scale- 0   Review of systems- Review of Systems  Constitutional: Positive for malaise/fatigue. Negative for chills, fever and weight loss.  HENT: Negative for congestion, ear discharge and nosebleeds.   Eyes: Negative for blurred vision.  Respiratory: Negative for cough, hemoptysis, sputum production, shortness of breath and wheezing.   Cardiovascular: Negative for chest pain, palpitations, orthopnea and claudication.  Gastrointestinal: Negative for abdominal pain, blood in stool, constipation, diarrhea, heartburn, melena, nausea and vomiting.  Genitourinary: Negative for dysuria, flank pain, frequency, hematuria and urgency.  Musculoskeletal: Negative for back pain, joint pain and myalgias.  Skin: Negative for rash.  Neurological: Positive for sensory change (Peripheral neuropathy). Negative for dizziness, tingling, focal weakness, seizures, weakness and headaches.  Endo/Heme/Allergies: Does not bruise/bleed easily.  Psychiatric/Behavioral: Negative for depression and suicidal ideas. The patient does not have insomnia.       No Known Allergies   Past Medical History:  Diagnosis Date   Arthritis    Breast  cancer ([place])    Diverticulitis  Family history of breast cancer    GERD (gastroesophageal reflux disease)    Hyperlipidemia    Hypertension      Past Surgical History:  Procedure Laterality Date   BREAST BIOPSY Right 05/28/2018   [place] bx, INVASIVE MAMMARY CARCINOMA WITH LOBULAR FEATURES   BREAST LUMPECTOMY Right 06/12/2018   [place], lobular features   BREAST LUMPECTOMY WITH SENTINEL LYMPH NODE BIOPSY Right 06/12/2018   Procedure: RIGHT BREAST WIDE EXCISION WITH SENTINEL LYMPH NODE BX;  Surgeon: Robert Bellow, MD;  Location: [facility] ORS;  Service: General;  Laterality: Right;   [place] AND DRAINAGE OF WOUND Right 10/30/2018   Procedure: IRRIGATION AND DEBRIDEMENT RIGHT CHEST WALL WOUND;  Surgeon: Robert Bellow, MD;  Location: [facility] ORS;  Service: General;  Laterality: Right;   JOINT REPLACEMENT     MASTECTOMY WITH AXILLARY LYMPH NODE DISSECTION Right 09/20/2018   Procedure: MASTECTOMY WITH AXILLARY LYMPH NODE DISSECTION RIGHT;  Surgeon: Robert Bellow, MD;  Location: [facility] ORS;  Service: General;  Laterality: Right;   ovaraian cyst removal Right    Cyst only (NOT OVARY)   PORTACATH PLACEMENT Left 09/20/2018   Procedure: INSERTION PORT-A-CATH, LEFT;  Surgeon: Robert Bellow, MD;  Location: [facility] ORS;  Service: General;  Laterality: Left;   TOTAL KNEE ARTHROPLASTY Bilateral 05/05/2010    Social History   Socioeconomic History   Marital status: Single    Spouse name: Not on file   Number of children: Not on file   Years of education: Not on file   Highest education level: Not on file  Occupational History   Occupation: retired  Scientist, product/process development strain: Not hard at all   Food insecurity    Worry: Never true    Inability: Never true   Transportation needs    Medical: No    Non-medical: No  Tobacco Use   Smoking status: Never Smoker   Smokeless tobacco: Never Used  Substance and  Sexual Activity   Alcohol use: No    Alcohol/week: 0.0 standard drinks   Drug use: No   Sexual activity: Never  Lifestyle   Physical activity    Days per week: Not on file    Minutes per session: Not on file   Stress: Not at all  Relationships   Social connections    Talks on phone: Not on file    Gets together: Not on file    Attends religious service: Not on file    Active member of club or organization: Not on file    Attends meetings of clubs or organizations: Not on file    Relationship status: Not on file   Intimate partner violence    Fear of current or ex partner: Not on file    Emotionally abused: Not on file    Physically abused: Not on file    Forced sexual activity: Not on file  Other Topics Concern   Not on file  Social History Narrative   Retired    Lives by herself    Pets: None   Caffeine- Coffee 2 cups daily, no tea/soda       Family History  Problem Relation Age of Onset   Alcohol abuse Mother        deceased 59   Diabetes Father    Breast cancer Maternal Aunt        dx 72s; deceased 66s   Breast cancer Maternal Aunt  dx 70s; deceased 62s     Current Outpatient Medications:    amLODipine (NORVASC) 5 MG tablet, TAKE 1 TABLET BY MOUTH  DAILY, Disp: 90 tablet, Rfl: 1   atorvastatin (LIPITOR) 80 MG tablet, TAKE 1 TABLET BY MOUTH  DAILY, Disp: 90 tablet, Rfl: 1   CALCIUM-VITAMIN D PO, Take 1 tablet by mouth daily. , Disp: , Rfl:    CRANBERRY PO, Take 1 tablet by mouth 2 (two) times daily. , Disp: , Rfl:    Multiple Vitamins tablet, Take 1 tablet by mouth daily. , Disp: , Rfl:    Omega-3 Fatty Acids (FISH OIL PO), Take 1 capsule by mouth daily. , Disp: , Rfl:    oxybutynin (DITROPAN) 5 MG tablet, TAKE 1 TABLET BY MOUTH TWO  TIMES DAILY, Disp: 180 tablet, Rfl: 1   pantoprazole (PROTONIX) 40 MG tablet, TAKE 1 TABLET BY MOUTH  DAILY, Disp: 90 tablet, Rfl: 1   potassium chloride (K-DUR) 10 MEQ tablet, Take 2 tablets (20 mEq  total) by mouth daily for 14 days., Disp: 28 tablet, Rfl: 0   torsemide (DEMADEX) 5 MG tablet, TAKE 1 TABLET BY MOUTH  DAILY, Disp: 90 tablet, Rfl: 1   Turmeric 500 MG CAPS, Take 1,000 mg by mouth 2 (two) times daily. , Disp: , Rfl:    anastrozole (ARIMIDEX) 1 MG tablet, Take 1 tablet (1 mg total) by mouth daily., Disp: 30 tablet, Rfl: 3   cefadroxil (DURICEF) 500 MG capsule, Take 1 capsule (500 mg total) by mouth 2 (two) times daily., Disp: 20 capsule, Rfl: 0   ciprofloxacin (CIPRO) 500 MG tablet, Take 1 tablet (500 mg total) by mouth 2 (two) times daily., Disp: 20 tablet, Rfl: 0   metroNIDAZOLE (FLAGYL) 500 MG tablet, Take 1 tablet (500 mg total) by mouth 3 (three) times daily., Disp: 30 tablet, Rfl: 0   sulfamethoxazole-trimethoprim (BACTRIM DS) 800-160 MG tablet, Take 1 tablet by mouth 2 (two) times daily., Disp: 20 tablet, Rfl: 0  Current Facility-Administered Medications:    piperacillin-tazobactam (ZOSYN) IVPB 3.375 g, 3.375 g, Intravenous, Once, Byrnett, Forest Gleason, MD  Physical exam: There were no vitals filed for this visit. Physical Exam Constitutional:      General: She is not in acute distress.    Comments: She ambulates with a cane.  HENT:     Head: Normocephalic and atraumatic.  Eyes:     Pupils: Pupils are equal, round, and reactive to light.  Neck:     Musculoskeletal: Normal range of motion.  Cardiovascular:     Rate and Rhythm: Normal rate and regular rhythm.     Heart sounds: Normal heart sounds.  Pulmonary:     Effort: Pulmonary effort is normal.     Breath sounds: Normal breath sounds.  Abdominal:     General: Bowel sounds are normal.     Palpations: Abdomen is soft.  Skin:    General: Skin is warm and dry.  Neurological:     Mental Status: She is alert and oriented to person, place, and time.      CMP Latest Ref Rng & Units 01/13/2019  Glucose 70 - 99 mg/dL 125(H)  BUN 8 - 23 mg/dL 9  Creatinine 0.44 - 1.00 mg/dL 0.59  Sodium 135 - 145 mmol/L  139  Potassium 3.5 - 5.1 mmol/L 3.7  Chloride 98 - 111 mmol/L 104  CO2 22 - 32 mmol/L 27  Calcium 8.9 - 10.3 mg/dL 8.9  Total Protein 6.5 - 8.1 g/dL 6.7  Total  Bilirubin 0.3 - 1.2 mg/dL 0.4  Alkaline Phos 38 - 126 U/L 97  AST 15 - 41 U/L 21  ALT 0 - 44 U/L 22   CBC Latest Ref Rng & Units 01/13/2019  WBC 4.0 - 10.5 K/uL 8.2  Hemoglobin 12.0 - 15.0 g/dL 12.4  Hematocrit 36.0 - 46.0 % 38.4  Platelets 150 - 400 K/uL 340    No images are attached to the encounter.  Ct Abdomen Pelvis W Contrast  Result Date: 12/24/2018 CLINICAL DATA:  Left lower quadrant pain, nausea, and diarrhea for 1 week. Diverticulosis. Currently undergoing chemotherapy for breast carcinoma. EXAM: CT ABDOMEN AND PELVIS WITH CONTRAST TECHNIQUE: Multidetector CT imaging of the abdomen and pelvis was performed using the standard protocol following bolus administration of intravenous contrast. CONTRAST:  112m OMNIPAQUE IOHEXOL 300 MG/ML  SOLN COMPARISON:  10/11/2018 FINDINGS: Lower Chest: No acute findings. Hepatobiliary: A few tiny sub-cm low-attenuation lesions in right and left lobes remain stable, most likely representing cysts. No definite hepatic masses identified. Prior cholecystectomy. No evidence of biliary obstruction. Pancreas:  No mass or inflammatory changes. Spleen: Within normal limits in size and appearance. Adrenals/Urinary Tract: No masses identified. No evidence of hydronephrosis. Stomach/Bowel: No evidence of obstruction, inflammatory process or abnormal fluid collections. Mild diverticulosis is seen involving the sigmoid colon, however there is no evidence of diverticulitis. Vascular/Lymphatic: No pathologically enlarged lymph nodes. No abdominal aortic aneurysm. Aortic atherosclerosis. Reproductive:  No mass or other significant abnormality. Other:  None. Musculoskeletal:  No suspicious bone lesions identified. IMPRESSION: Mild sigmoid diverticulosis. No evidence of diverticulitis or other acute findings. No  evidence of abdominal or pelvic metastatic disease. Aortic Atherosclerosis (ICD10-I70.0). Electronically Signed   By: JMarlaine HindM.D.   On: 12/24/2018 15:04     Assessment and plan- Patient is a 78y.o. female  with invasive mammary carcinoma of the right breast pathological prognostic stage III [id number] cNX ER PR positive HER-2/neu negative status post right mastectomy and axillary lymph node dissection. She is here for on treatment assessment prior to cycle 7 of weekly Taxol chemotherapy  Counts okay to proceed with cycle 7 of weekly Taxol chemotherapy today.  She is getting dose reduced Taxol at 65 mg per metered square.  She will directly proceed for cycle 8 next week and I will see her back in 2 weeks for cycle 9.  Chemo-induced peripheral neuropathy: I will consider acupuncture after chemotherapy is done.  Hypokalemia: We will give 20 mEq of IV potassium today and she will continue oral potassium supplements  Patient will likely establish follow-up with Dr. BBary Castillaupon chemotherapy completion.  At that time decision needs to be made if she would be a candidate for postmastectomy radiation based on her wound healing given that there was concern for flap necrosis in the past.   Visit Diagnosis 1. Encounter for antineoplastic chemotherapy   2. Malignant neoplasm of upper-outer quadrant of right breast in female, estrogen receptor positive ([place]   3. Chemotherapy-induced peripheral neuropathy ([place]   4. Hypokalemia      Dr. ARanda Evens MD, MPH [id number]/28/2020 10:46 AM

## 2019-01-20 NOTE — Progress Notes (Signed)
Pt having some tingling on all finger tips of both hands. Her feet has bothered her  For several years and nothing has changed for feet

## 2019-01-24 ENCOUNTER — Other Ambulatory Visit: Payer: Self-pay

## 2019-01-27 ENCOUNTER — Inpatient Hospital Stay: Payer: Medicare Other

## 2019-01-27 ENCOUNTER — Other Ambulatory Visit: Payer: Self-pay

## 2019-01-28 ENCOUNTER — Inpatient Hospital Stay: Payer: Medicare Other

## 2019-01-28 ENCOUNTER — Other Ambulatory Visit: Payer: Self-pay

## 2019-01-28 ENCOUNTER — Inpatient Hospital Stay: Payer: Medicare Other | Attending: Oncology | Admitting: *Deleted

## 2019-01-28 VITALS — BP 124/75 | HR 80 | Temp 99.8°F | Resp 18

## 2019-01-28 DIAGNOSIS — C50411 Malignant neoplasm of upper-outer quadrant of right female breast: Secondary | ICD-10-CM | POA: Insufficient documentation

## 2019-01-28 DIAGNOSIS — Z5111 Encounter for antineoplastic chemotherapy: Secondary | ICD-10-CM | POA: Diagnosis not present

## 2019-01-28 DIAGNOSIS — G620 Drug-induced polyneuropathy: Secondary | ICD-10-CM | POA: Diagnosis not present

## 2019-01-28 DIAGNOSIS — Z170 Estrogen receptor positive status [ER+]: Secondary | ICD-10-CM | POA: Insufficient documentation

## 2019-01-28 DIAGNOSIS — E876 Hypokalemia: Secondary | ICD-10-CM | POA: Diagnosis not present

## 2019-01-28 DIAGNOSIS — Z79899 Other long term (current) drug therapy: Secondary | ICD-10-CM | POA: Insufficient documentation

## 2019-01-28 DIAGNOSIS — T451X5A Adverse effect of antineoplastic and immunosuppressive drugs, initial encounter: Secondary | ICD-10-CM | POA: Insufficient documentation

## 2019-01-28 DIAGNOSIS — C50911 Malignant neoplasm of unspecified site of right female breast: Secondary | ICD-10-CM

## 2019-01-28 DIAGNOSIS — Z9011 Acquired absence of right breast and nipple: Secondary | ICD-10-CM | POA: Insufficient documentation

## 2019-01-28 DIAGNOSIS — Z95828 Presence of other vascular implants and grafts: Secondary | ICD-10-CM

## 2019-01-28 LAB — CBC WITH DIFFERENTIAL/PLATELET
Abs Immature Granulocytes: 0.02 10*3/uL (ref 0.00–0.07)
Basophils Absolute: 0 10*3/uL (ref 0.0–0.1)
Basophils Relative: 1 %
Eosinophils Absolute: 0.1 10*3/uL (ref 0.0–0.5)
Eosinophils Relative: 2 %
HCT: 35.5 % — ABNORMAL LOW (ref 36.0–46.0)
Hemoglobin: 11.7 g/dL — ABNORMAL LOW (ref 12.0–15.0)
Immature Granulocytes: 0 %
Lymphocytes Relative: 40 %
Lymphs Abs: 2.2 10*3/uL (ref 0.7–4.0)
MCH: 29.3 pg (ref 26.0–34.0)
MCHC: 33 g/dL (ref 30.0–36.0)
MCV: 88.8 fL (ref 80.0–100.0)
Monocytes Absolute: 0.5 10*3/uL (ref 0.1–1.0)
Monocytes Relative: 8 %
Neutro Abs: 2.7 10*3/uL (ref 1.7–7.7)
Neutrophils Relative %: 49 %
Platelets: 299 10*3/uL (ref 150–400)
RBC: 4 MIL/uL (ref 3.87–5.11)
RDW: 17.2 % — ABNORMAL HIGH (ref 11.5–15.5)
WBC: 5.6 10*3/uL (ref 4.0–10.5)
nRBC: 0 % (ref 0.0–0.2)

## 2019-01-28 LAB — COMPREHENSIVE METABOLIC PANEL
ALT: 19 U/L (ref 0–44)
AST: 20 U/L (ref 15–41)
Albumin: 3.5 g/dL (ref 3.5–5.0)
Alkaline Phosphatase: 100 U/L (ref 38–126)
Anion gap: 9 (ref 5–15)
BUN: 8 mg/dL (ref 8–23)
CO2: 28 mmol/L (ref 22–32)
Calcium: 8.8 mg/dL — ABNORMAL LOW (ref 8.9–10.3)
Chloride: 102 mmol/L (ref 98–111)
Creatinine, Ser: 0.5 mg/dL (ref 0.44–1.00)
GFR calc Af Amer: >60 mL/min (ref >60)
GFR calc non Af Amer: >60 mL/min (ref >60)
Glucose, Bld: 124 mg/dL — ABNORMAL HIGH (ref 70–99)
Potassium: 3.6 mmol/L (ref 3.5–5.1)
Sodium: 139 mmol/L (ref 135–145)
Total Bilirubin: 0.5 mg/dL (ref 0.3–1.2)
Total Protein: 6.3 g/dL — ABNORMAL LOW (ref 6.5–8.1)

## 2019-01-28 MED ORDER — SODIUM CHLORIDE 0.9% FLUSH
10.0000 mL | Freq: Once | INTRAVENOUS | Status: AC
  Administered 2019-01-28: 10 mL via INTRAVENOUS
  Filled 2019-01-28: qty 10

## 2019-01-28 MED ORDER — FAMOTIDINE IN NACL 20-0.9 MG/50ML-% IV SOLN
20.0000 mg | Freq: Once | INTRAVENOUS | Status: AC
  Administered 2019-01-28: 20 mg via INTRAVENOUS
  Filled 2019-01-28: qty 50

## 2019-01-28 MED ORDER — HEPARIN SOD (PORK) LOCK FLUSH 100 UNIT/ML IV SOLN
500.0000 [IU] | Freq: Once | INTRAVENOUS | Status: AC
  Administered 2019-01-28: 500 [IU] via INTRAVENOUS

## 2019-01-28 MED ORDER — SODIUM CHLORIDE 0.9 % IV SOLN
Freq: Once | INTRAVENOUS | Status: AC
  Administered 2019-01-28: 11:00:00 via INTRAVENOUS
  Filled 2019-01-28: qty 250

## 2019-01-28 MED ORDER — SODIUM CHLORIDE 0.9 % IV SOLN
65.0000 mg/m2 | Freq: Once | INTRAVENOUS | Status: AC
  Administered 2019-01-28: 126 mg via INTRAVENOUS
  Filled 2019-01-28: qty 21

## 2019-01-28 MED ORDER — DIPHENHYDRAMINE HCL 50 MG/ML IJ SOLN
50.0000 mg | Freq: Once | INTRAMUSCULAR | Status: AC
  Administered 2019-01-28: 50 mg via INTRAVENOUS
  Filled 2019-01-28: qty 1

## 2019-01-28 MED ORDER — SODIUM CHLORIDE 0.9 % IV SOLN
20.0000 mg | Freq: Once | INTRAVENOUS | Status: AC
  Administered 2019-01-28: 12:00:00 20 mg via INTRAVENOUS
  Filled 2019-01-28: qty 2

## 2019-01-31 ENCOUNTER — Encounter: Payer: Self-pay | Admitting: Oncology

## 2019-01-31 NOTE — Progress Notes (Signed)
Patient reports tingling sensation in her fingers for the few days.

## 2019-02-03 ENCOUNTER — Inpatient Hospital Stay: Payer: Medicare Other | Admitting: *Deleted

## 2019-02-03 ENCOUNTER — Inpatient Hospital Stay (HOSPITAL_BASED_OUTPATIENT_CLINIC_OR_DEPARTMENT_OTHER): Payer: Medicare Other | Admitting: Oncology

## 2019-02-03 ENCOUNTER — Inpatient Hospital Stay: Payer: Medicare Other

## 2019-02-03 ENCOUNTER — Other Ambulatory Visit: Payer: Self-pay

## 2019-02-03 VITALS — BP 123/65 | HR 94 | Temp 98.7°F | Ht 65.0 in | Wt 186.0 lb

## 2019-02-03 DIAGNOSIS — Z170 Estrogen receptor positive status [ER+]: Secondary | ICD-10-CM

## 2019-02-03 DIAGNOSIS — G620 Drug-induced polyneuropathy: Secondary | ICD-10-CM | POA: Diagnosis not present

## 2019-02-03 DIAGNOSIS — E876 Hypokalemia: Secondary | ICD-10-CM | POA: Diagnosis not present

## 2019-02-03 DIAGNOSIS — Z95828 Presence of other vascular implants and grafts: Secondary | ICD-10-CM

## 2019-02-03 DIAGNOSIS — Z5111 Encounter for antineoplastic chemotherapy: Secondary | ICD-10-CM | POA: Diagnosis not present

## 2019-02-03 DIAGNOSIS — C50911 Malignant neoplasm of unspecified site of right female breast: Secondary | ICD-10-CM

## 2019-02-03 DIAGNOSIS — C50411 Malignant neoplasm of upper-outer quadrant of right female breast: Secondary | ICD-10-CM | POA: Diagnosis not present

## 2019-02-03 DIAGNOSIS — T451X5A Adverse effect of antineoplastic and immunosuppressive drugs, initial encounter: Secondary | ICD-10-CM | POA: Diagnosis not present

## 2019-02-03 DIAGNOSIS — Z9011 Acquired absence of right breast and nipple: Secondary | ICD-10-CM | POA: Diagnosis not present

## 2019-02-03 DIAGNOSIS — Z79899 Other long term (current) drug therapy: Secondary | ICD-10-CM | POA: Diagnosis not present

## 2019-02-03 LAB — CBC WITH DIFFERENTIAL/PLATELET
Abs Immature Granulocytes: 0.04 10*3/uL (ref 0.00–0.07)
Basophils Absolute: 0 10*3/uL (ref 0.0–0.1)
Basophils Relative: 0 %
Eosinophils Absolute: 0.1 10*3/uL (ref 0.0–0.5)
Eosinophils Relative: 1 %
HCT: 35.6 % — ABNORMAL LOW (ref 36.0–46.0)
Hemoglobin: 11.7 g/dL — ABNORMAL LOW (ref 12.0–15.0)
Immature Granulocytes: 1 %
Lymphocytes Relative: 41 %
Lymphs Abs: 2.4 10*3/uL (ref 0.7–4.0)
MCH: 29.3 pg (ref 26.0–34.0)
MCHC: 32.9 g/dL (ref 30.0–36.0)
MCV: 89.2 fL (ref 80.0–100.0)
Monocytes Absolute: 0.4 10*3/uL (ref 0.1–1.0)
Monocytes Relative: 7 %
Neutro Abs: 2.9 10*3/uL (ref 1.7–7.7)
Neutrophils Relative %: 50 %
Platelets: 306 10*3/uL (ref 150–400)
RBC: 3.99 MIL/uL (ref 3.87–5.11)
RDW: 17.3 % — ABNORMAL HIGH (ref 11.5–15.5)
WBC: 5.8 10*3/uL (ref 4.0–10.5)
nRBC: 0 % (ref 0.0–0.2)

## 2019-02-03 LAB — COMPREHENSIVE METABOLIC PANEL
ALT: 18 U/L (ref 0–44)
AST: 20 U/L (ref 15–41)
Albumin: 3.6 g/dL (ref 3.5–5.0)
Alkaline Phosphatase: 94 U/L (ref 38–126)
Anion gap: 8 (ref 5–15)
BUN: 6 mg/dL — ABNORMAL LOW (ref 8–23)
CO2: 26 mmol/L (ref 22–32)
Calcium: 8.5 mg/dL — ABNORMAL LOW (ref 8.9–10.3)
Chloride: 106 mmol/L (ref 98–111)
Creatinine, Ser: 0.39 mg/dL — ABNORMAL LOW (ref 0.44–1.00)
GFR calc Af Amer: >60 mL/min (ref >60)
GFR calc non Af Amer: >60 mL/min (ref >60)
Glucose, Bld: 112 mg/dL — ABNORMAL HIGH (ref 70–99)
Potassium: 3.3 mmol/L — ABNORMAL LOW (ref 3.5–5.1)
Sodium: 140 mmol/L (ref 135–145)
Total Bilirubin: 0.6 mg/dL (ref 0.3–1.2)
Total Protein: 6.4 g/dL — ABNORMAL LOW (ref 6.5–8.1)

## 2019-02-03 MED ORDER — HEPARIN SOD (PORK) LOCK FLUSH 100 UNIT/ML IV SOLN
500.0000 [IU] | Freq: Once | INTRAVENOUS | Status: AC | PRN
  Administered 2019-02-03: 500 [IU]
  Filled 2019-02-03: qty 5

## 2019-02-03 MED ORDER — FAMOTIDINE IN NACL 20-0.9 MG/50ML-% IV SOLN
20.0000 mg | Freq: Once | INTRAVENOUS | Status: AC
  Administered 2019-02-03: 20 mg via INTRAVENOUS
  Filled 2019-02-03: qty 50

## 2019-02-03 MED ORDER — SODIUM CHLORIDE 0.9 % IV SOLN
Freq: Once | INTRAVENOUS | Status: AC
  Administered 2019-02-03: 12:00:00 via INTRAVENOUS
  Filled 2019-02-03: qty 250

## 2019-02-03 MED ORDER — SODIUM CHLORIDE 0.9% FLUSH
10.0000 mL | Freq: Once | INTRAVENOUS | Status: AC
  Administered 2019-02-03: 11:00:00 10 mL via INTRAVENOUS
  Filled 2019-02-03: qty 10

## 2019-02-03 MED ORDER — POTASSIUM CHLORIDE 20 MEQ/100ML IV SOLN
20.0000 meq | Freq: Once | INTRAVENOUS | Status: AC
  Administered 2019-02-03: 20 meq via INTRAVENOUS

## 2019-02-03 MED ORDER — SODIUM CHLORIDE 0.9 % IV SOLN
65.0000 mg/m2 | Freq: Once | INTRAVENOUS | Status: AC
  Administered 2019-02-03: 126 mg via INTRAVENOUS
  Filled 2019-02-03: qty 21

## 2019-02-03 MED ORDER — DIPHENHYDRAMINE HCL 50 MG/ML IJ SOLN
50.0000 mg | Freq: Once | INTRAMUSCULAR | Status: AC
  Administered 2019-02-03: 50 mg via INTRAVENOUS
  Filled 2019-02-03: qty 1

## 2019-02-03 MED ORDER — SODIUM CHLORIDE 0.9 % IV SOLN
20.0000 mg | Freq: Once | INTRAVENOUS | Status: AC
  Administered 2019-02-03: 20 mg via INTRAVENOUS
  Filled 2019-02-03: qty 2

## 2019-02-03 MED ORDER — SODIUM CHLORIDE 0.9 % IV SOLN
Freq: Once | INTRAVENOUS | Status: DC
  Filled 2019-02-03: qty 100

## 2019-02-03 NOTE — Progress Notes (Signed)
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 Patient underwent lumpectomy and sentinel lymph node biopsy in February 2020. Pathology showed invasive lobular carcinoma with positive medial margin which was reexcised with still positive. Tumor was 24 mm, grade 2 ER PR positive and HER-2/neu negative. One sentinel lymph node that was excised was positive for macro met of 8 mm. No extranodal extension was present. MammaPrint came back as high risk with an average 10-year risk of untreated disease is 29%. Patient then underwent a bilateral MRI which showed additional suspicious non-mass enhancement along the inferior margin of the biopsy cavity extending 2 cm highly suspicious for continuous disease and another highly suspicious linear non-mass enhancement at the base of the nipple concerning for multifocal multicentric disease. Patient underwent right mastectomy and axillary lymph node dissectionon 09/20/2018. There was no residual invasive mammary carcinoma within the breast. However 15 out of 20 lymph nodes were involved with metastatic carcinoma measuring up to 6 mm and remaining  5 lymph nodes with isolated tumor cells. Pathologic stage was [id number]  Despite having surgery in May 2020 which was a second surgery-patient has not been able to start adjuvant chemotherapy. Her mastectomy was complicated by flap necrosis requiring debridement antibiotics and she had to be taken back to the OR for the same. Given her age and postmastectomy complications and delayed wound healing-weekly Taxol x12 cycles was chosen as the regimen for her adjuvant chemotherapy which was started on 12/02/2018   Interval history- she reports tolerating chemotherapy well so far. 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 Denies other complaints at this time  ECOG PS- 1 Pain scale- 0   Review of systems- Review of Systems  Constitutional: Negative for chills, fever, malaise/fatigue and weight loss.  HENT: Negative for congestion, ear discharge and nosebleeds.   Eyes: Negative for blurred vision.  Respiratory: Negative for cough, hemoptysis, sputum production, shortness of breath and wheezing.   Cardiovascular: Negative for chest pain, palpitations, orthopnea and claudication.  Gastrointestinal: Negative for abdominal pain, blood in stool, constipation, diarrhea, heartburn, melena, nausea and vomiting.  Genitourinary: Negative for dysuria, flank pain, frequency, hematuria and urgency.  Musculoskeletal: Negative for back pain, joint pain and myalgias.  Skin: Negative for rash.  Neurological: Negative for dizziness, tingling, focal weakness, seizures, weakness and headaches.  Endo/Heme/Allergies: Does not bruise/bleed easily.  Psychiatric/Behavioral: Negative for depression and suicidal ideas. The patient does not have insomnia.       No Known Allergies   Past Medical History:  Diagnosis Date  . Arthritis   . Breast cancer ([place])   . Diverticulitis   . Family history of breast cancer   . GERD (gastroesophageal reflux disease)   . Hyperlipidemia   . Hypertension  Past Surgical History:  Procedure Laterality Date  . BREAST BIOPSY Right 05/28/2018   [place] bx, INVASIVE MAMMARY CARCINOMA WITH LOBULAR FEATURES  . BREAST LUMPECTOMY Right 06/12/2018   [facility], lobular features  . BREAST LUMPECTOMY WITH SENTINEL LYMPH NODE BIOPSY Right 06/12/2018   Procedure: RIGHT BREAST WIDE EXCISION WITH SENTINEL LYMPH NODE BX;  Surgeon: Robert Bellow, MD;  Location: [facility] ORS;  Service: General;  Laterality: Right;  . CHOLECYSTECTOMY    . [place]  . INCISION AND DRAINAGE OF WOUND Right 10/30/2018   Procedure: IRRIGATION AND DEBRIDEMENT RIGHT CHEST WALL WOUND;  Surgeon: Robert Bellow, MD;  Location: [facility] ORS;  Service: General;  Laterality: Right;  . JOINT REPLACEMENT    . MASTECTOMY WITH AXILLARY LYMPH NODE DISSECTION Right 09/20/2018   Procedure: MASTECTOMY WITH AXILLARY LYMPH NODE DISSECTION RIGHT;  Surgeon: Robert Bellow, MD;  Location: [facility] ORS;  Service: General;  Laterality: Right;  . ovaraian cyst removal Right    Cyst only (NOT OVARY)  . PORTACATH PLACEMENT Left 09/20/2018   Procedure: INSERTION PORT-A-CATH, LEFT;  Surgeon: Robert Bellow, MD;  Location: [facility] ORS;  Service: General;  Laterality: Left;  . TOTAL KNEE ARTHROPLASTY Bilateral 05/05/2010    Social History   Socioeconomic History  . Marital status: Single    Spouse name: Not on file  . Number of children: Not on file  . Years of education: Not on file  . Highest education level: Not on file  Occupational History  . Occupation: retired  Scientific laboratory technician  . Financial resource strain: Not hard at all  . Food insecurity    Worry: Never true    Inability: Never true  . Transportation needs    Medical: No    Non-medical: No  Tobacco Use  . Smoking status: Never Smoker  . Smokeless tobacco: Never Used  Substance and Sexual Activity  . Alcohol use: No    Alcohol/week: 0.0 standard drinks  . Drug use: No  . Sexual activity: Never  Lifestyle  . Physical activity     Days per week: Not on file    Minutes per session: Not on file  . Stress: Not at all  Relationships  . Social Herbalist on phone: Not on file    Gets together: Not on file    Attends religious service: Not on file    Active member of club or organization: Not on file    Attends meetings of clubs or organizations: Not on file    Relationship status: Not on file  . Intimate partner violence    Fear of current or ex partner: Not on file    Emotionally abused: Not on file    Physically abused: Not on file    Forced sexual activity: Not on file  Other Topics Concern  . Not on file  Social History Narrative   Retired    Lives by herself    Pets: None   Caffeine- Coffee 2 cups daily, no tea/soda       Family History  Problem Relation Age of Onset  . Alcohol abuse Mother        deceased 72  . Diabetes Father   . Breast cancer Maternal Aunt        dx 64s; deceased 4s  . Breast cancer Maternal Aunt        dx 24s; deceased 27s     Current Outpatient Medications:  .  amLODipine (NORVASC) 5 MG  tablet, TAKE 1 TABLET BY MOUTH  DAILY, Disp: 90 tablet, Rfl: 1 .  atorvastatin (LIPITOR) 80 MG tablet, TAKE 1 TABLET BY MOUTH  DAILY, Disp: 90 tablet, Rfl: 1 .  CALCIUM-VITAMIN D PO, Take 1 tablet by mouth daily. , Disp: , Rfl:  .  CRANBERRY PO, Take 1 tablet by mouth 2 (two) times daily. , Disp: , Rfl:  .  Multiple Vitamins tablet, Take 1 tablet by mouth daily. , Disp: , Rfl:  .  Omega-3 Fatty Acids (FISH OIL PO), Take 1 capsule by mouth daily. , Disp: , Rfl:  .  oxybutynin (DITROPAN) 5 MG tablet, TAKE 1 TABLET BY MOUTH TWO  TIMES DAILY, Disp: 180 tablet, Rfl: 1 .  pantoprazole (PROTONIX) 40 MG tablet, TAKE 1 TABLET BY MOUTH  DAILY, Disp: 90 tablet, Rfl: 1 .  torsemide (DEMADEX) 5 MG tablet, TAKE 1 TABLET BY MOUTH  DAILY, Disp: 90 tablet, Rfl: 1 .  Turmeric 500 MG CAPS, Take 1,000 mg by mouth 2 (two) times daily. , Disp: , Rfl:  .  potassium chloride (K-DUR) 10 MEQ tablet, Take  2 tablets (20 mEq total) by mouth daily for 14 days., Disp: 28 tablet, Rfl: 0  Current Facility-Administered Medications:  .  piperacillin-tazobactam (ZOSYN) IVPB 3.375 g, 3.375 g, Intravenous, Once, Byrnett, Forest Gleason, MD  Facility-Administered Medications Ordered in Other Visits:  .  dexamethasone (DECADRON) 20 mg in sodium chloride 0.9 % 50 mL IVPB, 20 mg, Intravenous, Once, Sindy Guadeloupe, MD, Last Rate: 208 mL/hr at 02/03/19 1227, 20 mg at 02/03/19 1227 .  famotidine (PEPCID) IVPB 20 mg premix, 20 mg, Intravenous, Once, Sindy Guadeloupe, MD, Last Rate: 100 mL/hr at 02/03/19 1211, 20 mg at 02/03/19 1211 .  heparin lock flush 100 unit/mL, 500 Units, Intracatheter, Once PRN, Sindy Guadeloupe, MD .  PACLitaxel (TAXOL) 126 mg in sodium chloride 0.9 % 250 mL chemo infusion (</= 7m/m2), 65 mg/m2 (Treatment Plan Recorded), Intravenous, Once, RSindy Guadeloupe MD .  potassium chloride 20 mEq in 100 mL IVPB, 20 mEq, Intravenous, Once, RSindy Guadeloupe MD  Physical exam:  Vitals:   02/03/19 1127  BP: 123/65  Pulse: 94  Temp: 98.7 F (37.1 C)  TempSrc: Tympanic  Weight: 186 lb (84.4 kg)  Height: '5\' 5"'  (1.651 m)   Physical Exam Constitutional:      Comments: Elderly female who ambulates with a cane.  Appears in no acute distress.  Sleeve over her right upper extremity  HENT:     Head: Normocephalic and atraumatic.  Eyes:     Pupils: Pupils are equal, round, and reactive to light.  Neck:     Musculoskeletal: Normal range of motion.  Cardiovascular:     Rate and Rhythm: Normal rate and regular rhythm.     Heart sounds: Normal heart sounds.  Pulmonary:     Effort: Pulmonary effort is normal.     Breath sounds: Normal breath sounds.  Abdominal:     General: Bowel sounds are normal.     Palpations: Abdomen is soft.  Skin:    General: Skin is warm and dry.  Neurological:     Mental Status: She is alert and oriented to person, place, and time.      CMP Latest Ref Rng & Units 02/03/2019   Glucose 70 - 99 mg/dL 112(H)  BUN 8 - 23 mg/dL 6(L)  Creatinine 0.44 - 1.00 mg/dL 0.39(L)  Sodium 135 - 145 mmol/L 140  Potassium 3.5 - 5.1 mmol/L 3.3(L)  Chloride 98 - 111 mmol/L 106  CO2 22 - 32 mmol/L 26  Calcium 8.9 - 10.3 mg/dL 8.5(L)  Total Protein 6.5 - 8.1 g/dL 6.4(L)  Total Bilirubin 0.3 - 1.2 mg/dL 0.6  Alkaline Phos 38 - 126 U/L 94  AST 15 - 41 U/L 20  ALT 0 - 44 U/L 18   CBC Latest Ref Rng & Units 02/03/2019  WBC 4.0 - 10.5 K/uL 5.8  Hemoglobin 12.0 - 15.0 g/dL 11.7(L)  Hematocrit 36.0 - 46.0 % 35.6(L)  Platelets 150 - 400 K/uL 306      Assessment and plan- Patient is a 78 y.o. female with invasive mammary carcinoma of the right breast pathological prognostic stage III [id number] cNX ER PR positive HER-2/neu negative status post right mastectomy and axillary lymph node dissection. She is here for on treatment assessment prior to cycle 9 of weekly Taxol chemotherapy  Counts okay to proceed with cycle 9 of weekly Taxol chemotherapy today.   she will direct proceed to cycle 10 next week and I will see her back in 2 weeks for cycle 11.  Plan is to complete 12 weeks of Taxol chemotherapy followed by hormone therapy.  She has an appointment coming up with Dr. Bary Castilla and I would like to get his opinion to see if she would be a candidate at this time for postmastectomy radiation if her mastectomy wound has healed well.  Chemo-induced peripheral neuropathy: Mild grade 1.  I will refer her to acupuncture upon completion of chemotherapy.  Hypokalemia: We will give her 20 mEq of IV potassium today and possibly next week as well.  She will continue oral potassium 20 mEq.   Visit Diagnosis 1. Encounter for antineoplastic chemotherapy   2. Malignant neoplasm of upper-outer quadrant of right breast in female, estrogen receptor positive ([place])   3. Hypokalemia   4. Chemotherapy-induced peripheral neuropathy ([place])      Dr. Randa Evens, MD, MPH [facility] [phone] 02/03/2019 12:29 PM

## 2019-02-07 ENCOUNTER — Other Ambulatory Visit: Payer: Self-pay

## 2019-02-10 ENCOUNTER — Telehealth: Payer: Self-pay | Admitting: *Deleted

## 2019-02-10 ENCOUNTER — Inpatient Hospital Stay: Payer: Medicare Other

## 2019-02-10 ENCOUNTER — Other Ambulatory Visit: Payer: Self-pay | Admitting: Oncology

## 2019-02-10 ENCOUNTER — Other Ambulatory Visit: Payer: Self-pay | Admitting: *Deleted

## 2019-02-10 ENCOUNTER — Other Ambulatory Visit: Payer: Self-pay

## 2019-02-10 VITALS — BP 138/74 | HR 92 | Temp 96.6°F | Resp 20 | Wt 186.0 lb

## 2019-02-10 DIAGNOSIS — R300 Dysuria: Secondary | ICD-10-CM

## 2019-02-10 DIAGNOSIS — Z170 Estrogen receptor positive status [ER+]: Secondary | ICD-10-CM | POA: Diagnosis not present

## 2019-02-10 DIAGNOSIS — Z5111 Encounter for antineoplastic chemotherapy: Secondary | ICD-10-CM | POA: Diagnosis not present

## 2019-02-10 DIAGNOSIS — Z79899 Other long term (current) drug therapy: Secondary | ICD-10-CM | POA: Diagnosis not present

## 2019-02-10 DIAGNOSIS — Z9011 Acquired absence of right breast and nipple: Secondary | ICD-10-CM | POA: Diagnosis not present

## 2019-02-10 DIAGNOSIS — E876 Hypokalemia: Secondary | ICD-10-CM | POA: Diagnosis not present

## 2019-02-10 DIAGNOSIS — Z95828 Presence of other vascular implants and grafts: Secondary | ICD-10-CM

## 2019-02-10 DIAGNOSIS — T451X5A Adverse effect of antineoplastic and immunosuppressive drugs, initial encounter: Secondary | ICD-10-CM | POA: Diagnosis not present

## 2019-02-10 DIAGNOSIS — C50911 Malignant neoplasm of unspecified site of right female breast: Secondary | ICD-10-CM

## 2019-02-10 DIAGNOSIS — G620 Drug-induced polyneuropathy: Secondary | ICD-10-CM | POA: Diagnosis not present

## 2019-02-10 DIAGNOSIS — C50411 Malignant neoplasm of upper-outer quadrant of right female breast: Secondary | ICD-10-CM | POA: Diagnosis not present

## 2019-02-10 LAB — COMPREHENSIVE METABOLIC PANEL
ALT: 20 U/L (ref 0–44)
AST: 21 U/L (ref 15–41)
Albumin: 3.6 g/dL (ref 3.5–5.0)
Alkaline Phosphatase: 96 U/L (ref 38–126)
Anion gap: 11 (ref 5–15)
BUN: 6 mg/dL — ABNORMAL LOW (ref 8–23)
CO2: 26 mmol/L (ref 22–32)
Calcium: 9 mg/dL (ref 8.9–10.3)
Chloride: 104 mmol/L (ref 98–111)
Creatinine, Ser: 0.54 mg/dL (ref 0.44–1.00)
GFR calc Af Amer: >60 mL/min (ref >60)
GFR calc non Af Amer: >60 mL/min (ref >60)
Glucose, Bld: 122 mg/dL — ABNORMAL HIGH (ref 70–99)
Potassium: 3 mmol/L — ABNORMAL LOW (ref 3.5–5.1)
Sodium: 141 mmol/L (ref 135–145)
Total Bilirubin: 0.6 mg/dL (ref 0.3–1.2)
Total Protein: 6.7 g/dL (ref 6.5–8.1)

## 2019-02-10 LAB — CBC WITH DIFFERENTIAL/PLATELET
Abs Immature Granulocytes: 0.06 10*3/uL (ref 0.00–0.07)
Basophils Absolute: 0 10*3/uL (ref 0.0–0.1)
Basophils Relative: 0 %
Eosinophils Absolute: 0.1 10*3/uL (ref 0.0–0.5)
Eosinophils Relative: 2 %
HCT: 36.5 % (ref 36.0–46.0)
Hemoglobin: 11.9 g/dL — ABNORMAL LOW (ref 12.0–15.0)
Immature Granulocytes: 1 %
Lymphocytes Relative: 36 %
Lymphs Abs: 2.5 10*3/uL (ref 0.7–4.0)
MCH: 29 pg (ref 26.0–34.0)
MCHC: 32.6 g/dL (ref 30.0–36.0)
MCV: 88.8 fL (ref 80.0–100.0)
Monocytes Absolute: 0.6 10*3/uL (ref 0.1–1.0)
Monocytes Relative: 9 %
Neutro Abs: 3.5 10*3/uL (ref 1.7–7.7)
Neutrophils Relative %: 52 %
Platelets: 357 10*3/uL (ref 150–400)
RBC: 4.11 MIL/uL (ref 3.87–5.11)
RDW: 17.6 % — ABNORMAL HIGH (ref 11.5–15.5)
WBC: 6.8 10*3/uL (ref 4.0–10.5)
nRBC: 0 % (ref 0.0–0.2)

## 2019-02-10 LAB — URINALYSIS, COMPLETE (UACMP) WITH MICROSCOPIC
Bacteria, UA: NONE SEEN
Bilirubin Urine: NEGATIVE
Glucose, UA: NEGATIVE mg/dL
Hgb urine dipstick: NEGATIVE
Ketones, ur: NEGATIVE mg/dL
Nitrite: NEGATIVE
Protein, ur: NEGATIVE mg/dL
Specific Gravity, Urine: 1.004 — ABNORMAL LOW (ref 1.005–1.030)
pH: 6 (ref 5.0–8.0)

## 2019-02-10 MED ORDER — HEPARIN SOD (PORK) LOCK FLUSH 100 UNIT/ML IV SOLN
500.0000 [IU] | Freq: Once | INTRAVENOUS | Status: AC | PRN
  Administered 2019-02-10: 500 [IU]
  Filled 2019-02-10: qty 5

## 2019-02-10 MED ORDER — FAMOTIDINE IN NACL 20-0.9 MG/50ML-% IV SOLN
20.0000 mg | Freq: Once | INTRAVENOUS | Status: AC
  Administered 2019-02-10: 20 mg via INTRAVENOUS
  Filled 2019-02-10: qty 50

## 2019-02-10 MED ORDER — SODIUM CHLORIDE 0.9 % IV SOLN
20.0000 mg | Freq: Once | INTRAVENOUS | Status: AC
  Administered 2019-02-10: 20 mg via INTRAVENOUS
  Filled 2019-02-10: qty 2

## 2019-02-10 MED ORDER — SODIUM CHLORIDE 0.9% FLUSH
10.0000 mL | Freq: Once | INTRAVENOUS | Status: AC
  Administered 2019-02-10: 10:00:00 10 mL via INTRAVENOUS
  Filled 2019-02-10: qty 10

## 2019-02-10 MED ORDER — SODIUM CHLORIDE 0.9 % IV SOLN
Freq: Once | INTRAVENOUS | Status: AC
  Administered 2019-02-10: 11:00:00 via INTRAVENOUS
  Filled 2019-02-10: qty 250

## 2019-02-10 MED ORDER — PHENAZOPYRIDINE HCL 200 MG PO TABS: 200.0000 mg | ORAL_TABLET | Freq: Three times a day (TID) | ORAL | 0 refills | Status: DC | PRN

## 2019-02-10 MED ORDER — DIPHENHYDRAMINE HCL 50 MG/ML IJ SOLN
50.0000 mg | Freq: Once | INTRAMUSCULAR | Status: AC
  Administered 2019-02-10: 50 mg via INTRAVENOUS
  Filled 2019-02-10: qty 1

## 2019-02-10 MED ORDER — SODIUM CHLORIDE 0.9 % IV SOLN
65.0000 mg/m2 | Freq: Once | INTRAVENOUS | Status: AC
  Administered 2019-02-10: 126 mg via INTRAVENOUS
  Filled 2019-02-10: qty 21

## 2019-02-10 MED ORDER — SODIUM CHLORIDE 0.9 % IV SOLN
Freq: Once | INTRAVENOUS | Status: AC
  Administered 2019-02-10: 12:00:00 via INTRAVENOUS
  Filled 2019-02-10: qty 1000

## 2019-02-10 NOTE — Telephone Encounter (Signed)
PT STATES that at least once a day that she has pain on urination. She does not feel that it is UTI and she is taking AZO. I spoke to Janese Banks and she states the pt. Still needs UA and urine cult. And I will send her in pyridium and told her that it will cause her urine to be orange and any clothing that her urine touches will dye it and it will not come off. She is aware and will take them med to see if it helps.

## 2019-02-11 LAB — URINE CULTURE: Culture: <10000 — AB

## 2019-02-13 ENCOUNTER — Other Ambulatory Visit: Payer: Self-pay | Admitting: General Surgery

## 2019-02-13 DIAGNOSIS — Z1231 Encounter for screening mammogram for malignant neoplasm of breast: Secondary | ICD-10-CM

## 2019-02-17 ENCOUNTER — Inpatient Hospital Stay: Payer: Medicare Other

## 2019-02-17 ENCOUNTER — Other Ambulatory Visit: Payer: Self-pay

## 2019-02-17 ENCOUNTER — Inpatient Hospital Stay (HOSPITAL_BASED_OUTPATIENT_CLINIC_OR_DEPARTMENT_OTHER): Payer: Medicare Other | Admitting: Oncology

## 2019-02-17 ENCOUNTER — Encounter: Payer: Self-pay | Admitting: Oncology

## 2019-02-17 VITALS — BP 134/66 | HR 98 | Temp 98.5°F | Resp 18 | Wt 180.0 lb

## 2019-02-17 DIAGNOSIS — Z170 Estrogen receptor positive status [ER+]: Secondary | ICD-10-CM

## 2019-02-17 DIAGNOSIS — E876 Hypokalemia: Secondary | ICD-10-CM

## 2019-02-17 DIAGNOSIS — C50411 Malignant neoplasm of upper-outer quadrant of right female breast: Secondary | ICD-10-CM | POA: Diagnosis not present

## 2019-02-17 DIAGNOSIS — Z780 Asymptomatic menopausal state: Secondary | ICD-10-CM

## 2019-02-17 DIAGNOSIS — Z9011 Acquired absence of right breast and nipple: Secondary | ICD-10-CM | POA: Diagnosis not present

## 2019-02-17 DIAGNOSIS — Z5111 Encounter for antineoplastic chemotherapy: Secondary | ICD-10-CM

## 2019-02-17 DIAGNOSIS — G620 Drug-induced polyneuropathy: Secondary | ICD-10-CM | POA: Diagnosis not present

## 2019-02-17 DIAGNOSIS — C50911 Malignant neoplasm of unspecified site of right female breast: Secondary | ICD-10-CM

## 2019-02-17 DIAGNOSIS — Z79899 Other long term (current) drug therapy: Secondary | ICD-10-CM | POA: Diagnosis not present

## 2019-02-17 DIAGNOSIS — Z95828 Presence of other vascular implants and grafts: Secondary | ICD-10-CM

## 2019-02-17 DIAGNOSIS — T451X5A Adverse effect of antineoplastic and immunosuppressive drugs, initial encounter: Secondary | ICD-10-CM | POA: Diagnosis not present

## 2019-02-17 LAB — COMPREHENSIVE METABOLIC PANEL
ALT: 20 U/L (ref 0–44)
AST: 20 U/L (ref 15–41)
Albumin: 3.5 g/dL (ref 3.5–5.0)
Alkaline Phosphatase: 100 U/L (ref 38–126)
Anion gap: 8 (ref 5–15)
BUN: 6 mg/dL — ABNORMAL LOW (ref 8–23)
CO2: 25 mmol/L (ref 22–32)
Calcium: 8.8 mg/dL — ABNORMAL LOW (ref 8.9–10.3)
Chloride: 105 mmol/L (ref 98–111)
Creatinine, Ser: 0.45 mg/dL (ref 0.44–1.00)
GFR calc Af Amer: >60 mL/min (ref >60)
GFR calc non Af Amer: >60 mL/min (ref >60)
Glucose, Bld: 119 mg/dL — ABNORMAL HIGH (ref 70–99)
Potassium: 3.1 mmol/L — ABNORMAL LOW (ref 3.5–5.1)
Sodium: 138 mmol/L (ref 135–145)
Total Bilirubin: 0.5 mg/dL (ref 0.3–1.2)
Total Protein: 6.3 g/dL — ABNORMAL LOW (ref 6.5–8.1)

## 2019-02-17 LAB — CBC WITH DIFFERENTIAL/PLATELET
Abs Immature Granulocytes: 0.06 10*3/uL (ref 0.00–0.07)
Basophils Absolute: 0 10*3/uL (ref 0.0–0.1)
Basophils Relative: 1 %
Eosinophils Absolute: 0 10*3/uL (ref 0.0–0.5)
Eosinophils Relative: 1 %
HCT: 35.8 % — ABNORMAL LOW (ref 36.0–46.0)
Hemoglobin: 11.7 g/dL — ABNORMAL LOW (ref 12.0–15.0)
Immature Granulocytes: 1 %
Lymphocytes Relative: 33 %
Lymphs Abs: 2.1 10*3/uL (ref 0.7–4.0)
MCH: 29.5 pg (ref 26.0–34.0)
MCHC: 32.7 g/dL (ref 30.0–36.0)
MCV: 90.2 fL (ref 80.0–100.0)
Monocytes Absolute: 0.6 10*3/uL (ref 0.1–1.0)
Monocytes Relative: 9 %
Neutro Abs: 3.5 10*3/uL (ref 1.7–7.7)
Neutrophils Relative %: 55 %
Platelets: 339 10*3/uL (ref 150–400)
RBC: 3.97 MIL/uL (ref 3.87–5.11)
RDW: 18.2 % — ABNORMAL HIGH (ref 11.5–15.5)
WBC: 6.2 10*3/uL (ref 4.0–10.5)
nRBC: 0 % (ref 0.0–0.2)

## 2019-02-17 MED ORDER — POTASSIUM CHLORIDE 20 MEQ/100ML IV SOLN
20.0000 meq | Freq: Once | INTRAVENOUS | Status: AC
  Administered 2019-02-17: 20 meq via INTRAVENOUS

## 2019-02-17 MED ORDER — SODIUM CHLORIDE 0.9 % IV SOLN
20.0000 mg | Freq: Once | INTRAVENOUS | Status: AC
  Administered 2019-02-17: 20 mg via INTRAVENOUS
  Filled 2019-02-17: qty 2

## 2019-02-17 MED ORDER — SODIUM CHLORIDE 0.9 % IV SOLN
65.0000 mg/m2 | Freq: Once | INTRAVENOUS | Status: AC
  Administered 2019-02-17: 126 mg via INTRAVENOUS
  Filled 2019-02-17: qty 21

## 2019-02-17 MED ORDER — HEPARIN SOD (PORK) LOCK FLUSH 100 UNIT/ML IV SOLN
500.0000 [IU] | Freq: Once | INTRAVENOUS | Status: AC | PRN
  Administered 2019-02-17: 500 [IU]
  Filled 2019-02-17: qty 5

## 2019-02-17 MED ORDER — DIPHENHYDRAMINE HCL 50 MG/ML IJ SOLN
50.0000 mg | Freq: Once | INTRAMUSCULAR | Status: AC
  Administered 2019-02-17: 50 mg via INTRAVENOUS
  Filled 2019-02-17: qty 1

## 2019-02-17 MED ORDER — FAMOTIDINE IN NACL 20-0.9 MG/50ML-% IV SOLN
20.0000 mg | Freq: Once | INTRAVENOUS | Status: AC
  Administered 2019-02-17: 20 mg via INTRAVENOUS
  Filled 2019-02-17: qty 50

## 2019-02-17 MED ORDER — SODIUM CHLORIDE 0.9 % IV SOLN
Freq: Once | INTRAVENOUS | Status: AC
  Administered 2019-02-17: 10:00:00 via INTRAVENOUS
  Filled 2019-02-17: qty 250

## 2019-02-17 MED ORDER — SODIUM CHLORIDE 0.9 % IV SOLN
Freq: Once | INTRAVENOUS | Status: DC
  Filled 2019-02-17: qty 100

## 2019-02-17 MED ORDER — SODIUM CHLORIDE 0.9% FLUSH
10.0000 mL | Freq: Once | INTRAVENOUS | Status: AC
  Administered 2019-02-17: 10 mL via INTRAVENOUS
  Filled 2019-02-17: qty 10

## 2019-02-17 NOTE — Progress Notes (Signed)
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 Patient underwent lumpectomy and sentinel lymph node biopsy in February 2020. Pathology showed invasive lobular carcinoma with positive medial margin which was reexcised with still positive. Tumor was 24 mm, grade 2 ER PR positive and HER-2/neu negative. One sentinel lymph node that was excised was positive for macro met of 8 mm. No extranodal extension was present. MammaPrint came back as high risk with an average 10-year risk of untreated disease is 29%. Patient then underwent a bilateral MRI which showed additional suspicious non-mass enhancement along the inferior margin of the biopsy cavity extending 2 cm highly suspicious for continuous disease and another highly suspicious linear non-mass enhancement at the base of the nipple concerning for multifocal multicentric disease. Patient underwent right mastectomy and axillary lymph node dissectionon 09/20/2018. There was no residual invasive mammary carcinoma within the breast. However 15 out of 20 lymph nodes were involved with metastatic carcinoma measuring up to 6 mm and remaining 5  lymph nodes with isolated tumor cells. Pathologic stage was [id number]  Despite having surgery in May 2020 which was a second surgery-patient has not been able to start adjuvant chemotherapy. Her mastectomy was complicated by flap necrosis requiring debridement antibiotics and she had to be taken back to the OR for the same. Given her age and postmastectomy complications and delayed wound healing-weekly Taxol x12 cycles was chosen as the regimen for her adjuvant chemotherapy which was started on 12/02/2018   Interval history-tolerating chemotherapy well so far.  Denies any tingling numbness in her extremities.  She was seen by Dr. Tollie Pizza last week and patient states that she was okay to proceed with adjuvant radiation treatment upon completion of chemotherapy.  Feels fatigued denies other complaints  ECOG PS- 1 Pain scale- 0   Review of systems- Review of Systems  Constitutional: Positive for malaise/fatigue. Negative for chills, fever and weight loss.  HENT: Negative for congestion, ear discharge and nosebleeds.   Eyes: Negative for blurred vision.  Respiratory: Negative for cough, hemoptysis, sputum production, shortness of breath and wheezing.   Cardiovascular: Negative for chest pain, palpitations, orthopnea and claudication.  Gastrointestinal: Negative for abdominal pain, blood in stool, constipation, diarrhea, heartburn, melena, nausea and vomiting.  Genitourinary: Negative for dysuria, flank pain, frequency, hematuria and urgency.  Musculoskeletal: Negative for back pain, joint pain and myalgias.  Skin: Negative for rash.  Neurological: Negative for dizziness, tingling, focal weakness, seizures, weakness and headaches.  Endo/Heme/Allergies: Does not bruise/bleed easily.  Psychiatric/Behavioral: Negative for depression and suicidal ideas. The patient does not have insomnia.       No Known Allergies   Past Medical History:  Diagnosis Date   Arthritis    Breast cancer ([place])     Diverticulitis  Family history of breast cancer    GERD (gastroesophageal reflux disease)    Hyperlipidemia    Hypertension      Past Surgical History:  Procedure Laterality Date   BREAST BIOPSY Right 05/28/2018   [place] bx, INVASIVE MAMMARY CARCINOMA WITH LOBULAR FEATURES   BREAST LUMPECTOMY Right 06/12/2018   [place], lobular features   BREAST LUMPECTOMY WITH SENTINEL LYMPH NODE BIOPSY Right 06/12/2018   Procedure: RIGHT BREAST WIDE EXCISION WITH SENTINEL LYMPH NODE BX;  Surgeon: Robert Bellow, MD;  Location: [facility] ORS;  Service: General;  Laterality: Right;   [place] AND DRAINAGE OF WOUND Right 10/30/2018   Procedure: IRRIGATION AND DEBRIDEMENT RIGHT CHEST WALL WOUND;  Surgeon: Robert Bellow, MD;  Location: [facility] ORS;  Service: General;  Laterality: Right;   JOINT REPLACEMENT     MASTECTOMY WITH AXILLARY LYMPH NODE DISSECTION Right 09/20/2018   Procedure: MASTECTOMY WITH AXILLARY LYMPH NODE DISSECTION RIGHT;  Surgeon: Robert Bellow, MD;  Location: [facility] ORS;  Service: General;  Laterality: Right;   ovaraian cyst removal Right    Cyst only (NOT OVARY)   PORTACATH PLACEMENT Left 09/20/2018   Procedure: INSERTION PORT-A-CATH, LEFT;  Surgeon: Robert Bellow, MD;  Location: [facility] ORS;  Service: General;  Laterality: Left;   TOTAL KNEE ARTHROPLASTY Bilateral 05/05/2010    Social History   Socioeconomic History   Marital status: Single    Spouse name: Not on file   Number of children: Not on file   Years of education: Not on file   Highest education level: Not on file  Occupational History   Occupation: retired  Scientist, product/process development strain: Not hard at all   Food insecurity    Worry: Never true    Inability: Never true   Transportation needs    Medical: No    Non-medical: No  Tobacco Use   Smoking status: Never Smoker   Smokeless tobacco: Never Used  Substance and Sexual  Activity   Alcohol use: No    Alcohol/week: 0.0 standard drinks   Drug use: No   Sexual activity: Never  Lifestyle   Physical activity    Days per week: Not on file    Minutes per session: Not on file   Stress: Not at all  Relationships   Social connections    Talks on phone: Not on file    Gets together: Not on file    Attends religious service: Not on file    Active member of club or organization: Not on file    Attends meetings of clubs or organizations: Not on file    Relationship status: Not on file   Intimate partner violence    Fear of current or ex partner: Not on file    Emotionally abused: Not on file    Physically abused: Not on file    Forced sexual activity: Not on file  Other Topics Concern   Not on file  Social History Narrative   Retired    Lives by herself    Pets: None   Caffeine- Coffee 2 cups daily, no tea/soda       Family History  Problem Relation Age of Onset   Alcohol abuse Mother        deceased 80   Diabetes Father    Breast cancer Maternal Aunt        dx 2s; deceased 57s   Breast cancer Maternal Aunt  dx 72s; deceased 47s     Current Outpatient Medications:    amLODipine (NORVASC) 5 MG tablet, TAKE 1 TABLET BY MOUTH  DAILY, Disp: 90 tablet, Rfl: 1   atorvastatin (LIPITOR) 80 MG tablet, TAKE 1 TABLET BY MOUTH  DAILY, Disp: 90 tablet, Rfl: 1   CALCIUM-VITAMIN D PO, Take 1 tablet by mouth daily. , Disp: , Rfl:    CRANBERRY PO, Take 1 tablet by mouth 2 (two) times daily. , Disp: , Rfl:    Multiple Vitamins tablet, Take 1 tablet by mouth daily. , Disp: , Rfl:    Omega-3 Fatty Acids (FISH OIL PO), Take 1 capsule by mouth daily. , Disp: , Rfl:    oxybutynin (DITROPAN) 5 MG tablet, TAKE 1 TABLET BY MOUTH TWO  TIMES DAILY, Disp: 180 tablet, Rfl: 1   pantoprazole (PROTONIX) 40 MG tablet, TAKE 1 TABLET BY MOUTH  DAILY, Disp: 90 tablet, Rfl: 1   torsemide (DEMADEX) 5 MG tablet, TAKE 1 TABLET BY MOUTH  DAILY, Disp: 90  tablet, Rfl: 1   Turmeric 500 MG CAPS, Take 1,000 mg by mouth 2 (two) times daily. , Disp: , Rfl:    phenazopyridine (PYRIDIUM) 200 MG tablet, Take 1 tablet (200 mg total) by mouth 3 (three) times daily as needed for pain. (Patient not taking: Reported on 02/17/2019), Disp: 30 tablet, Rfl: 0   potassium chloride (K-DUR) 10 MEQ tablet, Take 2 tablets (20 mEq total) by mouth daily for 14 days., Disp: 28 tablet, Rfl: 0  Current Facility-Administered Medications:    piperacillin-tazobactam (ZOSYN) IVPB 3.375 g, 3.375 g, Intravenous, Once, Byrnett, Forest Gleason, MD  Facility-Administered Medications Ordered in Other Visits:    dexamethasone (DECADRON) 20 mg in sodium chloride 0.9 % 50 mL IVPB, 20 mg, Intravenous, Once, Sindy Guadeloupe, MD   diphenhydrAMINE (BENADRYL) injection 50 mg, 50 mg, Intravenous, Once, Sindy Guadeloupe, MD   famotidine (PEPCID) IVPB 20 mg premix, 20 mg, Intravenous, Once, Sindy Guadeloupe, MD   heparin lock flush 100 unit/mL, 500 Units, Intracatheter, Once PRN, Sindy Guadeloupe, MD   PACLitaxel (TAXOL) 126 mg in sodium chloride 0.9 % 250 mL chemo infusion (</= 50m/m2), 65 mg/m2 (Treatment Plan Recorded), Intravenous, Once, RSindy Guadeloupe MD   potassium chloride 20 mEq in 100 mL IVPB, 20 mEq, Intravenous, Once, RSindy Guadeloupe MD  Physical exam:  Vitals:   02/17/19 0951  BP: 134/66  Pulse: 98  Resp: 18  Temp: 98.5 F (36.9 C)  TempSrc: Tympanic  Weight: 180 lb (81.6 kg)   Physical Exam Constitutional:      Comments: She ambulates with a cane.  Sleeve in place over the left upper extremity  HENT:     Head: Normocephalic and atraumatic.  Eyes:     Pupils: Pupils are equal, round, and reactive to light.  Neck:     Musculoskeletal: Normal range of motion.  Cardiovascular:     Rate and Rhythm: Normal rate and regular rhythm.     Heart sounds: Normal heart sounds.  Pulmonary:     Effort: Pulmonary effort is normal.     Breath sounds: Normal breath sounds.   Abdominal:     General: Bowel sounds are normal.     Palpations: Abdomen is soft.  Skin:    General: Skin is warm and dry.  Neurological:     Mental Status: She is alert and oriented to person, place, and time.      CMP Latest Ref Rng & Units 02/17/2019  Glucose 70 - 99 mg/dL 119(H)  BUN 8 - 23 mg/dL 6(L)  Creatinine 0.44 - 1.00 mg/dL 0.45  Sodium 135 - 145 mmol/L 138  Potassium 3.5 - 5.1 mmol/L 3.1(L)  Chloride 98 - 111 mmol/L 105  CO2 22 - 32 mmol/L 25  Calcium 8.9 - 10.3 mg/dL 8.8(L)  Total Protein 6.5 - 8.1 g/dL 6.3(L)  Total Bilirubin 0.3 - 1.2 mg/dL 0.5  Alkaline Phos 38 - 126 U/L 100  AST 15 - 41 U/L 20  ALT 0 - 44 U/L 20   CBC Latest Ref Rng & Units 02/17/2019  WBC 4.0 - 10.5 K/uL 6.2  Hemoglobin 12.0 - 15.0 g/dL 11.7(L)  Hematocrit 36.0 - 46.0 % 35.8(L)  Platelets 150 - 400 K/uL 339    Assessment and plan- Patient is a 78 y.o. female with invasive mammary carcinoma of the right breast pathological prognostic stage III ApT2pN3a cNX ER PR positive HER-2/neu negative status post right mastectomy and axillary lymph node dissection.she is here for on treatment assessment prior to cycle 11 of weekly taxol chemotherapy  Counts ok to proceed with cycle 11 of weekly taxol chemotherapy today. She will proceed with cycle 12 chemo next week. That would be her last chemo.  Per Dr. Bary Castilla- ok for patient to get opinion regarding adjuvant RT. I will refer patient with Dr. Baruch Gouty for this.  Given that her tumor was ER PR positive hormone therapy is indicated at this time.  Idiscussed the role for hormone therapy. Given that she is postmenopausal I would favor 5- 10 years of adjuvant hormone therapy with aromatase inhibitor. I discussed the risks and benefits of Arimidex including all but not limited to fatigue, hypercholesterolemia, hot flashes, arthralgias and worsening bone health.  Patient will also need to be on calcium 1200 mg along with vitamin D 800 international units.   We will obtain a baseline bone density scan written information about Arimidex given to the patient. I would like her to finish radiation therapy and start hormone therapy thereafter.   I will see her back in 3 weeks to discuss bone denisty scan and further choose hormone therapy options.  She would also be a candidate for adjuvant bisphosphonates given her high risk breast cancer. Discussed risks and benefits of zometa including all but not limited to fatigue, hypocalcemia and ONJ. She will need dental clearance prior to starting zometa. She will schedule a dental appointment  Hypokalemia- will increase po dose to 40 and give her 20 meq IV today     Visit Diagnosis 1. Encounter for antineoplastic chemotherapy   2. Malignant neoplasm of upper-outer quadrant of right breast in female, estrogen receptor positive ([place])   3. Post-menopausal   4. Hypokalemia      Dr. Randa Evens, MD, MPH [facility] [phone] 02/17/2019 1:05 PM

## 2019-02-21 ENCOUNTER — Other Ambulatory Visit: Payer: Self-pay

## 2019-02-24 ENCOUNTER — Inpatient Hospital Stay: Payer: Medicare Other | Attending: Oncology

## 2019-02-24 ENCOUNTER — Inpatient Hospital Stay: Payer: Medicare Other

## 2019-02-24 ENCOUNTER — Other Ambulatory Visit: Payer: Self-pay

## 2019-02-24 VITALS — BP 142/79 | HR 87 | Temp 99.0°F | Resp 18

## 2019-02-24 DIAGNOSIS — Z170 Estrogen receptor positive status [ER+]: Secondary | ICD-10-CM | POA: Insufficient documentation

## 2019-02-24 DIAGNOSIS — Z95828 Presence of other vascular implants and grafts: Secondary | ICD-10-CM

## 2019-02-24 DIAGNOSIS — Z79899 Other long term (current) drug therapy: Secondary | ICD-10-CM | POA: Diagnosis not present

## 2019-02-24 DIAGNOSIS — Z5111 Encounter for antineoplastic chemotherapy: Secondary | ICD-10-CM | POA: Diagnosis not present

## 2019-02-24 DIAGNOSIS — C50411 Malignant neoplasm of upper-outer quadrant of right female breast: Secondary | ICD-10-CM | POA: Diagnosis not present

## 2019-02-24 DIAGNOSIS — E876 Hypokalemia: Secondary | ICD-10-CM | POA: Insufficient documentation

## 2019-02-24 DIAGNOSIS — C50911 Malignant neoplasm of unspecified site of right female breast: Secondary | ICD-10-CM

## 2019-02-24 DIAGNOSIS — Z9011 Acquired absence of right breast and nipple: Secondary | ICD-10-CM | POA: Diagnosis not present

## 2019-02-24 LAB — COMPREHENSIVE METABOLIC PANEL
ALT: 21 U/L (ref 0–44)
AST: 22 U/L (ref 15–41)
Albumin: 3.5 g/dL (ref 3.5–5.0)
Alkaline Phosphatase: 100 U/L (ref 38–126)
Anion gap: 11 (ref 5–15)
BUN: 7 mg/dL — ABNORMAL LOW (ref 8–23)
CO2: 26 mmol/L (ref 22–32)
Calcium: 8.7 mg/dL — ABNORMAL LOW (ref 8.9–10.3)
Chloride: 102 mmol/L (ref 98–111)
Creatinine, Ser: 0.54 mg/dL (ref 0.44–1.00)
GFR calc Af Amer: >60 mL/min (ref >60)
GFR calc non Af Amer: >60 mL/min (ref >60)
Glucose, Bld: 122 mg/dL — ABNORMAL HIGH (ref 70–99)
Potassium: 3.3 mmol/L — ABNORMAL LOW (ref 3.5–5.1)
Sodium: 139 mmol/L (ref 135–145)
Total Bilirubin: 0.5 mg/dL (ref 0.3–1.2)
Total Protein: 6.3 g/dL — ABNORMAL LOW (ref 6.5–8.1)

## 2019-02-24 LAB — CBC WITH DIFFERENTIAL/PLATELET
Abs Immature Granulocytes: 0.05 10*3/uL (ref 0.00–0.07)
Basophils Absolute: 0 10*3/uL (ref 0.0–0.1)
Basophils Relative: 1 %
Eosinophils Absolute: 0 10*3/uL (ref 0.0–0.5)
Eosinophils Relative: 1 %
HCT: 34.7 % — ABNORMAL LOW (ref 36.0–46.0)
Hemoglobin: 11.4 g/dL — ABNORMAL LOW (ref 12.0–15.0)
Immature Granulocytes: 1 %
Lymphocytes Relative: 27 %
Lymphs Abs: 1.6 10*3/uL (ref 0.7–4.0)
MCH: 29.6 pg (ref 26.0–34.0)
MCHC: 32.9 g/dL (ref 30.0–36.0)
MCV: 90.1 fL (ref 80.0–100.0)
Monocytes Absolute: 0.7 10*3/uL (ref 0.1–1.0)
Monocytes Relative: 11 %
Neutro Abs: 3.4 10*3/uL (ref 1.7–7.7)
Neutrophils Relative %: 59 %
Platelets: 368 10*3/uL (ref 150–400)
RBC: 3.85 MIL/uL — ABNORMAL LOW (ref 3.87–5.11)
RDW: 18.5 % — ABNORMAL HIGH (ref 11.5–15.5)
WBC: 5.7 10*3/uL (ref 4.0–10.5)
nRBC: 0 % (ref 0.0–0.2)

## 2019-02-24 MED ORDER — SODIUM CHLORIDE 0.9% FLUSH
10.0000 mL | Freq: Once | INTRAVENOUS | Status: AC
  Administered 2019-02-24: 10 mL via INTRAVENOUS
  Filled 2019-02-24: qty 10

## 2019-02-24 MED ORDER — HEPARIN SOD (PORK) LOCK FLUSH 100 UNIT/ML IV SOLN
500.0000 [IU] | Freq: Once | INTRAVENOUS | Status: AC | PRN
  Administered 2019-02-24: 500 [IU]
  Filled 2019-02-24: qty 5

## 2019-02-24 MED ORDER — SODIUM CHLORIDE 0.9 % IV SOLN
65.0000 mg/m2 | Freq: Once | INTRAVENOUS | Status: AC
  Administered 2019-02-24: 126 mg via INTRAVENOUS
  Filled 2019-02-24: qty 21

## 2019-02-24 MED ORDER — SODIUM CHLORIDE 0.9 % IV SOLN
Freq: Once | INTRAVENOUS | Status: AC
  Administered 2019-02-24: 14:00:00 via INTRAVENOUS
  Filled 2019-02-24: qty 250

## 2019-02-24 MED ORDER — SODIUM CHLORIDE 0.9 % IV SOLN
Freq: Once | INTRAVENOUS | Status: DC
  Filled 2019-02-24: qty 100

## 2019-02-24 MED ORDER — FAMOTIDINE IN NACL 20-0.9 MG/50ML-% IV SOLN
20.0000 mg | Freq: Once | INTRAVENOUS | Status: AC
  Administered 2019-02-24: 20 mg via INTRAVENOUS
  Filled 2019-02-24: qty 50

## 2019-02-24 MED ORDER — POTASSIUM CHLORIDE 20 MEQ/100ML IV SOLN
20.0000 meq | Freq: Once | INTRAVENOUS | Status: AC
  Administered 2019-02-24: 20 meq via INTRAVENOUS

## 2019-02-24 MED ORDER — SODIUM CHLORIDE 0.9 % IV SOLN
20.0000 mg | Freq: Once | INTRAVENOUS | Status: AC
  Administered 2019-02-24: 20 mg via INTRAVENOUS
  Filled 2019-02-24: qty 2

## 2019-02-24 MED ORDER — DIPHENHYDRAMINE HCL 50 MG/ML IJ SOLN
50.0000 mg | Freq: Once | INTRAMUSCULAR | Status: AC
  Administered 2019-02-24: 50 mg via INTRAVENOUS
  Filled 2019-02-24: qty 1

## 2019-02-27 ENCOUNTER — Ambulatory Visit
Admission: RE | Admit: 2019-02-27 | Discharge: 2019-02-27 | Disposition: A | Discharge status: Home / Self Care | Payer: Medicare Other | Source: Ambulatory Visit | Attending: Oncology | Admitting: Oncology

## 2019-02-27 DIAGNOSIS — Z170 Estrogen receptor positive status [ER+]: Secondary | ICD-10-CM | POA: Diagnosis not present

## 2019-02-27 DIAGNOSIS — Z5111 Encounter for antineoplastic chemotherapy: Secondary | ICD-10-CM | POA: Diagnosis not present

## 2019-02-27 DIAGNOSIS — Z780 Asymptomatic menopausal state: Secondary | ICD-10-CM | POA: Diagnosis not present

## 2019-02-27 DIAGNOSIS — Z13820 Encounter for screening for osteoporosis: Secondary | ICD-10-CM | POA: Diagnosis not present

## 2019-02-27 DIAGNOSIS — C50411 Malignant neoplasm of upper-outer quadrant of right female breast: Secondary | ICD-10-CM | POA: Diagnosis not present

## 2019-02-28 ENCOUNTER — Telehealth: Payer: Self-pay | Admitting: *Deleted

## 2019-02-28 ENCOUNTER — Other Ambulatory Visit: Payer: Self-pay | Admitting: *Deleted

## 2019-02-28 ENCOUNTER — Encounter: Payer: Self-pay | Admitting: Oncology

## 2019-02-28 ENCOUNTER — Encounter: Payer: Self-pay | Admitting: *Deleted

## 2019-02-28 DIAGNOSIS — E876 Hypokalemia: Secondary | ICD-10-CM

## 2019-02-28 DIAGNOSIS — T502X5A Adverse effect of carbonic-anhydrase inhibitors, benzothiadiazides and other diuretics, initial encounter: Secondary | ICD-10-CM

## 2019-02-28 MED ORDER — POTASSIUM CHLORIDE CRYS ER 10 MEQ PO TBCR: 20.0000 meq | EXTENDED_RELEASE_TABLET | Freq: Two times a day (BID) | ORAL | 0 refills | Status: DC

## 2019-02-28 NOTE — Telephone Encounter (Signed)
I called the pt and she told me that she thought she was suppose to cont. The potassium but her mail in rx has suspended the rx from PCP stating she does not need it any longer. She just got potassium IV in the cancer center this week. She needs to take to potassium orally. I have permission to send rx to her local pharmacy 4o meq daily. 14 days. Will need to get lab encounter in 1 month. Pt agreeable. I will make appt and send rx to pharmacy

## 2019-03-04 ENCOUNTER — Ambulatory Visit: Payer: Medicare Other | Admitting: Occupational Therapy

## 2019-03-07 ENCOUNTER — Other Ambulatory Visit: Payer: Self-pay

## 2019-03-07 ENCOUNTER — Encounter: Payer: Self-pay | Admitting: Oncology

## 2019-03-07 NOTE — Progress Notes (Signed)
Patient pre screened for office appointment, no questions or concerns today. Patient reminded of upcoming appointment time and date. Patient has finished ABT but it could not be discontinued without a co-signer.

## 2019-03-10 ENCOUNTER — Other Ambulatory Visit: Payer: Self-pay

## 2019-03-10 ENCOUNTER — Ambulatory Visit
Admission: RE | Admit: 2019-03-10 | Discharge: 2019-03-10 | Disposition: A | Discharge status: Home / Self Care | Payer: Medicare Other | Source: Ambulatory Visit | Attending: Radiation Oncology | Admitting: Radiation Oncology

## 2019-03-10 ENCOUNTER — Inpatient Hospital Stay (HOSPITAL_BASED_OUTPATIENT_CLINIC_OR_DEPARTMENT_OTHER): Payer: Medicare Other | Admitting: Oncology

## 2019-03-10 VITALS — BP 121/57 | HR 103 | Temp 98.7°F | Resp 16 | Ht 65.0 in | Wt 175.4 lb

## 2019-03-10 DIAGNOSIS — R5383 Other fatigue: Secondary | ICD-10-CM

## 2019-03-10 DIAGNOSIS — C773 Secondary and unspecified malignant neoplasm of axilla and upper limb lymph nodes: Secondary | ICD-10-CM | POA: Diagnosis not present

## 2019-03-10 DIAGNOSIS — Z9011 Acquired absence of right breast and nipple: Secondary | ICD-10-CM | POA: Diagnosis not present

## 2019-03-10 DIAGNOSIS — C50911 Malignant neoplasm of unspecified site of right female breast: Secondary | ICD-10-CM | POA: Diagnosis not present

## 2019-03-10 DIAGNOSIS — Z5111 Encounter for antineoplastic chemotherapy: Secondary | ICD-10-CM | POA: Diagnosis not present

## 2019-03-10 DIAGNOSIS — C50411 Malignant neoplasm of upper-outer quadrant of right female breast: Secondary | ICD-10-CM | POA: Diagnosis not present

## 2019-03-10 DIAGNOSIS — Z170 Estrogen receptor positive status [ER+]: Secondary | ICD-10-CM

## 2019-03-10 DIAGNOSIS — Z79899 Other long term (current) drug therapy: Secondary | ICD-10-CM | POA: Diagnosis not present

## 2019-03-10 DIAGNOSIS — E876 Hypokalemia: Secondary | ICD-10-CM | POA: Diagnosis not present

## 2019-03-10 NOTE — Progress Notes (Signed)
Pt had low grade temp 100.3 tympanice, oral 98.7 oral. She has some white to clear sputum and her sinuses are bothering her for about a week. She moved out of her house for 1 day because of a rat and stayed with her daughter and she says it was so cold in there that she froze all night

## 2019-03-10 NOTE — Consult Note (Signed)
NEW PATIENT EVALUATION  Name: Theresa Reid  MRN: [id number]  Date:   03/10/2019     DOB: 1940/07/18   This 78 y.o. female patient presents to the clinic for initial evaluation of stage IIIa invasive mammary carcinoma the right breast (T2 N3 aM0) ER/PR positive HER-2 negative status post mastectomy axillary node dissection as well as weekly Taxol.  REFERRING PHYSICIAN: Leone Haven, MD  CHIEF COMPLAINT:  Chief Complaint  Patient presents with  . Breast Cancer    DIAGNOSIS: The encounter diagnosis was Malignant neoplasm of right breast in female, estrogen receptor positive, unspecified site of breast ([place]).   PREVIOUS INVESTIGATIONS:  Pathology report reviewed MRI scans mammograms and ultrasound reviewed Clinical notes reviewed  HPI: Patient is a 78 year old female who initially presented in January 2020 on screening mammogram which showed a proximally 1 cm mass in the right breast with normal-appearing axilla by ultrasound.  Initial biopsy showed a 4 mm grade 1 ER/PR positive and HER-2 negative invasive mammary carcinoma.  Patient underwent mammogram back in early February with ultrasound-guided core biopsy showing 4 mm invasive mammary carcinoma with lobular features.  This prompted wide local excision for a 2.4 cm overall grade 2 invasive lobular carcinoma with 1 lymph nodes showing macro metastatic involvement.  Margins were positive for invasive mammary carcinoma.  Macro metastatic lymph node deposit was 8 mm.  MammaPrint came back showing high risk with average 10-year risk of untreated disease at 29% chance of recurrence.  Bilateral MRI scans showed additional suspicious non-mass enhancement along the inferior margin of the biopsy cavity extending 2 cm highly suspicious for continuous disease.  She underwentr ight modified radical mastectomy and axillary dissection showing no residual disease in the breast.  15 of 20 lymph nodes were positive for metastatic disease measuring  up to 6 mm in greatest dimension with remaining 5 lymph node showing isolated tumor cells.  This was a pathologic stage T2N3A.  Her mastectomy was complicated by flap necrosis requiring debridement antibiotics and had return to the OR for debridement.  She then underwent 12 cycles of Taxol started in August 2020 which she is tolerated fairly well.  She is already been evaluated by lymphedema clinic and is wearing a sleeve at this time with the patient describes as minor swelling in her right upper extremity.  She otherwise specifically denies cough bone pain.  Bone scan back in June showed no evidence to suggest metastatic disease.  She is now referred to radiation oncology for consideration of adjuvant radiation therapy.  PLANNED TREATMENT REGIMEN: Right axillary and supraclavicular radiation  PAST MEDICAL HISTORY:  has a past medical history of Arthritis, Breast cancer ([place]), Diverticulitis, Family history of breast cancer, GERD (gastroesophageal reflux disease), Hyperlipidemia, and Hypertension.    PAST SURGICAL HISTORY:  Past Surgical History:  Procedure Laterality Date  . BREAST BIOPSY Right 05/28/2018   [place] b[place], INVASIVE MAMMARY CARCINOMA WITH LOBULAR FEATURES  . BREAST LUMPECTOMY Right 06/12/2018   [facility], lobular features  . BREAST LUMPECTOMY WITH SENTINEL LYMPH NODE BIOPSY Right 06/12/2018   Procedure: RIGHT BREAST WIDE EXCISION WITH SENTINEL LYMPH NODE BX;  Surgeon: Robert Bellow, MD;  Location: [facility] ORS;  Service: General;  Laterality: Right;  . CHOLECYSTECTOMY    . [place]  . INCISION AND DRAINAGE OF WOUND Right 10/30/2018   Procedure: IRRIGATION AND DEBRIDEMENT RIGHT CHEST WALL WOUND;  Surgeon: Robert Bellow, MD;  Location: [facility] ORS;  Service: General;  Laterality: Right;  . JOINT REPLACEMENT    .  MASTECTOMY WITH AXILLARY LYMPH NODE DISSECTION Right 09/20/2018   Procedure: MASTECTOMY WITH AXILLARY LYMPH NODE DISSECTION RIGHT;  Surgeon: Robert Bellow, MD;   Location: [facility] ORS;  Service: General;  Laterality: Right;  . ovaraian cyst removal Right    Cyst only (NOT OVARY)  . PORTACATH PLACEMENT Left 09/20/2018   Procedure: INSERTION PORT-A-CATH, LEFT;  Surgeon: Robert Bellow, MD;  Location: [facility] ORS;  Service: General;  Laterality: Left;  . TOTAL KNEE ARTHROPLASTY Bilateral 05/05/2010    FAMILY HISTORY: family history includes Alcohol abuse in her mother; Breast cancer in her maternal aunt and maternal aunt; Diabetes in her father.  SOCIAL HISTORY:  reports that she has never smoked. She has never used smokeless tobacco. She reports that she does not drink alcohol or use drugs.  ALLERGIES: Patient has no known allergies.  MEDICATIONS:  Current Outpatient Medications  Medication Sig Dispense Refill  . amLODipine (NORVASC) 5 MG tablet TAKE 1 TABLET BY MOUTH  DAILY 90 tablet 1  . atorvastatin (LIPITOR) 80 MG tablet TAKE 1 TABLET BY MOUTH  DAILY 90 tablet 1  . CALCIUM-VITAMIN D PO Take 1 tablet by mouth daily.     Marland Kitchen CRANBERRY PO Take 1 tablet by mouth 2 (two) times daily.     . Multiple Vitamins tablet Take 1 tablet by mouth daily.     . Omega-3 Fatty Acids (FISH OIL PO) Take 1 capsule by mouth daily.     Marland Kitchen oxybutynin (DITROPAN) 5 MG tablet TAKE 1 TABLET BY MOUTH TWO  TIMES DAILY 180 tablet 1  . pantoprazole (PROTONIX) 40 MG tablet TAKE 1 TABLET BY MOUTH  DAILY 90 tablet 1  . phenazopyridine (PYRIDIUM) 200 MG tablet Take 1 tablet (200 mg total) by mouth 3 (three) times daily as needed for pain. 30 tablet 0  . potassium chloride (KLOR-CON) 10 MEQ tablet Take 2 tablets (20 mEq total) by mouth 2 (two) times daily for 28 days. 56 tablet 0  . torsemide (DEMADEX) 5 MG tablet TAKE 1 TABLET BY MOUTH  DAILY 90 tablet 1  . Turmeric 500 MG CAPS Take 1,000 mg by mouth 2 (two) times daily.      Current Facility-Administered Medications  Medication Dose Route Frequency Provider Last Rate Last Dose  . piperacillin-tazobactam (ZOSYN) IVPB 3.375 g  3.375 g  Intravenous Once Robert Bellow, MD        ECOG PERFORMANCE STATUS:  0 - Asymptomatic  REVIEW OF SYSTEMS: Patient denies any weight loss, fatigue, weakness, fever, chills or night sweats. Patient denies any loss of vision, blurred vision. Patient denies any ringing  of the ears or hearing loss. No irregular heartbeat. Patient denies heart murmur or history of fainting. Patient denies any chest pain or pain radiating to her upper extremities. Patient denies any shortness of breath, difficulty breathing at night, cough or hemoptysis. Patient denies any swelling in the lower legs. Patient denies any nausea vomiting, vomiting of blood, or coffee ground material in the vomitus. Patient denies any stomach pain. Patient states has had normal bowel movements no significant constipation or diarrhea. Patient denies any dysuria, hematuria or significant nocturia. Patient denies any problems walking, swelling in the joints or loss of balance. Patient denies any skin changes, loss of hair or loss of weight. Patient denies any excessive worrying or anxiety or significant depression. Patient denies any problems with insomnia. Patient denies excessive thirst, polyuria, polydipsia. Patient denies any swollen glands, patient denies easy bruising or easy bleeding. Patient denies any recent  infections, allergies or URI. Patient "s visual fields have not changed significantly in recent time.   PHYSICAL EXAM: There were no vitals taken for this visit. Patient is status post right modified radical mastectomy.  Chest wall is healed well at this time no mass or nodularity mastectomy scars noted left breast is free of dominant mass or nodularity in 2 positions examined.  No axillary or supraclavicular adenopathy is appreciated.  Well-developed well-nourished patient in NAD. HEENT reveals PERLA, EOMI, discs not visualized.  Oral cavity is clear. No oral mucosal lesions are identified. Neck is clear without evidence of cervical  or supraclavicular adenopathy. Lungs are clear to A&P. Cardiac examination is essentially unremarkable with regular rate and rhythm without murmur rub or thrill. Abdomen is benign with no organomegaly or masses noted. Motor sensory and DTR levels are equal and symmetric in the upper and lower extremities. Cranial nerves II through XII are grossly intact. Proprioception is intact. No peripheral adenopathy or edema is identified. No motor or sensory levels are noted. Crude visual fields are within normal range.  LABORATORY DATA: Pathology report reviewed    RADIOLOGY RESULTS: MRI scan CT scans mammograms and ultrasound all reviewed viewed and compatible with above-stated findings   IMPRESSION: Stage III lobular carcinoma of the right breast status post right modified radical mastectomy and axillary lymph node dissection followed by weekly Taxol in 78 year old female  PLAN: At this time I am concerned about the significant burden of tumor in her right axilla.  In view of the isolated tumor cells she certainly is at risk for supraclavicular recurrence in the future.  I have recommended radiation therapy to her right axilla.  I believe we can spare her chest wall since she is had a complete response at the time of mastectomy in the breast.  I would plan on delivering 5040 cGy in 28 fractions.  Risks and benefits of treatment including possible inclusion of superficial lung fatigue skin reaction and chance of lymphedema in her right upper extremity all were discussed in detail with the patient.  Patient comprehends my treatment plan well.  I have personally set up and ordered CT simulation for later this week.  I would like to take this opportunity to thank you for allowing me to participate in the care of your patient.Noreene Filbert, MD

## 2019-03-11 ENCOUNTER — Other Ambulatory Visit: Payer: Self-pay

## 2019-03-11 NOTE — Addendum Note (Signed)
Addended by: Randa Evens C on: 03/11/2019 08:26 AM   Modules accepted: Orders

## 2019-03-11 NOTE — Progress Notes (Signed)
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 Patient underwent lumpectomy and sentinel lymph node biopsy in February 2020. Pathology showed invasive lobular carcinoma with positive medial margin which was reexcised with still positive. Tumor was 24 mm, grade 2 ER PR positive and HER-2/neu negative. One sentinel lymph node that was excised was positive for macro met of 8 mm. No extranodal extension was present. MammaPrint came back as high risk with an average 10-year risk of untreated disease is 29%. Patient then underwent a bilateral MRI which showed additional suspicious non-mass enhancement along the inferior margin of the biopsy cavity extending 2 cm highly suspicious for continuous disease and another highly suspicious linear non-mass enhancement at the base of the nipple concerning for multifocal multicentric disease. Patient underwent right mastectomy and axillary lymph node dissectionon 09/20/2018. There was no residual invasive mammary carcinoma within the breast. However 15 out of 20 lymph nodes were involved with metastatic carcinoma  measuring up to 6 mm and remaining 5 lymph nodes with isolated tumor cells. Pathologic stage was [id number]  Despite having surgery in May 2020 which was a second surgery-patient has not been able to start adjuvant chemotherapy. Her mastectomy was complicated by flap necrosis requiring debridement antibiotics and she had to be taken back to the OR for the same. Given her age and postmastectomy complications and delayed wound healing-weekly Taxol x12 cycles was chosen as the regimen for her adjuvant chemotherapy which she completed on 02/25/2019.  Interval history-patient still feels significantly tired from her chemotherapy.  She will be starting radiation tentatively next week.  Denies any tingling numbness in her extremities.  ECOG PS- 1 Pain scale- 0   Review of systems- Review of Systems  Constitutional: Positive for malaise/fatigue. Negative for chills, fever and weight loss.  HENT: Negative for congestion, ear discharge and nosebleeds.   Eyes: Negative for blurred vision.  Respiratory: Negative for cough, hemoptysis, sputum production, shortness of breath and wheezing.   Cardiovascular: Negative for chest pain, palpitations, orthopnea and claudication.  Gastrointestinal: Negative for abdominal pain, blood in stool, constipation, diarrhea, heartburn, melena, nausea and vomiting.  Genitourinary: Negative for dysuria, flank pain, frequency, hematuria and urgency.  Musculoskeletal: Negative for back pain, joint pain and myalgias.  Skin: Negative for rash.  Neurological: Negative for dizziness, tingling, focal weakness, seizures, weakness and headaches.  Endo/Heme/Allergies: Does not bruise/bleed easily.  Psychiatric/Behavioral: Negative for depression and suicidal ideas. The patient does not have insomnia.       No Known Allergies   Past Medical History:  Diagnosis Date   Arthritis    Breast cancer ([place])    Diverticulitis    Family history of breast cancer    GERD  (gastroesophageal reflux disease)  Hyperlipidemia    Hypertension      Past Surgical History:  Procedure Laterality Date   BREAST BIOPSY Right 05/28/2018   [place] bx, INVASIVE MAMMARY CARCINOMA WITH LOBULAR FEATURES   BREAST LUMPECTOMY Right 06/12/2018   Power, lobular features   BREAST LUMPECTOMY WITH SENTINEL LYMPH NODE BIOPSY Right 06/12/2018   Procedure: RIGHT BREAST WIDE EXCISION WITH SENTINEL LYMPH NODE BX;  Surgeon: Robert Bellow, MD;  Location: [facility] ORS;  Service: General;  Laterality: Right;   [place] AND DRAINAGE OF WOUND Right 10/30/2018   Procedure: IRRIGATION AND DEBRIDEMENT RIGHT CHEST WALL WOUND;  Surgeon: Robert Bellow, MD;  Location: [facility] ORS;  Service: General;  Laterality: Right;   JOINT REPLACEMENT     MASTECTOMY WITH AXILLARY LYMPH NODE DISSECTION Right 09/20/2018   Procedure: MASTECTOMY WITH AXILLARY LYMPH NODE DISSECTION RIGHT;  Surgeon: Robert Bellow, MD;  Location: [facility] ORS;  Service: General;  Laterality: Right;   ovaraian cyst removal Right    Cyst only (NOT OVARY)   PORTACATH PLACEMENT Left 09/20/2018   Procedure: INSERTION PORT-A-CATH, LEFT;  Surgeon: Robert Bellow, MD;  Location: [facility] ORS;  Service: General;  Laterality: Left;   TOTAL KNEE ARTHROPLASTY Bilateral 05/05/2010    Social History   Socioeconomic History   Marital status: Single    Spouse name: Not on file   Number of children: Not on file   Years of education: Not on file   Highest education level: Not on file  Occupational History   Occupation: retired  Scientist, product/process development strain: Not hard at all   Food insecurity    Worry: Never true    Inability: Never true   Transportation needs    Medical: No    Non-medical: No  Tobacco Use   Smoking status: Never Smoker   Smokeless tobacco: Never Used  Substance and Sexual Activity   Alcohol use: No    Alcohol/week: 0.0 standard drinks    Drug use: No   Sexual activity: Never  Lifestyle   Physical activity    Days per week: Not on file    Minutes per session: Not on file   Stress: Not at all  Relationships   Social connections    Talks on phone: Not on file    Gets together: Not on file    Attends religious service: Not on file    Active member of club or organization: Not on file    Attends meetings of clubs or organizations: Not on file    Relationship status: Not on file   Intimate partner violence    Fear of current or ex partner: Not on file    Emotionally abused: Not on file    Physically abused: Not on file    Forced sexual activity: Not on file  Other Topics Concern   Not on file  Social History Narrative   Retired    Lives by herself    Pets: None   Caffeine- Coffee 2 cups daily, no tea/soda       Family History  Problem Relation Age of Onset   Alcohol abuse Mother        deceased 54   Diabetes Father    Breast cancer Maternal Aunt        dx 34s; deceased 40s   Breast cancer Maternal Aunt        dx 60s; deceased 58s  Current Outpatient Medications:    amLODipine (NORVASC) 5 MG tablet, TAKE 1 TABLET BY MOUTH  DAILY, Disp: 90 tablet, Rfl: 1   atorvastatin (LIPITOR) 80 MG tablet, TAKE 1 TABLET BY MOUTH  DAILY, Disp: 90 tablet, Rfl: 1   CALCIUM-VITAMIN D PO, Take 1 tablet by mouth daily. , Disp: , Rfl:    CRANBERRY PO, Take 1 tablet by mouth 2 (two) times daily. , Disp: , Rfl:    Multiple Vitamins tablet, Take 1 tablet by mouth daily. , Disp: , Rfl:    Omega-3 Fatty Acids (FISH OIL PO), Take 1 capsule by mouth daily. , Disp: , Rfl:    oxybutynin (DITROPAN) 5 MG tablet, TAKE 1 TABLET BY MOUTH TWO  TIMES DAILY, Disp: 180 tablet, Rfl: 1   pantoprazole (PROTONIX) 40 MG tablet, TAKE 1 TABLET BY MOUTH  DAILY, Disp: 90 tablet, Rfl: 1   phenazopyridine (PYRIDIUM) 200 MG tablet, Take 1 tablet (200 mg total) by mouth 3 (three) times daily as needed for pain., Disp: 30 tablet, Rfl:  0   potassium chloride (KLOR-CON) 10 MEQ tablet, Take 2 tablets (20 mEq total) by mouth 2 (two) times daily for 28 days., Disp: 56 tablet, Rfl: 0   torsemide (DEMADEX) 5 MG tablet, TAKE 1 TABLET BY MOUTH  DAILY, Disp: 90 tablet, Rfl: 1   Turmeric 500 MG CAPS, Take 1,000 mg by mouth 2 (two) times daily. , Disp: , Rfl:   Current Facility-Administered Medications:    piperacillin-tazobactam (ZOSYN) IVPB 3.375 g, 3.375 g, Intravenous, Once, Byrnett, Forest Gleason, MD  Physical exam:  Vitals:   03/10/19 0945 03/10/19 0954  BP: (!) 121/57   Pulse: (!) 103   Resp: 16   Temp:  98.7 F (37.1 C)  TempSrc:  Oral  Weight: 175 lb 6.4 oz (79.6 kg)   Height: _0  (1.651 m)    Physical Exam Constitutional:      Comments: Sleeve in place over right upper extremity  HENT:     Head: Normocephalic and atraumatic.  Eyes:     Pupils: Pupils are equal, round, and reactive to light.  Neck:     Musculoskeletal: Normal range of motion.  Cardiovascular:     Rate and Rhythm: Normal rate and regular rhythm.     Heart sounds: Normal heart sounds.  Pulmonary:     Effort: Pulmonary effort is normal.     Breath sounds: Normal breath sounds.  Abdominal:     General: Bowel sounds are normal.     Palpations: Abdomen is soft.  Skin:    General: Skin is warm and dry.  Neurological:     Mental Status: She is alert and oriented to person, place, and time.      CMP Latest Ref Rng & Units 02/24/2019  Glucose 70 - 99 mg/dL 122(H)  BUN 8 - 23 mg/dL 7(L)  Creatinine 0.44 - 1.00 mg/dL 0.54  Sodium 135 - 145 mmol/L 139  Potassium 3.5 - 5.1 mmol/L 3.3(L)  Chloride 98 - 111 mmol/L 102  CO2 22 - 32 mmol/L 26  Calcium 8.9 - 10.3 mg/dL 8.7(L)  Total Protein 6.5 - 8.1 g/dL 6.3(L)  Total Bilirubin 0.3 - 1.2 mg/dL 0.5  Alkaline Phos 38 - 126 U/L 100  AST 15 - 41 U/L 22  ALT 0 - 44 U/L 21   CBC Latest Ref Rng & Units 02/24/2019  WBC 4.0 - 10.5 K/uL 5.7  Hemoglobin 12.0 - 15.0 g/dL 11.4(L)  Hematocrit 36.0 -  46.0 %  34.7(L)  Platelets 150 - 400 K/uL 368    No images are attached to the encounter.  Dg Bone Density  Result Date: 02/27/2019 EXAM: DUAL X-RAY ABSORPTIOMETRY (DXA) FOR BONE MINERAL DENSITY IMPRESSION: Technologist: MTB Your patient Hennessy Bartel completed a BMD test on 02/27/2019 using the [place] (analysis version: 14.10) manufactured by [employer]. The following summarizes the results of our evaluation. PATIENT BIOGRAPHICAL: Name: Marveline, Profeta Patient ID: [id number] Birth Date: 1940-07-31 Height: 65.0 in. Gender: Female Exam Date: 02/27/2019 Weight: 180.0 lbs. Indications: Advanced Age, bilateral knee replacements, Breast CA, Caucasian, High Risk Meds, Osteoarthritis, Postmenopausal Fractures: Treatments: Calcium, Multi-Vitamin, Vitamin D ASSESSMENT: The BMD measured at Femur Neck Left is 0.925 g/cm2 with a T-score of -0.8. This patient is considered normal according to [facility]) criteria. The scan quality is good. Lumbar spine was not utilized due to advanced degenerative changes. Site Region Measured Measured WHO Young Adult BMD Date       Age      Classification T-score DualFemur Neck Left 02/27/2019 79.8 Normal -0.8 0.925 g/cm2 DualFemur Neck Left 11/08/2016 77.5 Normal -0.4 0.988 g/cm2 DualFemur Total Mean 02/27/2019 79.8 Normal -0.4 0.959 g/cm2 DualFemur Total Mean 11/08/2016 77.5 Normal 0.1 1.017 g/cm2 Left Forearm Radius 33% 02/27/2019 79.8 Normal -0.7 0.817 g/cm2 Left Forearm Radius 33% 11/08/2016 77.5 Normal -0.2 0.863 g/cm2 World Health Organization [facility]) criteria for post-menopausal, Caucasian Women: Normal:       T-score at or above -1 SD Osteopenia:   T-score between -1 and -2.5 SD Osteoporosis: T-score at or below -2.5 SD RECOMMENDATIONS: 1. All patients should optimize calcium and vitamin D intake. 2. Consider FDA-approved medical therapies in postmenopausal women and men aged 61 years and older, based on the following: a. A hip or  vertebral(clinical or morphometric) fracture b. T-score < -2.5 at the femoral neck or spine after appropriate evaluation to exclude secondary causes c. Low bone mass (T-score between -1.0 and -2.5 at the femoral neck or spine) and a 10-year probability of a hip fracture > 3% or a 10-year probability of a major osteoporosis-related fracture > 20% based on the [place]-adapted WHO algorithm d. Clinician judgment and/or patient preferences may indicate treatment for people with 10-year fracture probabilities above or below these levels FOLLOW-UP: People with diagnosed cases of osteoporosis or at high risk for fracture should have regular bone mineral density tests. For patients eligible for Medicare, routine testing is allowed once every 2 years. The testing frequency can be increased to one year for patients who have rapidly progressing disease, those who are receiving or discontinuing medical therapy to restore bone mass, or have additional risk factors. I have reviewed this report, and agree with the above findings. [facility] Radiology Electronically Signed   By: Lowella Grip III M.D.   On: 02/27/2019 10:10     Assessment and plan- Patient is a 78 y.o. female with invasive mammary carcinoma of the right breast pathological prognostic stage III [id number] cNX ER PR positive HER-2/neu negative status post right mastectomy and axillary lymph node dissection.  She completed 12 cycles of adjuvant weekly Taxol chemotherapy.  She is here for routine follow-up and to discuss results of bone density scan  1.  Chemotherapy-induced fatigue: Suspect that her ongoing fatigue is secondary to chemotherapy.  Expected to improve over the next few weeks.  Encourage patient to continue her daily physical activities and some mild exercise if possible.  2.  Patient is s/p mastectomy but had multiple lymph nodes  that were positive at the time of surgery.  She would therefore benefit from radiation to the axilla which she will  start next week.  3.  Given that patient's tumor is ER/PR positive he would benefit from adjuvant hormone therapy for 5 if not 10 years.  Her baseline bone density scan was normal.   Patie understands and is agreeable to  start taking Arimidex upon completion of radiation treatment.  She has been information about Arimidex.  4.  Patient would also benefit from adjuvant bisphosphonate Zometa every 6 months for 3 to 5 years.  She does have her dentist appointment coming up this week.  She will tentatively start her Zometa in 6 weeks   Visit Diagnosis 1. Malignant neoplasm of right breast in female, estrogen receptor positive, unspecified site of breast ([place])   2. Fatigue, unspecified type      Dr. Randa Evens, MD, MPH [facility] at [facility] [phone] 03/11/2019 8:19 AM

## 2019-03-12 ENCOUNTER — Other Ambulatory Visit: Payer: Self-pay

## 2019-03-12 ENCOUNTER — Ambulatory Visit
Admission: RE | Admit: 2019-03-12 | Discharge: 2019-03-12 | Disposition: A | Discharge status: Home / Self Care | Payer: Medicare Other | Source: Ambulatory Visit | Attending: Radiation Oncology | Admitting: Radiation Oncology

## 2019-03-12 ENCOUNTER — Encounter: Payer: Medicare Other | Admitting: Occupational Therapy

## 2019-03-12 DIAGNOSIS — C773 Secondary and unspecified malignant neoplasm of axilla and upper limb lymph nodes: Secondary | ICD-10-CM | POA: Diagnosis not present

## 2019-03-12 DIAGNOSIS — C50911 Malignant neoplasm of unspecified site of right female breast: Secondary | ICD-10-CM | POA: Insufficient documentation

## 2019-03-12 DIAGNOSIS — C50411 Malignant neoplasm of upper-outer quadrant of right female breast: Secondary | ICD-10-CM | POA: Diagnosis not present

## 2019-03-12 DIAGNOSIS — Z170 Estrogen receptor positive status [ER+]: Secondary | ICD-10-CM | POA: Diagnosis not present

## 2019-03-13 DIAGNOSIS — C50911 Malignant neoplasm of unspecified site of right female breast: Secondary | ICD-10-CM | POA: Diagnosis not present

## 2019-03-13 DIAGNOSIS — C50411 Malignant neoplasm of upper-outer quadrant of right female breast: Secondary | ICD-10-CM | POA: Diagnosis not present

## 2019-03-13 DIAGNOSIS — Z170 Estrogen receptor positive status [ER+]: Secondary | ICD-10-CM | POA: Diagnosis not present

## 2019-03-13 DIAGNOSIS — C773 Secondary and unspecified malignant neoplasm of axilla and upper limb lymph nodes: Secondary | ICD-10-CM | POA: Diagnosis not present

## 2019-03-19 ENCOUNTER — Other Ambulatory Visit: Payer: Self-pay | Admitting: Family Medicine

## 2019-03-19 DIAGNOSIS — R600 Localized edema: Secondary | ICD-10-CM

## 2019-03-24 ENCOUNTER — Telehealth: Payer: Self-pay

## 2019-03-24 ENCOUNTER — Other Ambulatory Visit: Payer: Self-pay

## 2019-03-24 ENCOUNTER — Ambulatory Visit
Admission: RE | Admit: 2019-03-24 | Discharge: 2019-03-24 | Disposition: A | Discharge status: Home / Self Care | Payer: Medicare Other | Source: Ambulatory Visit | Attending: Radiation Oncology | Admitting: Radiation Oncology

## 2019-03-24 DIAGNOSIS — C50911 Malignant neoplasm of unspecified site of right female breast: Secondary | ICD-10-CM | POA: Diagnosis not present

## 2019-03-24 DIAGNOSIS — C773 Secondary and unspecified malignant neoplasm of axilla and upper limb lymph nodes: Secondary | ICD-10-CM | POA: Diagnosis not present

## 2019-03-24 DIAGNOSIS — C50411 Malignant neoplasm of upper-outer quadrant of right female breast: Secondary | ICD-10-CM | POA: Diagnosis not present

## 2019-03-24 DIAGNOSIS — Z170 Estrogen receptor positive status [ER+]: Secondary | ICD-10-CM | POA: Diagnosis not present

## 2019-03-24 DIAGNOSIS — E785 Hyperlipidemia, unspecified: Secondary | ICD-10-CM

## 2019-03-24 NOTE — Telephone Encounter (Signed)
It's been over a year since she has been seen. She will need to have labs completed for her cholesterol prior to this being refilled. It can be refilled for a short term supply (60 tablets) once she is scheduled.

## 2019-03-25 ENCOUNTER — Other Ambulatory Visit: Payer: Self-pay

## 2019-03-25 ENCOUNTER — Ambulatory Visit
Admission: RE | Admit: 2019-03-25 | Discharge: 2019-03-25 | Disposition: A | Discharge status: Home / Self Care | Payer: Medicare Other | Source: Ambulatory Visit | Attending: Radiation Oncology | Admitting: Radiation Oncology

## 2019-03-25 ENCOUNTER — Telehealth: Payer: Self-pay | Admitting: *Deleted

## 2019-03-25 DIAGNOSIS — C773 Secondary and unspecified malignant neoplasm of axilla and upper limb lymph nodes: Secondary | ICD-10-CM | POA: Diagnosis not present

## 2019-03-25 DIAGNOSIS — Z170 Estrogen receptor positive status [ER+]: Secondary | ICD-10-CM | POA: Diagnosis not present

## 2019-03-25 DIAGNOSIS — C50911 Malignant neoplasm of unspecified site of right female breast: Secondary | ICD-10-CM | POA: Diagnosis not present

## 2019-03-25 DIAGNOSIS — C50411 Malignant neoplasm of upper-outer quadrant of right female breast: Secondary | ICD-10-CM | POA: Diagnosis not present

## 2019-03-25 DIAGNOSIS — E785 Hyperlipidemia, unspecified: Secondary | ICD-10-CM

## 2019-03-25 MED ORDER — ATORVASTATIN CALCIUM 80 MG PO TABS: 80.0000 mg | ORAL_TABLET | Freq: Every day | ORAL | 0 refills | Status: DC

## 2019-03-25 NOTE — Telephone Encounter (Signed)
Dr Ellen Henri office called asking if we could draw a lipid panel on this patient when she comes in for lab draw for [place] on Friday. If in agreement, please order it in our system.

## 2019-03-25 NOTE — Telephone Encounter (Signed)
That is fine with me.  Please contact the cancer center and see if they are willing to draw a lipid panel for her.  I have placed orders for this.  Thanks.

## 2019-03-25 NOTE — Telephone Encounter (Signed)
I called the [facility] and left a message for the triage to call back.  Carlon Davidson,cma

## 2019-03-25 NOTE — Telephone Encounter (Signed)
Patient is in radiation every day at the [facility], she states she has a lab appointment at the [place] [facility] on Friday @ 10:45 am and can you send the order there for her to have your labs drawn at the same time instead of her making a trip here.  Arisbel Maione,cma

## 2019-03-25 NOTE — Telephone Encounter (Signed)
Yes can you put it in?

## 2019-03-26 ENCOUNTER — Other Ambulatory Visit: Payer: Self-pay

## 2019-03-26 ENCOUNTER — Ambulatory Visit
Admission: RE | Admit: 2019-03-26 | Discharge: 2019-03-26 | Disposition: A | Discharge status: Home / Self Care | Payer: Medicare Other | Source: Ambulatory Visit | Attending: Radiation Oncology | Admitting: Radiation Oncology

## 2019-03-26 DIAGNOSIS — C773 Secondary and unspecified malignant neoplasm of axilla and upper limb lymph nodes: Secondary | ICD-10-CM | POA: Diagnosis not present

## 2019-03-26 DIAGNOSIS — C50411 Malignant neoplasm of upper-outer quadrant of right female breast: Secondary | ICD-10-CM | POA: Diagnosis not present

## 2019-03-26 DIAGNOSIS — Z170 Estrogen receptor positive status [ER+]: Secondary | ICD-10-CM | POA: Diagnosis not present

## 2019-03-26 DIAGNOSIS — C50911 Malignant neoplasm of unspecified site of right female breast: Secondary | ICD-10-CM | POA: Diagnosis not present

## 2019-03-26 NOTE — Telephone Encounter (Signed)
I called and spoke with a gentleman in the lab and he stated he sees the order and will draw the lipid panel when she comes in.  Nina,cma

## 2019-03-27 ENCOUNTER — Other Ambulatory Visit: Payer: Self-pay

## 2019-03-27 ENCOUNTER — Ambulatory Visit
Admission: RE | Admit: 2019-03-27 | Discharge: 2019-03-27 | Disposition: A | Discharge status: Home / Self Care | Payer: Medicare Other | Source: Ambulatory Visit | Attending: Radiation Oncology | Admitting: Radiation Oncology

## 2019-03-27 DIAGNOSIS — Z170 Estrogen receptor positive status [ER+]: Secondary | ICD-10-CM | POA: Diagnosis not present

## 2019-03-27 DIAGNOSIS — C50911 Malignant neoplasm of unspecified site of right female breast: Secondary | ICD-10-CM | POA: Diagnosis not present

## 2019-03-27 DIAGNOSIS — C50411 Malignant neoplasm of upper-outer quadrant of right female breast: Secondary | ICD-10-CM | POA: Diagnosis not present

## 2019-03-27 DIAGNOSIS — C773 Secondary and unspecified malignant neoplasm of axilla and upper limb lymph nodes: Secondary | ICD-10-CM | POA: Diagnosis not present

## 2019-03-28 ENCOUNTER — Ambulatory Visit
Admission: RE | Admit: 2019-03-28 | Discharge: 2019-03-28 | Disposition: A | Discharge status: Home / Self Care | Payer: Medicare Other | Source: Ambulatory Visit | Attending: Radiation Oncology | Admitting: Radiation Oncology

## 2019-03-28 ENCOUNTER — Other Ambulatory Visit: Payer: Self-pay

## 2019-03-28 ENCOUNTER — Inpatient Hospital Stay: Payer: Medicare Other | Attending: Oncology

## 2019-03-28 DIAGNOSIS — T451X5A Adverse effect of antineoplastic and immunosuppressive drugs, initial encounter: Secondary | ICD-10-CM | POA: Insufficient documentation

## 2019-03-28 DIAGNOSIS — C50911 Malignant neoplasm of unspecified site of right female breast: Secondary | ICD-10-CM | POA: Insufficient documentation

## 2019-03-28 DIAGNOSIS — G620 Drug-induced polyneuropathy: Secondary | ICD-10-CM | POA: Diagnosis not present

## 2019-03-28 DIAGNOSIS — E876 Hypokalemia: Secondary | ICD-10-CM | POA: Diagnosis not present

## 2019-03-28 DIAGNOSIS — Z9221 Personal history of antineoplastic chemotherapy: Secondary | ICD-10-CM | POA: Insufficient documentation

## 2019-03-28 DIAGNOSIS — Z9011 Acquired absence of right breast and nipple: Secondary | ICD-10-CM | POA: Diagnosis not present

## 2019-03-28 DIAGNOSIS — C773 Secondary and unspecified malignant neoplasm of axilla and upper limb lymph nodes: Secondary | ICD-10-CM | POA: Diagnosis not present

## 2019-03-28 DIAGNOSIS — Z170 Estrogen receptor positive status [ER+]: Secondary | ICD-10-CM | POA: Insufficient documentation

## 2019-03-28 DIAGNOSIS — E785 Hyperlipidemia, unspecified: Secondary | ICD-10-CM

## 2019-03-28 DIAGNOSIS — Z79899 Other long term (current) drug therapy: Secondary | ICD-10-CM | POA: Insufficient documentation

## 2019-03-28 LAB — COMPREHENSIVE METABOLIC PANEL
ALT: 16 U/L (ref 0–44)
AST: 22 U/L (ref 15–41)
Albumin: 3.6 g/dL (ref 3.5–5.0)
Alkaline Phosphatase: 87 U/L (ref 38–126)
Anion gap: 9 (ref 5–15)
BUN: 8 mg/dL (ref 8–23)
CO2: 27 mmol/L (ref 22–32)
Calcium: 9.2 mg/dL (ref 8.9–10.3)
Chloride: 105 mmol/L (ref 98–111)
Creatinine, Ser: 0.44 mg/dL (ref 0.44–1.00)
GFR calc Af Amer: >60 mL/min (ref >60)
GFR calc non Af Amer: >60 mL/min (ref >60)
Glucose, Bld: 124 mg/dL — ABNORMAL HIGH (ref 70–99)
Potassium: 3.6 mmol/L (ref 3.5–5.1)
Sodium: 141 mmol/L (ref 135–145)
Total Bilirubin: 0.7 mg/dL (ref 0.3–1.2)
Total Protein: 6.8 g/dL (ref 6.5–8.1)

## 2019-03-28 LAB — CBC WITH DIFFERENTIAL/PLATELET
Abs Immature Granulocytes: 0 10*3/uL (ref 0.00–0.07)
Basophils Absolute: 0 10*3/uL (ref 0.0–0.1)
Basophils Relative: 0 %
Eosinophils Absolute: 0.4 10*3/uL (ref 0.0–0.5)
Eosinophils Relative: 5 %
HCT: 40.3 % (ref 36.0–46.0)
Hemoglobin: 12.7 g/dL (ref 12.0–15.0)
Immature Granulocytes: 0 %
Lymphocytes Relative: 32 %
Lymphs Abs: 2.2 10*3/uL (ref 0.7–4.0)
MCH: 29.5 pg (ref 26.0–34.0)
MCHC: 31.5 g/dL (ref 30.0–36.0)
MCV: 93.5 fL (ref 80.0–100.0)
Monocytes Absolute: 0.7 10*3/uL (ref 0.1–1.0)
Monocytes Relative: 9 %
Neutro Abs: 3.7 10*3/uL (ref 1.7–7.7)
Neutrophils Relative %: 54 %
Platelets: 300 10*3/uL (ref 150–400)
RBC: 4.31 MIL/uL (ref 3.87–5.11)
RDW: 18.6 % — ABNORMAL HIGH (ref 11.5–15.5)
WBC: 6.9 10*3/uL (ref 4.0–10.5)
nRBC: 0 % (ref 0.0–0.2)

## 2019-03-28 LAB — LIPID PANEL
Cholesterol: 181 mg/dL (ref 0–200)
HDL: 56 mg/dL (ref >40)
LDL Cholesterol: 85 mg/dL (ref 0–99)
Total CHOL/HDL Ratio: 3.2 RATIO
Triglycerides: 202 mg/dL — ABNORMAL HIGH (ref <150)
VLDL: 40 mg/dL (ref 0–40)

## 2019-03-28 NOTE — Telephone Encounter (Signed)
Called pt and asked about whether she has went to dentist.  Dr Janese Banks was going to start her on bisphosphonate and she said that she went to dentist and she needs a cap on one of her teeth but otherwise everything is good. She states the dentist said they would send a letter to [place]. Will recheck later

## 2019-03-31 ENCOUNTER — Other Ambulatory Visit: Payer: Self-pay

## 2019-03-31 ENCOUNTER — Ambulatory Visit
Admission: RE | Admit: 2019-03-31 | Discharge: 2019-03-31 | Disposition: A | Discharge status: Home / Self Care | Payer: Medicare Other | Source: Ambulatory Visit | Attending: Radiation Oncology | Admitting: Radiation Oncology

## 2019-03-31 DIAGNOSIS — C50911 Malignant neoplasm of unspecified site of right female breast: Secondary | ICD-10-CM | POA: Diagnosis not present

## 2019-03-31 DIAGNOSIS — C773 Secondary and unspecified malignant neoplasm of axilla and upper limb lymph nodes: Secondary | ICD-10-CM | POA: Diagnosis not present

## 2019-03-31 DIAGNOSIS — C50411 Malignant neoplasm of upper-outer quadrant of right female breast: Secondary | ICD-10-CM | POA: Diagnosis not present

## 2019-03-31 DIAGNOSIS — Z170 Estrogen receptor positive status [ER+]: Secondary | ICD-10-CM | POA: Diagnosis not present

## 2019-04-01 ENCOUNTER — Telehealth: Payer: Self-pay

## 2019-04-01 ENCOUNTER — Other Ambulatory Visit: Payer: Self-pay

## 2019-04-01 ENCOUNTER — Ambulatory Visit
Admission: RE | Admit: 2019-04-01 | Discharge: 2019-04-01 | Disposition: A | Discharge status: Home / Self Care | Payer: Medicare Other | Source: Ambulatory Visit | Attending: Radiation Oncology | Admitting: Radiation Oncology

## 2019-04-01 DIAGNOSIS — C50911 Malignant neoplasm of unspecified site of right female breast: Secondary | ICD-10-CM | POA: Diagnosis not present

## 2019-04-01 DIAGNOSIS — C50411 Malignant neoplasm of upper-outer quadrant of right female breast: Secondary | ICD-10-CM | POA: Diagnosis not present

## 2019-04-01 DIAGNOSIS — Z170 Estrogen receptor positive status [ER+]: Secondary | ICD-10-CM | POA: Diagnosis not present

## 2019-04-01 DIAGNOSIS — C773 Secondary and unspecified malignant neoplasm of axilla and upper limb lymph nodes: Secondary | ICD-10-CM | POA: Diagnosis not present

## 2019-04-01 NOTE — Telephone Encounter (Signed)
Patient is needing dental clearance so she could start taking Zometa. Awaiting on dental clearance.

## 2019-04-02 ENCOUNTER — Ambulatory Visit
Admission: RE | Admit: 2019-04-02 | Discharge: 2019-04-02 | Disposition: A | Discharge status: Home / Self Care | Payer: Medicare Other | Source: Ambulatory Visit | Attending: Radiation Oncology | Admitting: Radiation Oncology

## 2019-04-02 ENCOUNTER — Other Ambulatory Visit: Payer: Self-pay

## 2019-04-02 DIAGNOSIS — C50411 Malignant neoplasm of upper-outer quadrant of right female breast: Secondary | ICD-10-CM | POA: Diagnosis not present

## 2019-04-02 DIAGNOSIS — C773 Secondary and unspecified malignant neoplasm of axilla and upper limb lymph nodes: Secondary | ICD-10-CM | POA: Diagnosis not present

## 2019-04-02 DIAGNOSIS — C50911 Malignant neoplasm of unspecified site of right female breast: Secondary | ICD-10-CM | POA: Diagnosis not present

## 2019-04-02 DIAGNOSIS — Z170 Estrogen receptor positive status [ER+]: Secondary | ICD-10-CM | POA: Diagnosis not present

## 2019-04-03 ENCOUNTER — Telehealth: Payer: Self-pay

## 2019-04-03 ENCOUNTER — Ambulatory Visit
Admission: RE | Admit: 2019-04-03 | Discharge: 2019-04-03 | Disposition: A | Discharge status: Home / Self Care | Payer: Medicare Other | Source: Ambulatory Visit | Attending: Radiation Oncology | Admitting: Radiation Oncology

## 2019-04-03 ENCOUNTER — Other Ambulatory Visit: Payer: Self-pay

## 2019-04-03 DIAGNOSIS — Z4431 Encounter for fitting and adjustment of external right breast prosthesis: Secondary | ICD-10-CM | POA: Diagnosis not present

## 2019-04-03 DIAGNOSIS — C50411 Malignant neoplasm of upper-outer quadrant of right female breast: Secondary | ICD-10-CM | POA: Diagnosis not present

## 2019-04-03 DIAGNOSIS — C50911 Malignant neoplasm of unspecified site of right female breast: Secondary | ICD-10-CM | POA: Diagnosis not present

## 2019-04-03 DIAGNOSIS — Z170 Estrogen receptor positive status [ER+]: Secondary | ICD-10-CM | POA: Diagnosis not present

## 2019-04-03 DIAGNOSIS — C50111 Malignant neoplasm of central portion of right female breast: Secondary | ICD-10-CM | POA: Diagnosis not present

## 2019-04-03 DIAGNOSIS — C773 Secondary and unspecified malignant neoplasm of axilla and upper limb lymph nodes: Secondary | ICD-10-CM | POA: Diagnosis not present

## 2019-04-03 NOTE — Telephone Encounter (Signed)
Patient's dental clearance was received from [place]. This could be found in Media tab. Dr. Janese Banks was informed.

## 2019-04-03 NOTE — Telephone Encounter (Signed)
After reviewing patient's dental clearance letter, I called [facility] and we did not get dental clearance as of yet. I was told by Dr. Harriet Pho office that patient had an appointment on 04/08/2019 and then following up with fillings and that she should be done by the end of the year. Therefore, I will resend the dental clearance letter and hopefully we will get clearance so patient could start her Zometa.

## 2019-04-04 ENCOUNTER — Other Ambulatory Visit: Payer: Self-pay

## 2019-04-04 ENCOUNTER — Ambulatory Visit
Admission: RE | Admit: 2019-04-04 | Discharge: 2019-04-04 | Disposition: A | Discharge status: Home / Self Care | Payer: Medicare Other | Source: Ambulatory Visit | Attending: Radiation Oncology | Admitting: Radiation Oncology

## 2019-04-04 DIAGNOSIS — C773 Secondary and unspecified malignant neoplasm of axilla and upper limb lymph nodes: Secondary | ICD-10-CM | POA: Diagnosis not present

## 2019-04-04 DIAGNOSIS — C50411 Malignant neoplasm of upper-outer quadrant of right female breast: Secondary | ICD-10-CM | POA: Diagnosis not present

## 2019-04-04 DIAGNOSIS — Z170 Estrogen receptor positive status [ER+]: Secondary | ICD-10-CM | POA: Diagnosis not present

## 2019-04-04 DIAGNOSIS — C50911 Malignant neoplasm of unspecified site of right female breast: Secondary | ICD-10-CM | POA: Diagnosis not present

## 2019-04-07 ENCOUNTER — Other Ambulatory Visit: Payer: Self-pay

## 2019-04-07 ENCOUNTER — Ambulatory Visit: Payer: Medicare Other | Attending: Oncology | Admitting: Occupational Therapy

## 2019-04-07 ENCOUNTER — Ambulatory Visit
Admission: RE | Admit: 2019-04-07 | Discharge: 2019-04-07 | Disposition: A | Discharge status: Home / Self Care | Payer: Medicare Other | Source: Ambulatory Visit | Attending: Radiation Oncology | Admitting: Radiation Oncology

## 2019-04-07 DIAGNOSIS — L905 Scar conditions and fibrosis of skin: Secondary | ICD-10-CM | POA: Diagnosis not present

## 2019-04-07 DIAGNOSIS — I972 Postmastectomy lymphedema syndrome: Secondary | ICD-10-CM | POA: Diagnosis not present

## 2019-04-07 DIAGNOSIS — C773 Secondary and unspecified malignant neoplasm of axilla and upper limb lymph nodes: Secondary | ICD-10-CM | POA: Diagnosis not present

## 2019-04-07 DIAGNOSIS — C50411 Malignant neoplasm of upper-outer quadrant of right female breast: Secondary | ICD-10-CM | POA: Diagnosis not present

## 2019-04-07 DIAGNOSIS — C50911 Malignant neoplasm of unspecified site of right female breast: Secondary | ICD-10-CM | POA: Diagnosis not present

## 2019-04-07 DIAGNOSIS — Z170 Estrogen receptor positive status [ER+]: Secondary | ICD-10-CM | POA: Diagnosis not present

## 2019-04-07 NOTE — Therapy (Signed)
[place] [facility] [address], [place], [address] Phone: [phone]   Fax:  [phone]  Occupational Therapy Treatment  Patient Details  Name: Theresa Reid MRN: [id number] Date of Birth: 1940/10/14 Referring Provider (OT): Avon Gully   Encounter Date: 04/07/2019  OT End of Session - 04/07/19 1037    Visit Number  7    Number of Visits  10    Date for OT Re-Evaluation  06/30/19    OT Start Time  0940    OT Stop Time  1016    OT Time Calculation (min)  36 min    Activity Tolerance  Patient tolerated treatment well    Behavior During Therapy  [facility] for tasks assessed/performed       Past Medical History:  Diagnosis Date  . Arthritis   . Breast cancer ([place])   . Diverticulitis   . Family history of breast cancer   . GERD (gastroesophageal reflux disease)   . Hyperlipidemia   . Hypertension     Past Surgical History:  Procedure Laterality Date  . BREAST BIOPSY Right 05/28/2018   [place] bx, INVASIVE MAMMARY CARCINOMA WITH LOBULAR FEATURES  . BREAST LUMPECTOMY Right 06/12/2018   [facility], lobular features  . BREAST LUMPECTOMY WITH SENTINEL LYMPH NODE BIOPSY Right 06/12/2018   Procedure: RIGHT BREAST WIDE EXCISION WITH SENTINEL LYMPH NODE BX;  Surgeon: Robert Bellow, MD;  Location: [facility] ORS;  Service: General;  Laterality: Right;  . CHOLECYSTECTOMY    . [place]  . INCISION AND DRAINAGE OF WOUND Right 10/30/2018   Procedure: IRRIGATION AND DEBRIDEMENT RIGHT CHEST WALL WOUND;  Surgeon: Robert Bellow, MD;  Location: [facility] ORS;  Service: General;  Laterality: Right;  . JOINT REPLACEMENT    . MASTECTOMY WITH AXILLARY LYMPH NODE DISSECTION Right 09/20/2018   Procedure: MASTECTOMY WITH AXILLARY LYMPH NODE DISSECTION RIGHT;  Surgeon: Robert Bellow, MD;  Location: [facility] ORS;  Service: General;  Laterality: Right;  . ovaraian cyst removal Right    Cyst only (NOT OVARY)  . PORTACATH PLACEMENT  Left 09/20/2018   Procedure: INSERTION PORT-A-CATH, LEFT;  Surgeon: Robert Bellow, MD;  Location: [facility] ORS;  Service: General;  Laterality: Left;  . TOTAL KNEE ARTHROPLASTY Bilateral 05/05/2010    There were no vitals filed for this visit.  Subjective Assessment - 04/07/19 1025    Subjective   My chemo is done - buy my neuropathy is worse in my feet than it was , hands to - but not as bad as my feet - doing okay - little tired - but I do some exercicses at home- I am in my 3rd week of radiation -    Pertinent History  Patient had her original lumpectomy and sentinel lymph node biopsy back in February 2020 R breast   She then had an MRI which showed more areas of enhancement and was taken back to the OR for mastectomy and axillary lymph node dissection on 09/20/2018 which showed 15 of the lymph nodes were also positive for malignancy and this upstaged her to stage IIIa disease.  Plan was to offer her adjuvant chemotherapy following this.  However her mastectomy course was complicated by flap necrosis requiring debridement antibiotics and she had to be also taken to the OR on 10/30/2018.  Her wound has not completely healed yet and she still has a drain in place.  She seen  Dr. Dahlia Byes on 11/18/2018.  There is concern  for early cellulitis of her chest wall.  Dr. Dahlia Byes after his visit started  patient on Augmentin for 2 more weeks which further delays her chemotherapy. Lymphedema in R UE per pt developed after surgery on 10/30/2018 - stayed the same - not going down - denies pain    Patient Stated Goals  I want to get sleeve so my arm don't  get big and to  prevent infection    Currently in Pain?  No/denies          LYMPHEDEMA/ONCOLOGY QUESTIONNAIRE - 04/07/19 0954      Right Upper Extremity Lymphedema   15 cm Proximal to Olecranon Process  37 cm    10 cm Proximal to Olecranon Process  37 cm    Olecranon Process  25 cm    15 cm Proximal to Ulnar Styloid Process  22 cm    10 cm Proximal to Ulnar  Styloid Process  20 cm    Just Proximal to Ulnar Styloid Process  15.3 cm    Across Hand at [employer]  18 cm    At [place] of 2nd Digit  5.8 cm    At [facility] of Thumb  6 cm       Pt arrive with her [employer] Custom arm sleeve and glove = appear her sleeve is sliding down  Pt report she lost about 15 lbs since Sept -  And 40 the last year   Her measurements are up at wrist and 10 cm area on forearm  But upper arm decrease (weight loss)  Pt in her 3rd week of radiation -report some increase stiffness in R shoulder and AROM - but pt to cont with her HEP - pt has arthritis too in bilateral shoulders   Would recommend for pt to get earlier replacement for [employer] sleeve because of weight loss and sleeve slipping down -and being only 1/2 thru radiation                OT Education - 04/07/19 1027    Education Details  wearing of compression garments -add night time tubigrip    Person(s) Educated  Patient    Methods  Explanation;Demonstration;Tactile cues;Verbal cues;Handout    Comprehension  Returned demonstration;Verbalized understanding       OT Short Term Goals - 01/14/19 1557      OT SHORT TERM GOAL #1   Title  Pt's R UE circumference decrease by at least 1cm -2 cm from hand to upper arm - to be fitted for appropriate compression garments    Status  Achieved        OT Long Term Goals - 01/14/19 1557      OT LONG TERM GOAL #1   Title  Pt to be fitted wth correct compression for R UE and if indiicated chest to decrease lymphedema and prevent cellulitis    Status  Achieved      OT LONG TERM GOAL #2   Title  Pt maintain her L UE lymphedema  circumference with correct compression garments thru out her CA treatment    Baseline  pt half way with chemo - reassess end of chemo - ? if need radiation    Time  12    Period  Weeks    Status  New    [place] Date  03/11/19            Plan - 04/07/19 1038    Clinical Impression Statement  Pt arrive this  date - she is done with chemo and 3rd week into radiation -report her R shoulder motion not as good - tight under her arm but also her arthritis acting up - weaing her daytime sleeve and glove - upon assessment - pt measurement appear good on R arm - but pt lost about 15 lbs since starting to wear her sleeve and it is sliding down - pt in need for new compression sleeve - would contact [place] for assistance - and contact pt /[place] if can get measured for new one - in meantime provided pt with temporary compression sleeve and isotoner glove for night time    OT Occupational Profile and History  Problem Focused Assessment - Including review of records relating to presenting problem    Occupational performance deficits (Please refer to evaluation for details):  ADL's;Play;Leisure;IADL's    Body Structure / Function / Physical Skills  ADL;UE functional use;Scar mobility;Skin integrity;Edema    Rehab Potential  Good    Clinical Decision Making  Several treatment options, min-mod task modification necessary    Comorbidities Affecting Occupational Performance:  May have comorbidities impacting occupational performance    Modification or Assistance to Complete Evaluation   No modification of tasks or assist necessary to complete eval    OT Frequency  --   3 wks   OT Duration  12 weeks    OT Treatment/Interventions  Self-care/ADL training;Manual lymph drainage;Therapeutic exercise;Patient/family education;Scar mobilization;Manual Therapy    Plan  check on her circumference in L UE last week of radiation -and check on new comrpession sleeve and ROM    OT Home Exercise Plan  see pt instruction    Consulted and Agree with Plan of Care  Patient       Patient will benefit from skilled therapeutic intervention in order to improve the following deficits and impairments:   Body Structure / Function / Physical Skills: ADL, UE functional use, Scar mobility, Skin integrity, Edema       Visit  Diagnosis: Scar condition and fibrosis of skin  Postmastectomy lymphedema syndrome    Problem List Patient Active Problem List   Diagnosis Date Noted  . Infected skin flap 10/09/2018  . Cataract 09/24/2018  . Urge incontinence of urine 09/24/2018  . Malignant neoplasm of right female breast ([place]) 06/07/2018  . Goals of care, counseling/discussion 06/07/2018  . Family history of breast cancer   . Dairy product intolerance 01/14/2018  . Seborrheic keratosis 04/10/2017  . Dysuria 02/19/2017  . Visual disturbance 10/03/2016  . GERD (gastroesophageal reflux disease) 09/14/2016  . HLD (hyperlipidemia) 08/13/2015  . Elevated alkaline phosphatase level 02/14/2015  . Hypertension 11/13/2014  . 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    Rosalyn Gess OTR/L, CLT 04/07/2019, 4:11 PM  [place] [facility] [address], [place], [address] Phone: [phone]   Fax:  [phone]  Name: SHAWNESE HURSTON MRN: [id number] Date of Birth: 26-Apr-1940

## 2019-04-07 NOTE — Patient Instructions (Signed)
Will contact [employer] - to ask for funding to get pt new compression sleeve - she lost 15lbs since Febr  And sleeve to large on upperarm   Cont AAROM for shoulder flexion and ABD on wall to maintain her ROM during radiation

## 2019-04-08 ENCOUNTER — Other Ambulatory Visit: Payer: Self-pay

## 2019-04-08 ENCOUNTER — Ambulatory Visit
Admission: RE | Admit: 2019-04-08 | Discharge: 2019-04-08 | Disposition: A | Discharge status: Home / Self Care | Payer: Medicare Other | Source: Ambulatory Visit | Attending: Radiation Oncology | Admitting: Radiation Oncology

## 2019-04-08 DIAGNOSIS — C773 Secondary and unspecified malignant neoplasm of axilla and upper limb lymph nodes: Secondary | ICD-10-CM | POA: Diagnosis not present

## 2019-04-08 DIAGNOSIS — Z170 Estrogen receptor positive status [ER+]: Secondary | ICD-10-CM | POA: Diagnosis not present

## 2019-04-08 DIAGNOSIS — C50911 Malignant neoplasm of unspecified site of right female breast: Secondary | ICD-10-CM | POA: Diagnosis not present

## 2019-04-08 DIAGNOSIS — C50411 Malignant neoplasm of upper-outer quadrant of right female breast: Secondary | ICD-10-CM | POA: Diagnosis not present

## 2019-04-09 ENCOUNTER — Ambulatory Visit: Payer: Medicare Other

## 2019-04-10 ENCOUNTER — Other Ambulatory Visit: Payer: Self-pay

## 2019-04-10 ENCOUNTER — Ambulatory Visit
Admission: RE | Admit: 2019-04-10 | Discharge: 2019-04-10 | Disposition: A | Discharge status: Home / Self Care | Payer: Medicare Other | Source: Ambulatory Visit | Attending: Radiation Oncology | Admitting: Radiation Oncology

## 2019-04-10 DIAGNOSIS — C50911 Malignant neoplasm of unspecified site of right female breast: Secondary | ICD-10-CM | POA: Diagnosis not present

## 2019-04-10 DIAGNOSIS — C50411 Malignant neoplasm of upper-outer quadrant of right female breast: Secondary | ICD-10-CM | POA: Diagnosis not present

## 2019-04-10 DIAGNOSIS — Z170 Estrogen receptor positive status [ER+]: Secondary | ICD-10-CM | POA: Diagnosis not present

## 2019-04-10 DIAGNOSIS — C773 Secondary and unspecified malignant neoplasm of axilla and upper limb lymph nodes: Secondary | ICD-10-CM | POA: Diagnosis not present

## 2019-04-11 ENCOUNTER — Ambulatory Visit
Admission: RE | Admit: 2019-04-11 | Discharge: 2019-04-11 | Disposition: A | Discharge status: Home / Self Care | Payer: Medicare Other | Source: Ambulatory Visit | Attending: Radiation Oncology | Admitting: Radiation Oncology

## 2019-04-11 ENCOUNTER — Other Ambulatory Visit: Payer: Self-pay

## 2019-04-11 DIAGNOSIS — C50911 Malignant neoplasm of unspecified site of right female breast: Secondary | ICD-10-CM | POA: Diagnosis not present

## 2019-04-11 DIAGNOSIS — C50411 Malignant neoplasm of upper-outer quadrant of right female breast: Secondary | ICD-10-CM | POA: Diagnosis not present

## 2019-04-11 DIAGNOSIS — C773 Secondary and unspecified malignant neoplasm of axilla and upper limb lymph nodes: Secondary | ICD-10-CM | POA: Diagnosis not present

## 2019-04-11 DIAGNOSIS — Z170 Estrogen receptor positive status [ER+]: Secondary | ICD-10-CM | POA: Diagnosis not present

## 2019-04-14 ENCOUNTER — Other Ambulatory Visit: Payer: Self-pay

## 2019-04-14 ENCOUNTER — Ambulatory Visit
Admission: RE | Admit: 2019-04-14 | Discharge: 2019-04-14 | Disposition: A | Discharge status: Home / Self Care | Payer: Medicare Other | Source: Ambulatory Visit | Attending: Radiation Oncology | Admitting: Radiation Oncology

## 2019-04-14 DIAGNOSIS — C50911 Malignant neoplasm of unspecified site of right female breast: Secondary | ICD-10-CM | POA: Diagnosis not present

## 2019-04-14 DIAGNOSIS — C773 Secondary and unspecified malignant neoplasm of axilla and upper limb lymph nodes: Secondary | ICD-10-CM | POA: Diagnosis not present

## 2019-04-15 ENCOUNTER — Other Ambulatory Visit: Payer: Self-pay

## 2019-04-15 ENCOUNTER — Ambulatory Visit
Admission: RE | Admit: 2019-04-15 | Discharge: 2019-04-15 | Disposition: A | Discharge status: Home / Self Care | Payer: Medicare Other | Source: Ambulatory Visit | Attending: Radiation Oncology | Admitting: Radiation Oncology

## 2019-04-15 DIAGNOSIS — C50411 Malignant neoplasm of upper-outer quadrant of right female breast: Secondary | ICD-10-CM | POA: Diagnosis not present

## 2019-04-15 DIAGNOSIS — C773 Secondary and unspecified malignant neoplasm of axilla and upper limb lymph nodes: Secondary | ICD-10-CM | POA: Diagnosis not present

## 2019-04-15 DIAGNOSIS — C50911 Malignant neoplasm of unspecified site of right female breast: Secondary | ICD-10-CM | POA: Diagnosis not present

## 2019-04-15 DIAGNOSIS — Z170 Estrogen receptor positive status [ER+]: Secondary | ICD-10-CM | POA: Diagnosis not present

## 2019-04-16 ENCOUNTER — Ambulatory Visit
Admission: RE | Admit: 2019-04-16 | Discharge: 2019-04-16 | Disposition: A | Discharge status: Home / Self Care | Payer: Medicare Other | Source: Ambulatory Visit | Attending: Radiation Oncology | Admitting: Radiation Oncology

## 2019-04-16 ENCOUNTER — Encounter: Payer: Self-pay | Admitting: Family Medicine

## 2019-04-16 ENCOUNTER — Other Ambulatory Visit: Payer: Self-pay

## 2019-04-16 DIAGNOSIS — Z170 Estrogen receptor positive status [ER+]: Secondary | ICD-10-CM | POA: Diagnosis not present

## 2019-04-16 DIAGNOSIS — C50411 Malignant neoplasm of upper-outer quadrant of right female breast: Secondary | ICD-10-CM | POA: Diagnosis not present

## 2019-04-16 DIAGNOSIS — C773 Secondary and unspecified malignant neoplasm of axilla and upper limb lymph nodes: Secondary | ICD-10-CM | POA: Diagnosis not present

## 2019-04-16 DIAGNOSIS — C50911 Malignant neoplasm of unspecified site of right female breast: Secondary | ICD-10-CM | POA: Diagnosis not present

## 2019-04-17 ENCOUNTER — Ambulatory Visit
Admission: RE | Admit: 2019-04-17 | Discharge: 2019-04-17 | Disposition: A | Discharge status: Home / Self Care | Payer: Medicare Other | Source: Ambulatory Visit | Attending: Radiation Oncology | Admitting: Radiation Oncology

## 2019-04-17 ENCOUNTER — Other Ambulatory Visit: Payer: Self-pay

## 2019-04-17 DIAGNOSIS — Z4431 Encounter for fitting and adjustment of external right breast prosthesis: Secondary | ICD-10-CM | POA: Diagnosis not present

## 2019-04-17 DIAGNOSIS — C773 Secondary and unspecified malignant neoplasm of axilla and upper limb lymph nodes: Secondary | ICD-10-CM | POA: Diagnosis not present

## 2019-04-17 DIAGNOSIS — C50111 Malignant neoplasm of central portion of right female breast: Secondary | ICD-10-CM | POA: Diagnosis not present

## 2019-04-17 DIAGNOSIS — Z170 Estrogen receptor positive status [ER+]: Secondary | ICD-10-CM | POA: Diagnosis not present

## 2019-04-17 DIAGNOSIS — C50911 Malignant neoplasm of unspecified site of right female breast: Secondary | ICD-10-CM | POA: Diagnosis not present

## 2019-04-17 DIAGNOSIS — C50411 Malignant neoplasm of upper-outer quadrant of right female breast: Secondary | ICD-10-CM | POA: Diagnosis not present

## 2019-04-17 MED ORDER — ATORVASTATIN CALCIUM 80 MG PO TABS: 80.0000 mg | ORAL_TABLET | Freq: Every day | ORAL | 1 refills | Status: DC

## 2019-04-21 ENCOUNTER — Ambulatory Visit
Admission: RE | Admit: 2019-04-21 | Discharge: 2019-04-21 | Disposition: A | Discharge status: Home / Self Care | Payer: Medicare Other | Source: Ambulatory Visit | Attending: Radiation Oncology | Admitting: Radiation Oncology

## 2019-04-21 ENCOUNTER — Inpatient Hospital Stay: Payer: Medicare Other

## 2019-04-21 ENCOUNTER — Other Ambulatory Visit: Payer: Self-pay

## 2019-04-21 ENCOUNTER — Encounter: Payer: Self-pay | Admitting: Oncology

## 2019-04-21 ENCOUNTER — Inpatient Hospital Stay (HOSPITAL_BASED_OUTPATIENT_CLINIC_OR_DEPARTMENT_OTHER): Payer: Medicare Other | Admitting: Oncology

## 2019-04-21 VITALS — BP 131/63 | HR 97 | Temp 98.1°F | Wt 173.0 lb

## 2019-04-21 DIAGNOSIS — C50911 Malignant neoplasm of unspecified site of right female breast: Secondary | ICD-10-CM | POA: Diagnosis not present

## 2019-04-21 DIAGNOSIS — T451X5A Adverse effect of antineoplastic and immunosuppressive drugs, initial encounter: Secondary | ICD-10-CM | POA: Diagnosis not present

## 2019-04-21 DIAGNOSIS — G620 Drug-induced polyneuropathy: Secondary | ICD-10-CM | POA: Diagnosis not present

## 2019-04-21 DIAGNOSIS — Z170 Estrogen receptor positive status [ER+]: Secondary | ICD-10-CM | POA: Diagnosis not present

## 2019-04-21 DIAGNOSIS — C773 Secondary and unspecified malignant neoplasm of axilla and upper limb lymph nodes: Secondary | ICD-10-CM | POA: Diagnosis not present

## 2019-04-21 DIAGNOSIS — E876 Hypokalemia: Secondary | ICD-10-CM

## 2019-04-21 DIAGNOSIS — C50411 Malignant neoplasm of upper-outer quadrant of right female breast: Secondary | ICD-10-CM | POA: Diagnosis not present

## 2019-04-21 DIAGNOSIS — Z79899 Other long term (current) drug therapy: Secondary | ICD-10-CM | POA: Diagnosis not present

## 2019-04-21 DIAGNOSIS — Z9011 Acquired absence of right breast and nipple: Secondary | ICD-10-CM | POA: Diagnosis not present

## 2019-04-21 DIAGNOSIS — Z9221 Personal history of antineoplastic chemotherapy: Secondary | ICD-10-CM | POA: Diagnosis not present

## 2019-04-21 DIAGNOSIS — R5383 Other fatigue: Secondary | ICD-10-CM

## 2019-04-21 LAB — CBC WITH DIFFERENTIAL/PLATELET
Abs Immature Granulocytes: 0.02 10*3/uL (ref 0.00–0.07)
Basophils Absolute: 0 10*3/uL (ref 0.0–0.1)
Basophils Relative: 0 %
Eosinophils Absolute: 0.2 10*3/uL (ref 0.0–0.5)
Eosinophils Relative: 3 %
HCT: 39.9 % (ref 36.0–46.0)
Hemoglobin: 12.7 g/dL (ref 12.0–15.0)
Immature Granulocytes: 0 %
Lymphocytes Relative: 22 %
Lymphs Abs: 1.6 10*3/uL (ref 0.7–4.0)
MCH: 29.7 pg (ref 26.0–34.0)
MCHC: 31.8 g/dL (ref 30.0–36.0)
MCV: 93.2 fL (ref 80.0–100.0)
Monocytes Absolute: 0.7 10*3/uL (ref 0.1–1.0)
Monocytes Relative: 9 %
Neutro Abs: 4.9 10*3/uL (ref 1.7–7.7)
Neutrophils Relative %: 66 %
Platelets: 244 10*3/uL (ref 150–400)
RBC: 4.28 MIL/uL (ref 3.87–5.11)
RDW: 16.6 % — ABNORMAL HIGH (ref 11.5–15.5)
WBC: 7.5 10*3/uL (ref 4.0–10.5)
nRBC: 0 % (ref 0.0–0.2)

## 2019-04-21 LAB — IRON AND TIBC
Iron: 64 ug/dL (ref 28–170)
Saturation Ratios: 19 % (ref 10.4–31.8)
TIBC: 336 ug/dL (ref 250–450)
UIBC: 272 ug/dL

## 2019-04-21 LAB — COMPREHENSIVE METABOLIC PANEL
ALT: 16 U/L (ref 0–44)
AST: 24 U/L (ref 15–41)
Albumin: 3.6 g/dL (ref 3.5–5.0)
Alkaline Phosphatase: 98 U/L (ref 38–126)
Anion gap: 10 (ref 5–15)
BUN: 8 mg/dL (ref 8–23)
CO2: 27 mmol/L (ref 22–32)
Calcium: 8.9 mg/dL (ref 8.9–10.3)
Chloride: 103 mmol/L (ref 98–111)
Creatinine, Ser: 0.55 mg/dL (ref 0.44–1.00)
GFR calc Af Amer: >60 mL/min (ref >60)
GFR calc non Af Amer: >60 mL/min (ref >60)
Glucose, Bld: 148 mg/dL — ABNORMAL HIGH (ref 70–99)
Potassium: 3.4 mmol/L — ABNORMAL LOW (ref 3.5–5.1)
Sodium: 140 mmol/L (ref 135–145)
Total Bilirubin: 0.7 mg/dL (ref 0.3–1.2)
Total Protein: 6.8 g/dL (ref 6.5–8.1)

## 2019-04-21 LAB — FOLATE: Folate: 61.8 ng/mL (ref >5.9)

## 2019-04-21 LAB — FERRITIN: Ferritin: 28 ng/mL (ref 11–307)

## 2019-04-21 LAB — TSH: TSH: 2.379 u[IU]/mL (ref 0.350–4.500)

## 2019-04-21 LAB — VITAMIN B12: Vitamin B-12: 296 pg/mL (ref 180–914)

## 2019-04-21 MED ORDER — HEPARIN SOD (PORK) LOCK FLUSH 100 UNIT/ML IV SOLN
500.0000 [IU] | Freq: Once | INTRAVENOUS | Status: AC
  Administered 2019-04-21: 09:00:00 500 [IU] via INTRAVENOUS
  Filled 2019-04-21: qty 5

## 2019-04-21 MED ORDER — POTASSIUM CHLORIDE CRYS ER 20 MEQ PO TBCR: 20.0000 meq | EXTENDED_RELEASE_TABLET | Freq: Every day | ORAL | 0 refills | Status: DC

## 2019-04-21 MED ORDER — ANASTROZOLE 1 MG PO TABS: 1.0000 mg | ORAL_TABLET | Freq: Every day | ORAL | 2 refills | Status: DC

## 2019-04-21 NOTE — Progress Notes (Signed)
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 Patient underwent lumpectomy and sentinel lymph node biopsy in February 2020. Pathology showed invasive lobular carcinoma with positive medial margin which was reexcised with still positive. Tumor was 24 mm, grade 2 ER PR positive and HER-2/neu negative. One sentinel lymph node that was excised was positive for macro met of 8 mm. No extranodal extension was present. MammaPrint came back as high risk with an average 10-year risk of untreated disease is 29%. Patient then underwent a bilateral MRI which showed additional suspicious non-mass enhancement along the inferior margin of the biopsy cavity extending 2 cm highly suspicious for continuous disease and another highly suspicious linear non-mass enhancement at the base of the nipple concerning for multifocal multicentric disease. Patient underwent right mastectomy and axillary lymph node dissectionon 09/20/2018. There was no residual invasive mammary carcinoma within the breast. However 15 out of 20 lymph nodes were involved with metastatic carcinoma measuring up to 6 mm and remaining 5  lymph nodes with isolated tumor cells. Pathologic stage was [id number]  Despite having surgery in May 2020 which was a second surgery-patient has not been able to start adjuvant chemotherapy. Her mastectomy was complicated by flap necrosis requiring debridement antibiotics and she had to be taken back to the OR for the same. Given her age and postmastectomy complications and delayed wound healing-weekly Taxol x12 cycles was chosen as the regimen for her adjuvant chemotherapy which she completed on 02/25/2019.   Interval history-she does report tingling numbness in her bilateral fingers and toes.  It makes it difficult for her to drive.  Energy levels are overall improving.  ECOG PS- 1 Pain scale- 0   Review of systems- Review of Systems  Constitutional: Positive for malaise/fatigue. Negative for chills, fever and weight loss.  HENT: Negative for congestion, ear discharge and nosebleeds.   Eyes: Negative for blurred vision.  Respiratory: Negative for cough, hemoptysis, sputum production, shortness of breath and wheezing.   Cardiovascular: Negative for chest pain, palpitations, orthopnea and claudication.  Gastrointestinal: Negative for abdominal pain, blood in stool, constipation, diarrhea, heartburn, melena, nausea and vomiting.  Genitourinary: Negative for dysuria, flank pain, frequency, hematuria and urgency.  Musculoskeletal: Negative for back pain, joint pain and myalgias.  Skin: Negative for rash.  Neurological: Positive for sensory change (Peripheral neuropathy). Negative for dizziness, tingling, focal weakness, seizures, weakness and headaches.  Endo/Heme/Allergies: Does not bruise/bleed easily.  Psychiatric/Behavioral: Negative for depression and suicidal ideas. The patient does not have insomnia.        No Known Allergies   Past Medical History:  Diagnosis Date  . Arthritis   . Breast cancer ([place])   . Diverticulitis   . Family history of breast cancer   . GERD  (gastroesophageal  reflux disease)   . Hyperlipidemia   . Hypertension      Past Surgical History:  Procedure Laterality Date  . BREAST BIOPSY Right 05/28/2018   [place] bx, INVASIVE MAMMARY CARCINOMA WITH LOBULAR FEATURES  . BREAST LUMPECTOMY Right 06/12/2018   [facility], lobular features  . BREAST LUMPECTOMY WITH SENTINEL LYMPH NODE BIOPSY Right 06/12/2018   Procedure: RIGHT BREAST WIDE EXCISION WITH SENTINEL LYMPH NODE BX;  Surgeon: Robert Bellow, MD;  Location: [facility] ORS;  Service: General;  Laterality: Right;  . CHOLECYSTECTOMY    . [place]  . INCISION AND DRAINAGE OF WOUND Right 10/30/2018   Procedure: IRRIGATION AND DEBRIDEMENT RIGHT CHEST WALL WOUND;  Surgeon: Robert Bellow, MD;  Location: [facility] ORS;  Service: General;  Laterality: Right;  . JOINT REPLACEMENT    . MASTECTOMY WITH AXILLARY LYMPH NODE DISSECTION Right 09/20/2018   Procedure: MASTECTOMY WITH AXILLARY LYMPH NODE DISSECTION RIGHT;  Surgeon: Robert Bellow, MD;  Location: [facility] ORS;  Service: General;  Laterality: Right;  . ovaraian cyst removal Right    Cyst only (NOT OVARY)  . PORTACATH PLACEMENT Left 09/20/2018   Procedure: INSERTION PORT-A-CATH, LEFT;  Surgeon: Robert Bellow, MD;  Location: [facility] ORS;  Service: General;  Laterality: Left;  . TOTAL KNEE ARTHROPLASTY Bilateral 05/05/2010    Social History   Socioeconomic History  . Marital status: Single    Spouse name: Not on file  . Number of children: Not on file  . Years of education: Not on file  . Highest education level: Not on file  Occupational History  . Occupation: retired  Tobacco Use  . Smoking status: Never Smoker  . Smokeless tobacco: Never Used  Substance and Sexual Activity  . Alcohol use: No    Alcohol/week: 0.0 standard drinks  . Drug use: No  . Sexual activity: Never  Other Topics Concern  . Not on file  Social History Narrative   Retired    Lives by herself    Pets: None   Caffeine- Coffee 2 cups daily, no  tea/soda      Social Determinants of Health   Financial Resource Strain: Low Risk   . Difficulty of Paying Living Expenses: Not hard at all  Food Insecurity: No Food Insecurity  . Worried About Charity fundraiser in the Last Year: Never true  . Ran Out of Food in the Last Year: Never true  Transportation Needs: No Transportation Needs  . Lack of Transportation (Medical): No  . Lack of Transportation (Non-Medical): No  Physical Activity:   . Days of Exercise per Week: Not on file  . Minutes of Exercise per Session: Not on file  Stress: No Stress Concern Present  . Feeling of Stress : Not at all  Social Connections:   . Frequency of Communication with Friends and Family: Not on file  . Frequency of Social Gatherings with Friends and Family: Not on file  . Attends Religious Services: Not on file  . Active Member of Clubs or Organizations: Not on file  . Attends Archivist Meetings: Not on file  . Marital Status: Not on file  Intimate Partner Violence: Unknown  . Fear of Current or Ex-Partner: Not asked  . Emotionally Abused: Not on file  . Physically Abused: Not on file  . Sexually Abused: Not on file    Family History  Problem Relation Age of Onset  . Alcohol abuse Mother        deceased 59  .  Diabetes Father   . Breast cancer Maternal Aunt        dx 30s; deceased 64s  . Breast cancer Maternal Aunt        dx 21s; deceased 28s     Current Outpatient Medications:  .  amLODipine (NORVASC) 5 MG tablet, TAKE 1 TABLET BY MOUTH  DAILY, Disp: 90 tablet, Rfl: 0 .  atorvastatin (LIPITOR) 80 MG tablet, Take 1 tablet (80 mg total) by mouth daily., Disp: 90 tablet, Rfl: 1 .  CALCIUM-VITAMIN D PO, Take 1 tablet by mouth daily. , Disp: , Rfl:  .  CRANBERRY PO, Take 1 tablet by mouth 2 (two) times daily. , Disp: , Rfl:  .  Multiple Vitamins tablet, Take 1 tablet by mouth daily. , Disp: , Rfl:  .  Omega-3 Fatty Acids (FISH OIL PO), Take 1 capsule by mouth daily. , Disp: ,  Rfl:  .  oxybutynin (DITROPAN) 5 MG tablet, TAKE 1 TABLET BY MOUTH  TWICE DAILY, Disp: 180 tablet, Rfl: 0 .  pantoprazole (PROTONIX) 40 MG tablet, TAKE 1 TABLET BY MOUTH  DAILY, Disp: 90 tablet, Rfl: 0 .  phenazopyridine (PYRIDIUM) 200 MG tablet, Take 1 tablet (200 mg total) by mouth 3 (three) times daily as needed for pain., Disp: 30 tablet, Rfl: 0 .  torsemide (DEMADEX) 5 MG tablet, TAKE 1 TABLET BY MOUTH  DAILY, Disp: 90 tablet, Rfl: 0 .  Turmeric 500 MG CAPS, Take 1,000 mg by mouth 2 (two) times daily. , Disp: , Rfl:  .  anastrozole (ARIMIDEX) 1 MG tablet, Take 1 tablet (1 mg total) by mouth daily. Do not start until completed radiation in Jan 2021, Disp: 30 tablet, Rfl: 2 .  potassium chloride SA (KLOR-CON) 20 MEQ tablet, Take 1 tablet (20 mEq total) by mouth daily., Disp: 45 tablet, Rfl: 0  Current Facility-Administered Medications:  .  piperacillin-tazobactam (ZOSYN) IVPB 3.375 g, 3.375 g, Intravenous, Once, Byrnett, Forest Gleason, MD  Physical exam:  Vitals:   04/21/19 1345  BP: 131/63  Pulse: 97  Temp: 98.1 F (36.7 C)  TempSrc: Tympanic  Weight: 173 lb (78.5 kg)   Physical Exam Constitutional:      General: She is not in acute distress. HENT:     Head: Normocephalic and atraumatic.  Eyes:     Pupils: Pupils are equal, round, and reactive to light.  Cardiovascular:     Rate and Rhythm: Normal rate and regular rhythm.     Heart sounds: Normal heart sounds.  Pulmonary:     Effort: Pulmonary effort is normal.     Breath sounds: Normal breath sounds.  Abdominal:     General: Bowel sounds are normal.     Palpations: Abdomen is soft.  Musculoskeletal:     Cervical back: Normal range of motion.  Skin:    General: Skin is warm and dry.  Neurological:     Mental Status: She is alert and oriented to person, place, and time.      CMP Latest Ref Rng & Units 04/21/2019  Glucose 70 - 99 mg/dL 148(H)  BUN 8 - 23 mg/dL 8  Creatinine 0.44 - 1.00 mg/dL 0.55  Sodium 135 - 145  mmol/L 140  Potassium 3.5 - 5.1 mmol/L 3.4(L)  Chloride 98 - 111 mmol/L 103  CO2 22 - 32 mmol/L 27  Calcium 8.9 - 10.3 mg/dL 8.9  Total Protein 6.5 - 8.1 g/dL 6.8  Total Bilirubin 0.3 - 1.2 mg/dL 0.7  Alkaline Phos 38 - 126 U/L  98  AST 15 - 41 U/L 24  ALT 0 - 44 U/L 16   CBC Latest Ref Rng & Units 04/21/2019  WBC 4.0 - 10.5 K/uL 7.5  Hemoglobin 12.0 - 15.0 g/dL 12.7  Hematocrit 36.0 - 46.0 % 39.9  Platelets 150 - 400 K/uL 244     Assessment and plan- Patient is a 78 y.o. female with invasive mammary carcinoma of the right breast pathological prognostic stage III ApT2pN3a cNX ER PR positive HER-2/neu negative status post right mastectomy and axillary lymph node dissection.  She completed 12 cycles of adjuvant weekly Taxol chemotherapy.  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  We discussed trying Cymbalta or gabapentin.  Also discussed utility of acupuncture.  Patient would like to defer these measures for now and see how her neuropathy progresses over the next 2 to 3 months.  She has started adjuvant radiation treatment which will go on for 5 to 6 weeks.  I will send her a prescription for Arimidex which she will take 1 mg p.o. daily along with calcium and vitamin D.  Her baseline bone density scan was normal.  I will tentatively see her back mid February to see how she is tolerating her Arimidex.  She will tentatively start her first dose of adjuvant Zometa which she will get every 6 months for up to 5 years after obtaining dental clearance.  Hypokalemia: I will renew her potassium prescription 20 mEq orally daily   Visit Diagnosis 1. Malignant neoplasm of right breast in female, estrogen receptor positive, unspecified site of breast ([place])   2. Hypokalemia   3. Chemotherapy-induced peripheral neuropathy ([facility])      Dr. Randa Evens, MD, MPH [facility] [phone] 04/21/2019 1:42 PM

## 2019-04-21 NOTE — Progress Notes (Signed)
Patient stated that she had been doing well with no complaints. Patient would like to know if she should be taking Potassium or not.

## 2019-04-22 ENCOUNTER — Other Ambulatory Visit: Payer: Self-pay | Admitting: Oncology

## 2019-04-22 ENCOUNTER — Other Ambulatory Visit: Payer: Self-pay

## 2019-04-22 ENCOUNTER — Ambulatory Visit
Admission: RE | Admit: 2019-04-22 | Discharge: 2019-04-22 | Disposition: A | Discharge status: Home / Self Care | Payer: Medicare Other | Source: Ambulatory Visit | Attending: Radiation Oncology | Admitting: Radiation Oncology

## 2019-04-22 DIAGNOSIS — C50911 Malignant neoplasm of unspecified site of right female breast: Secondary | ICD-10-CM | POA: Diagnosis not present

## 2019-04-22 DIAGNOSIS — C773 Secondary and unspecified malignant neoplasm of axilla and upper limb lymph nodes: Secondary | ICD-10-CM | POA: Diagnosis not present

## 2019-04-22 DIAGNOSIS — E876 Hypokalemia: Secondary | ICD-10-CM

## 2019-04-22 DIAGNOSIS — Z170 Estrogen receptor positive status [ER+]: Secondary | ICD-10-CM | POA: Diagnosis not present

## 2019-04-22 DIAGNOSIS — T502X5A Adverse effect of carbonic-anhydrase inhibitors, benzothiadiazides and other diuretics, initial encounter: Secondary | ICD-10-CM

## 2019-04-22 DIAGNOSIS — C50411 Malignant neoplasm of upper-outer quadrant of right female breast: Secondary | ICD-10-CM | POA: Diagnosis not present

## 2019-04-23 ENCOUNTER — Other Ambulatory Visit: Payer: Self-pay

## 2019-04-23 ENCOUNTER — Ambulatory Visit
Admission: RE | Admit: 2019-04-23 | Discharge: 2019-04-23 | Disposition: A | Discharge status: Home / Self Care | Payer: Medicare Other | Source: Ambulatory Visit | Attending: Radiation Oncology | Admitting: Radiation Oncology

## 2019-04-23 DIAGNOSIS — Z170 Estrogen receptor positive status [ER+]: Secondary | ICD-10-CM | POA: Diagnosis not present

## 2019-04-23 DIAGNOSIS — C50911 Malignant neoplasm of unspecified site of right female breast: Secondary | ICD-10-CM | POA: Diagnosis not present

## 2019-04-23 DIAGNOSIS — C50411 Malignant neoplasm of upper-outer quadrant of right female breast: Secondary | ICD-10-CM | POA: Diagnosis not present

## 2019-04-23 DIAGNOSIS — C773 Secondary and unspecified malignant neoplasm of axilla and upper limb lymph nodes: Secondary | ICD-10-CM | POA: Diagnosis not present

## 2019-04-24 ENCOUNTER — Other Ambulatory Visit: Payer: Self-pay

## 2019-04-24 ENCOUNTER — Ambulatory Visit
Admission: RE | Admit: 2019-04-24 | Discharge: 2019-04-24 | Disposition: A | Discharge status: Home / Self Care | Payer: Medicare Other | Source: Ambulatory Visit | Attending: Radiation Oncology | Admitting: Radiation Oncology

## 2019-04-24 DIAGNOSIS — C50911 Malignant neoplasm of unspecified site of right female breast: Secondary | ICD-10-CM | POA: Diagnosis not present

## 2019-04-24 DIAGNOSIS — C773 Secondary and unspecified malignant neoplasm of axilla and upper limb lymph nodes: Secondary | ICD-10-CM | POA: Diagnosis not present

## 2019-04-24 DIAGNOSIS — Z170 Estrogen receptor positive status [ER+]: Secondary | ICD-10-CM | POA: Diagnosis not present

## 2019-04-24 DIAGNOSIS — C50411 Malignant neoplasm of upper-outer quadrant of right female breast: Secondary | ICD-10-CM | POA: Diagnosis not present

## 2019-04-28 ENCOUNTER — Other Ambulatory Visit: Payer: Self-pay

## 2019-04-28 ENCOUNTER — Ambulatory Visit
Admission: RE | Admit: 2019-04-28 | Discharge: 2019-04-28 | Disposition: A | Discharge status: Home / Self Care | Payer: Medicare Other | Source: Ambulatory Visit | Attending: Radiation Oncology | Admitting: Radiation Oncology

## 2019-04-28 DIAGNOSIS — C50911 Malignant neoplasm of unspecified site of right female breast: Secondary | ICD-10-CM | POA: Diagnosis not present

## 2019-04-28 DIAGNOSIS — Z170 Estrogen receptor positive status [ER+]: Secondary | ICD-10-CM | POA: Diagnosis not present

## 2019-04-28 DIAGNOSIS — C50411 Malignant neoplasm of upper-outer quadrant of right female breast: Secondary | ICD-10-CM | POA: Diagnosis not present

## 2019-04-28 DIAGNOSIS — C773 Secondary and unspecified malignant neoplasm of axilla and upper limb lymph nodes: Secondary | ICD-10-CM | POA: Insufficient documentation

## 2019-04-29 ENCOUNTER — Ambulatory Visit
Admission: RE | Admit: 2019-04-29 | Discharge: 2019-04-29 | Disposition: A | Discharge status: Home / Self Care | Payer: Medicare Other | Source: Ambulatory Visit | Attending: Radiation Oncology | Admitting: Radiation Oncology

## 2019-04-29 ENCOUNTER — Other Ambulatory Visit: Payer: Self-pay

## 2019-04-29 DIAGNOSIS — C50411 Malignant neoplasm of upper-outer quadrant of right female breast: Secondary | ICD-10-CM | POA: Diagnosis not present

## 2019-04-29 DIAGNOSIS — C773 Secondary and unspecified malignant neoplasm of axilla and upper limb lymph nodes: Secondary | ICD-10-CM | POA: Diagnosis not present

## 2019-04-29 DIAGNOSIS — C50911 Malignant neoplasm of unspecified site of right female breast: Secondary | ICD-10-CM | POA: Diagnosis not present

## 2019-04-29 DIAGNOSIS — Z170 Estrogen receptor positive status [ER+]: Secondary | ICD-10-CM | POA: Diagnosis not present

## 2019-04-29 NOTE — Telephone Encounter (Signed)
Called [facility] and they stated that patient had fillings scheduled to be done on January 19th and then she should be cleared for Zometa. Now just waiting for the clearance letter.

## 2019-04-30 ENCOUNTER — Inpatient Hospital Stay: Payer: Medicare Other | Attending: Oncology | Admitting: Occupational Therapy

## 2019-04-30 ENCOUNTER — Other Ambulatory Visit: Payer: Self-pay

## 2019-04-30 ENCOUNTER — Ambulatory Visit
Admission: RE | Admit: 2019-04-30 | Discharge: 2019-04-30 | Disposition: A | Discharge status: Home / Self Care | Payer: Medicare Other | Source: Ambulatory Visit | Attending: Radiation Oncology | Admitting: Radiation Oncology

## 2019-04-30 DIAGNOSIS — C50911 Malignant neoplasm of unspecified site of right female breast: Secondary | ICD-10-CM | POA: Diagnosis not present

## 2019-04-30 DIAGNOSIS — C50411 Malignant neoplasm of upper-outer quadrant of right female breast: Secondary | ICD-10-CM | POA: Diagnosis not present

## 2019-04-30 DIAGNOSIS — Z170 Estrogen receptor positive status [ER+]: Secondary | ICD-10-CM | POA: Diagnosis not present

## 2019-04-30 DIAGNOSIS — C773 Secondary and unspecified malignant neoplasm of axilla and upper limb lymph nodes: Secondary | ICD-10-CM | POA: Diagnosis not present

## 2019-04-30 DIAGNOSIS — I972 Postmastectomy lymphedema syndrome: Secondary | ICD-10-CM

## 2019-04-30 NOTE — Therapy (Signed)
[facility] [address] [place], [place], [place], [address] Phone: [phone]   Fax:  [phone]  Occupational Therapy screen   Patient Details  Name: Theresa Reid MRN: [id number] Date of Birth: [birth year] Referring Provider (OT): Avon Gully   Encounter Date: 04/30/2019    Past Medical History:  Diagnosis Date  . Arthritis   . Breast cancer ([place])   . Diverticulitis   . Family history of breast cancer   . GERD (gastroesophageal reflux disease)   . Hyperlipidemia   . Hypertension     Past Surgical History:  Procedure Laterality Date  . BREAST BIOPSY Right 05/28/2018   [place] bx, INVASIVE MAMMARY CARCINOMA WITH LOBULAR FEATURES  . BREAST LUMPECTOMY Right 06/12/2018   [facility], lobular features  . BREAST LUMPECTOMY WITH SENTINEL LYMPH NODE BIOPSY Right 06/12/2018   Procedure: RIGHT BREAST WIDE EXCISION WITH SENTINEL LYMPH NODE BX;  Surgeon: Robert Bellow, MD;  Location: [facility] ORS;  Service: General;  Laterality: Right;  . CHOLECYSTECTOMY    . [place]  . INCISION AND DRAINAGE OF WOUND Right 10/30/2018   Procedure: IRRIGATION AND DEBRIDEMENT RIGHT CHEST WALL WOUND;  Surgeon: Robert Bellow, MD;  Location: [facility] OR[facility];  Service: General;  Laterality: Right;  . JOINT REPLACEMENT    . MASTECTOMY WITH AXILLARY LYMPH NODE DISSECTION Right 09/20/2018   Procedure: MASTECTOMY WITH AXILLARY LYMPH NODE DISSECTION RIGHT;  Surgeon: Robert Bellow, MD;  Location: [facility] OR[facility];  Service: General;  Laterality: Right;  . ovaraian cyst removal Right    Cyst only (NOT OVARY)  . PORTACATH PLACEMENT Left 09/20/2018   Procedure: INSERTION PORT-A-CATH, LEFT;  Surgeon: Robert Bellow, MD;  Location: [facility] OR[facility];  Service: General;  Laterality: Left;  . TOTAL KNEE ARTHROPLASTY Bilateral 05/05/2010    There were no vitals filed for this visit.  Subjective Assessment - 04/30/19 1831    Subjective   I am done with radiation  next week - feeling good -lost about 5 more pounds - did not get my new compression sleeve yet - my sleeve realy loose on upper arm          LYMPHEDEMA/ONCOLOGY QUESTIONNAIRE - 04/30/19 1040      Right Upper Extremity Lymphedema   15 cm Proximal to Olecranon Process  37.8 cm    10 cm Proximal to Olecranon Process  35.7 cm    Olecranon Process  24.6 cm    15 cm Proximal to Ulnar Styloid Process  22 cm    10 cm Proximal to Ulnar Styloid Process  19.7 cm    Just Proximal to Ulnar Styloid Process  14.2 cm    Across Hand at [employer]  17.8 cm    At [place] of 2nd Digit  5.8 cm    At [facility] of Thumb  6 cm        Pt was seen for follow up at [facility] this am -her new compression sleeve that was ordered about 12/18 did not come in yet - she lost another 5 lbs   upper arm part of compression very loose Her upper arm measurements cont to increase - pt to call me as soon as she gets her new compression sleeve - glove is still good Pt to use her jovipak more on R upper quadrant for clear trunk lymphedema  Visit Diagnosis: Postmastectomy lymphedema syndrome    Problem List Patient Active Problem List   Diagnosis Date Noted  . Infected skin flap 10/09/2018  . Cataract 09/24/2018  . Urge incontinence of urine 09/24/2018  . Malignant neoplasm of right female breast ([place]) 06/07/2018  . Goals of care, counseling/discussion 06/07/2018  . Family history of breast cancer   . Dairy product intolerance 01/14/2018  . Seborrheic keratosis 04/10/2017  . Dysuria 02/19/2017  . Visual disturbance 10/03/2016  . GERD (gastroesophageal reflux disease) 09/14/2016  . HLD (hyperlipidemia) 08/13/2015  . Elevated alkaline phosphatase level 02/14/2015  . Hypertension 11/13/2014  . 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    Rosalyn Gess OTR/L,CLT 04/30/2019, 6:33 PM  [facility] [address] [place], [place], [place], [address] Phone: [phone]   Fax:  [phone]  Name: Theresa Reid MRN: [id number] Date of Birth: 1940/11/05

## 2019-05-01 ENCOUNTER — Ambulatory Visit
Admission: RE | Admit: 2019-05-01 | Discharge: 2019-05-01 | Disposition: A | Discharge status: Home / Self Care | Payer: Medicare Other | Source: Ambulatory Visit | Attending: Radiation Oncology | Admitting: Radiation Oncology

## 2019-05-01 ENCOUNTER — Other Ambulatory Visit: Payer: Self-pay

## 2019-05-01 DIAGNOSIS — Z170 Estrogen receptor positive status [ER+]: Secondary | ICD-10-CM | POA: Diagnosis not present

## 2019-05-01 DIAGNOSIS — C50411 Malignant neoplasm of upper-outer quadrant of right female breast: Secondary | ICD-10-CM | POA: Diagnosis not present

## 2019-05-01 DIAGNOSIS — C773 Secondary and unspecified malignant neoplasm of axilla and upper limb lymph nodes: Secondary | ICD-10-CM | POA: Diagnosis not present

## 2019-05-01 DIAGNOSIS — C50911 Malignant neoplasm of unspecified site of right female breast: Secondary | ICD-10-CM | POA: Diagnosis not present

## 2019-05-02 ENCOUNTER — Other Ambulatory Visit: Payer: Self-pay

## 2019-05-02 ENCOUNTER — Ambulatory Visit
Admission: RE | Admit: 2019-05-02 | Discharge: 2019-05-02 | Disposition: A | Discharge status: Home / Self Care | Payer: Medicare Other | Source: Ambulatory Visit | Attending: Radiation Oncology | Admitting: Radiation Oncology

## 2019-05-02 DIAGNOSIS — C50911 Malignant neoplasm of unspecified site of right female breast: Secondary | ICD-10-CM | POA: Diagnosis not present

## 2019-05-02 DIAGNOSIS — C50411 Malignant neoplasm of upper-outer quadrant of right female breast: Secondary | ICD-10-CM | POA: Diagnosis not present

## 2019-05-02 DIAGNOSIS — Z170 Estrogen receptor positive status [ER+]: Secondary | ICD-10-CM | POA: Diagnosis not present

## 2019-05-02 DIAGNOSIS — C773 Secondary and unspecified malignant neoplasm of axilla and upper limb lymph nodes: Secondary | ICD-10-CM | POA: Diagnosis not present

## 2019-05-05 ENCOUNTER — Ambulatory Visit (INDEPENDENT_AMBULATORY_CARE_PROVIDER_SITE_OTHER): Payer: Medicare Other | Admitting: Family Medicine

## 2019-05-05 ENCOUNTER — Encounter: Payer: Self-pay | Admitting: Family Medicine

## 2019-05-05 ENCOUNTER — Ambulatory Visit: Payer: Medicare Other

## 2019-05-05 ENCOUNTER — Other Ambulatory Visit: Payer: Self-pay

## 2019-05-05 ENCOUNTER — Ambulatory Visit
Admission: RE | Admit: 2019-05-05 | Discharge: 2019-05-05 | Disposition: A | Discharge status: Home / Self Care | Payer: Medicare Other | Source: Ambulatory Visit | Attending: Radiation Oncology | Admitting: Radiation Oncology

## 2019-05-05 DIAGNOSIS — I10 Essential (primary) hypertension: Secondary | ICD-10-CM | POA: Diagnosis not present

## 2019-05-05 DIAGNOSIS — C773 Secondary and unspecified malignant neoplasm of axilla and upper limb lymph nodes: Secondary | ICD-10-CM | POA: Diagnosis not present

## 2019-05-05 DIAGNOSIS — Z170 Estrogen receptor positive status [ER+]: Secondary | ICD-10-CM | POA: Diagnosis not present

## 2019-05-05 DIAGNOSIS — E785 Hyperlipidemia, unspecified: Secondary | ICD-10-CM

## 2019-05-05 DIAGNOSIS — C50411 Malignant neoplasm of upper-outer quadrant of right female breast: Secondary | ICD-10-CM | POA: Diagnosis not present

## 2019-05-05 DIAGNOSIS — K219 Gastro-esophageal reflux disease without esophagitis: Secondary | ICD-10-CM | POA: Diagnosis not present

## 2019-05-05 DIAGNOSIS — C50911 Malignant neoplasm of unspecified site of right female breast: Secondary | ICD-10-CM | POA: Diagnosis not present

## 2019-05-05 DIAGNOSIS — N3941 Urge incontinence: Secondary | ICD-10-CM

## 2019-05-05 NOTE — Assessment & Plan Note (Signed)
Patient reports low diastolics.  We will have her discontinue her torsemide.  She will stop her potassium supplement as well.  We will recheck labs later this week.  She will continue amlodipine.

## 2019-05-05 NOTE — Assessment & Plan Note (Addendum)
Patient has done quite well with treatment.  Neuropathy seems to be improving.  She will continue to follow with her specialists.  I advised that she contact us or them if she noticed any differences in her right chest wall or left breast.

## 2019-05-05 NOTE — Assessment & Plan Note (Signed)
Asymptomatic.  Discussed she can try tapering off of the Protonix by taking it every other day for a month and then discontinuing.  If she has recurrence of symptoms at any point she can go back to daily dosing.

## 2019-05-05 NOTE — Progress Notes (Signed)
Virtual Visit via telephone Note  This visit type was conducted due to national recommendations for restrictions regarding the COVID-19 pandemic (e.g. social distancing).  This format is felt to be most appropriate for this patient at this time.  All issues noted in this document were discussed and addressed.  No physical exam was performed (except for noted visual exam findings with Video Visits).   I connected with Theresa Reid today at  1:15 PM EST by telephone and verified that I am speaking with the correct person using two identifiers. Location patient: home Location provider: work Persons participating in the virtual visit: patient, provider  I discussed the limitations, risks, security and privacy concerns of performing an evaluation and management service by telephone and the availability of in person appointments. I also discussed with the patient that there may be a patient responsible charge related to this service. The patient expressed understanding and agreed to proceed.  Interactive audio and video telecommunications were attempted between this provider and patient, however failed, due to patient having technical difficulties OR patient did not have access to video capability.  We continued and completed visit with audio only.  Reason for visit: follow-up  HPI: Hypertension: Typically 120-135 over 40s-60 per patient report.  Taking amlodipine and torsemide.  No chest pain, shortness of breath, edema, or lightheadedness.  Hyperlipidemia: Taking Lipitor.  No right upper quadrant pain or myalgias.  Overactive bladder: Taking oxybutynin.  She is unsure if it is helpful as she has not tried discontinuing the medication.  She notes no urinary symptoms while taking this.  She notes no constipation or dry mouth.  She reports no side effects.  GERD: Notes the symptoms resolved when she stopped NSAIDs.  She does take Protonix.  No reflux, abdominal pain, blood in her stool, or  dysphagia.  History of breast cancer: She continues to follow with oncology, general surgery, and radiation oncology.  Her last radiation treatment is tomorrow per her report.  She is done quite well with her treatments.  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   ROS: See pertinent positives and negatives per HPI.  Past Medical History:  Diagnosis Date  . Arthritis   . Breast cancer ([place])   . Diverticulitis   . Family history of breast cancer   . GERD (gastroesophageal reflux disease)   . Hyperlipidemia   . Hypertension     Past Surgical History:  Procedure Laterality Date  . BREAST BIOPSY Right 05/28/2018   [place] bx, INVASIVE MAMMARY CARCINOMA WITH LOBULAR FEATURES  . BREAST LUMPECTOMY Right 06/12/2018   [facility], lobular features  . BREAST LUMPECTOMY WITH SENTINEL LYMPH NODE BIOPSY Right 06/12/2018   Procedure: RIGHT BREAST WIDE EXCISION WITH SENTINEL LYMPH NODE BX;  Surgeon: Robert Bellow, MD;  Location: [facility];  Service: General;  Laterality: Right;  . CHOLECYSTECTOMY    . [place]  . INCISION AND DRAINAGE OF WOUND Right 10/30/2018   Procedure: IRRIGATION AND DEBRIDEMENT RIGHT CHEST WALL WOUND;  Surgeon: Robert Bellow, MD;  Location: [facility];  Service: General;  Laterality: Right;  . JOINT REPLACEMENT    . MASTECTOMY WITH AXILLARY LYMPH NODE DISSECTION Right 09/20/2018   Procedure: MASTECTOMY WITH AXILLARY LYMPH NODE DISSECTION RIGHT;  Surgeon: Robert Bellow, MD;  Location: [facility];  Service: General;  Laterality: Right;  . ovaraian cyst removal Right    Cyst only (NOT OVARY)  . PORTACATH PLACEMENT Left  09/20/2018   Procedure: INSERTION PORT-A-CATH, LEFT;  Surgeon: Robert Bellow, MD;  Location: [facility];  Service: General;  Laterality: Left;  . TOTAL KNEE ARTHROPLASTY Bilateral 05/05/2010    Family History  Problem Relation Age of Onset  . Alcohol abuse Mother        deceased 65  . Diabetes  Father   . Breast cancer Maternal Aunt        dx 65s; deceased 78s  . Breast cancer Maternal Aunt        dx 26s; deceased 56s    SOCIAL HX: Non-smoker.   Current Outpatient Medications:  .  amLODipine (NORVASC) 5 MG tablet, TAKE 1 TABLET BY MOUTH  DAILY, Disp: 90 tablet, Rfl: 0 .  anastrozole (ARIMIDEX) 1 MG tablet, Take 1 tablet (1 mg total) by mouth daily. Do not start until completed radiation in Jan 2021, Disp: 30 tablet, Rfl: 2 .  atorvastatin (LIPITOR) 80 MG tablet, Take 1 tablet (80 mg total) by mouth daily., Disp: 90 tablet, Rfl: 1 .  CALCIUM-VITAMIN D PO, Take 1 tablet by mouth daily. , Disp: , Rfl:  .  CRANBERRY PO, Take 1 tablet by mouth 2 (two) times daily. , Disp: , Rfl:  .  Multiple Vitamins tablet, Take 1 tablet by mouth daily. , Disp: , Rfl:  .  Omega-3 Fatty Acids (FISH OIL PO), Take 1 capsule by mouth daily. , Disp: , Rfl:  .  oxybutynin (DITROPAN) 5 MG tablet, TAKE 1 TABLET BY MOUTH  TWICE DAILY, Disp: 180 tablet, Rfl: 0 .  pantoprazole (PROTONIX) 40 MG tablet, TAKE 1 TABLET BY MOUTH  DAILY, Disp: 90 tablet, Rfl: 0 .  phenazopyridine (PYRIDIUM) 200 MG tablet, Take 1 tablet (200 mg total) by mouth 3 (three) times daily as needed for pain., Disp: 30 tablet, Rfl: 0 .  potassium chloride (KLOR-CON) 10 MEQ tablet, TAKE 2 TABLETS(20 MEQ) BY MOUTH TWICE DAILY, Disp: 56 tablet, Rfl: 0 .  torsemide (DEMADEX) 5 MG tablet, TAKE 1 TABLET BY MOUTH  DAILY, Disp: 90 tablet, Rfl: 0 .  Turmeric 500 MG CAPS, Take 1,000 mg by mouth 2 (two) times daily. , Disp: , Rfl:   Current Facility-Administered Medications:  .  piperacillin-tazobactam (ZOSYN) IVPB 3.375 g, 3.375 g, Intravenous, Once, Byrnett, Forest Gleason, MD  EXAM: This is a telehealth telephone visit and thus no physical exam was completed.   ASSESSMENT AND PLAN:  Discussed the following assessment and plan:  Hypertension Patient reports low diastolics.  We will have her discontinue her torsemide.  She will stop her potassium  supplement as well.  We will recheck labs later this week.  She will continue amlodipine.  GERD (gastroesophageal reflux disease) Asymptomatic.  Discussed she can try tapering off of the Protonix by taking it every other day for a month and then discontinuing.  If she has recurrence of symptoms at any point she can go back to daily dosing.  HLD (hyperlipidemia) Continue Lipitor.  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  Neuropathy seems to be improving.  She will continue to follow with her specialists.  I advised that she contact us or them if she noticed any differences in her right chest wall or left breast.  Urge incontinence of urine Asymptomatic.  Discussed she could try stopping the oxybutynin if she would like and see if she has symptoms of overactive bladder.   Orders Placed This Encounter  Procedures  . Basic Metabolic Panel (BMET)  Standing Status:   Future    Standing Expiration Date:   05/04/2020    No orders of the defined types were placed in this encounter.   Patient wondered where she could get the Covid vaccine.  I checked with our clinical nurse and she noted that [facility] is offering it in [facility] otherwise the patient could get it through [facility] in [place].   I discussed the assessment and treatment plan with the patient. The patient was provided an opportunity to ask questions and all were answered. The patient agreed with the plan and demonstrated an understanding of the instructions.   The patient was advised to call back or seek an in-person evaluation if the symptoms worsen or if the condition fails to improve as anticipated.  I provided 21 minutes of non-face-to-face time during this encounter.   Tommi Rumps, MD

## 2019-05-05 NOTE — Assessment & Plan Note (Signed)
-  Continue Lipitor °

## 2019-05-05 NOTE — Assessment & Plan Note (Signed)
Asymptomatic.  Discussed she could try stopping the oxybutynin if she would like and see if she has symptoms of overactive bladder.

## 2019-05-06 ENCOUNTER — Other Ambulatory Visit: Payer: Self-pay

## 2019-05-06 ENCOUNTER — Ambulatory Visit
Admission: RE | Admit: 2019-05-06 | Discharge: 2019-05-06 | Disposition: A | Discharge status: Home / Self Care | Payer: Medicare Other | Source: Ambulatory Visit | Attending: Radiation Oncology | Admitting: Radiation Oncology

## 2019-05-06 DIAGNOSIS — Z170 Estrogen receptor positive status [ER+]: Secondary | ICD-10-CM | POA: Diagnosis not present

## 2019-05-06 DIAGNOSIS — C50411 Malignant neoplasm of upper-outer quadrant of right female breast: Secondary | ICD-10-CM | POA: Diagnosis not present

## 2019-05-06 DIAGNOSIS — C50911 Malignant neoplasm of unspecified site of right female breast: Secondary | ICD-10-CM | POA: Diagnosis not present

## 2019-05-06 DIAGNOSIS — C773 Secondary and unspecified malignant neoplasm of axilla and upper limb lymph nodes: Secondary | ICD-10-CM | POA: Diagnosis not present

## 2019-05-07 ENCOUNTER — Other Ambulatory Visit (INDEPENDENT_AMBULATORY_CARE_PROVIDER_SITE_OTHER): Payer: Medicare Other

## 2019-05-07 ENCOUNTER — Ambulatory Visit (INDEPENDENT_AMBULATORY_CARE_PROVIDER_SITE_OTHER): Payer: Medicare Other

## 2019-05-07 ENCOUNTER — Other Ambulatory Visit: Payer: Self-pay

## 2019-05-07 ENCOUNTER — Ambulatory Visit: Payer: Medicare Other

## 2019-05-07 ENCOUNTER — Encounter: Payer: Self-pay | Admitting: *Deleted

## 2019-05-07 VITALS — Ht 65.0 in | Wt 173.0 lb

## 2019-05-07 DIAGNOSIS — Z0000 Encounter for general adult medical examination without abnormal findings: Secondary | ICD-10-CM

## 2019-05-07 DIAGNOSIS — I10 Essential (primary) hypertension: Secondary | ICD-10-CM

## 2019-05-07 DIAGNOSIS — E785 Hyperlipidemia, unspecified: Secondary | ICD-10-CM

## 2019-05-07 NOTE — Progress Notes (Signed)
Subjective:   Theresa Reid is a 79 y.o. female who presents for Medicare Annual (Subsequent) preventive examination.  Review of Systems:  No ROS.  Medicare Wellness Virtual Visit.  Visual/audio telehealth visit, UTA vital signs.   Wt/Ht provided.  See social history for additional risk factors.   Cardiac Risk Factors include: advanced age (>76men, >70 women);hypertension     Objective:     Vitals: Ht 5\' 5"  (1.651 m)   Wt 173 lb (78.5 kg)   BMI 28.79 kg/m   Body mass index is 28.79 kg/m.  Advanced Directives 05/07/2019 04/21/2019 03/07/2019 02/17/2019 01/31/2019 01/20/2019 01/17/2019  Does Patient Have a Medical Advance Directive? Yes Yes No Yes No No No  Type of Paramedic of [place];Living will - - - [place];Living will [place];Living will [place];Living will  Does patient want to make changes to medical advance directive? No - Patient declined No - Patient declined No - Patient declined - - No - Patient declined No - Patient declined  Copy of [place] in Chart? No - copy requested - - - No - copy requested - No - copy requested  Would patient like information on creating a medical advance directive? - - No - Patient declined No - Patient declined No - Patient declined - No - Patient declined    Tobacco Social History   Tobacco Use  Smoking Status Never Smoker  Smokeless Tobacco Never Used     Counseling given: Not Answered   Clinical Intake:  Pre-visit preparation completed: Yes        Diabetes: No  How often do you need to have someone help you when you read instructions, pamphlets, or other written materials from your doctor or pharmacy?: 1 - Never  Interpreter Needed?: No     Past Medical History:  Diagnosis Date  . Arthritis   . Breast cancer ([place])   . Diverticulitis   . Family history of breast cancer   . GERD (gastroesophageal reflux  disease)   . Hyperlipidemia   . Hypertension    Past Surgical History:  Procedure Laterality Date  . BREAST BIOPSY Right 05/28/2018   [place] bx, INVASIVE MAMMARY CARCINOMA WITH LOBULAR FEATURES  . BREAST LUMPECTOMY Right 06/12/2018   [facility], lobular features  . BREAST LUMPECTOMY WITH SENTINEL LYMPH NODE BIOPSY Right 06/12/2018   Procedure: RIGHT BREAST WIDE EXCISION WITH SENTINEL LYMPH NODE BX;  Surgeon: Robert Bellow, MD;  Location: [facility] ORS;  Service: General;  Laterality: Right;  . CHOLECYSTECTOMY    . [place]  . INCISION AND DRAINAGE OF WOUND Right 10/30/2018   Procedure: IRRIGATION AND DEBRIDEMENT RIGHT CHEST WALL WOUND;  Surgeon: Robert Bellow, MD;  Location: [facility] ORS;  Service: General;  Laterality: Right;  . JOINT REPLACEMENT    . MASTECTOMY WITH AXILLARY LYMPH NODE DISSECTION Right 09/20/2018   Procedure: MASTECTOMY WITH AXILLARY LYMPH NODE DISSECTION RIGHT;  Surgeon: Robert Bellow, MD;  Location: [facility] ORS;  Service: General;  Laterality: Right;  . ovaraian cyst removal Right    Cyst only (NOT OVARY)  . PORTACATH PLACEMENT Left 09/20/2018   Procedure: INSERTION PORT-A-CATH, LEFT;  Surgeon: Robert Bellow, MD;  Location: [facility] ORS;  Service: General;  Laterality: Left;  . TOTAL KNEE ARTHROPLASTY Bilateral 05/05/2010   Family History  Problem Relation Age of Onset  . Alcohol abuse Mother        deceased 66  .  Diabetes Father   . Breast cancer Maternal Aunt        dx 13s; deceased 40s  . Breast cancer Maternal Aunt        dx 52s; deceased 53s   Social History   Socioeconomic History  . Marital status: Single    Spouse name: Not on file  . Number of children: Not on file  . Years of education: Not on file  . Highest education level: Not on file  Occupational History  . Occupation: retired  Tobacco Use  . Smoking status: Never Smoker  . Smokeless tobacco: Never Used  Substance and Sexual Activity  . Alcohol use: No    Alcohol/week: 0.0  standard drinks  . Drug use: No  . Sexual activity: Never  Other Topics Concern  . Not on file  Social History Narrative   Retired    Lives by herself    Pets: None   Caffeine- Coffee 2 cups daily, no tea/soda      Social Determinants of Health   Financial Resource Strain: Low Risk   . Difficulty of Paying Living Expenses: Not hard at all  Food Insecurity: No Food Insecurity  . Worried About Charity fundraiser in the Last Year: Never true  . Ran Out of Food in the Last Year: Never true  Transportation Needs: No Transportation Needs  . Lack of Transportation (Medical): No  . Lack of Transportation (Non-Medical): No  Physical Activity:   . Days of Exercise per Week: Not on file  . Minutes of Exercise per Session: Not on file  Stress: No Stress Concern Present  . Feeling of Stress : Not at all  Social Connections: Unknown  . Frequency of Communication with Friends and Family: More than three times a week  . Frequency of Social Gatherings with Friends and Family: More than three times a week  . Attends Religious Services: Not on file  . Active Member of Clubs or Organizations: Not on file  . Attends Archivist Meetings: Not on file  . Marital Status: Not on file    Outpatient Encounter Medications as of 05/07/2019  Medication Sig  . amLODipine (NORVASC) 5 MG tablet TAKE 1 TABLET BY MOUTH  DAILY  . anastrozole (ARIMIDEX) 1 MG tablet Take 1 tablet (1 mg total) by mouth daily. Do not start until completed radiation in Jan 2021  . atorvastatin (LIPITOR) 80 MG tablet Take 1 tablet (80 mg total) by mouth daily.  Marland Kitchen CALCIUM-VITAMIN D PO Take 1 tablet by mouth daily.   Marland Kitchen CRANBERRY PO Take 1 tablet by mouth 2 (two) times daily.   . Multiple Vitamins tablet Take 1 tablet by mouth daily.   . Omega-3 Fatty Acids (FISH OIL PO) Take 1 capsule by mouth daily.   Marland Kitchen oxybutynin (DITROPAN) 5 MG tablet TAKE 1 TABLET BY MOUTH  TWICE DAILY  . pantoprazole (PROTONIX) 40 MG tablet TAKE 1  TABLET BY MOUTH  DAILY  . phenazopyridine (PYRIDIUM) 200 MG tablet Take 1 tablet (200 mg total) by mouth 3 (three) times daily as needed for pain.  . Turmeric 500 MG CAPS Take 1,000 mg by mouth 2 (two) times daily.   . [DISCONTINUED] prochlorperazine (COMPAZINE) 10 MG tablet Take 1 tablet (10 mg total) by mouth every 6 (six) hours as needed (Nausea or vomiting).   No facility-administered encounter medications on file as of 05/07/2019.    Activities of Daily Living In your present state of health, do you have any  difficulty performing the following activities: 05/07/2019  Hearing? N  Vision? N  Difficulty concentrating or making decisions? N  Walking or climbing stairs? Y  Comment Unsteady gait, cane in use as needed  Dressing or bathing? N  Doing errands, shopping? Y  Comment She does not Physiological scientist and eating ? N  Using the Toilet? N  In the past six months, have you accidently leaked urine? Y  Comment Managed with daily liner  Do you have problems with loss of bowel control? N  Managing your Medications? N  Managing your Finances? N  Housekeeping or managing your Housekeeping? N  Some recent data might be hidden    Patient Care Team: Leone Haven, MD as PCP - General (Family Medicine)    Assessment:   This is a routine wellness examination for Mirelle.  Nurse connected with patient 05/07/19 at 10:30 AM EST by a telephone enabled telemedicine application and verified that I am speaking with the correct person using two identifiers. Patient stated full name and DOB. Patient gave permission to continue with virtual visit. Patient's location was at home and Nurse's location was at [place] office.   Patient is alert and oriented x3. Patient denies difficulty focusing or concentrating. Patient likes to read, watches documentaries and complete puzzles for brain stimulation.   Health Maintenance Due: -See completed HM at the end of note.   Eye: Visual acuity not  assessed. Virtual visit. Followed by their ophthalmologist.  Dental: Visits every 6 months.    Hearing: Demonstrates normal hearing during visit.  Safety:  Patient feels safe at home- yes Patient does have smoke detectors at home- yes Patient does wear sunscreen or protective clothing when in direct sunlight - yes Patient does wear seat belt when in a moving vehicle - yes Patient drives- no Adequate lighting in walkways free from debris- yes Grab bars and handrails used as appropriate- yes Ambulates with an assistive device- yes; cane as needed Cell phone on person when ambulating outside of the home- yes  Social: Alcohol intake - no   Smoking history- never   Smokers in home? none Illicit drug use? none  Medication: Taking as directed and without issues.  Pill box in use -yes  Self managed - yes  Covid-19: Precautions and sickness symptoms discussed. Wears mask, social distancing, hand hygiene as appropriate.   Activities of Daily Living Patient denies needing assistance with: household chores, feeding themselves, getting from bed to chair, getting to the toilet, bathing/showering, dressing, managing money, or preparing meals.   Discussed the importance of a healthy diet, water intake and the benefits of aerobic exercise.   Physical activity- active around the home.  Diet:  Regular; non dairy Water: good intake  Caffeine: 2 cups of coffee  Other Providers Patient Care Team: Leone Haven, MD as PCP - General (Family Medicine)  Exercise Activities and Dietary recommendations Current Exercise Habits: Home exercise routine  Goals      Patient Stated   . Maintain Healthy Lifestyle (pt-stated)       Fall Risk Fall Risk  05/07/2019 11/05/2018 10/29/2018 10/29/2018 10/22/2018  Falls in the past year? 0 0 0 0 0  Number falls in past yr: - - - 0 -  Injury with Fall? - - - 0 -  Follow up Falls evaluation completed;Falls prevention discussed - - - -  Timed Get Up  and Go performed: no, virtual visit  Depression Screen [facility] 2/9 Scores 05/07/2019 05/02/2018 04/25/2017 04/10/2017  PHQ - 2 Score 0 0 0 0     Cognitive Function MMSE - Mini Mental State Exam 05/02/2018 04/25/2017 02/11/2016  Orientation to time 5 5 5   Orientation to Place 5 5 5   Registration 3 3 3   Attention/ Calculation 5 5 5   Recall 3 3 3   Language- name 2 objects 2 2 2   Language- repeat 1 1 1   Language- follow 3 step command 3 3 3   Language- read & follow direction 1 1 1   Write a sentence 1 1 1   Copy design 1 1 1   Total score 30 30 30      6CIT Screen 05/07/2019  What Year? 0 points  What month? 0 points  What time? 0 points  Count back from 20 0 points  Months in reverse 0 points  Repeat phrase 0 points  Total Score 0    Immunization History  Administered Date(s) Administered  . Fluad Quad(high Dose 65+) 01/06/2019  . Influenza Split 12/28/2010  . Influenza, High Dose Seasonal PF 01/22/2018  . Influenza, Seasonal, Injecte, Preservative Fre 01/23/2012  . Influenza,inj,Quad PF,6+ Mos 02/06/2013, 02/03/2014  . Influenza,inj,quad, With Preservative 01/22/2017  . Influenza-Unspecified 12/15/2014, 01/12/2016, 12/30/2016  . Pneumococcal Conjugate-13 02/11/2016, 03/01/2017  . Pneumococcal Polysaccharide-23 01/04/2010, 11/08/2012  . Pneumococcal-Unspecified 01/22/2017  . Tdap 01/22/2017  . Zoster 09/22/2008  . Zoster Recombinat (Shingrix) 03/01/2017   Screening Tests Health Maintenance  Topic Date Due  . TETANUS/TDAP  01/23/2027  . INFLUENZA VACCINE  Completed  . DEXA SCAN  Completed  . PNA vac Low Risk Adult  Completed     Plan:   Keep all routine maintenance appointments.   Next scheduled lab 05/07/19 @ 2:30  Nurse Visit 05/07/19 @ 2:00  Follow up 11/03/19 @ 1:15  Medicare Attestation I have personally reviewed: The patient's medical and social history Their use of alcohol, tobacco or illicit drugs Their current medications and supplements The patient's functional  ability including ADLs,fall risks, home safety risks, cognitive, and hearing and visual impairment Diet and physical activities Evidence for depression   I have reviewed and discussed with patient certain preventive protocols, quality metrics, and best practice recommendations.   Varney Biles, LPN  [phone]

## 2019-05-07 NOTE — Patient Instructions (Addendum)
  Theresa Reid , Thank you for taking time to come for your Medicare Wellness Visit. I appreciate your ongoing commitment to your health goals. Please review the following plan we discussed and let me know if I can assist you in the future.   These are the goals we discussed: Goals      Patient Stated   . Maintain Healthy Lifestyle (pt-stated)       This is a list of the screening recommended for you and due dates:  Health Maintenance  Topic Date Due  . Tetanus Vaccine  01/23/2027  . Flu Shot  Completed  . DEXA scan (bone density measurement)  Completed  . Pneumonia vaccines  Completed

## 2019-05-08 LAB — BASIC METABOLIC PANEL
BUN: 9 mg/dL (ref 6–23)
CO2: 28 mEq/L (ref 19–32)
Calcium: 9 mg/dL (ref 8.4–10.5)
Chloride: 104 mEq/L (ref 96–112)
Creatinine, Ser: 0.48 mg/dL (ref 0.40–1.20)
GFR: 124.43 mL/min (ref >60.00)
Glucose, Bld: 122 mg/dL — ABNORMAL HIGH (ref 70–99)
Potassium: 4 mEq/L (ref 3.5–5.1)
Sodium: 140 mEq/L (ref 135–145)

## 2019-05-12 ENCOUNTER — Ambulatory Visit
Admission: RE | Admit: 2019-05-12 | Discharge: 2019-05-12 | Disposition: A | Discharge status: Home / Self Care | Payer: Medicare Other | Source: Ambulatory Visit | Attending: General Surgery | Admitting: General Surgery

## 2019-05-12 DIAGNOSIS — Z1231 Encounter for screening mammogram for malignant neoplasm of breast: Secondary | ICD-10-CM | POA: Diagnosis not present

## 2019-05-13 ENCOUNTER — Other Ambulatory Visit: Payer: Self-pay | Admitting: General Surgery

## 2019-05-13 DIAGNOSIS — R928 Other abnormal and inconclusive findings on diagnostic imaging of breast: Secondary | ICD-10-CM

## 2019-05-13 DIAGNOSIS — R921 Mammographic calcification found on diagnostic imaging of breast: Secondary | ICD-10-CM

## 2019-05-14 ENCOUNTER — Telehealth: Payer: Self-pay

## 2019-05-14 DIAGNOSIS — K219 Gastro-esophageal reflux disease without esophagitis: Secondary | ICD-10-CM

## 2019-05-14 DIAGNOSIS — I10 Essential (primary) hypertension: Secondary | ICD-10-CM

## 2019-05-14 DIAGNOSIS — N3941 Urge incontinence: Secondary | ICD-10-CM

## 2019-05-14 MED ORDER — OXYBUTYNIN CHLORIDE 5 MG PO TABS: 5.0000 mg | ORAL_TABLET | Freq: Two times a day (BID) | ORAL | 1 refills | Status: DC

## 2019-05-14 MED ORDER — AMLODIPINE BESYLATE 5 MG PO TABS: 5.0000 mg | ORAL_TABLET | Freq: Every day | ORAL | 1 refills | Status: DC

## 2019-05-14 MED ORDER — PANTOPRAZOLE SODIUM 40 MG PO TBEC: 40.0000 mg | DELAYED_RELEASE_TABLET | Freq: Every day | ORAL | 1 refills | Status: DC

## 2019-05-14 NOTE — Telephone Encounter (Signed)
Arefill request came in for the patient for oxybutan, norvasc and protonix and this was refilled for the ppatient.  [name],cma

## 2019-05-15 NOTE — Telephone Encounter (Signed)
I called to [facility] and informed them that the medication Dermadex has been dicontinued and should not be sent again, the representative I spoke to understood and stated she would mark the medication discontinued per the provider.  Theresa Reid,cma

## 2019-05-15 NOTE — Telephone Encounter (Signed)
Please confirm that this is supposed to read demadex. If that is the case I discontinued that medication at her most recent visit and she should not need it refilled. Thanks.

## 2019-05-21 ENCOUNTER — Ambulatory Visit: Payer: Medicare Other | Attending: Oncology | Admitting: Occupational Therapy

## 2019-05-21 ENCOUNTER — Other Ambulatory Visit: Payer: Self-pay

## 2019-05-21 DIAGNOSIS — I972 Postmastectomy lymphedema syndrome: Secondary | ICD-10-CM

## 2019-05-21 NOTE — Therapy (Signed)
[facility] [address] [place], [address] Phone: [phone]   Fax:  [phone]  Occupational Therapy Treatment  Patient Details  Name: Theresa Reid MRN: [id number] Date of Birth: 11-13-1940 Referring Provider (OT): Avon Gully   Encounter Date: 05/21/2019  OT End of Session - 05/21/19 1516    Visit Number  8    Number of Visits  10    Date for OT Re-Evaluation  06/30/19    OT Start Time  1439    OT Stop Time  1515    OT Time Calculation (min)  36 min    Activity Tolerance  Patient tolerated treatment well    Behavior During Therapy  [facility] for tasks assessed/performed       Past Medical History:  Diagnosis Date  . Arthritis   . Breast cancer ([place])   . Diverticulitis   . Family history of breast cancer   . GERD (gastroesophageal reflux disease)   . Hyperlipidemia   . Hypertension     Past Surgical History:  Procedure Laterality Date  . BREAST BIOPSY Right 05/28/2018   [place] bx, INVASIVE MAMMARY CARCINOMA WITH LOBULAR FEATURES  . BREAST LUMPECTOMY Right 06/12/2018   [facility], lobular features  . BREAST LUMPECTOMY WITH SENTINEL LYMPH NODE BIOPSY Right 06/12/2018   Procedure: RIGHT BREAST WIDE EXCISION WITH SENTINEL LYMPH NODE BX;  Surgeon: Robert Bellow, MD;  Location: [facility] ORS;  Service: General;  Laterality: Right;  . CHOLECYSTECTOMY    . [place]  . INCISION AND DRAINAGE OF WOUND Right 10/30/2018   Procedure: IRRIGATION AND DEBRIDEMENT RIGHT CHEST WALL WOUND;  Surgeon: Robert Bellow, MD;  Location: [facility] ORS;  Service: General;  Laterality: Right;  . JOINT REPLACEMENT    . MASTECTOMY WITH AXILLARY LYMPH NODE DISSECTION Right 09/20/2018   Procedure: MASTECTOMY WITH AXILLARY LYMPH NODE DISSECTION RIGHT;  Surgeon: Robert Bellow, MD;  Location: [facility] ORS;  Service: General;  Laterality: Right;  . ovaraian cyst removal Right    Cyst only (NOT OVARY)  . PORTACATH PLACEMENT  Left 09/20/2018   Procedure: INSERTION PORT-A-CATH, LEFT;  Surgeon: Robert Bellow, MD;  Location: [facility] ORS;  Service: General;  Laterality: Left;  . TOTAL KNEE ARTHROPLASTY Bilateral 05/05/2010    There were no vitals filed for this visit.  Subjective Assessment - 05/21/19 1514    Subjective   I picked up my new sleeve and they had 3 gloves - but my hand goes numb and I cannot get the sleeve all the way up - it buddles at the wrist    Pertinent History  Patient had her original lumpectomy and sentinel lymph node biopsy back in February 2020 R breast   She then had an MRI which showed more areas of enhancement and was taken back to the OR for mastectomy and axillary lymph node dissection on 09/20/2018 which showed 15 of the lymph nodes were also positive for malignancy and this upstaged her to stage IIIa disease.  Plan was to offer her adjuvant chemotherapy following this.  However her mastectomy course was complicated by flap necrosis requiring debridement antibiotics and she had to be also taken to the OR on 10/30/2018.  Her wound has not completely healed yet and she still has a drain in place.  She seen  Dr. Dahlia Byes on 11/18/2018.  There is concern for early cellulitis of her chest wall.  Dr. Dahlia Byes after his visit started  patient on  Augmentin for 2 more weeks which further delays her chemotherapy. Lymphedema in R UE per pt developed after surgery on 10/30/2018 - stayed the same - not going down - denies pain    Patient Stated Goals  I want to get sleeve so my arm don't  get big and to  prevent infection    Currently in Pain?  No/denies          LYMPHEDEMA/ONCOLOGY QUESTIONNAIRE - 05/21/19 1442      Right Upper Extremity Lymphedema   15 cm Proximal to Olecranon Process  36.8 cm    10 cm Proximal to Olecranon Process  36 cm    Olecranon Process  25 cm    15 cm Proximal to Ulnar Styloid Process  22.2 cm    10 cm Proximal to Ulnar Styloid Process  19.5 cm    Just Proximal to Ulnar Styloid  Process  15 cm    Across Hand at [employer]  19.4 cm    At [place] of 2nd Digit  6 cm    At [facility] of Thumb  6 cm      Left Upper Extremity Lymphedema   15 cm Proximal to Olecranon Process  36 cm    10 cm Proximal to Olecranon Process  36.2 cm    Olecranon Process  25.5 cm    15 cm Proximal to Ulnar Styloid Process  20.2 cm    10 cm Proximal to Ulnar Styloid Process  17.5 cm    Just Proximal to Ulnar Styloid Process  14 cm    Across Hand at [employer]  17.2 cm    At [place] of 2nd Digit  5.5 cm    At [facility] of Thumb  5.5 cm       Pt's measurements increase at hand , wrist and upper arm - pt new compression sleeve that she got last week after loosing weight last year end - and compression sleeve not containing her  Assess fit for R UE Jobst Elvarex soft sleeve - it is to large at upper arm - bubbling and forearm to tight at wrist and distal forearm and to long from elbow to wrist. Causing some tourniquet and numbness in R hand  Contact Teresa at [employer] to measure pt again for better fitting garment. And pt to contact me if she gets new sleeve.                 OT Education - 05/21/19 1515    Education Details  Pt measuremenst increase in upper arm and wrist/hand - need measure again - sleeve not fitted correct    Person(s) Educated  Patient    Methods  Explanation;Demonstration;Tactile cues;Verbal cues;Handout    Comprehension  Returned demonstration;Verbalized understanding       OT Short Term Goals - 01/14/19 1557      OT SHORT TERM GOAL #1   Title  Pt's R UE circumference decrease by at least 1cm -2 cm from hand to upper arm - to be fitted for appropriate compression garments    Status  Achieved        OT Long Term Goals - 01/14/19 1557      OT LONG TERM GOAL #1   Title  Pt to be fitted wth correct compression for R UE and if indiicated chest to decrease lymphedema and prevent cellulitis    Status  Achieved      OT LONG TERM GOAL #2   Title  Pt  maintain  her L UE lymphedema  circumference with correct compression garments thru out her CA treatment    Baseline  pt half way with chemo - reassess end of chemo - ? if need radiation    Time  12    Period  Weeks    Status  New    [place] Date  03/11/19            Plan - 05/21/19 1517    Clinical Impression Statement  Pt arrive with her new compression Jobst Elvarex soft sleeve and glove on because of loosing weight last year end - pt compression sleeve was no containing her - but the new sleeve is to large on upper arm , and forearm is to long from wrist to elbow and to tight - pt having hard time pulling it up and causing tourniquet at wrist and causing edema and numbness in R hand - contact Teresa at [employer] to remeasure pt please again for new sleeve that fits correctly - pt to call when new sleeve comes in    OT Occupational Profile and History  Problem Focused Assessment - Including review of records relating to presenting problem    Occupational performance deficits (Please refer to evaluation for details):  ADL's;Play;Leisure;IADL's    Body Structure / Function / Physical Skills  ADL;UE functional use;Scar mobility;Skin integrity;Edema    Rehab Potential  Good    Clinical Decision Making  Several treatment options, min-mod task modification necessary    Comorbidities Affecting Occupational Performance:  May have comorbidities impacting occupational performance    Modification or Assistance to Complete Evaluation   No modification of tasks or assist necessary to complete eval    OT Frequency  --   when compression comes in   OT Duration  4 weeks    OT Treatment/Interventions  Self-care/ADL training;Manual lymph drainage;Therapeutic exercise;Patient/family education;Scar mobilization;Manual Therapy    Plan  check on new comrpession sleeve fit    OT Home Exercise Plan  see pt instruction    Consulted and Agree with Plan of Care  Patient       Patient will benefit from skilled  therapeutic intervention in order to improve the following deficits and impairments:   Body Structure / Function / Physical Skills: ADL, UE functional use, Scar mobility, Skin integrity, Edema       Visit Diagnosis: Postmastectomy lymphedema syndrome    Problem List Patient Active Problem List   Diagnosis Date Noted  . Infected skin flap 10/09/2018  . Cataract 09/24/2018  . Urge incontinence of urine 09/24/2018  . Malignant neoplasm of right female breast ([place]) 06/07/2018  . Goals of care, counseling/discussion 06/07/2018  . Family history of breast cancer   . Dairy product intolerance 01/14/2018  . Seborrheic keratosis 04/10/2017  . Dysuria 02/19/2017  . Visual disturbance 10/03/2016  . GERD (gastroesophageal reflux disease) 09/14/2016  . HLD (hyperlipidemia) 08/13/2015  . Hypertension 11/13/2014  . 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    Rosalyn Gess OTR/L,CLT 05/21/2019, 3:21 PM  [facility] [address], [place], [address] Phone: [phone]   Fax:  [phone]  Name: Theresa Reid MRN: [id number] Date of Birth: 14-Mar-1941

## 2019-05-21 NOTE — Patient Instructions (Signed)
Will contact Teresa at [facility] to measure pt again for new sleeve

## 2019-05-22 ENCOUNTER — Ambulatory Visit: Payer: Medicare Other

## 2019-05-23 ENCOUNTER — Other Ambulatory Visit: Payer: Self-pay | Admitting: Oncology

## 2019-05-27 ENCOUNTER — Ambulatory Visit
Admission: RE | Admit: 2019-05-27 | Discharge: 2019-05-27 | Disposition: A | Discharge status: Home / Self Care | Payer: Medicare Other | Source: Ambulatory Visit | Attending: General Surgery | Admitting: General Surgery

## 2019-05-27 DIAGNOSIS — R928 Other abnormal and inconclusive findings on diagnostic imaging of breast: Secondary | ICD-10-CM | POA: Insufficient documentation

## 2019-05-27 DIAGNOSIS — R921 Mammographic calcification found on diagnostic imaging of breast: Secondary | ICD-10-CM | POA: Diagnosis not present

## 2019-05-27 HISTORY — DX: Personal history of irradiation: Z92.3

## 2019-05-27 HISTORY — DX: Personal history of antineoplastic chemotherapy: Z92.21

## 2019-05-27 NOTE — Telephone Encounter (Signed)
We received dental clearance today from patient's dentist. Please see Media.

## 2019-05-27 NOTE — Telephone Encounter (Signed)
error 

## 2019-05-28 ENCOUNTER — Ambulatory Visit: Payer: Medicare Other

## 2019-05-28 ENCOUNTER — Telehealth: Payer: Self-pay | Admitting: *Deleted

## 2019-05-28 NOTE — Telephone Encounter (Signed)
Called pt to let her know that we did get permission that all her teeth are fixed and she is now approved to have zometa and she is coming 2/8 for labs and see md and we have added on Zometa if she is ok with it. She said she was getting ready to call dentist to check about the letter and is saves her the call and she will be fine with getting zometa when she comes in next week

## 2019-06-02 ENCOUNTER — Inpatient Hospital Stay: Payer: Medicare Other | Attending: Oncology | Admitting: Oncology

## 2019-06-02 ENCOUNTER — Encounter: Payer: Self-pay | Admitting: Oncology

## 2019-06-02 ENCOUNTER — Other Ambulatory Visit: Payer: Self-pay

## 2019-06-02 ENCOUNTER — Inpatient Hospital Stay: Payer: Medicare Other

## 2019-06-02 VITALS — BP 130/56 | HR 80 | Temp 98.3°F | Ht 65.0 in | Wt 175.0 lb

## 2019-06-02 DIAGNOSIS — Z79811 Long term (current) use of aromatase inhibitors: Secondary | ICD-10-CM | POA: Diagnosis not present

## 2019-06-02 DIAGNOSIS — Z79899 Other long term (current) drug therapy: Secondary | ICD-10-CM | POA: Insufficient documentation

## 2019-06-02 DIAGNOSIS — T451X5A Adverse effect of antineoplastic and immunosuppressive drugs, initial encounter: Secondary | ICD-10-CM | POA: Diagnosis not present

## 2019-06-02 DIAGNOSIS — Z7983 Long term (current) use of bisphosphonates: Secondary | ICD-10-CM | POA: Diagnosis not present

## 2019-06-02 DIAGNOSIS — Z170 Estrogen receptor positive status [ER+]: Secondary | ICD-10-CM

## 2019-06-02 DIAGNOSIS — Z08 Encounter for follow-up examination after completed treatment for malignant neoplasm: Secondary | ICD-10-CM | POA: Diagnosis not present

## 2019-06-02 DIAGNOSIS — C50911 Malignant neoplasm of unspecified site of right female breast: Secondary | ICD-10-CM | POA: Diagnosis not present

## 2019-06-02 DIAGNOSIS — G620 Drug-induced polyneuropathy: Secondary | ICD-10-CM | POA: Insufficient documentation

## 2019-06-02 DIAGNOSIS — Z9011 Acquired absence of right breast and nipple: Secondary | ICD-10-CM | POA: Diagnosis not present

## 2019-06-02 DIAGNOSIS — Z5181 Encounter for therapeutic drug level monitoring: Secondary | ICD-10-CM

## 2019-06-02 DIAGNOSIS — Z9221 Personal history of antineoplastic chemotherapy: Secondary | ICD-10-CM | POA: Diagnosis not present

## 2019-06-02 DIAGNOSIS — Z95828 Presence of other vascular implants and grafts: Secondary | ICD-10-CM

## 2019-06-02 DIAGNOSIS — Z853 Personal history of malignant neoplasm of breast: Secondary | ICD-10-CM | POA: Diagnosis not present

## 2019-06-02 LAB — CBC WITH DIFFERENTIAL/PLATELET
Abs Immature Granulocytes: 0.01 10*3/uL (ref 0.00–0.07)
Basophils Absolute: 0 10*3/uL (ref 0.0–0.1)
Basophils Relative: 0 %
Eosinophils Absolute: 0.2 10*3/uL (ref 0.0–0.5)
Eosinophils Relative: 3 %
HCT: 40.8 % (ref 36.0–46.0)
Hemoglobin: 12.7 g/dL (ref 12.0–15.0)
Immature Granulocytes: 0 %
Lymphocytes Relative: 28 %
Lymphs Abs: 1.7 10*3/uL (ref 0.7–4.0)
MCH: 28.9 pg (ref 26.0–34.0)
MCHC: 31.1 g/dL (ref 30.0–36.0)
MCV: 92.7 fL (ref 80.0–100.0)
Monocytes Absolute: 0.7 10*3/uL (ref 0.1–1.0)
Monocytes Relative: 11 %
Neutro Abs: 3.6 10*3/uL (ref 1.7–7.7)
Neutrophils Relative %: 58 %
Platelets: 238 10*3/uL (ref 150–400)
RBC: 4.4 MIL/uL (ref 3.87–5.11)
RDW: 14.4 % (ref 11.5–15.5)
WBC: 6.3 10*3/uL (ref 4.0–10.5)
nRBC: 0 % (ref 0.0–0.2)

## 2019-06-02 LAB — COMPREHENSIVE METABOLIC PANEL
ALT: 14 U/L (ref 0–44)
AST: 18 U/L (ref 15–41)
Albumin: 3.6 g/dL (ref 3.5–5.0)
Alkaline Phosphatase: 96 U/L (ref 38–126)
Anion gap: 10 (ref 5–15)
BUN: 7 mg/dL — ABNORMAL LOW (ref 8–23)
CO2: 24 mmol/L (ref 22–32)
Calcium: 8.7 mg/dL — ABNORMAL LOW (ref 8.9–10.3)
Chloride: 105 mmol/L (ref 98–111)
Creatinine, Ser: 0.34 mg/dL — ABNORMAL LOW (ref 0.44–1.00)
GFR calc Af Amer: >60 mL/min (ref >60)
GFR calc non Af Amer: >60 mL/min (ref >60)
Glucose, Bld: 104 mg/dL — ABNORMAL HIGH (ref 70–99)
Potassium: 3.9 mmol/L (ref 3.5–5.1)
Sodium: 139 mmol/L (ref 135–145)
Total Bilirubin: 0.5 mg/dL (ref 0.3–1.2)
Total Protein: 6.9 g/dL (ref 6.5–8.1)

## 2019-06-02 MED ORDER — HEPARIN SOD (PORK) LOCK FLUSH 100 UNIT/ML IV SOLN
500.0000 [IU] | Freq: Once | INTRAVENOUS | Status: AC
  Administered 2019-06-02: 500 [IU] via INTRAVENOUS
  Filled 2019-06-02: qty 5

## 2019-06-02 MED ORDER — ZOLEDRONIC ACID 4 MG/100ML IV SOLN
4.0000 mg | Freq: Once | INTRAVENOUS | Status: AC
  Administered 2019-06-02: 10:00:00 4 mg via INTRAVENOUS
  Filled 2019-06-02: qty 100

## 2019-06-02 MED ORDER — SODIUM CHLORIDE 0.9% FLUSH
10.0000 mL | Freq: Once | INTRAVENOUS | Status: AC
  Administered 2019-06-02: 10 mL via INTRAVENOUS
  Filled 2019-06-02: qty 10

## 2019-06-02 MED ORDER — SODIUM CHLORIDE 0.9 % IV SOLN
Freq: Once | INTRAVENOUS | Status: AC
  Filled 2019-06-02: qty 250

## 2019-06-02 NOTE — Progress Notes (Signed)
Patient stated that she had been doing well with no complaints. 

## 2019-06-03 NOTE — Progress Notes (Signed)
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 Patient underwent lumpectomy and sentinel lymph node biopsy in February 2020. Pathology showed invasive lobular carcinoma with positive medial margin which was reexcised with still positive. Tumor was 24 mm, grade 2 ER PR positive and HER-2/neu negative. One sentinel lymph node that was excised was positive for macro met of 8 mm. No extranodal extension was present. MammaPrint came back as high risk with an average 10-year risk of untreated disease is 29%. Patient then underwent a bilateral MRI which showed additional suspicious non-mass enhancement along the inferior margin of the biopsy cavity extending 2 cm highly suspicious for continuous disease and another highly suspicious linear non-mass enhancement at the base of the nipple concerning for multifocal multicentric disease. Patient underwent right mastectomy and axillary lymph node dissectionon 09/20/2018. There was no residual invasive mammary carcinoma within the breast. However 15 out of 20 lymph nodes were involved with metastatic  carcinoma measuring up to 6 mm and remaining 5 lymph nodes with isolated tumor cells. Pathologic stage was [id number]  Despite having surgery in May 2020 which was a second surgery-patient has not been able to start adjuvant chemotherapy. Her mastectomy was complicated by flap necrosis requiring debridement antibiotics and she had to be taken back to the OR for the same. Given her age and postmastectomy complications and delayed wound healing-weekly Taxol x12 cycles was chosen as the regimen for her adjuvant chemotherapywhich she completed on 02/25/2019.  Patient started Arimidex in December 2020 and also received dental clearance to start Zometa.  She completed adjuvant radiation treatment  Interval history-reports tolerating Arimidex well without any significant side effects.  She does have some peripheral neuropathy in her hands and feet which she reports is manageable at present.  ECOG PS- 1 Pain scale- 0  Review of systems- Review of Systems  Constitutional: Negative for chills, fever, malaise/fatigue and weight loss.  HENT: Negative for congestion, ear discharge and nosebleeds.   Eyes: Negative for blurred vision.  Respiratory: Negative for cough, hemoptysis, sputum production, shortness of breath and wheezing.   Cardiovascular: Negative for chest pain, palpitations, orthopnea and claudication.  Gastrointestinal: Negative for abdominal pain, blood in stool, constipation, diarrhea, heartburn, melena, nausea and vomiting.  Genitourinary: Negative for dysuria, flank pain, frequency, hematuria and urgency.  Musculoskeletal: Negative for back pain, joint pain and myalgias.  Skin: Negative for rash.  Neurological: Positive for sensory change (Peripheral neuropathy). Negative for dizziness, tingling, focal weakness, seizures, weakness and headaches.  Endo/Heme/Allergies: Does not bruise/bleed easily.  Psychiatric/Behavioral: Negative for depression and suicidal ideas. The patient does not  have insomnia.       No Known Allergies   Past Medical History:  Diagnosis Date  . Arthritis   .  Breast cancer ([place])   . Diverticulitis   . Family history of breast cancer   . GERD (gastroesophageal reflux disease)   . Hyperlipidemia   . Hypertension   . Personal history of chemotherapy   . Personal history of radiation therapy      Past Surgical History:  Procedure Laterality Date  . BREAST BIOPSY Right 05/28/2018   [place] bx, INVASIVE MAMMARY CARCINOMA WITH LOBULAR FEATURES  . BREAST LUMPECTOMY Right 06/12/2018   [facility], lobular features  . BREAST LUMPECTOMY WITH SENTINEL LYMPH NODE BIOPSY Right 06/12/2018   Procedure: RIGHT BREAST WIDE EXCISION WITH SENTINEL LYMPH NODE BX;  Surgeon: Robert Bellow, MD;  Location: [facility] ORS;  Service: General;  Laterality: Right;  . CHOLECYSTECTOMY    . [place]  . INCISION AND DRAINAGE OF WOUND Right 10/30/2018   Procedure: IRRIGATION AND DEBRIDEMENT RIGHT CHEST WALL WOUND;  Surgeon: Robert Bellow, MD;  Location: [facility] ORS;  Service: General;  Laterality: Right;  . JOINT REPLACEMENT    . MASTECTOMY Right   . MASTECTOMY WITH AXILLARY LYMPH NODE DISSECTION Right 09/20/2018   Procedure: MASTECTOMY WITH AXILLARY LYMPH NODE DISSECTION RIGHT;  Surgeon: Robert Bellow, MD;  Location: [facility] ORS;  Service: General;  Laterality: Right;  . ovaraian cyst removal Right    Cyst only (NOT OVARY)  . PORTACATH PLACEMENT Left 09/20/2018   Procedure: INSERTION PORT-A-CATH, LEFT;  Surgeon: Robert Bellow, MD;  Location: [facility] ORS;  Service: General;  Laterality: Left;  . TOTAL KNEE ARTHROPLASTY Bilateral 05/05/2010    Social History   Socioeconomic History  . Marital status: Single    Spouse name: Not on file  . Number of children: Not on file  . Years of education: Not on file  . Highest education level: Not on file  Occupational History  . Occupation: retired  Tobacco Use  . Smoking status: Never Smoker  . Smokeless tobacco:  Never Used  Substance and Sexual Activity  . Alcohol use: No    Alcohol/week: 0.0 standard drinks  . Drug use: No  . Sexual activity: Never  Other Topics Concern  . Not on file  Social History Narrative   Retired    Lives by herself    Pets: None   Caffeine- Coffee 2 cups daily, no tea/soda      Social Determinants of Health   Financial Resource Strain: Low Risk   . Difficulty of Paying Living Expenses: Not hard at all  Food Insecurity: No Food Insecurity  . Worried About Charity fundraiser in the Last Year: Never true  . Ran Out of Food in the Last Year: Never true  Transportation Needs: No Transportation Needs  . Lack of Transportation (Medical): No  . Lack of Transportation (Non-Medical): No  Physical Activity:   . Days of Exercise per Week: Not on file  . Minutes of Exercise per Session: Not on file  Stress: No Stress Concern Present  . Feeling of Stress : Not at all  Social Connections: Unknown  . Frequency of Communication with Friends and Family: More than three times a week  . Frequency of Social Gatherings with Friends and Family: More than three times a week  . Attends Religious Services: Not on file  . Active Member of Clubs or Organizations: Not on file  . Attends Archivist Meetings: Not on file  . Marital Status: Not on file  Intimate Partner Violence: Not At Risk  . Fear of Current or Ex-Partner:  No  . Emotionally Abused: No  . Physically Abused: No  . Sexually Abused: No    Family History  Problem Relation Age of Onset  . Alcohol abuse Mother        deceased 57  . Diabetes Father   . Breast cancer Maternal Aunt        dx 69s; deceased 59s  . Breast cancer Maternal Aunt        dx 57s; deceased 94s     Current Outpatient Medications:  .  amLODipine (NORVASC) 5 MG tablet, Take 1 tablet (5 mg total) by mouth daily., Disp: 90 tablet, Rfl: 1 .  anastrozole (ARIMIDEX) 1 MG tablet, Take 1 tablet (1 mg total) by mouth daily. Do not start  until completed radiation in Jan 2021, Disp: 30 tablet, Rfl: 2 .  atorvastatin (LIPITOR) 80 MG tablet, Take 1 tablet (80 mg total) by mouth daily., Disp: 90 tablet, Rfl: 1 .  CALCIUM-VITAMIN D PO, Take 1 tablet by mouth daily. , Disp: , Rfl:  .  Multiple Vitamins tablet, Take 1 tablet by mouth daily. , Disp: , Rfl:  .  Omega-3 Fatty Acids (FISH OIL PO), Take 1 capsule by mouth daily. , Disp: , Rfl:  .  oxybutynin (DITROPAN) 5 MG tablet, Take 1 tablet (5 mg total) by mouth 2 (two) times daily., Disp: 180 tablet, Rfl: 1 .  Turmeric 500 MG CAPS, Take 1,000 mg by mouth 2 (two) times daily. , Disp: , Rfl:  .  CRANBERRY PO, Take 1 tablet by mouth 2 (two) times daily. , Disp: , Rfl:   Physical exam:  Vitals:   06/02/19 0910  BP: (!) 130/56  Pulse: 80  Temp: 98.3 F (36.8 C)  TempSrc: Tympanic  Weight: 175 lb (79.4 kg)  Height: '5\' 5"'  (1.651 m)   Physical Exam HENT:     Head: Normocephalic and atraumatic.  Eyes:     Pupils: Pupils are equal, round, and reactive to light.  Cardiovascular:     Rate and Rhythm: Normal rate and regular rhythm.     Heart sounds: Normal heart sounds.  Pulmonary:     Effort: Pulmonary effort is normal.     Breath sounds: Normal breath sounds.  Abdominal:     General: Bowel sounds are normal.     Palpations: Abdomen is soft.  Musculoskeletal:     Cervical back: Normal range of motion.     Comments: Sleeve in place over right upper extremity  Skin:    General: Skin is warm and dry.  Neurological:     Mental Status: She is alert and oriented to person, place, and time.      CMP Latest Ref Rng & Units 06/02/2019  Glucose 70 - 99 mg/dL 104(H)  BUN 8 - 23 mg/dL 7(L)  Creatinine 0.44 - 1.00 mg/dL 0.34(L)  Sodium 135 - 145 mmol/L 139  Potassium 3.5 - 5.1 mmol/L 3.9  Chloride 98 - 111 mmol/L 105  CO2 22 - 32 mmol/L 24  Calcium 8.9 - 10.3 mg/dL 8.7(L)  Total Protein 6.5 - 8.1 g/dL 6.9  Total Bilirubin 0.3 - 1.2 mg/dL 0.5  Alkaline Phos 38 - 126 U/L 96    AST 15 - 41 U/L 18  ALT 0 - 44 U/L 14   CBC Latest Ref Rng & Units 06/02/2019  WBC 4.0 - 10.5 K/uL 6.3  Hemoglobin 12.0 - 15.0 g/dL 12.7  Hematocrit 36.0 - 46.0 % 40.8  Platelets 150 - 400 K/uL 238  No images are attached to the encounter.  MM Digital Diagnostic Unilat L  Result Date: 05/27/2019 CLINICAL DATA:  79 year old patient recalled from recent screening mammogram for evaluation of left breast calcifications. Patient has a personal history of right breast cancer diagnosed in 2020 and right mastectomy. EXAM: DIGITAL DIAGNOSTIC RIGHT MAMMOGRAM COMPARISON:  Previous exam(s). ACR Breast Density Category b: There are scattered areas of fibroglandular density. FINDINGS: Magnification views of the upper central left breast, anterior third, show a 3.5 x 2.5 x 2.5 mm group of coarse likely benign dystrophic calcifications. There is no associated mass. IMPRESSION: Probably benign dystrophic calcifications in the left breast. RECOMMENDATION: The patient and I discussed in detail the options of diagnostic left mammogram in 6 months to evaluate the calcifications versus stereotactic biopsy for pathologic diagnosis. At this point, she prefers imaging follow-up. Diagnostic left mammogram is recommended in 6 months. I have discussed the findings and recommendations with the patient. If applicable, a reminder letter will be sent to the patient regarding the next appointment. BI-RADS CATEGORY  3: Probably benign. Electronically Signed   By: Curlene Dolphin M.D.   On: 05/27/2019 11:29   MM 3D SCREEN BREAST UNI LEFT  Result Date: 05/13/2019 CLINICAL DATA:  Screening. EXAM: DIGITAL SCREENING UNILATERAL LEFT MAMMOGRAM WITH CAD AND TOMO COMPARISON:  Previous exam(s). ACR Breast Density Category a: The breast tissue is almost entirely fatty. FINDINGS: In the left breast, calcifications warrant further evaluation. The patient has had a right mastectomy. Images were processed with CAD. IMPRESSION: Further evaluation  is suggested for calcifications in the left breast. RECOMMENDATION: Diagnostic mammogram of the left breast. (Code:[id number]) The patient will be contacted regarding the findings, and additional imaging will be scheduled. BI-RADS CATEGORY  0: Incomplete. Need additional imaging evaluation and/or prior mammograms for comparison. Electronically Signed   By: Ammie Ferrier M.D.   On: 05/13/2019 14:03     Assessment and plan- Patient is a 79 y.o. female with invasive mammary carcinoma of the right breast pathological prognostic stage III ApT2pN3a cNX ER PR positive HER-2/neu negative status post right mastectomy and axillary lymph node dissection. She completed adjuvant Taxol chemotherapy followed by radiation treatment and is currently on Arimidex  Patient is tolerating Arimidex along with calcium and vitamin D well without any significant side effects.  Her baseline bone density scan was normal.  I will see her back in 3 months.  Given the patient at high risk ER positive breast cancer there would be a role for adjuvant Zometa every 6 months for up to 5 years per ASCO guidelines despite a normal bone density scan to reduce the chances of skeletal metastases.  She has already received dental clearance and we will proceed with her first dose today.  Discussed risks and benefits of Zometa again including all but not limited to fatigue, hypocalcemia and possible risk of osteonecrosis of the jaw.  Patient understands and agrees to proceed as planned.  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 Long term (current) use of bisphosphonates   2. Encounter for follow-up surveillance of breast cancer   3. Visit for monitoring Arimidex therapy   4. Chemotherapy-induced peripheral neuropathy (HCC)      Dr. Randa Evens, MD, MPH [facility] [phone] 06/03/2019 1:12 PM

## 2019-06-06 ENCOUNTER — Other Ambulatory Visit: Payer: Self-pay

## 2019-06-09 ENCOUNTER — Other Ambulatory Visit: Payer: Self-pay

## 2019-06-09 ENCOUNTER — Encounter: Payer: Self-pay | Admitting: Radiation Oncology

## 2019-06-09 ENCOUNTER — Ambulatory Visit
Admission: RE | Admit: 2019-06-09 | Discharge: 2019-06-09 | Disposition: A | Discharge status: Home / Self Care | Payer: Medicare Other | Source: Ambulatory Visit | Attending: Radiation Oncology | Admitting: Radiation Oncology

## 2019-06-09 VITALS — BP 131/69 | HR 97 | Temp 98.5°F | Resp 16 | Wt 175.8 lb

## 2019-06-09 DIAGNOSIS — C50911 Malignant neoplasm of unspecified site of right female breast: Secondary | ICD-10-CM | POA: Diagnosis not present

## 2019-06-09 DIAGNOSIS — Z923 Personal history of irradiation: Secondary | ICD-10-CM | POA: Diagnosis not present

## 2019-06-09 DIAGNOSIS — Z79810 Long term (current) use of selective estrogen receptor modulators (SERMs): Secondary | ICD-10-CM | POA: Insufficient documentation

## 2019-06-09 DIAGNOSIS — Z170 Estrogen receptor positive status [ER+]: Secondary | ICD-10-CM | POA: Diagnosis not present

## 2019-06-09 NOTE — Progress Notes (Signed)
Radiation Oncology Follow up Note  Name: Theresa Reid   Date:   06/09/2019 MRN:  [id number] DOB: Oct 22, 1940    This 79 y.o. female presents to the clinic today for 1 month follow-up status post radiation therapy to her right chest wall peripheral emphatic's for stage T2 N3 aM0 ER/PR positive HER-2 negative invasive mammary carcinoma status post modified radical mastectomy.  REFERRING PROVIDER: Leone Haven, MD  HPI: Patient is an 79 year old female now at 1 month having completed right chest wall peripheral lymphatic radiation therapy therapy status post right modified radical mastectomy for a T2N3A ER/PR positive invasive mammary carcinoma.  Seen today in routine follow-up she is doing well.  She specifically denies breast tenderness cough or bone pain..  She is currently on Arimidex tolerating that well along with calcium and vitamin D.  COMPLICATIONS OF TREATMENT: none  FOLLOW UP COMPLIANCE: keeps appointments   PHYSICAL EXAM:  BP 131/69   Pulse 97   Temp 98.5 F (36.9 C) (Tympanic)   Resp 16   Wt 175 lb 12.8 oz (79.7 kg)   BMI 29.25 kg/m  No evidence of mass or nodularity the right chest wall is noted.  No axillary or supraclavicular adenopathy is identified she does have a lymphedema sleeve on the right upper extremity.  Well-developed well-nourished patient in NAD. HEENT reveals PERLA, EOMI, discs not visualized.  Oral cavity is clear. No oral mucosal lesions are identified. Neck is clear without evidence of cervical or supraclavicular adenopathy. Lungs are clear to A&P. Cardiac examination is essentially unremarkable with regular rate and rhythm without murmur rub or thrill. Abdomen is benign with no organomegaly or masses noted. Motor sensory and DTR levels are equal and symmetric in the upper and lower extremities. Cranial nerves II through XII are grossly intact. Proprioception is intact. No peripheral adenopathy or edema is identified. No motor or sensory levels are  noted. Crude visual fields are within normal range.  RADIOLOGY RESULTS: No current films for review  PLAN: Present time she is recovering well from her radiation therapy.  I am pleased with her overall progress.  I have asked to see her back in 4 to 5 months.  She continues on Arimidex without side effect.  She continues close follow-up care with medical oncology.  Patient knows to call with any concerns.  I would like to take this opportunity to thank you for allowing me to participate in the care of your patient.Noreene Filbert, MD

## 2019-06-10 DIAGNOSIS — M159 Polyosteoarthritis, unspecified: Secondary | ICD-10-CM | POA: Diagnosis not present

## 2019-06-10 DIAGNOSIS — M353 Polymyalgia rheumatica: Secondary | ICD-10-CM | POA: Diagnosis not present

## 2019-06-30 ENCOUNTER — Ambulatory Visit: Payer: Medicare Other | Attending: Oncology | Admitting: Occupational Therapy

## 2019-06-30 ENCOUNTER — Other Ambulatory Visit: Payer: Self-pay

## 2019-06-30 DIAGNOSIS — I972 Postmastectomy lymphedema syndrome: Secondary | ICD-10-CM

## 2019-06-30 DIAGNOSIS — L905 Scar conditions and fibrosis of skin: Secondary | ICD-10-CM

## 2019-06-30 NOTE — Therapy (Signed)
[place] [facility] [address], [place], [address] Phone: [phone]   Fax:  [phone]  Occupational Therapy Treatment  Patient Details  Name: Theresa Reid MRN: [id number] Date of Birth: 04/22/1941 Referring Provider (OT): Avon Gully   Encounter Date: 06/30/2019  OT End of Session - 06/30/19 1335    Visit Number  9    Number of Visits  9    Date for OT Re-Evaluation  06/30/19    OT Start Time  [id number]    OT Stop Time  1330    OT Time Calculation (min)  25 min    Activity Tolerance  Patient tolerated treatment well    Behavior During Therapy  [facility] for tasks assessed/performed       Past Medical History:  Diagnosis Date  . Arthritis   . Breast cancer ([place])   . Diverticulitis   . Family history of breast cancer   . GERD (gastroesophageal reflux disease)   . Hyperlipidemia   . Hypertension   . Personal history of chemotherapy   . Personal history of radiation therapy     Past Surgical History:  Procedure Laterality Date  . BREAST BIOPSY Right 05/28/2018   [place] bx, INVASIVE MAMMARY CARCINOMA WITH LOBULAR FEATURES  . BREAST LUMPECTOMY Right 06/12/2018   [facility], lobular features  . BREAST LUMPECTOMY WITH SENTINEL LYMPH NODE BIOPSY Right 06/12/2018   Procedure: RIGHT BREAST WIDE EXCISION WITH SENTINEL LYMPH NODE BX;  Surgeon: Robert Bellow, MD;  Location: [facility] ORS;  Service: General;  Laterality: Right;  . CHOLECYSTECTOMY    . [place]  . INCISION AND DRAINAGE OF WOUND Right 10/30/2018   Procedure: IRRIGATION AND DEBRIDEMENT RIGHT CHEST WALL WOUND;  Surgeon: Robert Bellow, MD;  Location: [facility] ORS;  Service: General;  Laterality: Right;  . JOINT REPLACEMENT    . MASTECTOMY Right   . MASTECTOMY WITH AXILLARY LYMPH NODE DISSECTION Right 09/20/2018   Procedure: MASTECTOMY WITH AXILLARY LYMPH NODE DISSECTION RIGHT;  Surgeon: Robert Bellow, MD;  Location: [facility] ORS;  Service: General;   Laterality: Right;  . ovaraian cyst removal Right    Cyst only (NOT OVARY)  . PORTACATH PLACEMENT Left 09/20/2018   Procedure: INSERTION PORT-A-CATH, LEFT;  Surgeon: Robert Bellow, MD;  Location: [facility] ORS;  Service: General;  Laterality: Left;  . TOTAL KNEE ARTHROPLASTY Bilateral 05/05/2010    There were no vitals filed for this visit.  Subjective Assessment - 06/30/19 1334    Subjective   This is my new sleeve and glove- it feels tight and good up here at my upper  arm - picked it up about 2wks ago- neuropathy is getting slowly better    Pertinent History  Patient had her original lumpectomy and sentinel lymph node biopsy back in February 2020 R breast   She then had an MRI which showed more areas of enhancement and was taken back to the OR for mastectomy and axillary lymph node dissection on 09/20/2018 which showed 15 of the lymph nodes were also positive for malignancy and this upstaged her to stage IIIa disease.  Plan was to offer her adjuvant chemotherapy following this.  However her mastectomy course was complicated by flap necrosis requiring debridement antibiotics and she had to be also taken to the OR on 10/30/2018.  Her wound has not completely healed yet and she still has a drain in place.  She seen  Dr. Dahlia Byes on 11/18/2018.  There is  concern for early cellulitis of her chest wall.  Dr. Dahlia Byes after his visit started  patient on Augmentin for 2 more weeks which further delays her chemotherapy. Lymphedema in R UE per pt developed after surgery on 10/30/2018 - stayed the same - not going down - denies pain    Patient Stated Goals  I want to get sleeve so my arm don't  get big and to  prevent infection    Currently in Pain?  No/denies          LYMPHEDEMA/ONCOLOGY QUESTIONNAIRE - 06/30/19 1312      Right Upper Extremity Lymphedema   15 cm Proximal to Olecranon Process  36.5 cm    10 cm Proximal to Olecranon Process  37 cm    Olecranon Process  24 cm    15 cm Proximal to Ulnar Styloid  Process  23 cm    10 cm Proximal to Ulnar Styloid Process  20 cm    Just Proximal to Ulnar Styloid Process  14.8 cm    Across Hand at [employer]  18.5 cm    At [place] of 2nd Digit  6 cm    At [facility] of Thumb  6 cm     Pt arrive with her new Jobst Elvarex soft sleeve and glove - fitting well - do not slide and containing her upper arm on L Measurements looking great - see flowsheet   Pt to cont with compression and replace in 6-8 months   did provided her new tubigrip D and E to wear at night time if needed with isotoner glove                  OT Education - 06/30/19 1335    Education Details  cont compression and replace in 6-8 months    Person(s) Educated  Patient    Methods  Explanation;Demonstration;Tactile cues;Verbal cues;Handout    Comprehension  Returned demonstration;Verbalized understanding       OT Short Term Goals - 06/30/19 1337      OT SHORT TERM GOAL #1   Title  Pt's R UE circumference decrease by at least 1cm -2 cm from hand to upper arm - to be fitted for appropriate compression garments    Status  Achieved        OT Long Term Goals - 06/30/19 1337      OT LONG TERM GOAL #1   Title  Pt to be fitted wth correct compression for R UE and if indiicated chest to decrease lymphedema and prevent cellulitis    Status  Achieved      OT LONG TERM GOAL #2   Title  Pt maintain her L UE lymphedema  circumference with correct compression garments thru out her CA treatment    Status  Achieved            Plan - 06/30/19 1336    Clinical Impression Statement  Pt arrive with replacement compression sleeve Jobst Elvarex soft sleeve and glove- fitting correctly - not sliding and containing her upper arm - pt to cont with compression during day and if want can cont ith isotoner glove at night time and tubigrip D and E on arm for 6-8 months    OT Occupational Profile and History  Problem Focused Assessment - Including review of records relating to presenting  problem    Occupational performance deficits (Please refer to evaluation for details):  ADL's;Play;Leisure;IADL's    Body Structure / Function / Physical Skills  ADL;UE functional use;Scar mobility;Skin integrity;Edema    OT Treatment/Interventions  Self-care/ADL training;Manual lymph drainage;Therapeutic exercise;Patient/family education;Scar mobilization;Manual Therapy    Plan  cont compression garments    OT Home Exercise Plan  see pt instruction    Consulted and Agree with Plan of Care  Patient       Patient will benefit from skilled therapeutic intervention in order to improve the following deficits and impairments:   Body Structure / Function / Physical Skills: ADL, UE functional use, Scar mobility, Skin integrity, Edema       Visit Diagnosis: Postmastectomy lymphedema syndrome  Scar condition and fibrosis of skin    Problem List Patient Active Problem List   Diagnosis Date Noted  . Infected skin flap 10/09/2018  . Cataract 09/24/2018  . Urge incontinence of urine 09/24/2018  . Malignant neoplasm of right female breast ([place]) 06/07/2018  . Goals of care, counseling/discussion 06/07/2018  . Family history of breast cancer   . Dairy product intolerance 01/14/2018  . Seborrheic keratosis 04/10/2017  . Dysuria 02/19/2017  . Visual disturbance 10/03/2016  . GERD (gastroesophageal reflux disease) 09/14/2016  . HLD (hyperlipidemia) 08/13/2015  . Hypertension 11/13/2014  . 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    Rosalyn Gess OTR/L,CLT 06/30/2019, 1:38 PM  [place] [facility] [address], [place], [address] Phone: [phone]   Fax:  [phone]  Name: Theresa Reid MRN: [id number] Date of Birth: 1941/03/02

## 2019-07-01 DIAGNOSIS — H25813 Combined forms of age-related cataract, bilateral: Secondary | ICD-10-CM | POA: Diagnosis not present

## 2019-07-04 ENCOUNTER — Telehealth: Payer: Self-pay

## 2019-07-04 DIAGNOSIS — C50911 Malignant neoplasm of unspecified site of right female breast: Secondary | ICD-10-CM

## 2019-07-04 NOTE — Telephone Encounter (Signed)
Survivorship Care Plan visit completed.  Treatment summary reviewed and mailed to patient.  ASCO answers booklet reviewed and mailed to patient.  CARE program and Cancer Transitions discussed with patient along with other resources cancer center offers to patients and caregivers.  Patient verbalized understanding.  SCP packet mailed.  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   Application sent with SCP packet.   Pt also request to be put on the waiting list for Cancer Transitions.  Both programs on hold right now due to [place] but she has been put on the waiting list.  Patient in agreement for APP to have a Virtual visit to introduce them to the Survivorship Clinic.  Encouraged patient to call for any questions or concerns.

## 2019-07-18 ENCOUNTER — Inpatient Hospital Stay: Payer: Medicare Other | Attending: Oncology | Admitting: Oncology

## 2019-07-18 DIAGNOSIS — C50911 Malignant neoplasm of unspecified site of right female breast: Secondary | ICD-10-CM

## 2019-07-18 DIAGNOSIS — Z170 Estrogen receptor positive status [ER+]: Secondary | ICD-10-CM

## 2019-07-18 NOTE — Progress Notes (Signed)
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   I discussed the limitations, risks, security and privacy concerns of performing an evaluation and management service by telemedicine and the availability of in-person appointments. I also discussed with the patient that there may be a patient responsible charge related to this service. The patient expressed understanding and agreed to proceed.   Other persons participating in the visit and their role in the encounter: None  Patient's location: Home Provider's location: Office  BRIEF ONCOLOGIC HISTORY:  Oncology History  Malignant neoplasm of right female breast ([place])  06/04/2018 Cancer Staging   Staging form: Breast, AJCC 8th Edition - Clinical stage from 06/04/2018: Stage IA (cT1c, cN0, cM0, G1, ER+, PR+, HER2-) - Signed by Sindy Guadeloupe, MD on 06/07/2018   06/07/2018 Initial Diagnosis   Malignant neoplasm of right female breast ([place])   07/04/2018 Cancer Staging   Staging form: Breast, AJCC 8th Edition - Pathologic stage from 07/04/2018: Stage IB (pT2, pN1a, cM0, G2, ER+, PR+, HER2-) - Signed by Sindy Guadeloupe, MD on 07/04/2018   10/04/2018 Cancer Staging   Staging form: Breast, AJCC 8th Edition - Pathologic stage from 10/04/2018: Stage IIIA (pT2, pN3a, cM0, G2, ER+, PR+, HER2-) - Signed by Sindy Guadeloupe, MD on 10/06/2018   10/28/2018 - 10/28/2018 Chemotherapy   The patient had palonosetron (ALOXI) injection 0.25 mg, 0.25 mg, Intravenous,  Once, 0 of 4 cycles pegfilgrastim (NEULASTA ONPRO KIT) injection 6 mg, 6 mg, Subcutaneous, Once, 0 of 4  cycles cyclophosphamide (CYTOXAN) 1,200 mg in sodium chloride 0.9 % 250 mL chemo infusion, 600 mg/m2, Intravenous,  Once, 0 of 4 cycles DOCEtaxel (TAXOTERE) 150 mg in sodium chloride 0.9 % 250 mL chemo infusion, 75 mg/m2, Intravenous,  Once, 0 of 4 cycles  for chemotherapy treatment.    12/02/2018 -  Chemotherapy   The patient had PACLitaxel (TAXOL) 156 mg in sodium chloride 0.9 % 250 mL chemo infusion (</= 60m/m2), 80 mg/m2 = 156 mg, Intravenous,  Once, 12 of 12 cycles Dose modification: 65 mg/m2 (original dose 80 mg/m2, Cycle 5, Reason: Other (see comments), Comment: Neuropathy) Administration: 156 mg (12/02/2018), 156 mg (12/09/2018), 156 mg (12/16/2018), 156 mg (12/23/2018), 126 mg (01/06/2019), 126 mg (01/13/2019), 126 mg (01/20/2019), 126 mg (01/28/2019), 126 mg (02/03/2019), 126 mg (02/10/2019), 126 mg (02/17/2019), 126 mg (02/24/2019)  for chemotherapy treatment.     INTERVAL HISTORY:  Patient presents to survivorship clinic to discuss recent care plan and to assess for any acute needs at this time.  Ms. COncalewas evaluated by Dr. RJanese Bankson 06/02/2019 for follow-up, assess tolerance of Arimidex and begin Zometa.  She admitted to some stable peripheral neuropathy.  In the interim, she has done well.  She denies any side effects from Arimidex.  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  She admits to improving lower extremity neuropathy.  At its worst, she was unable to drive due to not being able to feel her feet.  She now has sensation in both feet and is able to drive again.  She denies having hot flashes.  Patient  does have history of arthritis "from the neck down".  She has aches and pains on a daily basis that do not appear worse since beginning Arimidex.  Her hair is almost completely grown back which she is happy about.  She is followed by Luna Fuse for lymphedema.  She saw her a few weeks back and had new tubular bandages applied.   She wears a compression sleeve daily.  ADDITIONAL REVIEW OF SYSTEMS:  Review of Systems  Constitutional: Negative.  Negative for chills, fever, malaise/fatigue and weight loss.  HENT: Negative for congestion, ear pain and tinnitus.   Eyes: Negative.  Negative for blurred vision and double vision.  Respiratory: Negative.  Negative for cough, sputum production and shortness of breath.   Cardiovascular: Negative.  Negative for chest pain, palpitations and leg swelling.  Gastrointestinal: Negative.  Negative for abdominal pain, constipation, diarrhea, nausea and vomiting.  Genitourinary: Negative for dysuria, frequency and urgency.  Musculoskeletal: Positive for myalgias. Negative for back pain and falls.  Skin: Negative.  Negative for rash.  Neurological: Positive for tingling and sensory change (Improved). Negative for weakness and headaches.  Endo/Heme/Allergies: Negative.  Does not bruise/bleed easily.  Psychiatric/Behavioral: Negative.  Negative for depression. The patient is not nervous/anxious and does not have insomnia.      PAST MEDICAL & SURGICAL HISTORY:  Past Medical History:  Diagnosis Date  . Arthritis   . Breast cancer ([place])   . Diverticulitis   . Family history of breast cancer   . GERD (gastroesophageal reflux disease)   . Hyperlipidemia   . Hypertension   . Personal history of chemotherapy   . Personal history of radiation therapy    Past Surgical History:  Procedure Laterality Date  . BREAST BIOPSY Right 05/28/2018   [place] bx, INVASIVE MAMMARY CARCINOMA WITH LOBULAR FEATURES  . BREAST LUMPECTOMY Right 06/12/2018   [facility], lobular features  . BREAST LUMPECTOMY WITH SENTINEL LYMPH NODE BIOPSY Right 06/12/2018   Procedure: RIGHT BREAST WIDE EXCISION WITH SENTINEL LYMPH NODE BX;  Surgeon: Robert Bellow, MD;  Location: [facility] ORS;  Service: General;  Laterality: Right;  . CHOLECYSTECTOMY    . [place]  . INCISION AND DRAINAGE OF WOUND Right 10/30/2018    Procedure: IRRIGATION AND DEBRIDEMENT RIGHT CHEST WALL WOUND;  Surgeon: Robert Bellow, MD;  Location: [facility] ORS;  Service: General;  Laterality: Right;  . JOINT REPLACEMENT    . MASTECTOMY Right   . MASTECTOMY WITH AXILLARY LYMPH NODE DISSECTION Right 09/20/2018   Procedure: MASTECTOMY WITH AXILLARY LYMPH NODE DISSECTION RIGHT;  Surgeon: Robert Bellow, MD;  Location: [facility] ORS;  Service: General;  Laterality: Right;  . ovaraian cyst removal Right    Cyst only (NOT OVARY)  . PORTACATH PLACEMENT Left 09/20/2018   Procedure: INSERTION PORT-A-CATH, LEFT;  Surgeon: Robert Bellow, MD;  Location: [facility] ORS;  Service: General;  Laterality: Left;  . TOTAL KNEE ARTHROPLASTY Bilateral 05/05/2010    SOCIAL HISTORY:  None   CURRENT MEDICATIONS:  Current Outpatient Medications on File Prior to Visit  Medication Sig Dispense Refill  . amLODipine (NORVASC) 5 MG tablet Take 1 tablet (5 mg total) by mouth daily. 90 tablet 1  . anastrozole (ARIMIDEX) 1 MG tablet Take 1 tablet (1 mg total) by mouth daily. Do not start until completed radiation in Jan 2021 30 tablet 2  . atorvastatin (LIPITOR) 80 MG tablet Take 1 tablet (80 mg total) by mouth daily. 90 tablet 1  . CALCIUM-VITAMIN D PO Take  1 tablet by mouth daily.     Marland Kitchen CRANBERRY PO Take 1 tablet by mouth 2 (two) times daily.     . Multiple Vitamins tablet Take 1 tablet by mouth daily.     . Omega-3 Fatty Acids (FISH OIL PO) Take 1 capsule by mouth daily.     Marland Kitchen oxybutynin (DITROPAN) 5 MG tablet Take 1 tablet (5 mg total) by mouth 2 (two) times daily. 180 tablet 1  . Turmeric 500 MG CAPS Take 1,000 mg by mouth 2 (two) times daily.     . [DISCONTINUED] prochlorperazine (COMPAZINE) 10 MG tablet Take 1 tablet (10 mg total) by mouth every 6 (six) hours as needed (Nausea or vomiting). 30 tablet 1   No current facility-administered medications on file prior to visit.    ALLERGIES:  No Known Allergies  PHYSICAL EXAM:  Limited due to virtual  platform  LABORATORY DATA:  Lab Results  Component Value Date   WBC 6.3 06/02/2019   HGB 12.7 06/02/2019   HCT 40.8 06/02/2019   MCV 92.7 06/02/2019   PLT 238 06/02/2019      Chemistry      Component Value Date/Time   NA 139 06/02/2019 0851   NA 142 07/16/2015 0000   NA 138 11/08/2012 0548   K 3.9 06/02/2019 0851   K 3.3 (L) 11/08/2012 0548   CL 105 06/02/2019 0851   CL 103 11/08/2012 0548   CO2 24 06/02/2019 0851   CO2 28 11/08/2012 0548   BUN 7 (L) 06/02/2019 0851   BUN 10 07/16/2015 0000   BUN 6 (L) 11/08/2012 0548   CREATININE 0.34 (L) 06/02/2019 0851   CREATININE 0.60 11/08/2012 0548   GLU 113 07/16/2015 0000      Component Value Date/Time   CALCIUM 8.7 (L) 06/02/2019 0851   CALCIUM 8.7 11/08/2012 0548   ALKPHOS 96 06/02/2019 0851   AST 18 06/02/2019 0851   ALT 14 06/02/2019 0851   BILITOT 0.5 06/02/2019 0851   BILITOT 0.4 11/13/2014 0948     DIAGNOSTIC IMAGING:  Left breast Mammogram CLINICAL DATA:  Screening.  EXAM: DIGITAL SCREENING UNILATERAL LEFT MAMMOGRAM WITH CAD AND TOMO  COMPARISON:  Previous exam(s).  ACR Breast Density Category a: The breast tissue is almost entirely fatty.  FINDINGS: In the left breast, calcifications warrant further evaluation. The patient has had a right mastectomy. Images were processed with CAD.  IMPRESSION: Further evaluation is suggested for calcifications in the left breast.  RECOMMENDATION: Diagnostic mammogram of the left breast. (Code:[id number])  The patient will be contacted regarding the findings, and additional imaging will be scheduled.  BI-RADS CATEGORY  0: Incomplete. Need additional imaging evaluation and/or prior mammograms for comparison.  Diagnostic Mammogram 05/27/2019  RECOMMENDATION: The patient and I discussed in detail the options of diagnostic left mammogram in 6 months to evaluate the calcifications versus stereotactic biopsy for pathologic diagnosis. At this point,  she prefers imaging follow-up.  Diagnostic left mammogram is recommended in 6 months.  I have discussed the findings and recommendations with the patient. If applicable, a reminder letter will be sent to the patient regarding the next appointment.  BI-RADS CATEGORY  3: Probably benign.  DEXA-02/27/2019  ASSESSMENT: The BMD measured at Femur Neck Left is 0.925 g/cm2 with a T-score of -0.8. This patient is considered normal according to [facility]) criteria. The scan quality is good. Lumbar spine was not utilized due to advanced degenerative changes.  ASSESSMENT & PLAN:  Ms. Flynt is a pleasant  79 y.o. female who underwent a screening mammogram in January 2020 which showed a breast mass of about 1 cm and normal-appearing axilla.  Patient underwent lumpectomy and sentinel lymph node biopsy in February 2020.  Pathology revealed invasive lobular carcinoma with positive medial margin which was reexcised with still positivity.  Sentinel lymph node was positive for macro met of 8 mm.  No extranodal extension was present.  MammaPrint came back as high risk.  She then had MRI which showed additional suspicion along the inferior margin of the biopsy cavity highly suspicious.  Had right mastectomy and axillary lymph node dissection on 09/20/2018.  No residual invasive mammary carcinoma within the breast.  15 out of 20 lymph nodes were involved with metastatic carcinoma measuring up to 6 mm remaining 5 lymph nodes were isolated tumor cells.  Patient surgery was complicated by flap necrosis requiring debridement and antibiotics.  Given her age and postmastectomy complications along with delayed wound healing weekly Taxol x12 cycles was chosen as the regimen of her adjuvant chemotherapy which she completed in November 2020.  She began Arimidex in December 2020 stable parents are limited she also completed adjuvant radiation treatment.  Patient presents to survivorship clinic for her  care plan and to address any acute survivorship concerns   1. History of Breast Cancer: Clinically, she is without evidence of disease recurrence based on physical exam/diagnostic imaging.  Today, she received a copy of her survivorship care plan (SCP) document, which was reviewed with her in detail.  The SCP details her cancer treatment history and potential late/long-term side effects of those treatments.  We discussed the follow-up schedule she can anticipate with interval imaging for surveillance of her cancer.  I have also shared a copy of her treatment summary/SCP with her PCP.  Ms. Nulty will return to the survivorship clinic as needed; she will return to [place] [facility] for surveillance visit with Dr. Janese Banks on 08/26/2019.  She will also have a repeat mammogram in approximately 6 months from previous to follow-up on calcifications from February.  2. Problem at visit:  Improving/stable neuropathy. Lymphedema right arm-15 LN removed+ Radiation + mastectomy- Followed by Rosalyn Gess.  Symptoms are improving and stable at this time.  3. Smoking cessation: I commended Ms. Danzy's continued efforts to remain tobacco-free.  We discussed that one of the most important risk reduction strategies in preventing cancer recurrence in lung cancer patients is smoking cessation.  She is committed to abstaining from tobacco.  4. Physical activity/Healthy eating: Getting adequate physical activity and maintaining a healthy diet as a cancer survivor is important for overall wellness and reduces the risk of cancer recurrence. We discussed the CARE program which is a fitness program that is offered to cancer survivors free of charge.  We also reviewed the American Cancer Society's booklet with recommendations for nutrition and physical activity.    4. Health promotion/Cancer screening:  Ms. Matthew is reportedly up-to-date on her colonoscopy, pap smear, skin screenings, and vaccinations.  I  encouraged her to talk with his PCP about arranging appropriate cancer screening tests..   5. Support services/Counseling: Ms. Hagstrom was seen today in in effort to address both the physical and social concerns of our cancer survivors at [facility]. It is not uncommon for this period of the patient's cancer care trajectory to be one of many emotions and stressors.  I provided support today through active listening, validation of concerns, and expressive supportive counseling.  Ms. Allie was  encouraged to take advantage of our support services programs and support groups to better cope in her new life as a cancer survivor after completing anti-cancer treatment.   NCCN Guidelines and Recommendations:  NCCN guideline recommendations for invasive breast cancer ER/PR positive HER-2/neu negative disease  1.  Interval history and physical exam 1-4 times per year as clinically appropriate for 5 years, then annually.  2.  Mammogram every 12 months.  3, endocrine therapy: Assess and encourage adherence; patients taking tamoxifen should have annual gynecological  assessment.   4.  Patient is on an AI or those who experienced ovarian failure secondary to treatment should have monitoring of bone health with a bone mineral density at baseline and periodically thereafter.  Dispo:  -RTC for follow-up with oncologist on 08/26/2019. -RTC for follow-up with radiation oncology as scheduled  I provided 25 minutes of non face-to-face telephone visit time during this encounter, and > 50% was spent counseling as documented under my assessment & plan.  Rulon Abide, AGNP-C [place] [facility] [place] (office) 07/18/19 9:21 AM   CC: Dr. Janese Banks

## 2019-08-04 DIAGNOSIS — H43813 Vitreous degeneration, bilateral: Secondary | ICD-10-CM | POA: Diagnosis not present

## 2019-08-21 ENCOUNTER — Other Ambulatory Visit: Payer: Self-pay | Admitting: Oncology

## 2019-08-26 ENCOUNTER — Inpatient Hospital Stay: Payer: Medicare Other

## 2019-08-26 ENCOUNTER — Other Ambulatory Visit: Payer: Self-pay

## 2019-08-26 ENCOUNTER — Encounter: Payer: Self-pay | Admitting: Oncology

## 2019-08-26 ENCOUNTER — Inpatient Hospital Stay: Payer: Medicare Other | Attending: Oncology | Admitting: Oncology

## 2019-08-26 VITALS — BP 130/70 | HR 83 | Resp 20 | Wt 176.9 lb

## 2019-08-26 DIAGNOSIS — Z853 Personal history of malignant neoplasm of breast: Secondary | ICD-10-CM | POA: Diagnosis not present

## 2019-08-26 DIAGNOSIS — Z08 Encounter for follow-up examination after completed treatment for malignant neoplasm: Secondary | ICD-10-CM | POA: Diagnosis not present

## 2019-08-26 DIAGNOSIS — G620 Drug-induced polyneuropathy: Secondary | ICD-10-CM | POA: Diagnosis not present

## 2019-08-26 DIAGNOSIS — Z79811 Long term (current) use of aromatase inhibitors: Secondary | ICD-10-CM | POA: Insufficient documentation

## 2019-08-26 DIAGNOSIS — Z5181 Encounter for therapeutic drug level monitoring: Secondary | ICD-10-CM

## 2019-08-26 DIAGNOSIS — C50911 Malignant neoplasm of unspecified site of right female breast: Secondary | ICD-10-CM | POA: Diagnosis not present

## 2019-08-26 DIAGNOSIS — T451X5A Adverse effect of antineoplastic and immunosuppressive drugs, initial encounter: Secondary | ICD-10-CM | POA: Insufficient documentation

## 2019-08-26 DIAGNOSIS — Z95828 Presence of other vascular implants and grafts: Secondary | ICD-10-CM

## 2019-08-26 DIAGNOSIS — Z170 Estrogen receptor positive status [ER+]: Secondary | ICD-10-CM | POA: Diagnosis not present

## 2019-08-26 MED ORDER — HEPARIN SOD (PORK) LOCK FLUSH 100 UNIT/ML IV SOLN
INTRAVENOUS | Status: AC
  Filled 2019-08-26: qty 5

## 2019-08-26 MED ORDER — HEPARIN SOD (PORK) LOCK FLUSH 100 UNIT/ML IV SOLN
500.0000 [IU] | Freq: Once | INTRAVENOUS | Status: AC
  Administered 2019-08-26: 500 [IU] via INTRAVENOUS
  Filled 2019-08-26: qty 5

## 2019-08-26 MED ORDER — SODIUM CHLORIDE 0.9% FLUSH
10.0000 mL | INTRAVENOUS | Status: DC | PRN
  Administered 2019-08-26: 10 mL via INTRAVENOUS
  Filled 2019-08-26: qty 10

## 2019-08-26 NOTE — Progress Notes (Signed)
Patient here today for follow up regarding breast cancer. Patient denies any concerns today.  

## 2019-08-29 NOTE — Progress Notes (Signed)
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  Heme/Onc history: Patient is a 79 year old female who underwent a screening mammogram in January 2020 which showed a breast mass of about 1 cm and normal-appearing axilla which was biopsied and showed 4 mm grade 1 ER PR positive and HER-2/neu negative invasive mammary seroma. Patient underwent lumpectomy and sentinel lymph node biopsy in February 2020. Pathology showed invasive lobular carcinoma with positive medial margin which was reexcised with still positive. Tumor was 24 mm, grade 2 ER PR positive and HER-2/neu negative. One sentinel lymph node that was excised was positive for macro met of 8 mm. No extranodal extension was present. MammaPrint came back as high risk with an average 10-year risk of untreated disease is 29%. Patient then underwent a bilateral MRI which showed additional suspicious non-mass enhancement along the inferior margin of the biopsy cavity extending 2 cm highly suspicious for continuous disease and another highly suspicious linear non-mass enhancement at the base of the nipple concerning for multifocal multicentric disease. Patient underwent right  mastectomy and axillary lymph node dissectionon 09/20/2018. There was no residual invasive mammary carcinoma within the breast. However 15 out of 20 lymph nodes were involved with metastatic carcinoma measuring up to 6 mm and remaining 5 lymph nodes with isolated tumor cells. Pathologic stage was [id number]  Despite having surgery in May 2020 which was a second surgery-patient has not been able to start adjuvant chemotherapy. Her mastectomy was complicated by flap necrosis requiring debridement antibiotics and she had to be taken back to the OR for the same. Given her age and postmastectomy complications and delayed wound healing-weekly Taxol x12 cycles was chosen as the regimen for her adjuvant chemotherapywhich she completed on 02/25/2019.  Patient started Arimidex in December 2020 and also received dental clearance to start Zometa.  She completed adjuvant radiation treatment  Interval history-patient is currently doing well on Arimidex calcium and vitamin D.  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  ECOG PS- 1 Pain scale- 0   Review of systems- Review of Systems  Constitutional: Negative for chills, fever, malaise/fatigue and weight loss.  HENT: Negative for congestion, ear discharge and nosebleeds.   Eyes: Negative for blurred vision.  Respiratory: Negative for cough, hemoptysis, sputum production, shortness of breath and wheezing.   Cardiovascular: Negative for chest pain, palpitations, orthopnea and claudication.  Gastrointestinal: Negative for abdominal pain, blood in stool, constipation, diarrhea, heartburn, melena, nausea and vomiting.  Genitourinary: Negative for dysuria, flank pain, frequency, hematuria and urgency.  Musculoskeletal: Negative for back pain, joint pain and myalgias.       Right arm swelling  Skin: Negative for rash.  Neurological: Negative for dizziness, tingling, focal weakness, seizures, weakness  and headaches.  Endo/Heme/Allergies: Does not bruise/bleed easily.  Psychiatric/Behavioral:  Negative for depression and suicidal ideas. The patient does not have insomnia.       No Known Allergies   Past Medical History:  Diagnosis Date  . Arthritis   . Breast cancer ([place])   . Diverticulitis   . Family history of breast cancer   . GERD (gastroesophageal reflux disease)   . Hyperlipidemia   . Hypertension   . Personal history of chemotherapy   . Personal history of radiation therapy      Past Surgical History:  Procedure Laterality Date  . BREAST BIOPSY Right 05/28/2018   [place] bx, INVASIVE MAMMARY CARCINOMA WITH LOBULAR FEATURES  . BREAST LUMPECTOMY Right 06/12/2018   [facility], lobular features  . BREAST LUMPECTOMY WITH SENTINEL LYMPH NODE BIOPSY Right 06/12/2018   Procedure: RIGHT BREAST WIDE EXCISION WITH SENTINEL LYMPH NODE BX;  Surgeon: Robert Bellow, MD;  Location: [facility] ORS;  Service: General;  Laterality: Right;  . CHOLECYSTECTOMY    . [place]  . INCISION AND DRAINAGE OF WOUND Right 10/30/2018   Procedure: IRRIGATION AND DEBRIDEMENT RIGHT CHEST WALL WOUND;  Surgeon: Robert Bellow, MD;  Location: [facility] ORS;  Service: General;  Laterality: Right;  . JOINT REPLACEMENT    . MASTECTOMY Right   . MASTECTOMY WITH AXILLARY LYMPH NODE DISSECTION Right 09/20/2018   Procedure: MASTECTOMY WITH AXILLARY LYMPH NODE DISSECTION RIGHT;  Surgeon: Robert Bellow, MD;  Location: [facility] ORS;  Service: General;  Laterality: Right;  . ovaraian cyst removal Right    Cyst only (NOT OVARY)  . PORTACATH PLACEMENT Left 09/20/2018   Procedure: INSERTION PORT-A-CATH, LEFT;  Surgeon: Robert Bellow, MD;  Location: [facility] ORS;  Service: General;  Laterality: Left;  . TOTAL KNEE ARTHROPLASTY Bilateral 05/05/2010    Social History   Socioeconomic History  . Marital status: Single    Spouse name: Not on file  . Number of children: Not on file  . Years of education: Not on file   . Highest education level: Not on file  Occupational History  . Occupation: retired  Tobacco Use  . Smoking status: Never Smoker  . Smokeless tobacco: Never Used  Substance and Sexual Activity  . Alcohol use: No    Alcohol/week: 0.0 standard drinks  . Drug use: No  . Sexual activity: Never  Other Topics Concern  . Not on file  Social History Narrative   Retired    Lives by herself    Pets: None   Caffeine- Coffee 2 cups daily, no tea/soda      Social Determinants of Health   Financial Resource Strain: Low Risk   . Difficulty of Paying Living Expenses: Not hard at all  Food Insecurity: No Food Insecurity  . Worried About Charity fundraiser in the Last Year: Never true  . Ran Out of Food in the Last Year: Never true  Transportation Needs: No Transportation Needs  . Lack of Transportation (Medical): No  . Lack of Transportation (Non-Medical): No  Physical Activity:   . Days of Exercise per Week:   . Minutes of Exercise per Session:   Stress: No Stress Concern Present  . Feeling of Stress : Not at all  Social Connections: Unknown  . Frequency of Communication with Friends and Family: More than three times a week  . Frequency of Social Gatherings with Friends and Family: More than three times a week  . Attends Religious Services: Not on file  . Active Member of Clubs or Organizations: Not on file  .  Attends Archivist Meetings: Not on file  . Marital Status: Not on file  Intimate Partner Violence: Not At Risk  . Fear of Current or Ex-Partner: No  . Emotionally Abused: No  . Physically Abused: No  . Sexually Abused: No    Family History  Problem Relation Age of Onset  . Alcohol abuse Mother        deceased 1  . Diabetes Father   . Breast cancer Maternal Aunt        dx 72s; deceased 30s  . Breast cancer Maternal Aunt        dx 2s; deceased 16s     Current Outpatient Medications:  .  amLODipine (NORVASC) 5 MG tablet, Take 1 tablet (5 mg total) by  mouth daily., Disp: 90 tablet, Rfl: 1 .  anastrozole (ARIMIDEX) 1 MG tablet, TAKE 1 TABLET BY MOUTH  DAILY DO NOT START UNTIL  COMPLETED RADIATION IN JAN  2021, Disp: 90 tablet, Rfl: 3 .  atorvastatin (LIPITOR) 80 MG tablet, Take 1 tablet (80 mg total) by mouth daily., Disp: 90 tablet, Rfl: 1 .  CALCIUM-VITAMIN D PO, Take 1 tablet by mouth daily. , Disp: , Rfl:  .  CRANBERRY PO, Take 1 tablet by mouth 2 (two) times daily. , Disp: , Rfl:  .  Multiple Vitamins tablet, Take 1 tablet by mouth daily. , Disp: , Rfl:  .  Omega-3 Fatty Acids (FISH OIL PO), Take 1 capsule by mouth daily. , Disp: , Rfl:  .  oxybutynin (DITROPAN) 5 MG tablet, Take 1 tablet (5 mg total) by mouth 2 (two) times daily., Disp: 180 tablet, Rfl: 1 .  Turmeric 500 MG CAPS, Take 1,000 mg by mouth 2 (two) times daily. , Disp: , Rfl:   Physical exam:  Vitals:   08/26/19 1332  BP: 130/70  Pulse: 83  Resp: 20  Weight: 176 lb 14.4 oz (80.2 kg)   Physical Exam Cardiovascular:     Rate and Rhythm: Normal rate and regular rhythm.     Heart sounds: Normal heart sounds.  Pulmonary:     Effort: Pulmonary effort is normal.     Breath sounds: Normal breath sounds.  Abdominal:     General: Bowel sounds are normal.     Palpations: Abdomen is soft.  Musculoskeletal:     Comments: Lymphedema sleeve in place over the right upper extremity  Skin:    General: Skin is warm and dry.  Neurological:     Mental Status: She is alert and oriented to person, place, and time.     Patient is s/p right mastectomy with a well-healed surgical scar.  No evidence of chest wall recurrence.  No palpable bilateral axillary adenopathy.  No palpable masses in the left breast.  No nipple changes noted in the left breast.   CMP Latest Ref Rng & Units 06/02/2019  Glucose 70 - 99 mg/dL 104(H)  BUN 8 - 23 mg/dL 7(L)  Creatinine 0.44 - 1.00 mg/dL 0.34(L)  Sodium 135 - 145 mmol/L 139  Potassium 3.5 - 5.1 mmol/L 3.9  Chloride 98 - 111 mmol/L 105  CO2 22 -  32 mmol/L 24  Calcium 8.9 - 10.3 mg/dL 8.7(L)  Total Protein 6.5 - 8.1 g/dL 6.9  Total Bilirubin 0.3 - 1.2 mg/dL 0.5  Alkaline Phos 38 - 126 U/L 96  AST 15 - 41 U/L 18  ALT 0 - 44 U/L 14   CBC Latest Ref Rng & Units 06/02/2019  WBC 4.0 -  10.5 K/uL 6.3  Hemoglobin 12.0 - 15.0 g/dL 12.7  Hematocrit 36.0 - 46.0 % 40.8  Platelets 150 - 400 K/uL 238     Assessment and plan- Patient is a 79 y.o. female with invasive mammary carcinoma of the right breast pathological prognostic stage III ApT2pN3a cNX ER PR positive HER-2/neu negative status post right mastectomy and axillary lymph node dissection.  She completed 12 cycles of weekly Taxol chemotherapy and adjuvant radiation treatment.  She is currently on Arimidex and this is a routine follow-up visit  Clinically patient is doing well and no concerning signs and symptoms of recurrence based on today's exam.  She is tolerating Arimidex along with calcium and vitamin D well.  Her breast exam is unremarkable.  Her future mammograms will be coordinated by Dr. Tollie Pizza.  I will see her back in 3 months with labs and to receive 2nd dose of Zometa.  Chemo induced peripheral neuropathy: Patient reports significant improvement in her symptoms and they have almost resolved.   Visit Diagnosis 1. Encounter for follow-up surveillance of breast cancer   2. Visit for monitoring Arimidex therapy   3. Chemotherapy-induced peripheral neuropathy ([place])      Dr. Randa Evens, MD, MPH [facility] [phone] 08/29/2019 8:19 AM

## 2019-09-11 ENCOUNTER — Encounter: Payer: Medicare Other | Admitting: Occupational Therapy

## 2019-09-19 ENCOUNTER — Other Ambulatory Visit: Payer: Self-pay

## 2019-09-19 ENCOUNTER — Ambulatory Visit: Payer: Medicare Other | Attending: Oncology | Admitting: Occupational Therapy

## 2019-09-19 DIAGNOSIS — L905 Scar conditions and fibrosis of skin: Secondary | ICD-10-CM | POA: Diagnosis not present

## 2019-09-19 DIAGNOSIS — I972 Postmastectomy lymphedema syndrome: Secondary | ICD-10-CM | POA: Diagnosis not present

## 2019-09-19 NOTE — Patient Instructions (Signed)
See note

## 2019-09-19 NOTE — Therapy (Signed)
[facility] [address], [place], [address] Phone: [phone]   Fax:  [phone]  Occupational Therapy Treatment/re-eval  Patient Details  Name: Theresa Reid MRN: [id number] Date of Birth: 10-05-1940 Referring Provider (OT): Avon Gully   Encounter Date: 09/19/2019  OT End of Session - 09/19/19 1631    Visit Number  10    Number of Visits  12    Date for OT Re-Evaluation  10/31/19    OT Start Time  1000    OT Stop Time  1040    OT Time Calculation (min)  40 min    Activity Tolerance  Patient tolerated treatment well    Behavior During Therapy  [facility] for tasks assessed/performed       Past Medical History:  Diagnosis Date  . Arthritis   . Breast cancer ([place])   . Diverticulitis   . Family history of breast cancer   . GERD (gastroesophageal reflux disease)   . Hyperlipidemia   . Hypertension   . Personal history of chemotherapy   . Personal history of radiation therapy     Past Surgical History:  Procedure Laterality Date  . BREAST BIOPSY Right 05/28/2018   [place] bx, INVASIVE MAMMARY CARCINOMA WITH LOBULAR FEATURES  . BREAST LUMPECTOMY Right 06/12/2018   [facility], lobular features  . BREAST LUMPECTOMY WITH SENTINEL LYMPH NODE BIOPSY Right 06/12/2018   Procedure: RIGHT BREAST WIDE EXCISION WITH SENTINEL LYMPH NODE BX;  Surgeon: Robert Bellow, MD;  Location: [facility] ORS;  Service: General;  Laterality: Right;  . CHOLECYSTECTOMY    . [place]  . INCISION AND DRAINAGE OF WOUND Right 10/30/2018   Procedure: IRRIGATION AND DEBRIDEMENT RIGHT CHEST WALL WOUND;  Surgeon: Robert Bellow, MD;  Location: [facility] ORS;  Service: General;  Laterality: Right;  . JOINT REPLACEMENT    . MASTECTOMY Right   . MASTECTOMY WITH AXILLARY LYMPH NODE DISSECTION Right 09/20/2018   Procedure: MASTECTOMY WITH AXILLARY LYMPH NODE DISSECTION RIGHT;  Surgeon: Robert Bellow, MD;  Location: [facility] ORS;  Service:  General;  Laterality: Right;  . ovaraian cyst removal Right    Cyst only (NOT OVARY)  . PORTACATH PLACEMENT Left 09/20/2018   Procedure: INSERTION PORT-A-CATH, LEFT;  Surgeon: Robert Bellow, MD;  Location: [facility] ORS;  Service: General;  Laterality: Left;  . TOTAL KNEE ARTHROPLASTY Bilateral 05/05/2010    There were no vitals filed for this visit.  Subjective Assessment - 09/19/19 1630    Subjective   I am having some issues the last few wks that my shoulder and neck are sore and heavy feeling in the am - when sleeping on that side - tight into my neck too - seen my arthritis MD and told me to do exercises for the whole body    Pertinent History  Patient had her original lumpectomy and sentinel lymph node biopsy back in February 2020 R breast   She then had an MRI which showed more areas of enhancement and was taken back to the OR for mastectomy and axillary lymph node dissection on 09/20/2018 which showed 15 of the lymph nodes were also positive for malignancy and this upstaged her to stage IIIa disease.  Plan was to offer her adjuvant chemotherapy following this.  However her mastectomy course was complicated by flap necrosis requiring debridement antibiotics and she had to be also taken to the OR on 10/30/2018.  Her wound has not completely healed yet and  she still has a drain in place.  She seen  Dr. Dahlia Byes on 11/18/2018.  There is concern for early cellulitis of her chest wall.  Dr. Dahlia Byes after his visit started  patient on Augmentin for 2 more weeks which further delays her chemotherapy. Lymphedema in R UE per pt developed after surgery on 10/30/2018 - stayed the same - not going down - denies pain    Patient Stated Goals  I want to get sleeve so my arm don't  get big and to  prevent infection    Currently in Pain?  Yes    Pain Score  4     Pain Location  Shoulder    Pain Orientation  Right    Pain Descriptors / Indicators  Aching;Tightness    Pain Type  Chronic pain    Pain Onset  More than a  month ago    Pain Frequency  Intermittent    Aggravating Factors   am          LYMPHEDEMA/ONCOLOGY QUESTIONNAIRE - 09/19/19 1008      Right Upper Extremity Lymphedema   15 cm Proximal to Olecranon Process  35.6 cm    10 cm Proximal to Olecranon Process  36.5 cm    Olecranon Process  24.2 cm    15 cm Proximal to Ulnar Styloid Process  22.5 cm    10 cm Proximal to Ulnar Styloid Process  19.5 cm    Just Proximal to Ulnar Styloid Process  14.2 cm    Across Hand at [employer]  18.5 cm    At [employer] of 2nd Digit  6 cm    At [facility] of Thumb  6 cm      Left Upper Extremity Lymphedema   10 cm Proximal to Olecranon Process  36 cm    Olecranon Process  25 cm    15 cm Proximal to Ulnar Styloid Process  20 cm        Pt arrive with her new Jobst Elvarex soft sleeve and glove - fitting well - do not slide and containing her R UE lymphedema  Measurements looking great -decrease -  see flowsheet   Pt to cont with compression and replace in 6-8 months   did provided her new tubigrip D and E to wear at night time if needed with isotoner glove  Pt this date report increase pain in the am in R shoulder ,tightness in shoulder and neck -upper traps and scalens tight - cervical  Decrease cervical AROM in rotation , lateral flexion -  Pt provided with stretches , and shoulder flexion in supine - not sitting  And scapula squeezes  And then pt to keep arm supported or in supine when donning compression sleeve - that she do not increase tightness in upper traps   Pt ask for some other exercises for OA - provided some sit<> stand, side stepping -holding on  Heel raises , marching for core and cardio  Pain free - to do 10 reps  2 x day                   OT Education - 09/19/19 1631    Education Details  findings , HEP    Person(s) Educated  Patient    Methods  Explanation;Demonstration;Tactile cues;Verbal cues;Handout    Comprehension  Returned demonstration;Verbalized  understanding       OT Short Term Goals - 06/30/19 1337      OT SHORT TERM GOAL #  1   Title  Pt's R UE circumference decrease by at least 1cm -2 cm from hand to upper arm - to be fitted for appropriate compression garments    Status  Achieved        OT Long Term Goals - 09/19/19 1635      OT LONG TERM GOAL #1   Title  Pt report decrease pain and stiffness in R shoulder in the am and increase cervical AROM    Baseline  increase pain 5/10 and tightenss in upper traps , scalens , decrease cervical AROM and tightness    Time  6    Period  Weeks    Status  New    Target Date  10/31/19            Plan - 09/19/19 1632    Clinical Impression Statement  Pt was seen for R UE lymphedema - she is wearing Job[employer] Elvarex soft sleeve and glove during day and tubigrip with isotoner glove night time and doing well containing her lymphedema - but she has history of OA with shoulder the worse - decrease shoulder flexion with pain -and now present this date with increase stiffness and tightness in upper traps and cervical - with decrease AROM - pt provided with some HEP to try for 2 wks - and for hips, core and balance - if not better - will recommend PT eval    OT Occupational Profile and History  Problem Focused Assessment - Including review of records relating to presenting problem    Occupational performance deficits (Please refer to evaluation for details):  ADL's;Play;Leisure;IADL's    Body Structure / Function / Physical Skills  ADL;UE functional use;Scar mobility;Skin integrity;Edema    Rehab Potential  Good    Clinical Decision Making  Several treatment options, min-mod task modification necessary    Comorbidities Affecting Occupational Performance:  May have comorbidities impacting occupational performance    Modification or Assistance to Complete Evaluation   No modification of tasks or assist necessary to complete eval    OT Frequency  Biweekly    OT Duration  6 weeks    OT  Treatment/Interventions  Self-care/ADL training;Manual lymph drainage;Therapeutic exercise;Patient/family education;Scar mobilization;Manual Therapy    Plan  assess progress with HEP    OT Home Exercise Plan  see pt instruction    Consulted and Agree with Plan of Care  Patient       Patient will benefit from skilled therapeutic intervention in order to improve the following deficits and impairments:   Body Structure / Function / Physical Skills: ADL, UE functional use, Scar mobility, Skin integrity, Edema       Visit Diagnosis: Postmastectomy lymphedema syndrome - Plan: Ot plan of care cert/re-cert  Scar condition and fibrosis of skin - Plan: Ot plan of care cert/re-cert    Problem List Patient Active Problem List   Diagnosis Date Noted  . Infected skin flap 10/09/2018  . Cataract 09/24/2018  . Urge incontinence of urine 09/24/2018  . Malignant neoplasm of right female breast ([place]) 06/07/2018  . Goals of care, counseling/discussion 06/07/2018  . Family history of breast cancer   . Dairy product intolerance 01/14/2018  . Seborrheic keratosis 04/10/2017  . Dysuria 02/19/2017  . Visual disturbance 10/03/2016  . GERD (gastroesophageal reflux disease) 09/14/2016  . HLD (hyperlipidemia) 08/13/2015  . Hypertension 11/13/2014  . 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    Rosalyn Gess OTR/L,CLT 09/19/2019, 4:38 PM  [facility] [address], [place], [address] Phone: [phone]   Fax:  [phone]  Name: PAMELA LEWELLEN MRN: [id number] Date of Birth: 1940/09/19

## 2019-09-20 ENCOUNTER — Other Ambulatory Visit: Payer: Self-pay | Admitting: Family Medicine

## 2019-09-25 DIAGNOSIS — H2513 Age-related nuclear cataract, bilateral: Secondary | ICD-10-CM | POA: Diagnosis not present

## 2019-10-09 ENCOUNTER — Ambulatory Visit: Payer: Medicare Other | Admitting: Occupational Therapy

## 2019-10-17 ENCOUNTER — Other Ambulatory Visit: Payer: Self-pay | Admitting: Family Medicine

## 2019-10-17 DIAGNOSIS — N3941 Urge incontinence: Secondary | ICD-10-CM

## 2019-10-17 DIAGNOSIS — K219 Gastro-esophageal reflux disease without esophagitis: Secondary | ICD-10-CM

## 2019-10-17 DIAGNOSIS — I10 Essential (primary) hypertension: Secondary | ICD-10-CM

## 2019-11-03 ENCOUNTER — Ambulatory Visit (INDEPENDENT_AMBULATORY_CARE_PROVIDER_SITE_OTHER): Payer: Medicare Other | Admitting: Family Medicine

## 2019-11-03 ENCOUNTER — Other Ambulatory Visit: Payer: Self-pay

## 2019-11-03 DIAGNOSIS — Z170 Estrogen receptor positive status [ER+]: Secondary | ICD-10-CM

## 2019-11-03 DIAGNOSIS — C50911 Malignant neoplasm of unspecified site of right female breast: Secondary | ICD-10-CM | POA: Diagnosis not present

## 2019-11-03 DIAGNOSIS — I10 Essential (primary) hypertension: Secondary | ICD-10-CM | POA: Diagnosis not present

## 2019-11-03 DIAGNOSIS — E785 Hyperlipidemia, unspecified: Secondary | ICD-10-CM

## 2019-11-03 DIAGNOSIS — L989 Disorder of the skin and subcutaneous tissue, unspecified: Secondary | ICD-10-CM | POA: Diagnosis not present

## 2019-11-03 MED ORDER — KETOCONAZOLE 2 % EX SHAM: MEDICATED_SHAMPOO | CUTANEOUS | 0 refills | Status: DC

## 2019-11-03 NOTE — Assessment & Plan Note (Signed)
Adequate control. Continue current regimen.  

## 2019-11-03 NOTE — Assessment & Plan Note (Signed)
She will continue to follow with her multiple specialists.

## 2019-11-03 NOTE — Progress Notes (Signed)
  Tommi Rumps, MD Phone: [phone]  Theresa Reid is a 79 y.o. female who presents today for f/u  HYPERTENSION  Disease Monitoring  Home BP Monitoring not checking Chest pain- no    Dyspnea- no Medications  Compliance-  Taking amlodipine.   Edema- chronic in her left ankle for many years  HYPERLIPIDEMIA Symptoms Chest pain on exertion:  no    Medications: Compliance- taking lipitor Right upper quadrant pain- no  Muscle aches- no  Scalp lesion: Patient notes she had something similar about 4 years ago.  She was treated with something topical and it took care of this.  For the last several weeks she has noticed recurrence in her left mid scalp.  There is a bump.  No itching.  History of breast cancer: Currently on anastrozole.  Also wears a compression sleeve for her lymphedema.    Social History   Tobacco Use  Smoking Status Never Smoker  Smokeless Tobacco Never Used     ROS see history of present illness  Objective  Physical Exam Vitals:   11/03/19 1321  BP: 118/70  Pulse: 76  Temp: 98.1 F (36.7 C)  SpO2: 96%    BP Readings from Last 3 Encounters:  11/03/19 118/70  08/26/19 130/70  06/09/19 131/69   Wt Readings from Last 3 Encounters:  11/03/19 183 lb 6.4 oz (83.2 kg)  08/26/19 176 lb 14.4 oz (80.2 kg)  06/09/19 175 lb 12.8 oz (79.7 kg)    Physical Exam Constitutional:      General: She is not in acute distress.    Appearance: She is not diaphoretic.  HENT:     Head:   Cardiovascular:     Rate and Rhythm: Normal rate and regular rhythm.     Heart sounds: Normal heart sounds.  Pulmonary:     Effort: Pulmonary effort is normal.     Breath sounds: Normal breath sounds.  Skin:    General: Skin is warm and dry.  Neurological:     Mental Status: She is alert.      Assessment/Plan: Please see individual problem list.  Hypertension Adequate control.  Continue current regimen.  Malignant neoplasm of right female breast [facility]) She will  continue to follow with her multiple specialists.  HLD (hyperlipidemia) Continue Lipitor.  Adequately controlled.  Scalp lesion Possible seborrheic dermatitis.  It looks like she was previously treated for that and it improved.  We will trial ketoconazole and if not improving she will let us know.  We could refer to dermatology at that time.   No orders of the defined types were placed in this encounter.   Meds ordered this encounter  Medications  . ketoconazole (NIZORAL) 2 % shampoo    Sig: Apply 5 to 10 mL to wet scalp, lather, leave on 3 to 5 minutes, and rinse; apply twice weekly for 2 to 4 weeks.    Dispense:  120 mL    Refill:  0    This visit occurred during the SARS-CoV-2 public health emergency.  Safety protocols were in place, including screening questions prior to the visit, additional usage of staff PPE, and extensive cleaning of exam room while observing appropriate contact time as indicated for disinfecting solutions.    Tommi Rumps, MD [place]

## 2019-11-03 NOTE — Assessment & Plan Note (Signed)
Continue Lipitor.  Adequately controlled.

## 2019-11-03 NOTE — Patient Instructions (Signed)
Nice to see you. We will trial ketoconazole for the lesion on your scalp.  If it does not resolve in the next 2 weeks please let us know we can refer you to dermatology.

## 2019-11-03 NOTE — Assessment & Plan Note (Signed)
Possible seborrheic dermatitis.  It looks like she was previously treated for that and it improved.  We will trial ketoconazole and if not improving she will let us know.  We could refer to dermatology at that time.

## 2019-11-05 ENCOUNTER — Encounter: Payer: Self-pay | Admitting: Radiation Oncology

## 2019-11-05 ENCOUNTER — Ambulatory Visit
Admission: RE | Admit: 2019-11-05 | Discharge: 2019-11-05 | Disposition: A | Discharge status: Home / Self Care | Payer: Medicare Other | Source: Ambulatory Visit | Attending: Radiation Oncology | Admitting: Radiation Oncology

## 2019-11-05 VITALS — BP 159/85 | HR 89 | Temp 97.7°F | Resp 16 | Wt 182.6 lb

## 2019-11-05 DIAGNOSIS — Z923 Personal history of irradiation: Secondary | ICD-10-CM | POA: Diagnosis not present

## 2019-11-05 DIAGNOSIS — Z170 Estrogen receptor positive status [ER+]: Secondary | ICD-10-CM | POA: Insufficient documentation

## 2019-11-05 DIAGNOSIS — C50911 Malignant neoplasm of unspecified site of right female breast: Secondary | ICD-10-CM | POA: Insufficient documentation

## 2019-11-05 NOTE — Progress Notes (Signed)
Radiation Oncology Follow up Note  Name: Theresa Reid   Date:   11/05/2019 MRN:  [id number] DOB: 1940/10/14    This 79 y.o. 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  Seen today in routine follow-up she is doing well she specifically denies any chest wall pain.  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  She specifically denies cough bone pain or any new chest wall masses or nodularity.  She is currently on Arimidex tolerating that well without side effect..  She had a mammogram of the left breast back in February which was BI-RADS 3 probably benign.  COMPLICATIONS OF TREATMENT: none  FOLLOW UP COMPLIANCE: keeps appointments   PHYSICAL EXAM:  BP (!) 159/85 (BP Location: Left Arm, Patient Position: Sitting, Cuff Size: Normal)   Pulse 89   Temp 97.7 F (36.5 C) (Tympanic)   Resp 16   Wt 182 lb 9.6 oz (82.8 kg)   BMI 30.39 kg/m  Patient is status post right modified radical mastectomy chest wall is well-healed and clear.  Left breast is free of mass or nodularity.  No axillary or supraclavicular adenopathy is appreciated.  Well-developed well-nourished patient in NAD. HEENT reveals PERLA, EOMI, discs not visualized.  Oral cavity is clear. No oral mucosal lesions are identified. Neck is clear without evidence of cervical or supraclavicular adenopathy. Lungs are clear to A&P. Cardiac examination is essentially unremarkable with regular rate and rhythm without  murmur rub or thrill. Abdomen is benign with no organomegaly or masses noted. Motor sensory and DTR levels are equal and symmetric in the upper and lower extremities. Cranial nerves II through XII are grossly intact. Proprioception is intact. No peripheral adenopathy or edema is identified. No motor or sensory levels are noted. Crude visual fields are within normal range.  RADIOLOGY RESULTS: No current films to review  PLAN: Present time patient is doing well.  I am pleased with her overall progress.  I am going to asked to see her back in 1 year for follow-up.  She continues close follow-up care with medical oncology surgeon and per PMD.  Follow-up mammograms have been ordered.  Patient knows to call with any concerns.  I would like to take this opportunity to thank you for allowing me to participate in the care of your patient..Noreene Filbert MD

## 2019-11-18 ENCOUNTER — Other Ambulatory Visit: Payer: Self-pay | Admitting: General Surgery

## 2019-11-18 DIAGNOSIS — R920 Mammographic microcalcification found on diagnostic imaging of breast: Secondary | ICD-10-CM | POA: Diagnosis not present

## 2019-11-18 NOTE — Progress Notes (Signed)
[id number]

## 2019-12-07 ENCOUNTER — Other Ambulatory Visit: Payer: Self-pay | Admitting: *Deleted

## 2019-12-07 DIAGNOSIS — C50911 Malignant neoplasm of unspecified site of right female breast: Secondary | ICD-10-CM

## 2019-12-07 DIAGNOSIS — Z170 Estrogen receptor positive status [ER+]: Secondary | ICD-10-CM

## 2019-12-08 ENCOUNTER — Inpatient Hospital Stay: Payer: Medicare Other | Attending: Oncology

## 2019-12-08 ENCOUNTER — Inpatient Hospital Stay (HOSPITAL_BASED_OUTPATIENT_CLINIC_OR_DEPARTMENT_OTHER): Payer: Medicare Other | Admitting: Oncology

## 2019-12-08 ENCOUNTER — Encounter: Payer: Self-pay | Admitting: Oncology

## 2019-12-08 ENCOUNTER — Inpatient Hospital Stay: Payer: Medicare Other

## 2019-12-08 ENCOUNTER — Other Ambulatory Visit: Payer: Self-pay

## 2019-12-08 VITALS — BP 152/58 | HR 78 | Temp 98.8°F | Wt 181.0 lb

## 2019-12-08 DIAGNOSIS — C50911 Malignant neoplasm of unspecified site of right female breast: Secondary | ICD-10-CM

## 2019-12-08 DIAGNOSIS — C50411 Malignant neoplasm of upper-outer quadrant of right female breast: Secondary | ICD-10-CM | POA: Diagnosis not present

## 2019-12-08 DIAGNOSIS — Z9049 Acquired absence of other specified parts of digestive tract: Secondary | ICD-10-CM | POA: Insufficient documentation

## 2019-12-08 DIAGNOSIS — Z9221 Personal history of antineoplastic chemotherapy: Secondary | ICD-10-CM | POA: Insufficient documentation

## 2019-12-08 DIAGNOSIS — Z833 Family history of diabetes mellitus: Secondary | ICD-10-CM | POA: Diagnosis not present

## 2019-12-08 DIAGNOSIS — I890 Lymphedema, not elsewhere classified: Secondary | ICD-10-CM | POA: Insufficient documentation

## 2019-12-08 DIAGNOSIS — M1990 Unspecified osteoarthritis, unspecified site: Secondary | ICD-10-CM | POA: Diagnosis not present

## 2019-12-08 DIAGNOSIS — G629 Polyneuropathy, unspecified: Secondary | ICD-10-CM | POA: Diagnosis not present

## 2019-12-08 DIAGNOSIS — Z811 Family history of alcohol abuse and dependence: Secondary | ICD-10-CM | POA: Diagnosis not present

## 2019-12-08 DIAGNOSIS — Z803 Family history of malignant neoplasm of breast: Secondary | ICD-10-CM | POA: Insufficient documentation

## 2019-12-08 DIAGNOSIS — Z7983 Long term (current) use of bisphosphonates: Secondary | ICD-10-CM

## 2019-12-08 DIAGNOSIS — Z170 Estrogen receptor positive status [ER+]: Secondary | ICD-10-CM

## 2019-12-08 DIAGNOSIS — Z79899 Other long term (current) drug therapy: Secondary | ICD-10-CM | POA: Diagnosis not present

## 2019-12-08 DIAGNOSIS — Z95828 Presence of other vascular implants and grafts: Secondary | ICD-10-CM

## 2019-12-08 DIAGNOSIS — Z923 Personal history of irradiation: Secondary | ICD-10-CM | POA: Diagnosis not present

## 2019-12-08 DIAGNOSIS — Z79811 Long term (current) use of aromatase inhibitors: Secondary | ICD-10-CM | POA: Insufficient documentation

## 2019-12-08 LAB — CBC WITH DIFFERENTIAL/PLATELET
Abs Immature Granulocytes: 0.01 10*3/uL (ref 0.00–0.07)
Basophils Absolute: 0 10*3/uL (ref 0.0–0.1)
Basophils Relative: 0 %
Eosinophils Absolute: 0.3 10*3/uL (ref 0.0–0.5)
Eosinophils Relative: 5 %
HCT: 42 % (ref 36.0–46.0)
Hemoglobin: 14.2 g/dL (ref 12.0–15.0)
Immature Granulocytes: 0 %
Lymphocytes Relative: 35 %
Lymphs Abs: 2.5 10*3/uL (ref 0.7–4.0)
MCH: 30.4 pg (ref 26.0–34.0)
MCHC: 33.8 g/dL (ref 30.0–36.0)
MCV: 89.9 fL (ref 80.0–100.0)
Monocytes Absolute: 0.6 10*3/uL (ref 0.1–1.0)
Monocytes Relative: 9 %
Neutro Abs: 3.7 10*3/uL (ref 1.7–7.7)
Neutrophils Relative %: 51 %
Platelets: 196 10*3/uL (ref 150–400)
RBC: 4.67 MIL/uL (ref 3.87–5.11)
RDW: 13.3 % (ref 11.5–15.5)
WBC: 7.1 10*3/uL (ref 4.0–10.5)
nRBC: 0 % (ref 0.0–0.2)

## 2019-12-08 LAB — COMPREHENSIVE METABOLIC PANEL
ALT: 16 U/L (ref 0–44)
AST: 20 U/L (ref 15–41)
Albumin: 3.9 g/dL (ref 3.5–5.0)
Alkaline Phosphatase: 88 U/L (ref 38–126)
Anion gap: 7 (ref 5–15)
BUN: 9 mg/dL (ref 8–23)
CO2: 27 mmol/L (ref 22–32)
Calcium: 9 mg/dL (ref 8.9–10.3)
Chloride: 107 mmol/L (ref 98–111)
Creatinine, Ser: 0.5 mg/dL (ref 0.44–1.00)
GFR calc Af Amer: >60 mL/min (ref >60)
GFR calc non Af Amer: >60 mL/min (ref >60)
Glucose, Bld: 101 mg/dL — ABNORMAL HIGH (ref 70–99)
Potassium: 4.3 mmol/L (ref 3.5–5.1)
Sodium: 141 mmol/L (ref 135–145)
Total Bilirubin: 0.5 mg/dL (ref 0.3–1.2)
Total Protein: 7.1 g/dL (ref 6.5–8.1)

## 2019-12-08 MED ORDER — HEPARIN SOD (PORK) LOCK FLUSH 100 UNIT/ML IV SOLN
500.0000 [IU] | Freq: Once | INTRAVENOUS | Status: AC | PRN
  Administered 2019-12-08: 500 [IU]
  Filled 2019-12-08: qty 5

## 2019-12-08 MED ORDER — HEPARIN SOD (PORK) LOCK FLUSH 100 UNIT/ML IV SOLN
INTRAVENOUS | Status: AC
  Filled 2019-12-08: qty 5

## 2019-12-08 MED ORDER — ZOLEDRONIC ACID 4 MG/100ML IV SOLN
4.0000 mg | Freq: Once | INTRAVENOUS | Status: AC
  Administered 2019-12-08: 4 mg via INTRAVENOUS
  Filled 2019-12-08: qty 100

## 2019-12-08 MED ORDER — SODIUM CHLORIDE 0.9% FLUSH
10.0000 mL | Freq: Once | INTRAVENOUS | Status: AC
  Administered 2019-12-08: 10 mL
  Filled 2019-12-08: qty 10

## 2019-12-08 MED ORDER — SODIUM CHLORIDE 0.9 % IV SOLN
Freq: Once | INTRAVENOUS | Status: AC
  Filled 2019-12-08: qty 250

## 2019-12-08 NOTE — Progress Notes (Signed)
Hematology/Oncology Consult note [facility]  [phone] [phone]  Patient Care Team: Leone Haven, MD as PCP - General (Family Medicine) Sindy Guadeloupe, MD as Consulting Physician (Oncology) Rico Junker, RN as Registered Nurse Bary Castilla, Forest Gleason, MD as Consulting Physician (General Surgery) Noreene Filbert, MD as Referring Physician (Radiation Oncology)   Name of the patient: Theresa Reid  [id number]  [birth year]   Date of visit: 12/08/19  Diagnosis- stage IIIa invasive mammary carcinoma of the right breast pathological prognostic stage T2 N3 aM0 ER PR positive HER-2/neu negative status postmastectomy and axillary lymph node dissectionand adjuvant Taxol chemotherapy   Chief complaint/ Reason for visit-routine follow-up of breast cancer and to receive Zometa  Heme/Onc history: Patient is a 79 year old female who underwent a screening mammogram in January 2020 which showed a breast mass of about 1 cm and normal-appearing axilla which was biopsied and showed 4 mm grade 1 ER PR positive and HER-2/neu negative invasive mammary seroma. Patient underwent lumpectomy and sentinel lymph node biopsy in February 2020. Pathology showed invasive lobular carcinoma with positive medial margin which was reexcised with still positive. Tumor was 24 mm, grade 2 ER PR positive and HER-2/neu negative. One sentinel lymph node that was excised was positive for macro met of 8 mm. No extranodal extension was present. MammaPrint came back as high risk with an average 10-year risk of untreated disease is 29%. Patient then underwent a bilateral MRI which showed additional suspicious non-mass enhancement along the inferior margin of the biopsy cavity extending 2 cm highly suspicious for continuous disease and another highly suspicious linear non-mass enhancement at the base of the nipple concerning for multifocal multicentric disease. Patient  underwent right mastectomy and axillary lymph node dissectionon 09/20/2018. There was no residual invasive mammary carcinoma within the breast. However 15 out of 20 lymph nodes were involved with metastatic carcinoma measuring up to 6 mm and remaining 5 lymph nodes with isolated tumor cells. Pathologic stage was [id number]  Despite having surgery in May 2020 which was a second surgery-patient has not been able to start adjuvant chemotherapy. Her mastectomy was complicated by flap necrosis requiring debridement antibiotics and she had to be taken back to the OR for the same. Given her age and postmastectomy complications and delayed wound healing-weekly Taxol x12 cycles was chosen as the regimen for her adjuvant chemotherapywhich she completed on 02/25/2019.  Patient started Arimidex in December 2020 and also received dental clearance to start Zometa. She completed adjuvant radiation treatment   Interval history-patient reports doing well.  Appetite and weight have remained stable.  She continues to use lymphedema sleeve over her right upper extremity.  Neuropathy is almost resolving  ECOG PS- 1 Pain scale: 0  Review of systems- Review of Systems  Constitutional: Negative for chills, fever, malaise/fatigue and weight loss.  HENT: Negative for congestion, ear discharge and nosebleeds.   Eyes: Negative for blurred vision.  Respiratory: Negative for cough, hemoptysis, sputum production, shortness of breath and wheezing.   Cardiovascular: Negative for chest pain, palpitations, orthopnea and claudication.  Gastrointestinal: Negative for abdominal pain, blood in stool, constipation, diarrhea, heartburn, melena, nausea and vomiting.  Genitourinary: Negative for dysuria, flank pain, frequency, hematuria and urgency.  Musculoskeletal: Negative for back pain, joint pain and myalgias.  Skin: Negative for rash.  Neurological: Negative for dizziness, tingling, focal weakness, seizures, weakness and  headaches.  Endo/Heme/Allergies: Does not bruise/bleed easily.  Psychiatric/Behavioral: Negative for depression and suicidal ideas. The patient does not  have insomnia.       No Known Allergies   Past Medical History:  Diagnosis Date   Arthritis    Breast cancer ([place])    Diverticulitis    Family history of breast cancer    GERD (gastroesophageal reflux disease)    Hyperlipidemia    Hypertension    Personal history of chemotherapy    Personal history of radiation therapy      Past Surgical History:  Procedure Laterality Date   BREAST BIOPSY Right 05/28/2018   [place] bx, INVASIVE MAMMARY CARCINOMA WITH LOBULAR FEATURES   BREAST LUMPECTOMY Right 06/12/2018   [place], lobular features   BREAST LUMPECTOMY WITH SENTINEL LYMPH NODE BIOPSY Right 06/12/2018   Procedure: RIGHT BREAST WIDE EXCISION WITH SENTINEL LYMPH NODE BX;  Surgeon: Robert Bellow, MD;  Location: [facility] ORS;  Service: General;  Laterality: Right;   [place] Right 10/30/2018   Procedure: IRRIGATION AND DEBRIDEMENT RIGHT CHEST WALL WOUND;  Surgeon: Robert Bellow, MD;  Location: [facility] ORS;  Service: General;  Laterality: Right;   JOINT REPLACEMENT     MASTECTOMY Right    MASTECTOMY WITH AXILLARY LYMPH NODE DISSECTION Right 09/20/2018   Procedure: MASTECTOMY WITH AXILLARY LYMPH NODE DISSECTION RIGHT;  Surgeon: Robert Bellow, MD;  Location: [facility] ORS;  Service: General;  Laterality: Right;   ovaraian cyst removal Right    Cyst only (NOT OVARY)   PORTACATH PLACEMENT Left 09/20/2018   Procedure: INSERTION PORT-A-CATH, LEFT;  Surgeon: Robert Bellow, MD;  Location: [facility] ORS;  Service: General;  Laterality: Left;   TOTAL KNEE ARTHROPLASTY Bilateral 05/05/2010    Social History   Socioeconomic History   Marital status: Single    Spouse name: Not on file   Number of children: Not on file   Years of education: Not on file    Highest education level: Not on file  Occupational History   Occupation: retired  Tobacco Use   Smoking status: Never Smoker   Smokeless tobacco: Never Used  Scientific laboratory technician Use: Never used  Substance and Sexual Activity   Alcohol use: No    Alcohol/week: 0.0 standard drinks   Drug use: No   Sexual activity: Never  Other Topics Concern   Not on file  Social History Narrative   Retired    Lives by herself    Pets: None   Caffeine- Coffee 2 cups daily, no tea/soda      Social Determinants of Radio broadcast assistant Strain: Low Risk    Difficulty of Paying Living Expenses: Not hard at all  Food Insecurity: No Food Insecurity   Worried About Charity fundraiser in the Last Year: Never true   Arboriculturist in the Last Year: Never true  Transportation Needs: No Transportation Needs   Lack of Transportation (Medical): No   Lack of Transportation (Non-Medical): No  Physical Activity:    Days of Exercise per Week:    Minutes of Exercise per Session:   Stress: No Stress Concern Present   Feeling of Stress : Not at all  Social Connections: Unknown   Frequency of Communication with Friends and Family: More than three times a week   Frequency of Social Gatherings with Friends and Family: More than three times a week   Attends Religious Services: Not on file   Active Member of Clubs or Organizations: Not on file  Attends Archivist Meetings: Not on file   Marital Status: Not on file  Intimate Partner Violence: Not At Risk   Fear of Current or Ex-Partner: No   Emotionally Abused: No   Physically Abused: No   Sexually Abused: No    Family History  Problem Relation Age of Onset   Alcohol abuse Mother        deceased 18   Diabetes Father    Breast cancer Maternal Aunt        dx 79s; deceased 26s   Breast cancer Maternal Aunt        dx 49s; deceased 5s     Current Outpatient Medications:    oxybutynin (DITROPAN) 5 MG  tablet, TAKE 1 TABLET BY MOUTH  TWICE DAILY, Disp: 180 tablet, Rfl: 3   Turmeric 500 MG CAPS, Take 1,000 mg by mouth 2 (two) times daily. , Disp: , Rfl:    amLODipine (NORVASC) 5 MG tablet, TAKE 1 TABLET BY MOUTH  DAILY, Disp: 90 tablet, Rfl: 3   anastrozole (ARIMIDEX) 1 MG tablet, TAKE 1 TABLET BY MOUTH  DAILY DO NOT START UNTIL  COMPLETED RADIATION IN JAN  2021, Disp: 90 tablet, Rfl: 3   atorvastatin (LIPITOR) 80 MG tablet, TAKE 1 TABLET BY MOUTH  DAILY, Disp: 90 tablet, Rfl: 3   CALCIUM-VITAMIN D PO, Take 1 tablet by mouth daily. , Disp: , Rfl:    CRANBERRY PO, Take 1 tablet by mouth 2 (two) times daily. , Disp: , Rfl:    ketoconazole (NIZORAL) 2 % shampoo, Apply 5 to 10 mL to wet scalp, lather, leave on 3 to 5 minutes, and rinse; apply twice weekly for 2 to 4 weeks., Disp: 120 mL, Rfl: 0   Multiple Vitamins tablet, Take 1 tablet by mouth daily. , Disp: , Rfl:    Omega-3 Fatty Acids (FISH OIL PO), Take 1 capsule by mouth daily.  (Patient not taking: Reported on 12/08/2019), Disp: , Rfl:   Physical exam:  Vitals:   12/08/19 1324  BP: (!) 152/58  Pulse: 78  Temp: 98.8 F (37.1 C)  TempSrc: Tympanic  SpO2: 96%  Weight: 181 lb (82.1 kg)   Physical Exam Constitutional:      Comments: Lymphedema sleeve over the right upper extremity  Cardiovascular:     Rate and Rhythm: Normal rate and regular rhythm.     Heart sounds: Normal heart sounds.  Pulmonary:     Effort: Pulmonary effort is normal.     Breath sounds: Normal breath sounds.  Abdominal:     General: Bowel sounds are normal.     Palpations: Abdomen is soft.  Skin:    General: Skin is warm and dry.  Neurological:     Mental Status: She is alert and oriented to person, place, and time.   Breast: Patient is s/p right mastectomy without reconstruction.  No evidence of chest wall recurrence.  No palpable masses in the left breast.  CMP Latest Ref Rng & Units 12/08/2019  Glucose 70 - 99 mg/dL 101(H)  BUN 8 - 23 mg/dL 9    Creatinine 0.44 - 1.00 mg/dL 0.50  Sodium 135 - 145 mmol/L 141  Potassium 3.5 - 5.1 mmol/L 4.3  Chloride 98 - 111 mmol/L 107  CO2 22 - 32 mmol/L 27  Calcium 8.9 - 10.3 mg/dL 9.0  Total Protein 6.5 - 8.1 g/dL 7.1  Total Bilirubin 0.3 - 1.2 mg/dL 0.5  Alkaline Phos 38 - 126 U/L 88  AST 15 -  41 U/L 20  ALT 0 - 44 U/L 16   CBC Latest Ref Rng & Units 12/08/2019  WBC 4.0 - 10.5 K/uL 7.1  Hemoglobin 12.0 - 15.0 g/dL 14.2  Hematocrit 36 - 46 % 42.0  Platelets 150 - 400 K/uL 196      Assessment and plan- Patient is a 79 y.o. female with invasive mammary carcinoma of the right breast pathological prognostic stage III [id number] cNX ER PR positive HER-2/neu negative status post right mastectomy and axillary lymph node dissection.  She completed 12 cycles of weekly Taxol chemotherapy and adjuvant radiation treatment.    She is here for routine follow-up of breast cancer and for next dose of Zometa  1.  Serum calcium and kidney levels acceptable to proceed with Zometa today.  I will see her back in 6 months for the next dose of Zometa.  She will receive it every 6 months for up to 5 years  2.  Patient will continue to take Arimidex along with calcium and vitamin D.  Screening left breast mammogram to be coordinated by Dr. Bary Castilla.  Clinically patient is doing well with no concerning signs and symptoms of recurrence based on today's exam.   Visit Diagnosis 1. Malignant neoplasm of right breast in female, estrogen receptor positive, unspecified site of breast ([place])   2. Long term (current) use of bisphosphonates      Dr. Randa Evens, MD, MPH [facility] at [facility] [phone] 12/08/2019 2:30 PM

## 2019-12-09 IMAGING — CT CT CHEST WITH CONTRAST
2 of 5 series · 12 of 36 positions shown, 15 images · IV contrast (omnipaque)
Comparison: None.

CLINICAL DATA: Right breast cancer diagnosed in April 2018. Right
mastectomy and axillary node dissection 09/20/2018. No current
complaints.

EXAM:
CT CHEST, ABDOMEN, AND PELVIS WITH CONTRAST
TECHNIQUE: Multidetector CT imaging of the chest, abdomen and pelvis was
performed following the standard protocol during bolus
administration of intravenous contrast.
CONTRAST:  100mL OMNIPAQUE IOHEXOL 300 MG/ML  SOLN

[Series 2: cap with · axial · 0.71mm/px · z∈[-894,-384]mm · 9 of 128 slices shown, 12 images]
[im 13/128  mediastinal]
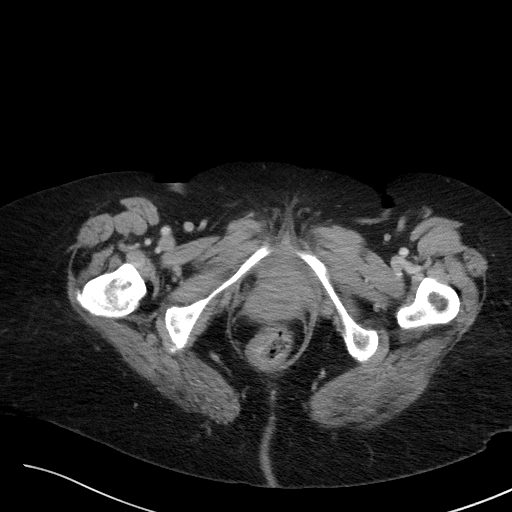
[im 13/128  lung]
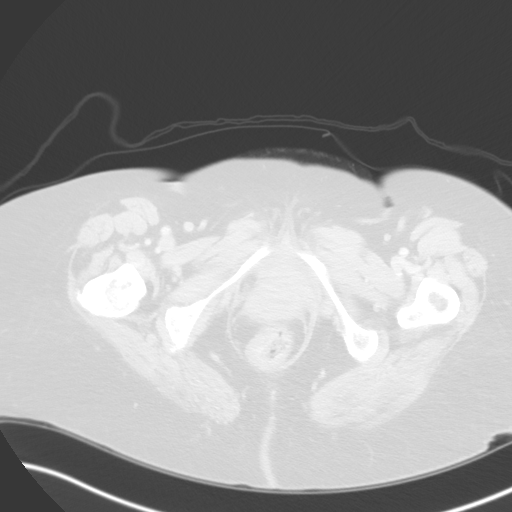
[im 26/128  lung]
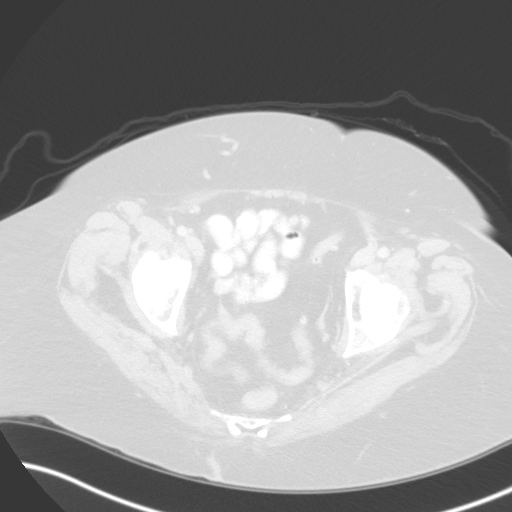
[im 39/128  lung]
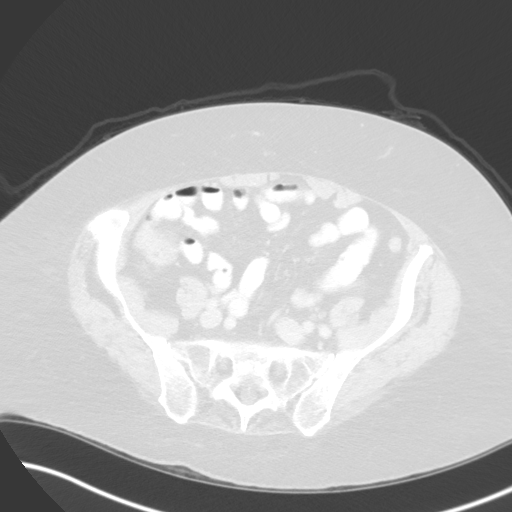
[im 51/128  lung]
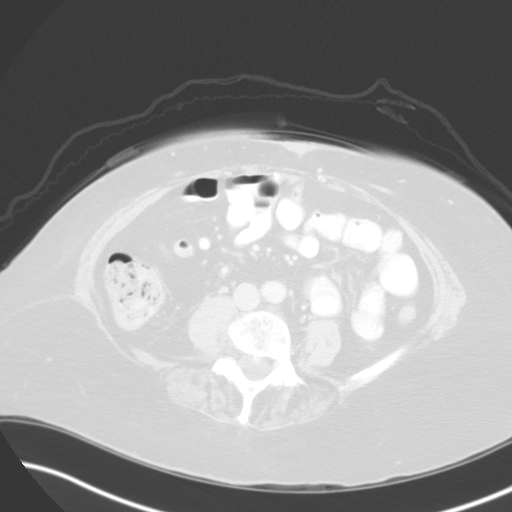
[im 64/128  mediastinal]
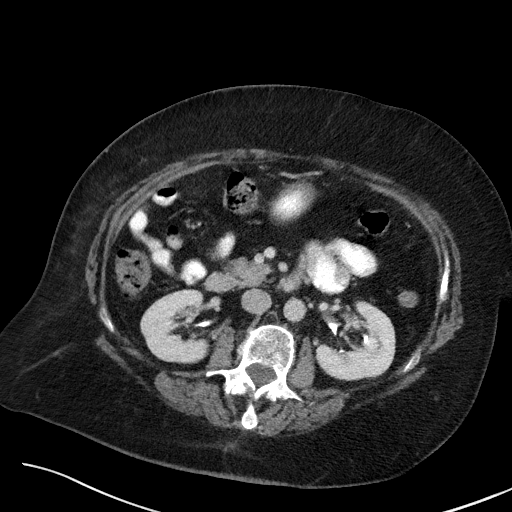
[im 64/128  lung]
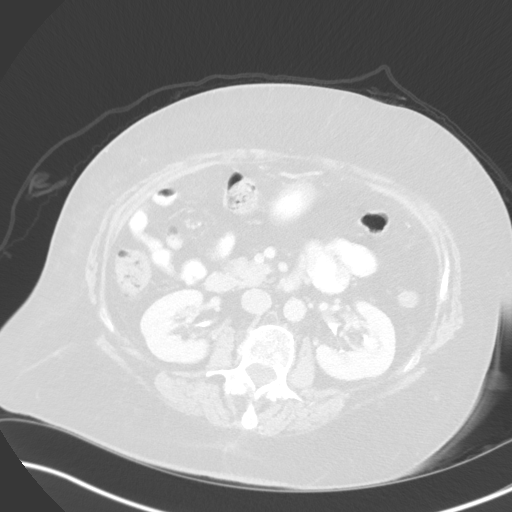
[im 77/128  lung]
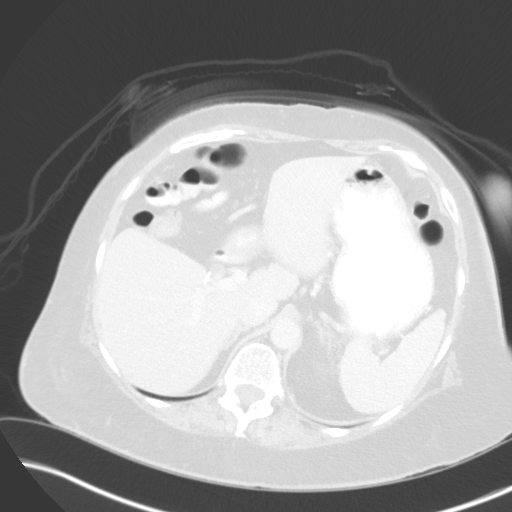
[im 89/128  lung]
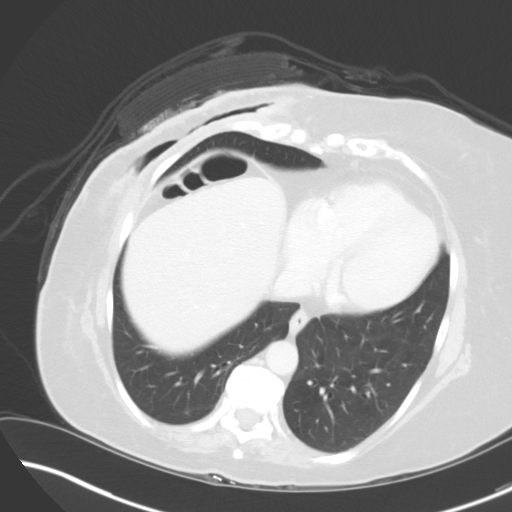
[im 102/128  lung]
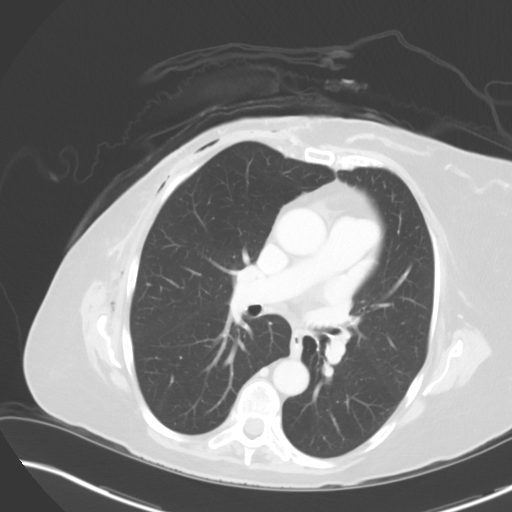
[im 115/128  mediastinal]
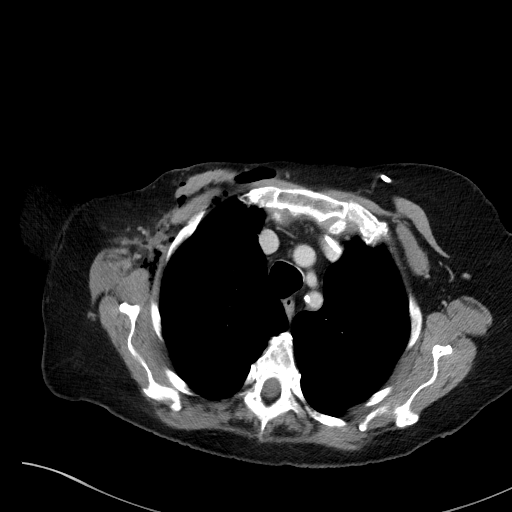
[im 115/128  lung]
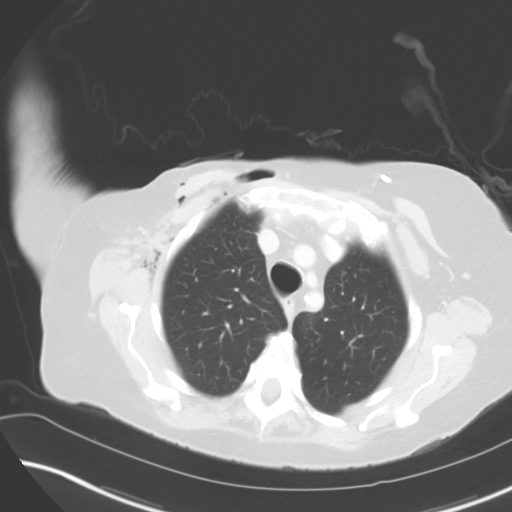

[Series 5: coronals · coronal · 0.82mm/px · 3 of 143 slices shown]
[im 29/143  lung]
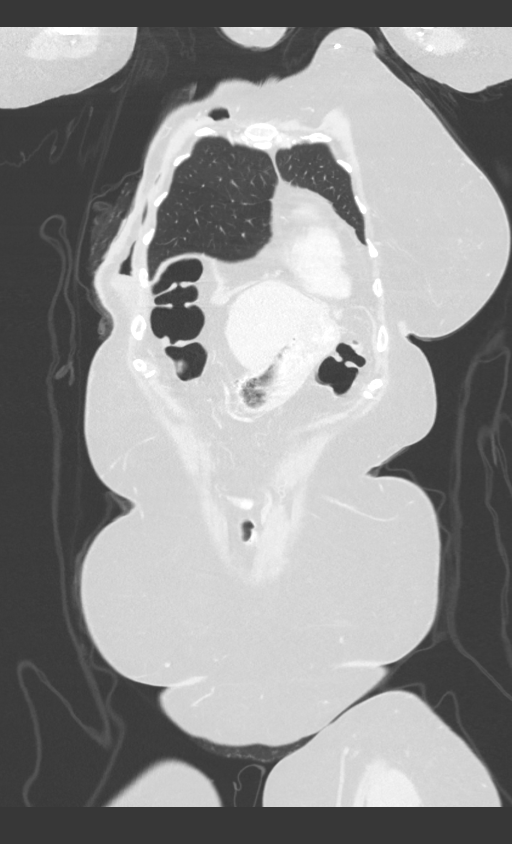
[im 57/143  lung]
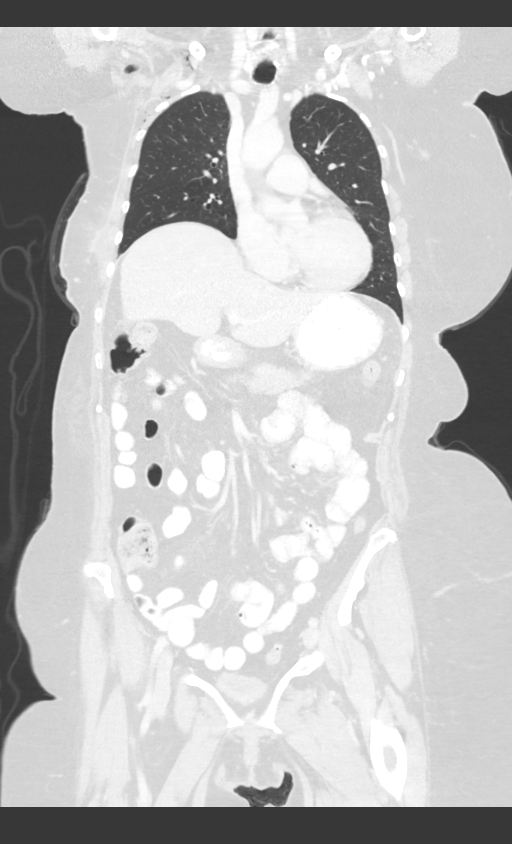
[im 86/143  lung]
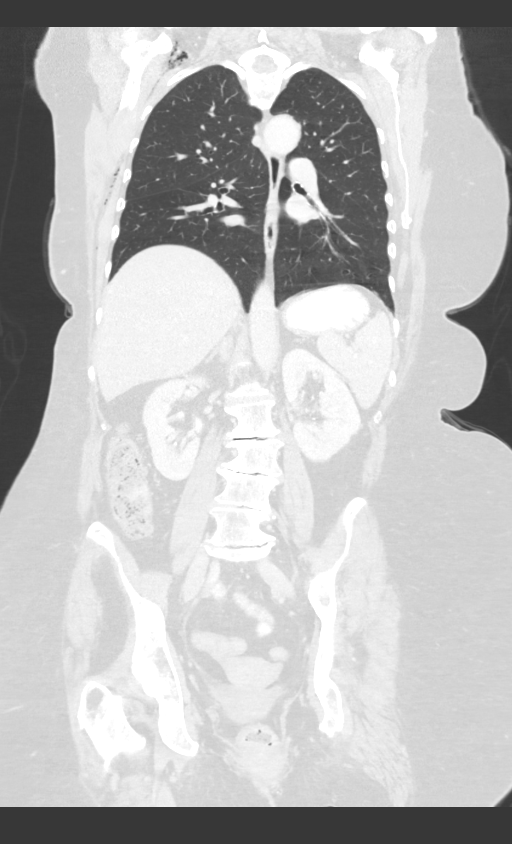

[12 of 36 positions shown; findings below may reference images not displayed]

FINDINGS: CT CHEST FINDINGS

Cardiovascular: Atherosclerosis of the aorta, great vessels and
coronary arteries. There is a left subclavian Port-A-Cath with its
tip terminating near the confluence of the brachiocephalic veins.
The heart size is normal. There is no pericardial effusion.

Mediastinum/Nodes: There are no enlarged mediastinal, hilar,
axillary or internal mammary lymph nodes. Postsurgical changes are
present within the right anterior chest wall and right axilla
related to recent mastectomy and axillary node dissection. There is
soft tissue emphysema throughout the right chest wall extending into
the axilla. There is a small amount ill-defined fluid in the right
axilla. The thyroid gland, trachea and esophagus demonstrate no
significant findings.

Lungs/Pleura: There is no pleural effusion or pneumothorax. There is
mild linear atelectasis or scarring at both lung bases. No
suspicious pulmonary nodules.

Musculoskeletal/Chest wall: No chest wall mass or suspicious osseous
findings. Postsurgical changes in the right chest wall and axilla
with associated soft tissue emphysema as noted above.

CT ABDOMEN AND PELVIS FINDINGS

Hepatobiliary: The liver is normal in density without suspicious
focal abnormality. Probable subcentimeter hepatic cysts on images 44
and 53 of series 2. no significant biliary dilatation post
cholecystectomy.

Pancreas: Unremarkable. No pancreatic ductal dilatation or
surrounding inflammatory changes.

Spleen: Normal in size without focal abnormality.

Adrenals/Urinary Tract: Both adrenal glands appear normal. Probable
tiny cyst in the lower pole of the right kidney. No evidence of
renal mass, urinary tract calculus or hydronephrosis. Mild bladder
wall thickening, possibly due to incomplete bladder distention. No
focal abnormality.

Stomach/Bowel: No evidence of bowel wall thickening, distention or
surrounding inflammatory change.

Vascular/Lymphatic: There are no enlarged abdominal or pelvic lymph
nodes. Mild aortic and branch vessel atherosclerosis.

Reproductive: The uterus and ovaries appear normal. No adnexal mass.
Suspected mild pelvic floor laxity.

Other: No ascites or peritoneal nodularity. No significant abdominal
wall soft tissue emphysema.

Musculoskeletal: No acute or significant osseous findings. Mild
scoliosis, lumbar spondylosis and osteitis pubis.
IMPRESSION: 1. No evidence of metastatic disease in the chest, abdomen or
pelvis.
2. Postsurgical changes from recent right mastectomy and axillary
node dissection. There is prominent soft tissue emphysema in the
right chest wall and a small residual fluid collection in the
axilla.
3. Small probable hepatic and right renal cysts. Aortic
Atherosclerosis ([id number]).

## 2019-12-11 ENCOUNTER — Other Ambulatory Visit: Payer: Medicare Other

## 2019-12-23 ENCOUNTER — Ambulatory Visit
Admission: RE | Admit: 2019-12-23 | Discharge: 2019-12-23 | Disposition: A | Discharge status: Home / Self Care | Payer: Medicare Other | Source: Ambulatory Visit | Attending: General Surgery | Admitting: General Surgery

## 2019-12-23 ENCOUNTER — Other Ambulatory Visit: Payer: Self-pay

## 2019-12-23 DIAGNOSIS — R920 Mammographic microcalcification found on diagnostic imaging of breast: Secondary | ICD-10-CM | POA: Diagnosis not present

## 2019-12-23 DIAGNOSIS — R921 Mammographic calcification found on diagnostic imaging of breast: Secondary | ICD-10-CM | POA: Diagnosis not present

## 2020-01-07 ENCOUNTER — Encounter: Payer: Medicare Other | Admitting: Occupational Therapy

## 2020-01-09 ENCOUNTER — Other Ambulatory Visit: Payer: Self-pay

## 2020-01-09 ENCOUNTER — Ambulatory Visit: Payer: Medicare Other | Attending: Oncology | Admitting: Occupational Therapy

## 2020-01-09 DIAGNOSIS — L905 Scar conditions and fibrosis of skin: Secondary | ICD-10-CM | POA: Diagnosis not present

## 2020-01-09 DIAGNOSIS — I972 Postmastectomy lymphedema syndrome: Secondary | ICD-10-CM | POA: Insufficient documentation

## 2020-01-09 NOTE — Therapy (Signed)
[facility] [address], [place], [address] Phone: [phone]   Fax:  [phone]  Occupational Therapy Treatment  Patient Details  Name: Theresa Reid MRN: [id number] Date of Birth: 1940/09/21 No data recorded  Encounter Date: 01/09/2020   OT End of Session - 01/09/20 1026    Visit Number 11    Number of Visits 13    Date for OT Re-Evaluation 04/08/20    OT Start Time 0945    OT Stop Time 1019    OT Time Calculation (min) 34 min    Activity Tolerance Patient tolerated treatment well    Behavior During Therapy [facility] for tasks assessed/performed           Past Medical History:  Diagnosis Date  . Arthritis   . Breast cancer ([place])   . Diverticulitis   . Family history of breast cancer   . GERD (gastroesophageal reflux disease)   . Hyperlipidemia   . Hypertension   . Personal history of chemotherapy   . Personal history of radiation therapy     Past Surgical History:  Procedure Laterality Date  . BREAST BIOPSY Right 05/28/2018   [place] bx, INVASIVE MAMMARY CARCINOMA WITH LOBULAR FEATURES  . BREAST LUMPECTOMY Right 06/12/2018   [facility], lobular features  . BREAST LUMPECTOMY WITH SENTINEL LYMPH NODE BIOPSY Right 06/12/2018   Procedure: RIGHT BREAST WIDE EXCISION WITH SENTINEL LYMPH NODE BX;  Surgeon: Robert Bellow, MD;  Location: [facility] ORS;  Service: General;  Laterality: Right;  . CHOLECYSTECTOMY    . [place]  . INCISION AND DRAINAGE OF WOUND Right 10/30/2018   Procedure: IRRIGATION AND DEBRIDEMENT RIGHT CHEST WALL WOUND;  Surgeon: Robert Bellow, MD;  Location: [facility] ORS;  Service: General;  Laterality: Right;  . JOINT REPLACEMENT    . MASTECTOMY Right   . MASTECTOMY WITH AXILLARY LYMPH NODE DISSECTION Right 09/20/2018   Procedure: MASTECTOMY WITH AXILLARY LYMPH NODE DISSECTION RIGHT;  Surgeon: Robert Bellow, MD;  Location: [facility] ORS;  Service: General;  Laterality: Right;    . ovaraian cyst removal Right    Cyst only (NOT OVARY)  . PORTACATH PLACEMENT Left 09/20/2018   Procedure: INSERTION PORT-A-CATH, LEFT;  Surgeon: Robert Bellow, MD;  Location: [facility] ORS;  Service: General;  Laterality: Left;  . TOTAL KNEE ARTHROPLASTY Bilateral 05/05/2010    There were no vitals filed for this visit.   Subjective Assessment - 01/09/20 1023    Subjective  I am doing better wtih the arthritis  -doing those exercises you gave me - but I feel like arm is doing okay - order those tubigrip I use at night time on [employer] - so I am good there - but need maybe my daysleeve replace one of these days    Pertinent History Patient had her original lumpectomy and sentinel lymph node biopsy back in February 2020 R breast   She then had an MRI which showed more areas of enhancement and was taken back to the OR for mastectomy and axillary lymph node dissection on 09/20/2018 which showed 15 of the lymph nodes were also positive for malignancy and this upstaged her to stage IIIa disease.  Plan was to offer her adjuvant chemotherapy following this.  However her mastectomy course was complicated by flap necrosis requiring debridement antibiotics and she had to be also taken to the OR on 10/30/2018.  Her wound has not completely healed yet and she still has a  drain in place.  She seen  Dr. Dahlia Byes on 11/18/2018.  There is concern for early cellulitis of her chest wall.  Dr. Dahlia Byes after his visit started  patient on Augmentin for 2 more weeks which further delays her chemotherapy. Lymphedema in R UE per pt developed after surgery on 10/30/2018 - stayed the same - not going down - denies pain    Patient Stated Goals I want to get sleeve so my arm don't  get big and to  prevent infection    Currently in Pain? Yes    Pain Score 3     Pain Location Shoulder    Pain Orientation Right    Pain Descriptors / Indicators Aching;Tightness    Pain Type Chronic pain    Pain Onset More than a month ago    Pain Frequency  Intermittent               LYMPHEDEMA/ONCOLOGY QUESTIONNAIRE - 01/09/20 0001      Right Upper Extremity Lymphedema   15 cm Proximal to Olecranon Process 36 cm    10 cm Proximal to Olecranon Process 36 cm    Olecranon Process 23.8 cm    15 cm Proximal to Ulnar Styloid Process 21.5 cm    10 cm Proximal to Ulnar Styloid Process 19.4 cm    Just Proximal to Ulnar Styloid Process 14.4 cm    Across Hand at [employer] 17.8 cm    At [place] of 2nd Digit 5.8 cm    At [facility] of Thumb 5.8 cm      Left Upper Extremity Lymphedema   10 cm Proximal to Olecranon Process 35.4 cm    Olecranon Process 25 cm    15 cm Proximal to Ulnar Styloid Process 21 cm            Pt Jobst Elvarex soft sleeve and glove still fitting well - although pt says it is not as tight on upper arm - sleeve do not slide and containing her R UE lymphedema  Measurements looking great -decrease some again -  see flowsheet  She ordered roll of tubigrip D for arm -and could explain why she decrease more on upper arm - she was E on upper arm and D on forearm  But working well for her at night - to cont with   And then discuss with pt that day sleeve still fitting well and glove  Need to be replace about 6-8 months - she just got it end of May To call me early December to get measured for replacement for daytime sleeve and glove                 OT Education - 01/09/20 1026    Education Details pt to contact me in early Dec to replace sleeve    Person(s) Educated Patient    Methods Explanation;Demonstration;Tactile cues;Verbal cues;Handout    Comprehension Returned demonstration;Verbalized understanding            OT Short Term Goals - 06/30/19 1337      OT SHORT TERM GOAL #1   Title Pt's R UE circumference decrease by at least 1cm -2 cm from hand to upper arm - to be fitted for appropriate compression garments    Status Achieved             OT Long Term Goals - 01/09/20 1029      OT LONG  TERM GOAL #1   Title Pt report decrease pain  and stiffness in R shoulder in the am and increase cervical AROM    Baseline no pain this am in cervical only some stiffness- but increase AROM    Status Achieved      OT LONG TERM GOAL #2   Title Pt maintain her L UE lymphedema  circumference with correct compression garments thru out her CA treatment    Status Achieved                 Plan - 01/09/20 1027    Clinical Impression Statement Pt seen for R UE lymphedema -she is doing great maintaining her lymphedema and circumference in R UE - pt order her tubigrip online to replace as needed night time -and then Jobst Elvarex soft sleeve and glove still good for another 3 months - measurements great - pt to contact me early Dec about replacing daytime sleeve    OT Occupational Profile and History Problem Focused Assessment - Including review of records relating to presenting problem    Occupational performance deficits (Please refer to evaluation for details): ADL's;Play;Leisure;IADL's    Body Structure / Function / Physical Skills ADL;UE functional use;Scar mobility;Skin integrity;Edema    Rehab Potential Good    Clinical Decision Making Several treatment options, min-mod task modification necessary    Comorbidities Affecting Occupational Performance: May have comorbidities impacting occupational performance    Modification or Assistance to Complete Evaluation  No modification of tasks or assist necessary to complete eval    OT Frequency --   2 visits   OT Duration 12 weeks    OT Treatment/Interventions Self-care/ADL training;Manual lymph drainage;Therapeutic exercise;Patient/family education;Scar mobilization;Manual Therapy    Consulted and Agree with Plan of Care Patient           Patient will benefit from skilled therapeutic intervention in order to improve the following deficits and impairments:   Body Structure / Function / Physical Skills: ADL, UE functional use, Scar mobility,  Skin integrity, Edema       Visit Diagnosis: Postmastectomy lymphedema syndrome  Scar condition and fibrosis of skin    Problem List Patient Active Problem List   Diagnosis Date Noted  . Scalp lesion 11/03/2019  . Cataract 09/24/2018  . Urge incontinence of urine 09/24/2018  . Malignant neoplasm of right female breast ([place]) 06/07/2018  . Goals of care, counseling/discussion 06/07/2018  . Family history of breast cancer   . Dairy product intolerance 01/14/2018  . Seborrheic keratosis 04/10/2017  . Dysuria 02/19/2017  . Visual disturbance 10/03/2016  . GERD (gastroesophageal reflux disease) 09/14/2016  . HLD (hyperlipidemia) 08/13/2015  . Hypertension 11/13/2014  . 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    Rosalyn Gess OTR/L,CLT 01/09/2020, 10:31 AM  [facility] [address] [place], [address] Phone: [phone]   Fax:  [phone]  Name: Theresa Reid MRN: [id number] Date of Birth: 10-01-1940

## 2020-03-01 ENCOUNTER — Inpatient Hospital Stay: Payer: Medicare Other | Attending: Oncology

## 2020-03-01 DIAGNOSIS — Z452 Encounter for adjustment and management of vascular access device: Secondary | ICD-10-CM | POA: Diagnosis not present

## 2020-03-01 DIAGNOSIS — C50911 Malignant neoplasm of unspecified site of right female breast: Secondary | ICD-10-CM | POA: Insufficient documentation

## 2020-03-01 DIAGNOSIS — Z95828 Presence of other vascular implants and grafts: Secondary | ICD-10-CM

## 2020-03-01 DIAGNOSIS — Z170 Estrogen receptor positive status [ER+]: Secondary | ICD-10-CM | POA: Insufficient documentation

## 2020-03-01 DIAGNOSIS — C773 Secondary and unspecified malignant neoplasm of axilla and upper limb lymph nodes: Secondary | ICD-10-CM | POA: Diagnosis not present

## 2020-03-01 MED ORDER — SODIUM CHLORIDE 0.9% FLUSH
10.0000 mL | INTRAVENOUS | Status: DC | PRN
  Administered 2020-03-01: 10 mL via INTRAVENOUS
  Filled 2020-03-01: qty 10

## 2020-03-01 MED ORDER — HEPARIN SOD (PORK) LOCK FLUSH 100 UNIT/ML IV SOLN
INTRAVENOUS | Status: AC
  Filled 2020-03-01: qty 5

## 2020-03-01 MED ORDER — HEPARIN SOD (PORK) LOCK FLUSH 100 UNIT/ML IV SOLN
500.0000 [IU] | Freq: Once | INTRAVENOUS | Status: AC
  Administered 2020-03-01: 500 [IU] via INTRAVENOUS
  Filled 2020-03-01: qty 5

## 2020-04-13 ENCOUNTER — Other Ambulatory Visit: Payer: Self-pay | Admitting: Internal Medicine

## 2020-04-13 ENCOUNTER — Ambulatory Visit: Payer: Medicare Other | Attending: Internal Medicine

## 2020-04-13 DIAGNOSIS — Z23 Encounter for immunization: Secondary | ICD-10-CM

## 2020-04-13 NOTE — Progress Notes (Signed)
   Covid-19 Vaccination Clinic  Name:  Theresa Reid    MRN: [id number] DOB: 1941/03/19  04/13/2020  [name] was observed post Covid-19 immunization for 15 minutes without incident. She was provided with Vaccine Information Sheet and instruction to access the V-Safe system.   [name] was instructed to call 911 with any severe reactions post vaccine: Marland Kitchen Difficulty breathing  . Swelling of face and throat  . A fast heartbeat  . A bad rash all over body  . Dizziness and weakness   Immunizations Administered    Name Date Dose VIS Date Route   Pfizer COVID-19 Vaccine 04/13/2020 12:50 PM 0.3 mL 02/11/2020 Intramuscular   Manufacturer: [place]   Lot: [id number]   [place]: [id number]

## 2020-04-29 ENCOUNTER — Other Ambulatory Visit: Payer: Self-pay | Admitting: General Surgery

## 2020-04-29 DIAGNOSIS — R920 Mammographic microcalcification found on diagnostic imaging of breast: Secondary | ICD-10-CM

## 2020-05-05 ENCOUNTER — Encounter: Payer: Self-pay | Admitting: Family Medicine

## 2020-05-05 ENCOUNTER — Telehealth (INDEPENDENT_AMBULATORY_CARE_PROVIDER_SITE_OTHER): Payer: Medicare Other | Admitting: Family Medicine

## 2020-05-05 ENCOUNTER — Other Ambulatory Visit: Payer: Self-pay

## 2020-05-05 VITALS — BP 130/68 | Ht 65.0 in | Wt 181.0 lb

## 2020-05-05 DIAGNOSIS — C50911 Malignant neoplasm of unspecified site of right female breast: Secondary | ICD-10-CM

## 2020-05-05 DIAGNOSIS — Z170 Estrogen receptor positive status [ER+]: Secondary | ICD-10-CM | POA: Diagnosis not present

## 2020-05-05 DIAGNOSIS — E785 Hyperlipidemia, unspecified: Secondary | ICD-10-CM | POA: Diagnosis not present

## 2020-05-05 DIAGNOSIS — I10 Essential (primary) hypertension: Secondary | ICD-10-CM

## 2020-05-05 NOTE — Progress Notes (Signed)
Virtual Visit via telephone Note  This visit type was conducted due to national recommendations for restrictions regarding the COVID-19 pandemic (e.g. social distancing).  This format is felt to be most appropriate for this patient at this time.  All issues noted in this document were discussed and addressed.  No physical exam was performed (except for noted visual exam findings with Video Visits).   I connected with Stephania Fragmin today at 10:30 AM EST by telephone and verified that I am speaking with the correct person using two identifiers. Location patient: home Location provider: work  Persons participating in the virtual visit: patient, provider  I discussed the limitations, risks, security and privacy concerns of performing an evaluation and management service by telephone and the availability of in person appointments. I also discussed with the patient that there may be a patient responsible charge related to this service. The patient expressed understanding and agreed to proceed.  Interactive audio and video telecommunications were attempted between this provider and patient, however failed, due to patient having technical difficulties OR patient did not have access to video capability.  We continued and completed visit with audio only.   Reason for visit: f/u  HPI: HYPERTENSION  Disease Monitoring  Home BP Monitoring not checking though was 130/68 at the dentists office Chest pain- no    Dyspnea- no Medications  Compliance-  Taking amlodipine  Edema- no change to chronic left ankle edema  HYPERLIPIDEMIA Symptoms Chest pain on exertion:  no    Medications: Compliance- taking lipitor Right upper quadrant pain- no  Muscle aches- no  Neuropathy: Patient is almost gone from her hands.  Has progressively been improving.  History of breast cancer: Patient has follow-up scheduled with oncology, radiation oncology, and general surgery.  She has mammogram scheduled for next month.   She continues to see occupational therapy periodically for her lymphedema.   ROS: See pertinent positives and negatives per HPI.  Past Medical History:  Diagnosis Date  . Arthritis   . Breast cancer ([place])   . Diverticulitis   . Family history of breast cancer   . GERD (gastroesophageal reflux disease)   . Hyperlipidemia   . Hypertension   . Personal history of chemotherapy   . Personal history of radiation therapy     Past Surgical History:  Procedure Laterality Date  . BREAST BIOPSY Right 05/28/2018   [place] bx, INVASIVE MAMMARY CARCINOMA WITH LOBULAR FEATURES  . BREAST LUMPECTOMY Right 06/12/2018   [facility], lobular features  . BREAST LUMPECTOMY WITH SENTINEL LYMPH NODE BIOPSY Right 06/12/2018   Procedure: RIGHT BREAST WIDE EXCISION WITH SENTINEL LYMPH NODE BX;  Surgeon: Robert Bellow, MD;  Location: [facility] ORS;  Service: General;  Laterality: Right;  . CHOLECYSTECTOMY    . [place]  . INCISION AND DRAINAGE OF WOUND Right 10/30/2018   Procedure: IRRIGATION AND DEBRIDEMENT RIGHT CHEST WALL WOUND;  Surgeon: Robert Bellow, MD;  Location: [facility] ORS;  Service: General;  Laterality: Right;  . JOINT REPLACEMENT    . MASTECTOMY Right   . MASTECTOMY WITH AXILLARY LYMPH NODE DISSECTION Right 09/20/2018   Procedure: MASTECTOMY WITH AXILLARY LYMPH NODE DISSECTION RIGHT;  Surgeon: Robert Bellow, MD;  Location: [facility] ORS;  Service: General;  Laterality: Right;  . ovaraian cyst removal Right    Cyst only (NOT OVARY)  . PORTACATH PLACEMENT Left 09/20/2018   Procedure: INSERTION PORT-A-CATH, LEFT;  Surgeon: Robert Bellow, MD;  Location: [facility] ORS;  Service: General;  Laterality:  Left;  . TOTAL KNEE ARTHROPLASTY Bilateral 05/05/2010    Family History  Problem Relation Age of Onset  . Alcohol abuse Mother        deceased 80  . Diabetes Father   . Breast cancer Maternal Aunt        dx 25s; deceased 79s  . Breast cancer Maternal Aunt        dx 74s; deceased 26s     SOCIAL HX: Non-smoker   Current Outpatient Medications:  .  amLODipine (NORVASC) 5 MG tablet, TAKE 1 TABLET BY MOUTH  DAILY, Disp: 90 tablet, Rfl: 3 .  anastrozole (ARIMIDEX) 1 MG tablet, TAKE 1 TABLET BY MOUTH  DAILY DO NOT START UNTIL  COMPLETED RADIATION IN JAN  2021, Disp: 90 tablet, Rfl: 3 .  atorvastatin (LIPITOR) 80 MG tablet, TAKE 1 TABLET BY MOUTH  DAILY, Disp: 90 tablet, Rfl: 3 .  CALCIUM-VITAMIN D PO, Take 1 tablet by mouth daily. , Disp: , Rfl:  .  CRANBERRY PO, Take 1 tablet by mouth 2 (two) times daily. , Disp: , Rfl:  .  Multiple Vitamins tablet, Take 1 tablet by mouth daily. , Disp: , Rfl:  .  Omega-3 Fatty Acids (FISH OIL PO), Take 1 capsule by mouth daily., Disp: , Rfl:  .  oxybutynin (DITROPAN) 5 MG tablet, TAKE 1 TABLET BY MOUTH  TWICE DAILY, Disp: 180 tablet, Rfl: 3 .  predniSONE (DELTASONE) 5 MG tablet, prednisone 5 mg tablet  TAKE 1 TO 2 TABLETS BY MOUTH EVERY DAY AS NEEDED FOR ARTHRITIS FLARE, Disp: , Rfl:  .  Turmeric 500 MG CAPS, Take 1,000 mg by mouth 2 (two) times daily. , Disp: , Rfl:   EXAM: This is a telephone visit and thus no physical exam was completed.  ASSESSMENT AND PLAN:  Discussed the following assessment and plan:  Problem List Items Addressed This Visit    HLD (hyperlipidemia) - Primary    Previously well controlled.  Due for lipid panel.  We will see if we can arrange for this at the cancer center.  She will continue Lipitor 80 mg daily.      Relevant Orders   Lipid panel   Hypertension    Adequately controlled.  She will continue amlodipine 5 mg daily.      Malignant neoplasm of right female breast [facility])    She will continue to see her specialists.  Neuropathy has been improving.      Relevant Medications   predniSONE (DELTASONE) 5 MG tablet       I discussed the assessment and treatment plan with the patient. The patient was provided an opportunity to ask questions and all were answered. The patient agreed with the plan and  demonstrated an understanding of the instructions.   The patient was advised to call back or seek an in-person evaluation if the symptoms worsen or if the condition fails to improve as anticipated.  I provided 8 minutes of non-face-to-face time during this encounter.   Tommi Rumps, MD

## 2020-05-05 NOTE — Assessment & Plan Note (Signed)
Adequately controlled.  She will continue amlodipine 5 mg daily. 

## 2020-05-05 NOTE — Assessment & Plan Note (Signed)
Previously well controlled.  Due for lipid panel.  We will see if we can arrange for this at the cancer center.  She will continue Lipitor 80 mg daily.

## 2020-05-05 NOTE — Assessment & Plan Note (Signed)
She will continue to see her specialists.  Neuropathy has been improving.

## 2020-05-07 ENCOUNTER — Ambulatory Visit: Payer: Medicare Other

## 2020-05-14 ENCOUNTER — Ambulatory Visit: Payer: Medicare Other

## 2020-05-19 ENCOUNTER — Ambulatory Visit (INDEPENDENT_AMBULATORY_CARE_PROVIDER_SITE_OTHER): Payer: Medicare Other

## 2020-05-19 VITALS — Ht 65.0 in | Wt 181.0 lb

## 2020-05-19 DIAGNOSIS — Z0000 Encounter for general adult medical examination without abnormal findings: Secondary | ICD-10-CM | POA: Diagnosis not present

## 2020-05-19 NOTE — Progress Notes (Signed)
Subjective:   Theresa Reid is a 80 y.o. female who presents for Medicare Annual (Subsequent) preventive examination.  Review of Systems    No ROS.  Medicare Wellness Virtual Visit.         Objective:    Today's Vitals   05/19/20 1140  Weight: 181 lb (82.1 kg)  Height: 5\' 5"  (1.651 m)   Body mass index is 30.12 kg/m.  Advanced Directives 05/19/2020 11/05/2019 08/26/2019 06/09/2019 06/02/2019 05/07/2019 04/21/2019  Does Patient Have a Medical Advance Directive? Yes Yes Yes Yes Yes Yes Yes  Type of Paramedic of [place];Living will - [place];Living will [place];Living will [place];Living will [place];Living will -  Does patient want to make changes to medical advance directive? No - Patient declined No - Patient declined - No - Patient declined No - Patient declined No - Patient declined No - Patient declined  Copy of [place] in Chart? Yes - validated most recent copy scanned in chart (See row information) No - copy requested - No - copy requested No - copy requested No - copy requested -  Would patient like information on creating a medical advance directive? - No - Patient declined - No - Patient declined No - Patient declined - -    Current Medications (verified) Outpatient Encounter Medications as of 05/19/2020  Medication Sig  . amLODipine (NORVASC) 5 MG tablet TAKE 1 TABLET BY MOUTH  DAILY  . anastrozole (ARIMIDEX) 1 MG tablet TAKE 1 TABLET BY MOUTH  DAILY DO NOT START UNTIL  COMPLETED RADIATION IN JAN  2021  . atorvastatin (LIPITOR) 80 MG tablet TAKE 1 TABLET BY MOUTH  DAILY  . CALCIUM-VITAMIN D PO Take 1 tablet by mouth daily.   Marland Kitchen CRANBERRY PO Take 1 tablet by mouth 2 (two) times daily.   . Multiple Vitamins tablet Take 1 tablet by mouth daily.   . Omega-3 Fatty Acids (FISH OIL PO) Take 1 capsule by mouth daily.  Marland Kitchen oxybutynin (DITROPAN) 5 MG tablet  TAKE 1 TABLET BY MOUTH  TWICE DAILY  . predniSONE (DELTASONE) 5 MG tablet prednisone 5 mg tablet  TAKE 1 TO 2 TABLETS BY MOUTH EVERY DAY AS NEEDED FOR ARTHRITIS FLARE  . Turmeric 500 MG CAPS Take 1,000 mg by mouth 2 (two) times daily.   . [DISCONTINUED] prochlorperazine (COMPAZINE) 10 MG tablet Take 1 tablet (10 mg total) by mouth every 6 (six) hours as needed (Nausea or vomiting).   No facility-administered encounter medications on file as of 05/19/2020.    Allergies (verified) Patient has no known allergies.   History: Past Medical History:  Diagnosis Date  . Arthritis   . Breast cancer ([place])   . Diverticulitis   . Family history of breast cancer   . GERD (gastroesophageal reflux disease)   . Hyperlipidemia   . Hypertension   . Personal history of chemotherapy   . Personal history of radiation therapy    Past Surgical History:  Procedure Laterality Date  . BREAST BIOPSY Right 05/28/2018   [place] bx, INVASIVE MAMMARY CARCINOMA WITH LOBULAR FEATURES  . BREAST LUMPECTOMY Right 06/12/2018   [facility], lobular features  . BREAST LUMPECTOMY WITH SENTINEL LYMPH NODE BIOPSY Right 06/12/2018   Procedure: RIGHT BREAST WIDE EXCISION WITH SENTINEL LYMPH NODE BX;  Surgeon: Robert Bellow, MD;  Location: [facility] ORS;  Service: General;  Laterality: Right;  . CHOLECYSTECTOMY    . GALLBLADDER SURGERY  [place] Right 10/30/2018   Procedure: IRRIGATION AND DEBRIDEMENT RIGHT CHEST WALL WOUND;  Surgeon: Robert Bellow, MD;  Location: [facility] ORS;  Service: General;  Laterality: Right;  . JOINT REPLACEMENT    . MASTECTOMY Right   . MASTECTOMY WITH AXILLARY LYMPH NODE DISSECTION Right 09/20/2018   Procedure: MASTECTOMY WITH AXILLARY LYMPH NODE DISSECTION RIGHT;  Surgeon: Robert Bellow, MD;  Location: [facility] ORS;  Service: General;  Laterality: Right;  . ovaraian cyst removal Right    Cyst only (NOT OVARY)  . PORTACATH PLACEMENT Left 09/20/2018   Procedure: INSERTION  PORT-A-CATH, LEFT;  Surgeon: Robert Bellow, MD;  Location: [facility] ORS;  Service: General;  Laterality: Left;  . TOTAL KNEE ARTHROPLASTY Bilateral 05/05/2010   Family History  Problem Relation Age of Onset  . Alcohol abuse Mother        deceased 64  . Diabetes Father   . Breast cancer Maternal Aunt        dx 8s; deceased 75s  . Breast cancer Maternal Aunt        dx 47s; deceased 90s   Social History   Socioeconomic History  . Marital status: Single    Spouse name: Not on file  . Number of children: Not on file  . Years of education: Not on file  . Highest education level: Not on file  Occupational History  . Occupation: retired  Tobacco Use  . Smoking status: Never Smoker  . Smokeless tobacco: Never Used  Vaping Use  . Vaping Use: Never used  Substance and Sexual Activity  . Alcohol use: No    Alcohol/week: 0.0 standard drinks  . Drug use: No  . Sexual activity: Never  Other Topics Concern  . Not on file  Social History Narrative   Retired    Lives by herself    Pets: None   Caffeine- Coffee 2 cups daily, no tea/soda      Social Determinants of Health   Financial Resource Strain: Low Risk   . Difficulty of Paying Living Expenses: Not hard at all  Food Insecurity: No Food Insecurity  . Worried About Charity fundraiser in the Last Year: Never true  . Ran Out of Food in the Last Year: Never true  Transportation Needs: No Transportation Needs  . Lack of Transportation (Medical): No  . Lack of Transportation (Non-Medical): No  Physical Activity: Not on file  Stress: No Stress Concern Present  . Feeling of Stress : Not at all  Social Connections: Not on file    Tobacco Counseling Counseling given: Not Answered   Clinical Intake:  Pre-visit preparation completed: Yes        Diabetes: No  How often do you need to have someone help you when you read instructions, pamphlets, or other written materials from your doctor or pharmacy?: 1 -  Never   Interpreter Needed?: No      Activities of Daily Living No flowsheet data found.  Patient Care Team: Leone Haven, MD as PCP - General (Family Medicine) Sindy Guadeloupe, MD as Consulting Physician (Oncology) Rico Junker, RN as Registered Nurse Bary Castilla, Forest Gleason, MD as Consulting Physician (General Surgery) Noreene Filbert, MD as Referring Physician (Radiation Oncology)  Indicate any recent Medical Services you may have received from other than [facility] providers in the past year (date may be approximate).     Assessment:   This is a routine wellness examination for Mayce.  I connected with Ashae today by telephone and verified that I am speaking with the correct person using two identifiers. Location patient: home Location provider: work Persons participating in the virtual visit: patient, Marine scientist.    I discussed the limitations, risks, security and privacy concerns of performing an evaluation and management service by telephone and the availability of in person appointments. The patient expressed understanding and verbally consented to this telephonic visit.    Interactive audio and video telecommunications were attempted between this provider and patient, however failed, due to patient having technical difficulties OR patient did not have access to video capability.  We continued and completed visit with audio only.  Some vital signs may be absent or patient reported.   Hearing/Vision screen  Hearing Screening   125Hz  250Hz  500Hz  1000Hz  2000Hz  3000Hz  4000Hz  6000Hz  8000Hz   Right ear:           Left ear:           Comments: Patient is able to hear conversational tones without difficulty. No issues reported.  Vision Screening Comments: Followed by [facility] Wears corrective lenses Visual acuity not assessed, virtual visit.  They have seen their ophthalmologist in the last 12 months.     Dietary issues and exercise activities discussed:     Healthy diet Good water intake  Goals      Patient Stated   .  Maintain Healthy Lifestyle (pt-stated)      Depression Screen PHQ 2/9 Scores 05/19/2020 05/05/2020 11/03/2019 05/07/2019 05/02/2018 04/25/2017 04/10/2017  PHQ - 2 Score 0 0 0 0 0 0 0    Fall Risk Fall Risk  05/05/2020 11/03/2019 05/07/2019 11/05/2018 10/29/2018  Falls in the past year? 0 0 0 0 0  Number falls in past yr: 0 0 - - -  Injury with Fall? - - - - -  Follow up Falls evaluation completed Falls evaluation completed Falls evaluation completed;Falls prevention discussed - -    FALL RISK PREVENTION PERTAINING TO THE HOME: Handrails in use when climbing stairs? Yes Home free of loose throw rugs in walkways, pet beds, electrical cords, etc? Yes  Adequate lighting in your home to reduce risk of falls? Yes   ASSISTIVE DEVICES UTILIZED TO PREVENT FALLS: Life alert? No  Use of a cane, walker or w/c? Yes  Grab bars in the bathroom? Yes  Shower chair or bench in shower? No  Elevated toilet seat or a handicapped toilet? Yes   TIMED UP AND GO: Was the test performed? No . Virtual visit.   Cognitive Function: Patient is alert and oriented x3.  Denies difficulty focusing, making decisions, memory loss.  Enjoys crotchet, reading and crossword puzzles.  MMSE/6CIT deferred. Normal by direct communication/ observation.  MMSE - Mini Mental State Exam 05/02/2018 04/25/2017 02/11/2016  Orientation to time 5 5 5   Orientation to Place 5 5 5   Registration 3 3 3   Attention/ Calculation 5 5 5   Recall 3 3 3   Language- name 2 objects 2 2 2   Language- repeat 1 1 1   Language- follow 3 step command 3 3 3   Language- read & follow direction 1 1 1   Write a sentence 1 1 1   Copy design 1 1 1   Total score 30 30 30      6CIT Screen 05/07/2019  What Year? 0 points  What month? 0 points  What time? 0 points  Count back from 20 0 points  Months in reverse 0 points  Repeat phrase 0 points  Total Score 0    Immunizations Immunization History   Administered Date(s) Administered  . Fluad Quad(high Dose 65+) 01/06/2019  . Influenza Split 12/28/2010  . Influenza, High Dose Seasonal PF 01/22/2018  . Influenza, Seasonal, Injecte, Preservative Fre 01/23/2012  . Influenza,inj,Quad PF,6+ Mos 02/06/2013, 02/03/2014  . Influenza,inj,quad, With Preservative 01/22/2017  . Influenza-Unspecified 12/15/2014, 01/12/2016, 12/30/2016, 02/19/2020  . PFIZER(Purple Top)SARS-COV-2 Vaccination 05/23/2019, 06/13/2019, 04/13/2020  . Pneumococcal Conjugate-13 02/11/2016, 03/01/2017  . Pneumococcal Polysaccharide-23 01/04/2010, 11/08/2012  . Pneumococcal-Unspecified 01/22/2017  . Tdap 01/22/2017  . Zoster 09/22/2008  . Zoster Recombinat (Shingrix) 03/01/2017   Health Maintenance  Health Maintenance  Topic Date Due  . COVID-19 Vaccine (4 - Booster for Pfizer series) 10/12/2020  . TETANUS/TDAP  01/23/2027  . INFLUENZA VACCINE  Completed  . DEXA SCAN  Completed  . PNA vac Low Risk Adult  Completed    Colorectal cancer screening: No longer required.   Mammogram status: Completed 12/23/19. Repeat every year. Next scheduled 06/21/20.  Bone density- 02/27/19. Osteoarthritis.   Lung Cancer Screening: (Low Dose CT Chest recommended if Age 4-80 years, 30 pack-year currently smoking OR have quit w/in 15years.) does not qualify.   Hepatitis C Screening: does not qualify.  Vision Screening: Recommended annual ophthalmology exams for early detection of glaucoma and other disorders of the eye. Is the patient up to date with their annual eye exam?  Yes   Dental Screening: Recommended annual dental exams for proper oral hygiene.  Community Resource Referral / Chronic Care Management: CRR required this visit?  No   CCM required this visit?  No      Plan:   Keep all routine maintenance appointments.   I have personally reviewed and noted the following in the patient's chart:   . Medical and social history . Use of alcohol, tobacco or illicit drugs   . Current medications and supplements . Functional ability and status . Nutritional status . Physical activity . Advanced directives . List of other physicians . Hospitalizations, surgeries, and ER visits in previous 12 months . Vitals . Screenings to include cognitive, depression, and falls . Referrals and appointments  In addition, I have reviewed and discussed with patient certain preventive protocols, quality metrics, and best practice recommendations. A written personalized care plan for preventive services as well as general preventive health recommendations were provided to patient via mychart.     Varney Biles, LPN   2/99/3716

## 2020-05-19 NOTE — Patient Instructions (Addendum)
[name] , Thank you for taking time to come for your Medicare Wellness Visit. I appreciate your ongoing commitment to your health goals. Please review the following plan we discussed and let me know if I can assist you in the future.   These are the goals we discussed: Goals      Patient Stated   .  Maintain Healthy Lifestyle (pt-stated)       This is a list of the screening recommended for you and due dates:  Health Maintenance  Topic Date Due  . COVID-19 Vaccine (4 - Booster for Pfizer series) 10/12/2020  . Tetanus Vaccine  01/23/2027  . Flu Shot  Completed  . DEXA scan (bone density measurement)  Completed  . Pneumonia vaccines  Completed    Immunizations Immunization History  Administered Date(s) Administered  . Fluad Quad(high Dose 65+) 01/06/2019  . Influenza Split 12/28/2010  . Influenza, High Dose Seasonal PF 01/22/2018  . Influenza, Seasonal, Injecte, Preservative Fre 01/23/2012  . Influenza,inj,Quad PF,6+ Mos 02/06/2013, 02/03/2014  . Influenza,inj,quad, With Preservative 01/22/2017  . Influenza-Unspecified 12/15/2014, 01/12/2016, 12/30/2016, 02/19/2020  . PFIZER(Purple Top)SARS-COV-2 Vaccination 05/23/2019, 06/13/2019, 04/13/2020  . Pneumococcal Conjugate-13 02/11/2016, 03/01/2017  . Pneumococcal Polysaccharide-23 01/04/2010, 11/08/2012  . Pneumococcal-Unspecified 01/22/2017  . Tdap 01/22/2017  . Zoster 09/22/2008  . Zoster Recombinat (Shingrix) 03/01/2017   Advanced directives: on file  Conditions/risks identified: none new.  Follow up in one year for your annual wellness visit.   Preventive Care 29 Years and Older, Female Preventive care refers to lifestyle choices and visits with your health care provider that can promote health and wellness. What does preventive care include?  A yearly physical exam. This is also called an annual well check.  Dental exams once or twice a year.  Routine eye exams. Ask your health care provider how often you  should have your eyes checked.  Personal lifestyle choices, including:  Daily care of your teeth and gums.  Regular physical activity.  Eating a healthy diet.  Avoiding tobacco and drug use.  Limiting alcohol use.  Practicing safe sex.  Taking low-dose aspirin every day.  Taking vitamin and mineral supplements as recommended by your health care provider. What happens during an annual well check? The services and screenings done by your health care provider during your annual well check will depend on your age, overall health, lifestyle risk factors, and family history of disease. Counseling  Your health care provider may ask you questions about your:  Alcohol use.  Tobacco use.  Drug use.  Emotional well-being.  Home and relationship well-being.  Sexual activity.  Eating habits.  History of falls.  Memory and ability to understand (cognition).  Work and work Statistician.  Reproductive health. Screening  You may have the following tests or measurements:  Height, weight, and BMI.  Blood pressure.  Lipid and cholesterol levels. These may be checked every 5 years, or more frequently if you are over 22 years old.  Skin check.  Lung cancer screening. You may have this screening every year starting at age 4 if you have a 30-pack-year history of smoking and currently smoke or have quit within the past 15 years.  Fecal occult blood test (FOBT) of the stool. You may have this test every year starting at age 42.  Flexible sigmoidoscopy or colonoscopy. You may have a sigmoidoscopy every 5 years or a colonoscopy every 10 years starting at age 77.  Hepatitis C blood test.  Hepatitis B blood test.  Sexually transmitted  disease (STD) testing.  Diabetes screening. This is done by checking your blood sugar (glucose) after you have not eaten for a while (fasting). You may have this done every 1-3 years.  Bone density scan. This is done to screen for osteoporosis.  You may have this done starting at age 85.  Mammogram. This may be done every 1-2 years. Talk to your health care provider about how often you should have regular mammograms. Talk with your health care provider about your test results, treatment options, and if necessary, the need for more tests. Vaccines  Your health care provider may recommend certain vaccines, such as:  Influenza vaccine. This is recommended every year.  Tetanus, diphtheria, and acellular pertussis (Tdap, Td) vaccine. You may need a Td booster every 10 years.  Zoster vaccine. You may need this after age 77.  Pneumococcal 13-valent conjugate (PCV13) vaccine. One dose is recommended after age 48.  Pneumococcal polysaccharide (PPSV23) vaccine. One dose is recommended after age 63. Talk to your health care provider about which screenings and vaccines you need and how often you need them. This information is not intended to replace advice given to you by your health care provider. Make sure you discuss any questions you have with your health care provider. Document Released: 05/07/2015 Document Revised: 12/29/2015 Document Reviewed: 02/09/2015 Elsevier Interactive Patient Education  2017 [employer] Prevention in the Home Falls can cause injuries. They can happen to people of all ages. There are many things you can do to make your home safe and to help prevent falls. What can I do on the outside of my home?  Regularly fix the edges of walkways and driveways and fix any cracks.  Remove anything that might make you trip as you walk through a door, such as a raised step or threshold.  Trim any bushes or trees on the path to your home.  Use bright outdoor lighting.  Clear any walking paths of anything that might make someone trip, such as rocks or tools.  Regularly check to see if handrails are loose or broken. Make sure that both sides of any steps have handrails.  Any raised decks and porches should have  guardrails on the edges.  Have any leaves, snow, or ice cleared regularly.  Use sand or salt on walking paths during winter.  Clean up any spills in your garage right away. This includes oil or grease spills. What can I do in the bathroom?  Use night lights.  Install grab bars by the toilet and in the tub and shower. Do not use towel bars as grab bars.  Use non-skid mats or decals in the tub or shower.  If you need to sit down in the shower, use a plastic, non-slip stool.  Keep the floor dry. Clean up any water that spills on the floor as soon as it happens.  Remove soap buildup in the tub or shower regularly.  Attach bath mats securely with double-sided non-slip rug tape.  Do not have throw rugs and other things on the floor that can make you trip. What can I do in the bedroom?  Use night lights.  Make sure that you have a light by your bed that is easy to reach.  Do not use any sheets or blankets that are too big for your bed. They should not hang down onto the floor.  Have a firm chair that has side arms. You can use this for support while you get dressed.  Do not have throw rugs and other things on the floor that can make you trip. What can I do in the kitchen?  Clean up any spills right away.  Avoid walking on wet floors.  Keep items that you use a lot in easy-to-reach places.  If you need to reach something above you, use a strong step stool that has a grab bar.  Keep electrical cords out of the way.  Do not use floor polish or wax that makes floors slippery. If you must use wax, use non-skid floor wax.  Do not have throw rugs and other things on the floor that can make you trip. What can I do with my stairs?  Do not leave any items on the stairs.  Make sure that there are handrails on both sides of the stairs and use them. Fix handrails that are broken or loose. Make sure that handrails are as long as the stairways.  Check any carpeting to make sure that  it is firmly attached to the stairs. Fix any carpet that is loose or worn.  Avoid having throw rugs at the top or bottom of the stairs. If you do have throw rugs, attach them to the floor with carpet tape.  Make sure that you have a light switch at the top of the stairs and the bottom of the stairs. If you do not have them, ask someone to add them for you. What else can I do to help prevent falls?  Wear shoes that:  Do not have high heels.  Have rubber bottoms.  Are comfortable and fit you well.  Are closed at the toe. Do not wear sandals.  If you use a stepladder:  Make sure that it is fully opened. Do not climb a closed stepladder.  Make sure that both sides of the stepladder are locked into place.  Ask someone to hold it for you, if possible.  Clearly mark and make sure that you can see:  Any grab bars or handrails.  First and last steps.  Where the edge of each step is.  Use tools that help you move around (mobility aids) if they are needed. These include:  Canes.  Walkers.  Scooters.  Crutches.  Turn on the lights when you go into a dark area. Replace any light bulbs as soon as they burn out.  Set up your furniture so you have a clear path. Avoid moving your furniture around.  If any of your floors are uneven, fix them.  If there are any pets around you, be aware of where they are.  Review your medicines with your doctor. Some medicines can make you feel dizzy. This can increase your chance of falling. Ask your doctor what other things that you can do to help prevent falls. This information is not intended to replace advice given to you by your health care provider. Make sure you discuss any questions you have with your health care provider. Document Released: 02/04/2009 Document Revised: 09/16/2015 Document Reviewed: 05/15/2014 Elsevier Interactive Patient Education  2017 [employer].

## 2020-05-31 ENCOUNTER — Ambulatory Visit: Payer: Medicare Other | Attending: Oncology | Admitting: Occupational Therapy

## 2020-05-31 ENCOUNTER — Other Ambulatory Visit: Payer: Self-pay

## 2020-05-31 DIAGNOSIS — I972 Postmastectomy lymphedema syndrome: Secondary | ICD-10-CM | POA: Insufficient documentation

## 2020-05-31 DIAGNOSIS — L905 Scar conditions and fibrosis of skin: Secondary | ICD-10-CM | POA: Diagnosis not present

## 2020-05-31 NOTE — Therapy (Signed)
[facility] [address] [place], [address] Phone: [phone]   Fax:  [phone]  Occupational Therapy Reeval  Patient Details  Name: Theresa Reid MRN: [id number] Date of Birth: 19-Aug-1940 No data recorded  Encounter Date: 05/31/2020   OT End of Session - 05/31/20 1654    Visit Number 12    Number of Visits 14    Date for OT Re-Evaluation 07/12/20    OT Start Time 1300    OT Stop Time 1335    OT Time Calculation (min) 35 min    Activity Tolerance Patient tolerated treatment well    Behavior During Therapy [facility] for tasks assessed/performed           Past Medical History:  Diagnosis Date  . Arthritis   . Breast cancer ([place])   . Diverticulitis   . Family history of breast cancer   . GERD (gastroesophageal reflux disease)   . Hyperlipidemia   . Hypertension   . Personal history of chemotherapy   . Personal history of radiation therapy     Past Surgical History:  Procedure Laterality Date  . BREAST BIOPSY Right 05/28/2018   [place] bx, INVASIVE MAMMARY CARCINOMA WITH LOBULAR FEATURES  . BREAST LUMPECTOMY Right 06/12/2018   [facility], lobular features  . BREAST LUMPECTOMY WITH SENTINEL LYMPH NODE BIOPSY Right 06/12/2018   Procedure: RIGHT BREAST WIDE EXCISION WITH SENTINEL LYMPH NODE BX;  Surgeon: Robert Bellow, MD;  Location: [facility] ORS;  Service: General;  Laterality: Right;  . CHOLECYSTECTOMY    . [place]  . INCISION AND DRAINAGE OF WOUND Right 10/30/2018   Procedure: IRRIGATION AND DEBRIDEMENT RIGHT CHEST WALL WOUND;  Surgeon: Robert Bellow, MD;  Location: [facility] ORS;  Service: General;  Laterality: Right;  . JOINT REPLACEMENT    . MASTECTOMY Right   . MASTECTOMY WITH AXILLARY LYMPH NODE DISSECTION Right 09/20/2018   Procedure: MASTECTOMY WITH AXILLARY LYMPH NODE DISSECTION RIGHT;  Surgeon: Robert Bellow, MD;  Location: [facility] ORS;  Service: General;  Laterality: Right;  .  ovaraian cyst removal Right    Cyst only (NOT OVARY)  . PORTACATH PLACEMENT Left 09/20/2018   Procedure: INSERTION PORT-A-CATH, LEFT;  Surgeon: Robert Bellow, MD;  Location: [facility] ORS;  Service: General;  Laterality: Left;  . TOTAL KNEE ARTHROPLASTY Bilateral 05/05/2010    There were no vitals filed for this visit.   Subjective Assessment - 05/31/20 1651    Subjective  I am here for my check up again - I probably need new replacement compressoin sleeve - the last few months I have more swelling under my arm on the R side at night and in the morning    Pertinent History Patient had her original lumpectomy and sentinel lymph node biopsy back in February 2020 R breast   She then had an MRI which showed more areas of enhancement and was taken back to the OR for mastectomy and axillary lymph node dissection on 09/20/2018 which showed 15 of the lymph nodes were also positive for malignancy and this upstaged her to stage IIIa disease.  Plan was to offer her adjuvant chemotherapy following this.  However her mastectomy course was complicated by flap necrosis requiring debridement antibiotics and she had to be also taken to the OR on 10/30/2018.  Her wound has not completely healed yet and she still has a drain in place.  She seen  Dr. Dahlia Byes on 11/18/2018.  There is  concern for early cellulitis of her chest wall.  Dr. Dahlia Byes after his visit started  patient on Augmentin for 2 more weeks which further delays her chemotherapy. Lymphedema in R UE per pt developed after surgery on 10/30/2018 - stayed the same - not going down - denies pain - pt comes every about 5-7 months for re assessent and getting replacement for her daytime compression sleeve    Patient Stated Goals I want to get sleeve so my arm don't  get big and to  prevent infection    Currently in Pain? Yes    Pain Score 2     Pain Location Shoulder    Pain Orientation Right;Left    Pain Descriptors / Indicators Aching    Pain Type Chronic pain    Pain  Onset More than a month ago    Pain Frequency Constant    Aggravating Factors  arthrtiis               LYMPHEDEMA/ONCOLOGY QUESTIONNAIRE - 05/31/20 0001      Right Upper Extremity Lymphedema   15 cm Proximal to Olecranon Process 38 cm    10 cm Proximal to Olecranon Process 39 cm    Olecranon Process 25 cm    15 cm Proximal to Ulnar Styloid Process 23 cm    10 cm Proximal to Ulnar Styloid Process 20 cm    Just Proximal to Ulnar Styloid Process 14.4 cm    Across Hand at [employer] 18 cm    At [place] of 2nd Digit 5.8 cm    At [facility] of Thumb 5.8 cm      Left Upper Extremity Lymphedema   15 cm Proximal to Olecranon Process 40 cm    10 cm Proximal to Olecranon Process 37.8 cm    Olecranon Process 26 cm    15 cm Proximal to Ulnar Styloid Process 20.8 cm    10 cm Proximal to Ulnar Styloid Process 18 cm    Just Proximal to Ulnar Styloid Process 14.5 cm           Pt was seen last time 01/09/2020  Pt Jobst Elvarex soft sleeve and glove still fitting well - about 8  months old  - so do need new one Pt report the last few months she had more swelling under her R arm and chest in the night time and morning Admit she had some weight gain  Measurements increase - but in bilateral UE-see flow sheet But she is increase by 2.2 cm in upper arm and forearm this date- and more fibrosis in upper arm this date -   She ordered roll of tubigrip D for arm in the past  - and wearing that at night time    Discuss with pt that will need to look into flexitouch pump with thoracic piece for under arm and chest swelling - will get info to Rep  And discuss with Dr Janese Banks prior to pursuing pump  Will be in contact and after using pump for month -will replace her daytime compression sleeve               OT Education - 05/31/20 1654    Education Details findings of assessment    Person(s) Educated Patient    Methods Explanation;Demonstration;Tactile cues;Verbal cues;Handout     Comprehension Returned demonstration;Verbalized understanding            OT Short Term Goals - 06/30/19 1337      OT SHORT  TERM GOAL #1   Title Pt's R UE circumference decrease by at least 1cm -2 cm from hand to upper arm - to be fitted for appropriate compression garments    Status Achieved             OT Long Term Goals - 05/31/20 1702      OT LONG TERM GOAL #1   Title Pt R UE circumference decrease about 1 cm in upper arm and forearm to be fitted with new compression sleeve    Baseline R Upper arm and forearm increase by 2.2 cm compare to L - show increase chest , under arm and thoracic lymphedema now that she did not had in past    Time 6    Period Weeks    Status New    Target Date 07/12/20      OT LONG TERM GOAL #2   Title Pt maintain her R UE and thoracic lymphedema  circumference with correct compression garments and HEP    Baseline Custom daytime JObst elverax softr sleeve and glove , and tubigrip at night time not maintain her - thorcic lymphedema reported - increase in upper arm and forearm    Time 6    Period Weeks    Status New    Target Date 07/12/20                 Plan - 05/31/20 1654    Clinical Impression Statement Pt since diagnosis with R UE lymphedema  she did great with maintaining her lymphedema and circumference - with  custom Jobst Elvarex soft daytime compression sleeve and night time tubigrip - but this date her measurements on bilateral UE increase - admit she had some weight gain -but her mid upper arm and forearm increase more than it was compare to L UE - she report also the last few months increase lymphedema in R upper thoracic under arm at night time and morning - would recommend at this time for her a pump with thoracic, chest and under arm piece - will get clearance from Dr Janese Banks first before pursuing pump- and then when R UE decrease some in circumference will get new daytime compression sleeve    OT Occupational Profile and History  Problem Focused Assessment - Including review of records relating to presenting problem    Occupational performance deficits (Please refer to evaluation for details): ADL's;Play;Leisure;IADL's    Body Structure / Function / Physical Skills ADL;UE functional use;Scar mobility;Skin integrity;Edema    Rehab Potential Good    Clinical Decision Making Several treatment options, min-mod task modification necessary    Comorbidities Affecting Occupational Performance: May have comorbidities impacting occupational performance    Modification or Assistance to Complete Evaluation  No modification of tasks or assist necessary to complete eval    OT Frequency --   every 2-3 wks   OT Duration 6 weeks    OT Treatment/Interventions Self-care/ADL training;Manual lymph drainage;Therapeutic exercise;Patient/family education;Scar mobilization;Manual Therapy    Plan assess circumference after used pump for week    OT Home Exercise Plan see pt instruction    Consulted and Agree with Plan of Care Patient           Patient will benefit from skilled therapeutic intervention in order to improve the following deficits and impairments:   Body Structure / Function / Physical Skills: ADL,UE functional use,Scar mobility,Skin integrity,Edema       Visit Diagnosis: Postmastectomy lymphedema syndrome - Plan: Ot plan of care cert/re-cert  Scar condition and fibrosis of skin - Plan: Ot plan of care cert/re-cert    Problem List Patient Active Problem List   Diagnosis Date Noted  . Scalp lesion 11/03/2019  . Cataract 09/24/2018  . Urge incontinence of urine 09/24/2018  . Malignant neoplasm of right female breast ([place]) 06/07/2018  . Goals of care, counseling/discussion 06/07/2018  . Family history of breast cancer   . Dairy product intolerance 01/14/2018  . Seborrheic keratosis 04/10/2017  . Dysuria 02/19/2017  . Visual disturbance 10/03/2016  . GERD (gastroesophageal reflux disease) 09/14/2016  . HLD  (hyperlipidemia) 08/13/2015  . Hypertension 11/13/2014  . 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    Rosalyn Gess OTR/l,CLT 05/31/2020, 5:13 PM  [facility] [address] [place], [address] Phone: [phone]   Fax:  [phone]  Name: Theresa Reid MRN: [id number] Date of Birth: 09-Jun-1940

## 2020-06-08 ENCOUNTER — Other Ambulatory Visit: Payer: Self-pay | Admitting: Oncology

## 2020-06-11 ENCOUNTER — Inpatient Hospital Stay: Payer: Medicare Other | Attending: Oncology

## 2020-06-11 ENCOUNTER — Encounter: Payer: Self-pay | Admitting: Oncology

## 2020-06-11 ENCOUNTER — Inpatient Hospital Stay: Payer: Medicare Other

## 2020-06-11 ENCOUNTER — Inpatient Hospital Stay (HOSPITAL_BASED_OUTPATIENT_CLINIC_OR_DEPARTMENT_OTHER): Payer: Medicare Other | Admitting: Oncology

## 2020-06-11 VITALS — BP 145/75 | HR 76 | Temp 98.3°F | Wt 191.0 lb

## 2020-06-11 DIAGNOSIS — Z79811 Long term (current) use of aromatase inhibitors: Secondary | ICD-10-CM | POA: Insufficient documentation

## 2020-06-11 DIAGNOSIS — Z5181 Encounter for therapeutic drug level monitoring: Secondary | ICD-10-CM | POA: Diagnosis not present

## 2020-06-11 DIAGNOSIS — C50911 Malignant neoplasm of unspecified site of right female breast: Secondary | ICD-10-CM

## 2020-06-11 DIAGNOSIS — Z923 Personal history of irradiation: Secondary | ICD-10-CM | POA: Diagnosis not present

## 2020-06-11 DIAGNOSIS — E785 Hyperlipidemia, unspecified: Secondary | ICD-10-CM | POA: Insufficient documentation

## 2020-06-11 DIAGNOSIS — Z79899 Other long term (current) drug therapy: Secondary | ICD-10-CM | POA: Diagnosis not present

## 2020-06-11 DIAGNOSIS — Z9011 Acquired absence of right breast and nipple: Secondary | ICD-10-CM | POA: Insufficient documentation

## 2020-06-11 DIAGNOSIS — Z9221 Personal history of antineoplastic chemotherapy: Secondary | ICD-10-CM | POA: Insufficient documentation

## 2020-06-11 DIAGNOSIS — Z7983 Long term (current) use of bisphosphonates: Secondary | ICD-10-CM

## 2020-06-11 DIAGNOSIS — C773 Secondary and unspecified malignant neoplasm of axilla and upper limb lymph nodes: Secondary | ICD-10-CM | POA: Diagnosis not present

## 2020-06-11 DIAGNOSIS — I10 Essential (primary) hypertension: Secondary | ICD-10-CM | POA: Insufficient documentation

## 2020-06-11 DIAGNOSIS — Z08 Encounter for follow-up examination after completed treatment for malignant neoplasm: Secondary | ICD-10-CM | POA: Diagnosis not present

## 2020-06-11 DIAGNOSIS — K219 Gastro-esophageal reflux disease without esophagitis: Secondary | ICD-10-CM | POA: Insufficient documentation

## 2020-06-11 DIAGNOSIS — Z170 Estrogen receptor positive status [ER+]: Secondary | ICD-10-CM

## 2020-06-11 DIAGNOSIS — Z853 Personal history of malignant neoplasm of breast: Secondary | ICD-10-CM

## 2020-06-11 LAB — CBC WITH DIFFERENTIAL/PLATELET
Abs Immature Granulocytes: 0.01 10*3/uL (ref 0.00–0.07)
Basophils Absolute: 0 10*3/uL (ref 0.0–0.1)
Basophils Relative: 0 %
Eosinophils Absolute: 0.2 10*3/uL (ref 0.0–0.5)
Eosinophils Relative: 3 %
HCT: 40.6 % (ref 36.0–46.0)
Hemoglobin: 13.4 g/dL (ref 12.0–15.0)
Immature Granulocytes: 0 %
Lymphocytes Relative: 33 %
Lymphs Abs: 2.6 10*3/uL (ref 0.7–4.0)
MCH: 30.4 pg (ref 26.0–34.0)
MCHC: 33 g/dL (ref 30.0–36.0)
MCV: 92.1 fL (ref 80.0–100.0)
Monocytes Absolute: 0.7 10*3/uL (ref 0.1–1.0)
Monocytes Relative: 9 %
Neutro Abs: 4.3 10*3/uL (ref 1.7–7.7)
Neutrophils Relative %: 55 %
Platelets: 216 10*3/uL (ref 150–400)
RBC: 4.41 MIL/uL (ref 3.87–5.11)
RDW: 13.7 % (ref 11.5–15.5)
WBC: 7.8 10*3/uL (ref 4.0–10.5)
nRBC: 0 % (ref 0.0–0.2)

## 2020-06-11 LAB — COMPREHENSIVE METABOLIC PANEL
ALT: 16 U/L (ref 0–44)
AST: 20 U/L (ref 15–41)
Albumin: 3.9 g/dL (ref 3.5–5.0)
Alkaline Phosphatase: 81 U/L (ref 38–126)
Anion gap: 10 (ref 5–15)
BUN: 11 mg/dL (ref 8–23)
CO2: 24 mmol/L (ref 22–32)
Calcium: 8.5 mg/dL — ABNORMAL LOW (ref 8.9–10.3)
Chloride: 103 mmol/L (ref 98–111)
Creatinine, Ser: 0.53 mg/dL (ref 0.44–1.00)
GFR, Estimated: >60 mL/min (ref >60)
Glucose, Bld: 101 mg/dL — ABNORMAL HIGH (ref 70–99)
Potassium: 3.9 mmol/L (ref 3.5–5.1)
Sodium: 137 mmol/L (ref 135–145)
Total Bilirubin: 0.6 mg/dL (ref 0.3–1.2)
Total Protein: 7.1 g/dL (ref 6.5–8.1)

## 2020-06-11 MED ORDER — SODIUM CHLORIDE 0.9 % IV SOLN
Freq: Once | INTRAVENOUS | Status: AC
  Filled 2020-06-11: qty 250

## 2020-06-11 MED ORDER — HEPARIN SOD (PORK) LOCK FLUSH 100 UNIT/ML IV SOLN
500.0000 [IU] | Freq: Once | INTRAVENOUS | Status: AC | PRN
  Administered 2020-06-11: 500 [IU]
  Filled 2020-06-11: qty 5

## 2020-06-11 MED ORDER — ZOLEDRONIC ACID 4 MG/100ML IV SOLN
4.0000 mg | Freq: Once | INTRAVENOUS | Status: AC
  Administered 2020-06-11: 4 mg via INTRAVENOUS
  Filled 2020-06-11: qty 100

## 2020-06-11 NOTE — Progress Notes (Signed)
Per Dr. Janese Banks okay to proceed with Zometa with Calcium 8.5.

## 2020-06-11 NOTE — Progress Notes (Signed)
Patient denies new problems/concerns today.   °

## 2020-06-11 NOTE — Progress Notes (Signed)
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 Patient underwent lumpectomy and sentinel lymph node biopsy in February 2020. Pathology showed invasive lobular carcinoma with positive medial margin which was reexcised with still positive. Tumor was 24 mm, grade 2 ER PR positive and HER-2/neu negative. One sentinel lymph node that was excised was positive for macro met of 8 mm. No extranodal extension was present. MammaPrint came back as high risk with an average 10-year risk of untreated disease is 29%. Patient then underwent a bilateral MRI which showed additional suspicious non-mass enhancement along the inferior margin of the biopsy cavity extending 2 cm highly suspicious for continuous disease and another highly suspicious linear non-mass enhancement at the base of the nipple concerning for multifocal multicentric disease.  Patient underwent right mastectomy and axillary lymph node dissectionon 09/20/2018. There was no residual invasive mammary carcinoma within the breast. However 15 out of 20 lymph nodes were involved with metastatic carcinoma measuring up to 6 mm and remaining 5 lymph nodes with isolated tumor cells. Pathologic stage was [id number]  Despite having surgery in May 2020 which was a second surgery-patient has not been able to start adjuvant chemotherapy. Her mastectomy was complicated by flap necrosis requiring debridement antibiotics and she had to be taken back to the OR for the same. Given her age and postmastectomy complications and delayed wound healing-weekly Taxol x12 cycles was chosen as the regimen for her adjuvant chemotherapywhich she completed on 02/25/2019.  Patient started Arimidex in December 2020 and also received dental clearance to start Zometa. She completed adjuvant radiation treatment    Interval history-patient is doing well and denies any complaints at this time.  She is tolerating Arimidex and calcium with vitamin D well.  Denies any breast concerns.  She follows up with Luna Fuse for her right upper extremity lymphedema.  ECOG PS- 1 Pain scale- 0   Review of systems- Review of Systems  Constitutional: Negative for chills, fever, malaise/fatigue and weight loss.  HENT: Negative for congestion, ear discharge and nosebleeds.   Eyes: Negative for blurred vision.  Respiratory: Negative for cough, hemoptysis, sputum production, shortness of breath and wheezing.   Cardiovascular: Negative for chest pain, palpitations, orthopnea and claudication.  Gastrointestinal: Negative for abdominal pain, blood in stool, constipation, diarrhea, heartburn, melena, nausea and vomiting.  Genitourinary: Negative for dysuria, flank pain, frequency, hematuria and urgency.  Musculoskeletal: Negative for back pain, joint pain and myalgias.  Skin: Negative for rash.  Neurological:  Negative for dizziness, tingling, focal weakness, seizures, weakness and headaches.  Endo/Heme/Allergies: Does  not bruise/bleed easily.  Psychiatric/Behavioral: Negative for depression and suicidal ideas. The patient does not have insomnia.     Breast exam: Patient is s/p right mastectomy without reconstruction.  No evidence of chest wall recurrence.  No palpable masses in the left breast.  No palpable bilateral axillary adenopathy.  No Known Allergies   Past Medical History:  Diagnosis Date  . Arthritis   . Breast cancer ([place])   . Diverticulitis   . Family history of breast cancer   . GERD (gastroesophageal reflux disease)   . Hyperlipidemia   . Hypertension   . Personal history of chemotherapy   . Personal history of radiation therapy      Past Surgical History:  Procedure Laterality Date  . BREAST BIOPSY Right 05/28/2018   [place] bx, INVASIVE MAMMARY CARCINOMA WITH LOBULAR FEATURES  . BREAST LUMPECTOMY Right 06/12/2018   [facility], lobular features  . BREAST LUMPECTOMY WITH SENTINEL LYMPH NODE BIOPSY Right 06/12/2018   Procedure: RIGHT BREAST WIDE EXCISION WITH SENTINEL LYMPH NODE BX;  Surgeon: Robert Bellow, MD;  Location: [facility] ORS;  Service: General;  Laterality: Right;  . CHOLECYSTECTOMY    . [place]  . INCISION AND DRAINAGE OF WOUND Right 10/30/2018   Procedure: IRRIGATION AND DEBRIDEMENT RIGHT CHEST WALL WOUND;  Surgeon: Robert Bellow, MD;  Location: [facility] ORS;  Service: General;  Laterality: Right;  . JOINT REPLACEMENT    . MASTECTOMY Right   . MASTECTOMY WITH AXILLARY LYMPH NODE DISSECTION Right 09/20/2018   Procedure: MASTECTOMY WITH AXILLARY LYMPH NODE DISSECTION RIGHT;  Surgeon: Robert Bellow, MD;  Location: [facility] ORS;  Service: General;  Laterality: Right;  . ovaraian cyst removal Right    Cyst only (NOT OVARY)  . PORTACATH PLACEMENT Left 09/20/2018   Procedure: INSERTION PORT-A-CATH, LEFT;  Surgeon: Robert Bellow, MD;  Location: [facility] ORS;   Service: General;  Laterality: Left;  . TOTAL KNEE ARTHROPLASTY Bilateral 05/05/2010    Social History   Socioeconomic History  . Marital status: Single    Spouse name: Not on file  . Number of children: Not on file  . Years of education: Not on file  . Highest education level: Not on file  Occupational History  . Occupation: retired  Tobacco Use  . Smoking status: Never Smoker  . Smokeless tobacco: Never Used  Vaping Use  . Vaping Use: Never used  Substance and Sexual Activity  . Alcohol use: No    Alcohol/week: 0.0 standard drinks  . Drug use: No  . Sexual activity: Never  Other Topics Concern  . Not on file  Social History Narrative   Retired    Lives by herself    Pets: None   Caffeine- Coffee 2 cups daily, no tea/soda      Social Determinants of Health   Financial Resource Strain: Low Risk   . Difficulty of Paying Living Expenses: Not hard at all  Food Insecurity: No Food Insecurity  . Worried About Charity fundraiser in the Last Year: Never true  . Ran Out of Food in the Last Year: Never true  Transportation Needs: No Transportation Needs  . Lack of Transportation (Medical): No  . Lack of Transportation (Non-Medical): No  Physical Activity: Not on file  Stress: No Stress Concern Present  . Feeling of Stress : Not at all  Social Connections: Not on file  Intimate Partner Violence: Not At Risk  . Fear of Current or Ex-Partner: No  . Emotionally Abused:  No  . Physically Abused: No  . Sexually Abused: No    Family History  Problem Relation Age of Onset  . Alcohol abuse Mother        deceased 70  . Diabetes Father   . Breast cancer Maternal Aunt        dx 2s; deceased 25s  . Breast cancer Maternal Aunt        dx 77s; deceased 78s     Current Outpatient Medications:  .  anastrozole (ARIMIDEX) 1 MG tablet, TAKE 1 TABLET BY MOUTH  DAILY DO NOT START UNTIL  COMPLETED RADIATION IN JAN  2021, Disp: 90 tablet, Rfl: 0 .  amLODipine (NORVASC) 5 MG tablet,  TAKE 1 TABLET BY MOUTH  DAILY, Disp: 90 tablet, Rfl: 3 .  atorvastatin (LIPITOR) 80 MG tablet, TAKE 1 TABLET BY MOUTH  DAILY, Disp: 90 tablet, Rfl: 3 .  CALCIUM-VITAMIN D PO, Take 1 tablet by mouth daily. , Disp: , Rfl:  .  CRANBERRY PO, Take 1 tablet by mouth 2 (two) times daily. , Disp: , Rfl:  .  Multiple Vitamins tablet, Take 1 tablet by mouth daily. , Disp: , Rfl:  .  Omega-3 Fatty Acids (FISH OIL PO), Take 1 capsule by mouth daily., Disp: , Rfl:  .  oxybutynin (DITROPAN) 5 MG tablet, TAKE 1 TABLET BY MOUTH  TWICE DAILY, Disp: 180 tablet, Rfl: 3 .  predniSONE (DELTASONE) 5 MG tablet, prednisone 5 mg tablet  TAKE 1 TO 2 TABLETS BY MOUTH EVERY DAY AS NEEDED FOR ARTHRITIS FLARE, Disp: , Rfl:  .  Turmeric 500 MG CAPS, Take 1,000 mg by mouth 2 (two) times daily. , Disp: , Rfl:   Physical exam:  Vitals:   06/11/20 1321  BP: (!) 145/75  Pulse: 76  Temp: 98.3 F (36.8 C)  TempSrc: Tympanic  SpO2: 98%  Weight: 191 lb (86.6 kg)   Physical Exam HENT:     Head: Normocephalic and atraumatic.  Eyes:     Extraocular Movements: EOM normal.     Pupils: Pupils are equal, round, and reactive to light.  Cardiovascular:     Rate and Rhythm: Normal rate and regular rhythm.     Heart sounds: Normal heart sounds.  Pulmonary:     Effort: Pulmonary effort is normal.     Breath sounds: Normal breath sounds.  Abdominal:     General: Bowel sounds are normal.     Palpations: Abdomen is soft.  Musculoskeletal:     Cervical back: Normal range of motion.  Skin:    General: Skin is warm and dry.  Neurological:     Mental Status: She is alert and oriented to person, place, and time.      CMP Latest Ref Rng & Units 06/11/2020  Glucose 70 - 99 mg/dL 101(H)  BUN 8 - 23 mg/dL 11  Creatinine 0.44 - 1.00 mg/dL 0.53  Sodium 135 - 145 mmol/L 137  Potassium 3.5 - 5.1 mmol/L 3.9  Chloride 98 - 111 mmol/L 103  CO2 22 - 32 mmol/L 24  Calcium 8.9 - 10.3 mg/dL 8.5(L)  Total Protein 6.5 - 8.1 g/dL 7.1   Total Bilirubin 0.3 - 1.2 mg/dL 0.6  Alkaline Phos 38 - 126 U/L 81  AST 15 - 41 U/L 20  ALT 0 - 44 U/L 16   CBC Latest Ref Rng & Units 06/11/2020  WBC 4.0 - 10.5 K/uL 7.8  Hemoglobin 12.0 - 15.0 g/dL 13.4  Hematocrit 36.0 - 46.0 % 40.6  Platelets 150 - 400 K/uL 216      Assessment and plan- Patient is a 80 y.o. female  with invasive mammary carcinoma of the right breast pathological prognostic stage III ApT2pN3a cNX ER PR positive HER-2/neu negative status post right mastectomy and axillary lymph node dissection.She completed 12 cycles of weekly Taxol chemotherapy and adjuvant radiation treatment.     This is a routine follow-up visit o of breast cancer on Arimidex and to receive Zometa  Counts okay to proceed with Zometa today.  Calcium 8.5 today.  I will see her back in 6 months for her next dose of Zometa.  Clinically patient is doing well with no concerning signs and symptoms of recurrence based on today's exam.  She is scheduled for a left breast mammogram by Dr. Bary Castilla on 06/21/2020.  Patient will continue to take Arimidex with calcium and vitamin D.   Visit Diagnosis 1. Encounter for follow-up surveillance of breast cancer   2. Visit for monitoring Arimidex therapy   3. Encounter for monitoring zoledronic acid therapy      Dr. Randa Evens, MD, MPH [facility] [phone] 06/11/2020 1:48 PM

## 2020-06-21 ENCOUNTER — Ambulatory Visit
Admission: RE | Admit: 2020-06-21 | Discharge: 2020-06-21 | Disposition: A | Discharge status: Home / Self Care | Payer: Medicare Other | Source: Ambulatory Visit | Attending: General Surgery | Admitting: General Surgery

## 2020-06-21 ENCOUNTER — Other Ambulatory Visit: Payer: Self-pay

## 2020-06-21 DIAGNOSIS — R920 Mammographic microcalcification found on diagnostic imaging of breast: Secondary | ICD-10-CM

## 2020-06-21 DIAGNOSIS — R921 Mammographic calcification found on diagnostic imaging of breast: Secondary | ICD-10-CM | POA: Diagnosis not present

## 2020-06-21 DIAGNOSIS — R922 Inconclusive mammogram: Secondary | ICD-10-CM | POA: Diagnosis not present

## 2020-06-22 ENCOUNTER — Ambulatory Visit: Payer: Medicare Other | Attending: Oncology | Admitting: Occupational Therapy

## 2020-06-22 DIAGNOSIS — I972 Postmastectomy lymphedema syndrome: Secondary | ICD-10-CM | POA: Insufficient documentation

## 2020-06-22 NOTE — Therapy (Signed)
[place] [facility] [address], [place], [address] Phone: [phone]   Fax:  [phone]  Occupational Therapy Treatment  Patient Details  Name: Theresa Reid MRN: [id number] Date of Birth: [birth year] No data recorded  Encounter Date: 06/22/2020   OT End of Session - 06/22/20 1351    Visit Number 13    Number of Visits 14    Date for OT Re-Evaluation 07/12/20    OT Start Time 1328    OT Stop Time 1349    OT Time Calculation (min) 21 min    Activity Tolerance Patient tolerated treatment well    Behavior During Therapy [facility] for tasks assessed/performed           Past Medical History:  Diagnosis Date  . Arthritis   . Breast cancer ([place])   . Diverticulitis   . Family history of breast cancer   . GERD (gastroesophageal reflux disease)   . Hyperlipidemia   . Hypertension   . Personal history of chemotherapy   . Personal history of radiation therapy     Past Surgical History:  Procedure Laterality Date  . BREAST BIOPSY Right 05/28/2018   [place] bx, INVASIVE MAMMARY CARCINOMA WITH LOBULAR FEATURES  . BREAST LUMPECTOMY Right 06/12/2018   [facility], lobular features  . BREAST LUMPECTOMY WITH SENTINEL LYMPH NODE BIOPSY Right 06/12/2018   Procedure: RIGHT BREAST WIDE EXCISION WITH SENTINEL LYMPH NODE BX;  Surgeon: Robert Bellow, MD;  Location: [facility] ORS;  Service: General;  Laterality: Right;  . CHOLECYSTECTOMY    . [place]  . INCISION AND DRAINAGE OF WOUND Right 10/30/2018   Procedure: IRRIGATION AND DEBRIDEMENT RIGHT CHEST WALL WOUND;  Surgeon: Robert Bellow, MD;  Location: [facility] ORS;  Service: General;  Laterality: Right;  . JOINT REPLACEMENT    . MASTECTOMY Right   . MASTECTOMY WITH AXILLARY LYMPH NODE DISSECTION Right 09/20/2018   Procedure: MASTECTOMY WITH AXILLARY LYMPH NODE DISSECTION RIGHT;  Surgeon: Robert Bellow, MD;  Location: [facility] ORS;  Service: General;  Laterality: Right;  .  ovaraian cyst removal Right    Cyst only (NOT OVARY)  . PORTACATH PLACEMENT Left 09/20/2018   Procedure: INSERTION PORT-A-CATH, LEFT;  Surgeon: Robert Bellow, MD;  Location: [facility] ORS;  Service: General;  Laterality: Left;  . TOTAL KNEE ARTHROPLASTY Bilateral 05/05/2010    There were no vitals filed for this visit.   Subjective Assessment - 06/22/20 1350    Subjective  I wear my jovipak - it is hard to get on - but I can get it on - it helps for the swelling under my arm    Pertinent History Patient had her original lumpectomy and sentinel lymph node biopsy back in February 2020 R breast   She then had an MRI which showed more areas of enhancement and was taken back to the OR for mastectomy and axillary lymph node dissection on 09/20/2018 which showed 15 of the lymph nodes were also positive for malignancy and this upstaged her to stage IIIa disease.  Plan was to offer her adjuvant chemotherapy following this.  However her mastectomy course was complicated by flap necrosis requiring debridement antibiotics and she had to be also taken to the OR on 10/30/2018.  Her wound has not completely healed yet and she still has a drain in place.  She seen  Dr. Dahlia Byes on 11/18/2018.  There is concern for early cellulitis of her chest wall.  Dr. Dahlia Byes  after his visit started  patient on Augmentin for 2 more weeks which further delays her chemotherapy. Lymphedema in R UE per pt developed after surgery on 10/30/2018 - stayed the same - not going down - denies pain - pt comes every about 5-7 months for re assessent and getting replacement for her daytime compression sleeve    Patient Stated Goals I want to get sleeve so my arm don't  get big and to  prevent infection    Currently in Pain? No/denies               LYMPHEDEMA/ONCOLOGY QUESTIONNAIRE - 06/22/20 0001      Right Upper Extremity Lymphedema   15 cm Proximal to Olecranon Process 38 cm    10 cm Proximal to Olecranon Process 38 cm    Olecranon Process  24.5 cm    15 cm Proximal to Ulnar Styloid Process 23 cm    10 cm Proximal to Ulnar Styloid Process 20 cm      Left Upper Extremity Lymphedema   15 cm Proximal to Olecranon Process 40 cm    10 cm Proximal to Olecranon Process 38.5 cm    Olecranon Process 25 cm    15 cm Proximal to Ulnar Styloid Process 21.5 cm    10 cm Proximal to Ulnar Styloid Process 18 cm                Pt was seen last time 01/09/2020 for new sleeve replacement and then about 3 wks ago for reassessment   Pt report the last few months she had more swelling under her R arm and chest in the night time and morning Admit she had some weight gain  Measurements increase - but in bilateral UE-see flow sheet Had since 3 wks ago - wear her jovipak breast pad - with good results - decrease thoracic lymphedema and upper arm decrease some in R  And about same as L one now    She ordered roll of tubigrip D for arm in the past  - and wearing that at night time   Will get with [employer] for assistance to replace her daysleeve -custom Jobst Elvarex soft sleeve - she still has xtra glove - got 2 last time  WIll get with [employer] about measuring pt next week  Pt to cont with jovipak breast pad as needed -and old sleeve - contact me when she gets replacement custom daysleeve for me to assess fit   Will hold off at this time with pump - discussed with Dr Janese Banks - pt doing well with jovipak breast pad             OT Education - 06/22/20 1351    Education Details findings of assessment- progress and recommend new daysleeve    Person(s) Educated Patient    Methods Explanation;Demonstration;Tactile cues;Verbal cues;Handout    Comprehension Returned demonstration;Verbalized understanding            OT Short Term Goals - 06/30/19 1337      OT SHORT TERM GOAL #1   Title Pt's R UE circumference decrease by at least 1cm -2 cm from hand to upper arm - to be fitted for appropriate compression garments     Status Achieved             OT Long Term Goals - 05/31/20 1702      OT LONG TERM GOAL #1   Title Pt R UE circumference decrease about 1 cm  in upper arm and forearm to be fitted with new compression sleeve    Baseline R Upper arm and forearm increase by 2.2 cm compare to L - show increase chest , under arm and thoracic lymphedema now that she did not had in past    Time 6    Period Weeks    Status New    Target Date 07/12/20      OT LONG TERM GOAL #2   Title Pt maintain her R UE and thoracic lymphedema  circumference with correct compression garments and HEP    Baseline Custom daytime JObst elverax softr sleeve and glove , and tubigrip at night time not maintain her - thorcic lymphedema reported - increase in upper arm and forearm    Time 6    Period Weeks    Status New    Target Date 07/12/20                 Plan - 06/22/20 1351    Clinical Impression Statement Pt with diagnosis of R UE lymphedema - she done great thru the years to maintain her R UE circumference but increase this past 3 wks in both - recommend for her to use her jovipak breastpad to clear thoracic lymphedema - and she report she also gained some weight - pt due for new copmression sleeve - Jobst Elvarex soft sleeve and glove- she do have glove still that is good - will contact [employer] tomorrow and get with [employer] to measure pt for Jobst Elvarex soft sleeve    OT Occupational Profile and History Problem Focused Assessment - Including review of records relating to presenting problem    Occupational performance deficits (Please refer to evaluation for details): ADL's;Play;Leisure;IADL's    Body Structure / Function / Physical Skills ADL;UE functional use;Scar mobility;Skin integrity;Edema    Rehab Potential Good    Clinical Decision Making Several treatment options, min-mod task modification necessary    Comorbidities Affecting Occupational Performance: May have comorbidities impacting  occupational performance    Modification or Assistance to Complete Evaluation  No modification of tasks or assist necessary to complete eval    OT Frequency 3x / week    OT Duration --   3 wks   OT Treatment/Interventions Self-care/ADL training;Manual lymph drainage;Therapeutic exercise;Patient/family education;Scar mobilization;Manual Therapy    OT Home Exercise Plan see pt instruction    Consulted and Agree with Plan of Care Patient           Patient will benefit from skilled therapeutic intervention in order to improve the following deficits and impairments:   Body Structure / Function / Physical Skills: ADL,UE functional use,Scar mobility,Skin integrity,Edema       Visit Diagnosis: Postmastectomy lymphedema syndrome    Problem List Patient Active Problem List   Diagnosis Date Noted  . Scalp lesion 11/03/2019  . Cataract 09/24/2018  . Urge incontinence of urine 09/24/2018  . Malignant neoplasm of right female breast ([place]) 06/07/2018  . Goals of care, counseling/discussion 06/07/2018  . Family history of breast cancer   . Dairy product intolerance 01/14/2018  . Seborrheic keratosis 04/10/2017  . Dysuria 02/19/2017  . Visual disturbance 10/03/2016  . GERD (gastroesophageal reflux disease) 09/14/2016  . HLD (hyperlipidemia) 08/13/2015  . Hypertension 11/13/2014  . 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    Rosalyn Gess OTR/l,CLT 06/22/2020, 1:55 PM  [place] [facility] [address], [place], [address] Phone: [phone]   Fax:  [phone]  Name: Theresa Reid MRN: [id number] Date of Birth: June 25, 1940

## 2020-06-29 DIAGNOSIS — Z853 Personal history of malignant neoplasm of breast: Secondary | ICD-10-CM | POA: Diagnosis not present

## 2020-07-20 DIAGNOSIS — M159 Polyosteoarthritis, unspecified: Secondary | ICD-10-CM | POA: Diagnosis not present

## 2020-07-20 DIAGNOSIS — M353 Polymyalgia rheumatica: Secondary | ICD-10-CM | POA: Diagnosis not present

## 2020-07-23 DIAGNOSIS — Z4431 Encounter for fitting and adjustment of external right breast prosthesis: Secondary | ICD-10-CM | POA: Diagnosis not present

## 2020-07-23 DIAGNOSIS — C50111 Malignant neoplasm of central portion of right female breast: Secondary | ICD-10-CM | POA: Diagnosis not present

## 2020-08-09 ENCOUNTER — Other Ambulatory Visit: Payer: Self-pay

## 2020-08-09 ENCOUNTER — Ambulatory Visit (INDEPENDENT_AMBULATORY_CARE_PROVIDER_SITE_OTHER): Payer: Medicare Other | Admitting: Family Medicine

## 2020-08-09 DIAGNOSIS — E785 Hyperlipidemia, unspecified: Secondary | ICD-10-CM

## 2020-08-09 DIAGNOSIS — K219 Gastro-esophageal reflux disease without esophagitis: Secondary | ICD-10-CM

## 2020-08-09 DIAGNOSIS — I10 Essential (primary) hypertension: Secondary | ICD-10-CM

## 2020-08-09 DIAGNOSIS — R7309 Other abnormal glucose: Secondary | ICD-10-CM

## 2020-08-09 LAB — LIPID PANEL
Cholesterol: 176 mg/dL (ref 0–200)
HDL: 65.5 mg/dL (ref >39.00)
LDL Cholesterol: 84 mg/dL (ref 0–99)
NonHDL: 110.2
Total CHOL/HDL Ratio: 3
Triglycerides: 132 mg/dL (ref 0.0–149.0)
VLDL: 26.4 mg/dL (ref 0.0–40.0)

## 2020-08-09 LAB — HEMOGLOBIN A1C: Hgb A1c MFr Bld: 6 % (ref 4.6–6.5)

## 2020-08-09 NOTE — Assessment & Plan Note (Signed)
Check lipid panel.  Continue Lipitor 80 mg once daily.

## 2020-08-09 NOTE — Assessment & Plan Note (Signed)
Check A1c. 

## 2020-08-09 NOTE — Assessment & Plan Note (Signed)
Generally very well controlled.  She will monitor the foods that cause this.

## 2020-08-09 NOTE — Assessment & Plan Note (Signed)
Well-controlled.  She will continue amlodipine 5 mg once daily. 

## 2020-08-09 NOTE — Progress Notes (Signed)
Tommi Rumps, MD Phone: [phone]  Theresa Reid is a 80 y.o. female who presents today for f/u.  HYPERTENSION  Disease Monitoring  Home BP Monitoring not checking Chest pain- no    Dyspnea- no Medications  Compliance-  Taking amlodipine.  Edema- no  HYPERLIPIDEMIA Symptoms Chest pain on exertion:  no   Medications: Compliance- taking lipitor Right upper quadrant pain- no  Muscle aches- no  GERD: Patient notes rarely having symptoms depending on what she eats.  She is no longer on medication for this.  No dysphagia.  No blood in her stool.  History of breast cancer: She continues to see oncology, general surgery, and radiation oncology.  She notes she had a good checkup recently with her surgeon.    Social History   Tobacco Use  Smoking Status Never Smoker  Smokeless Tobacco Never Used    Current Outpatient Medications on File Prior to Visit  Medication Sig Dispense Refill  . amLODipine (NORVASC) 5 MG tablet TAKE 1 TABLET BY MOUTH  DAILY 90 tablet 3  . anastrozole (ARIMIDEX) 1 MG tablet TAKE 1 TABLET BY MOUTH  DAILY DO NOT START UNTIL  COMPLETED RADIATION IN JAN  2021 90 tablet 0  . atorvastatin (LIPITOR) 80 MG tablet TAKE 1 TABLET BY MOUTH  DAILY 90 tablet 3  . CALCIUM-VITAMIN D PO Take 1 tablet by mouth daily.     Marland Kitchen COVID-19 mRNA vaccine, Pfizer, 30 MCG/0.3ML injection USE AS DIRECTED .3 mL 0  . CRANBERRY PO Take 1 tablet by mouth 2 (two) times daily.     . Multiple Vitamins tablet Take 1 tablet by mouth daily.     . Omega-3 Fatty Acids (FISH OIL PO) Take 1 capsule by mouth daily.    Marland Kitchen oxybutynin (DITROPAN) 5 MG tablet TAKE 1 TABLET BY MOUTH  TWICE DAILY 180 tablet 3  . predniSONE (DELTASONE) 5 MG tablet prednisone 5 mg tablet  TAKE 1 TO 2 TABLETS BY MOUTH EVERY DAY AS NEEDED FOR ARTHRITIS FLARE    . Turmeric 500 MG CAPS Take 1,000 mg by mouth 2 (two) times daily.     . [DISCONTINUED] prochlorperazine (COMPAZINE) 10 MG tablet Take 1 tablet (10 mg total) by  mouth every 6 (six) hours as needed (Nausea or vomiting). 30 tablet 1   No current facility-administered medications on file prior to visit.     ROS see history of present illness  Objective  Physical Exam Vitals:   08/09/20 1315  BP: 122/72  Pulse: 86  Resp: 16  Temp: (!) 96.2 F (35.7 C)  SpO2: 98%    BP Readings from Last 3 Encounters:  08/09/20 122/72  06/11/20 (!) 145/75  05/05/20 130/68   Wt Readings from Last 3 Encounters:  08/09/20 188 lb 9.6 oz (85.5 kg)  06/11/20 191 lb (86.6 kg)  05/19/20 181 lb (82.1 kg)    Physical Exam Constitutional:      General: She is not in acute distress.    Appearance: She is not diaphoretic.  Cardiovascular:     Rate and Rhythm: Normal rate and regular rhythm.     Heart sounds: Normal heart sounds.  Pulmonary:     Effort: Pulmonary effort is normal.     Breath sounds: Normal breath sounds.  Musculoskeletal:     Right lower leg: No edema.     Left lower leg: No edema.  Skin:    General: Skin is warm and dry.  Neurological:     Mental Status: She is  alert.      Assessment/Plan: Please see individual problem list.  Problem List Items Addressed This Visit    Hypertension    Well-controlled.  She will continue amlodipine 5 mg once daily.      HLD (hyperlipidemia)    Check lipid panel.  Continue Lipitor 80 mg once daily.      Relevant Orders   Lipid panel   GERD (gastroesophageal reflux disease)    Generally very well controlled.  She will monitor the foods that cause this.      Elevated glucose    Check A1c.      Relevant Orders   HgB A1c      This visit occurred during the SARS-CoV-2 public health emergency.  Safety protocols were in place, including screening questions prior to the visit, additional usage of staff PPE, and extensive cleaning of exam room while observing appropriate contact time as indicated for disinfecting solutions.    Tommi Rumps, MD [place]

## 2020-08-09 NOTE — Patient Instructions (Signed)
Nice to see you. We will check labs today.  

## 2020-08-31 ENCOUNTER — Other Ambulatory Visit: Payer: Self-pay | Admitting: Oncology

## 2020-08-31 ENCOUNTER — Other Ambulatory Visit: Payer: Self-pay | Admitting: Family Medicine

## 2020-08-31 DIAGNOSIS — N3941 Urge incontinence: Secondary | ICD-10-CM

## 2020-08-31 DIAGNOSIS — I10 Essential (primary) hypertension: Secondary | ICD-10-CM

## 2020-09-08 ENCOUNTER — Other Ambulatory Visit: Payer: Self-pay | Admitting: Family Medicine

## 2020-09-13 ENCOUNTER — Ambulatory Visit: Payer: Medicare Other | Attending: Oncology | Admitting: Occupational Therapy

## 2020-09-13 ENCOUNTER — Other Ambulatory Visit: Payer: Self-pay

## 2020-09-13 DIAGNOSIS — I972 Postmastectomy lymphedema syndrome: Secondary | ICD-10-CM | POA: Diagnosis not present

## 2020-09-13 NOTE — Therapy (Signed)
[place] [facility] [address] [place], [address] Phone: [phone]   Fax:  [phone]  Occupational Therapy Treatment  Patient Details  Name: Theresa Reid MRN: [id number] Date of Birth: 1940/05/09 No data recorded  Encounter Date: 09/13/2020   OT End of Session - 09/13/20 1334    Visit Number 14    Number of Visits 14    Date for OT Re-Evaluation 09/13/20    OT Start Time 1302    OT Stop Time 1327    OT Time Calculation (min) 25 min    Activity Tolerance Patient tolerated treatment well    Behavior During Therapy [facility] for tasks assessed/performed           Past Medical History:  Diagnosis Date  . Arthritis   . Breast cancer ([place])   . Diverticulitis   . Family history of breast cancer   . GERD (gastroesophageal reflux disease)   . Hyperlipidemia   . Hypertension   . Personal history of chemotherapy   . Personal history of radiation therapy     Past Surgical History:  Procedure Laterality Date  . BREAST BIOPSY Right 05/28/2018   [place] bx, INVASIVE MAMMARY CARCINOMA WITH LOBULAR FEATURES  . BREAST LUMPECTOMY Right 06/12/2018   [facility], lobular features  . BREAST LUMPECTOMY WITH SENTINEL LYMPH NODE BIOPSY Right 06/12/2018   Procedure: RIGHT BREAST WIDE EXCISION WITH SENTINEL LYMPH NODE BX;  Surgeon: Robert Bellow, MD;  Location: [facility] ORS;  Service: General;  Laterality: Right;  . CHOLECYSTECTOMY    . [place]  . INCISION AND DRAINAGE OF WOUND Right 10/30/2018   Procedure: IRRIGATION AND DEBRIDEMENT RIGHT CHEST WALL WOUND;  Surgeon: Robert Bellow, MD;  Location: [facility] ORS;  Service: General;  Laterality: Right;  . JOINT REPLACEMENT    . MASTECTOMY Right   . MASTECTOMY WITH AXILLARY LYMPH NODE DISSECTION Right 09/20/2018   Procedure: MASTECTOMY WITH AXILLARY LYMPH NODE DISSECTION RIGHT;  Surgeon: Robert Bellow, MD;  Location: [facility] ORS;  Service: General;  Laterality: Right;   . ovaraian cyst removal Right    Cyst only (NOT OVARY)  . PORTACATH PLACEMENT Left 09/20/2018   Procedure: INSERTION PORT-A-CATH, LEFT;  Surgeon: Robert Bellow, MD;  Location: [facility] ORS;  Service: General;  Laterality: Left;  . TOTAL KNEE ARTHROPLASTY Bilateral 05/05/2010    There were no vitals filed for this visit.   Subjective Assessment - 09/13/20 1333    Subjective  I washed my sleeve for the last 2 wks and did like you and [employer] told me - stretch the wrist -rest of sleeve feels good- and wrist do feel better - not as tight as 2 wks ago    Pertinent History Patient had her original lumpectomy and sentinel lymph node biopsy back in February 2020 R breast   She then had an MRI which showed more areas of enhancement and was taken back to the OR for mastectomy and axillary lymph node dissection on 09/20/2018 which showed 15 of the lymph nodes were also positive for malignancy and this upstaged her to stage IIIa disease.  Plan was to offer her adjuvant chemotherapy following this.  However her mastectomy course was complicated by flap necrosis requiring debridement antibiotics and she had to be also taken to the OR on 10/30/2018.  Her wound has not completely healed yet and she still has a drain in place.  She seen  Dr. Dahlia Byes on 11/18/2018.  There  is concern for early cellulitis of her chest wall.  Dr. Dahlia Byes after his visit started  patient on Augmentin for 2 more weeks which further delays her chemotherapy. Lymphedema in R UE per pt developed after surgery on 10/30/2018 - stayed the same - not going down - denies pain - pt comes every about 5-7 months for re assessent and getting replacement for her daytime compression sleeve    Patient Stated Goals I want to get sleeve so my arm don't  get big and to  prevent infection    Currently in Pain? No/denies               LYMPHEDEMA/ONCOLOGY QUESTIONNAIRE - 09/13/20 0001      Right Upper Extremity Lymphedema   15 cm Proximal to Olecranon Process  39 cm    10 cm Proximal to Olecranon Process 39 cm    Olecranon Process 25 cm    15 cm Proximal to Ulnar Styloid Process 22.8 cm    10 cm Proximal to Ulnar Styloid Process 20 cm    Just Proximal to Ulnar Styloid Process 15 cm    Across Hand at [employer] 18 cm               Pt was seen last time 3/22  And in need for new daytime compression sleeve  Pt report the last few months she had more swelling under her R arm and chest in the night time and morning Admit she had some weight gain Measurementsincrease - but in bilateral UE-see flow sheet Reinforce again to wear her jovipak breast pad- and stitch it into one bra that she only use for that - and will ease donning and wearing  - has great results with it to  decrease thoracic lymphedema and upper arm    She ordered roll of tubigrip D for [place] the past- and wearing that at night time  She arrive with her custom Jobst Elvarex soft sleeve on  - she still has xtra glove - got 2 last time - she washed it per [employer] instructions for 2 wks and stretch wrist - report fitting better than 2 wks ago Appear to fit well - not pain or numbness with wearing her compression- pt to cont with same homeprogram and contact me again in 6-8 months for replacement   Will hold off at this time with pump - discussed with Dr Janese Banks - pt doing well with jovipak breast pad               OT Education - 09/13/20 1334    Education Details donning and wearing of compression sleeve and garments    Person(s) Educated Patient    Methods Explanation;Demonstration;Tactile cues;Verbal cues;Handout    Comprehension Returned demonstration;Verbalized understanding            OT Short Term Goals - 06/30/19 1337      OT SHORT TERM GOAL #1   Title Pt's R UE circumference decrease by at least 1cm -2 cm from hand to upper arm - to be fitted for appropriate compression garments    Status Achieved             OT Long Term Goals -  09/13/20 1338      OT LONG TERM GOAL #1   Title Pt R UE circumference decrease about 1 cm in upper arm and forearm to be fitted with new compression sleeve    Baseline fitted with new sleeve and doing well  maintaining her circumference    Status Achieved      OT LONG TERM GOAL #2   Title Pt maintain her R UE and thoracic lymphedema  circumference with correct compression garments and HEP    Baseline She is wearing jovipak breastpad as needed - fitted with new sleeve    Status Achieved                 Plan - 09/13/20 1335    Clinical Impression Statement Pt with diagnosis of R UE lymphedema - she done great thru the years to maintain her R UE circumference with Jobst Elvarex sleeve and glove and jovipak breast pad as needed- was fitted 2 wks ago at [facility] with sleeve -but was to tight at wris- she did wash and stretch it -and fitting well this date - circumference doing well -  pt to cont wearing for about 6-8 months , and use Jovipak as needed night time or day time to  clear thoracic lymphedema - contact me when due for replacement    OT Occupational Profile and History Problem Focused Assessment - Including review of records relating to presenting problem    Occupational performance deficits (Please refer to evaluation for details): ADL's;Play;Leisure;IADL's    Body Structure / Function / Physical Skills ADL;UE functional use;Scar mobility;Skin integrity;Edema    Rehab Potential Good    Clinical Decision Making Several treatment options, min-mod task modification necessary    Comorbidities Affecting Occupational Performance: May have comorbidities impacting occupational performance    Modification or Assistance to Complete Evaluation  No modification of tasks or assist necessary to complete eval    OT Treatment/Interventions Self-care/ADL training;Manual lymph drainage;Therapeutic exercise;Patient/family education;Scar mobilization;Manual Therapy    OT Home Exercise Plan see pt  instruction    Consulted and Agree with Plan of Care Patient           Patient will benefit from skilled therapeutic intervention in order to improve the following deficits and impairments:   Body Structure / Function / Physical Skills: ADL,UE functional use,Scar mobility,Skin integrity,Edema       Visit Diagnosis: Postmastectomy lymphedema syndrome - Plan: Ot plan of care cert/re-cert    Problem List Patient Active Problem List   Diagnosis Date Noted  . Elevated glucose 08/09/2020  . Scalp lesion 11/03/2019  . Cataract 09/24/2018  . Urge incontinence of urine 09/24/2018  . Malignant neoplasm of right female breast ([place]) 06/07/2018  . Goals of care, counseling/discussion 06/07/2018  . Family history of breast cancer   . Dairy product intolerance 01/14/2018  . Seborrheic keratosis 04/10/2017  . Dysuria 02/19/2017  . Visual disturbance 10/03/2016  . GERD (gastroesophageal reflux disease) 09/14/2016  . HLD (hyperlipidemia) 08/13/2015  . Hypertension 11/13/2014  . 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    Rosalyn Gess OTR/L,CLT 09/13/2020, 1:41 PM  [place] [facility] [address], [place], [address] Phone: [phone]   Fax:  [phone]  Name: Theresa Reid MRN: [id number] Date of Birth: 10-27-1940

## 2020-09-15 ENCOUNTER — Ambulatory Visit: Payer: Medicare Other | Admitting: Internal Medicine

## 2020-09-30 NOTE — Progress Notes (Signed)
Office Visit Note  Patient: Theresa Reid             Date of Birth: April 10, 1941           MRN: [id number]             PCP: Leone Haven, MD Referring: Leone Haven, MD Visit Date: 10/01/2020   Subjective:  New Patient (Initial Visit) (Patient complains of generalized joint pain, nothing severe on a normal basis only worse with flares. Patient complains of right shoulder pain especially. Patient is currently 5-10 mg daily Prednisone for flares, which seems to relieve pain successfully. )   History of Present Illness: Theresa Reid is a 80 y.o. female here for evaluation for PMR previously diagnosed 2016 and has been taking prednisone 5 mg PO daily dose and seeing [facility] rheumatology Hoyle Sauer for this now transitioning care due to leaving the practice.  Currently she experiences a chronic mild to moderate level of joint pain in multiple sites with osteoarthritis.  She experiences flareup of joint pain symptoms which she treats with prednisone 5-10 milligrams daily as needed which she estimates is maybe only a few days out of each month.  Her most problematic area is in the right shoulder.  She does have history of right breast cancer treatment of which has some residual right arm lymphedema.  Currently today she is feeling well.  Activities of Daily Living:  Patient reports morning stiffness for 1 hour.   Patient Reports nocturnal pain.  Difficulty dressing/grooming: Denies Difficulty climbing stairs: Denies Difficulty getting out of chair: Denies Difficulty using hands for taps, buttons, cutlery, and/or writing: Denies  Review of Systems  Constitutional:  Negative for fatigue.  HENT:  Negative for mouth sores, mouth dryness and nose dryness.   Eyes:  Negative for pain, itching, visual disturbance and dryness.  Respiratory:  Negative for cough, hemoptysis, shortness of breath and difficulty breathing.   Cardiovascular:  Negative for chest pain, palpitations and  swelling in legs/feet.  Gastrointestinal:  Negative for abdominal pain, blood in stool, constipation and diarrhea.  Endocrine: Negative for increased urination.  Genitourinary:  Negative for painful urination.  Musculoskeletal:  Positive for joint pain, joint pain and morning stiffness. Negative for joint swelling, myalgias, muscle weakness, muscle tenderness and myalgias.  Skin:  Negative for color change, rash and redness.  Allergic/Immunologic: Negative for susceptible to infections.  Neurological:  Positive for numbness and parasthesias. Negative for dizziness, headaches, memory loss and weakness.  Hematological:  Negative for swollen glands.  Psychiatric/Behavioral:  Positive for sleep disturbance. Negative for confusion.    PMFS History:  Patient Active Problem List   Diagnosis Date Noted   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   Elevated glucose 08/09/2020   Cataract 09/24/2018   Urge incontinence of urine 09/24/2018   Malignant neoplasm of right female breast ([place]) 06/07/2018   Goals of care, counseling/discussion 06/07/2018   Family history of breast cancer    Dairy product intolerance 01/14/2018   Seborrheic keratosis 04/10/2017   Dysuria 02/19/2017   Visual disturbance 10/03/2016   HLD (hyperlipidemia) 08/13/2015   Hypertension 11/13/2014   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    Past Medical History:  Diagnosis Date   Arthritis    Breast cancer ([place])    Diverticulitis    Family history of breast cancer    GERD (gastroesophageal reflux disease)    Hyperlipidemia    Hypertension    Personal history of chemotherapy    Personal history  of radiation therapy     Family History  Problem Relation Age of Onset   Alcohol abuse Mother        deceased 53   Arthritis Mother    Diabetes Father    Breast cancer Maternal Aunt        dx 2s; deceased 60s   Breast cancer Maternal Aunt        dx 35s; deceased 51s   Arthritis Maternal Grandmother    Past  Surgical History:  Procedure Laterality Date   BREAST BIOPSY Right 05/28/2018   [place] bx, INVASIVE MAMMARY CARCINOMA WITH LOBULAR FEATURES   BREAST LUMPECTOMY Right 06/12/2018   [place], lobular features   BREAST LUMPECTOMY WITH SENTINEL LYMPH NODE BIOPSY Right 06/12/2018   Procedure: RIGHT BREAST WIDE EXCISION WITH SENTINEL LYMPH NODE BX;  Surgeon: Robert Bellow, MD;  Location: [facility] ORS;  Service: General;  Laterality: Right;   [place] Right 10/30/2018   Procedure: IRRIGATION AND DEBRIDEMENT RIGHT CHEST WALL WOUND;  Surgeon: Robert Bellow, MD;  Location: [facility] ORS;  Service: General;  Laterality: Right;   JOINT REPLACEMENT     MASTECTOMY Right    MASTECTOMY WITH AXILLARY LYMPH NODE DISSECTION Right 09/20/2018   Procedure: MASTECTOMY WITH AXILLARY LYMPH NODE DISSECTION RIGHT;  Surgeon: Robert Bellow, MD;  Location: [facility] ORS;  Service: General;  Laterality: Right;   ovaraian cyst removal Right    Cyst only (NOT OVARY)   PORTACATH PLACEMENT Left 09/20/2018   Procedure: INSERTION PORT-A-CATH, LEFT;  Surgeon: Robert Bellow, MD;  Location: [facility] ORS;  Service: General;  Laterality: Left;   TOTAL KNEE ARTHROPLASTY Bilateral 05/05/2010   Social History   Social History Narrative   Retired    Lives by herself    Pets: None   Caffeine- Coffee 2 cups daily, no tea/soda      Immunization History  Administered Date(s) Administered   Fluad Quad(high Dose 65+) 01/06/2019   Influenza Split 12/28/2010   Influenza, High Dose Seasonal PF 01/22/2018   Influenza, Seasonal, Injecte, Preservative Fre 01/23/2012   Influenza,inj,Quad PF,6+ Mos 02/06/2013, 02/03/2014   Influenza,inj,quad, With Preservative 01/22/2017   Influenza-Unspecified 12/15/2014, 01/12/2016, 12/30/2016, 02/19/2020   PFIZER(Purple Top)SARS-COV-2 Vaccination 05/23/2019, 06/13/2019, 04/13/2020   Pneumococcal Conjugate-13 02/11/2016, 03/01/2017    Pneumococcal Polysaccharide-23 01/04/2010, 11/08/2012   Pneumococcal-Unspecified 01/22/2017   Tdap 01/22/2017   Zoster Recombinat (Shingrix) 03/01/2017   Zoster, Live 09/22/2008     Objective: Vital Signs: BP 136/77 (BP Location: Left Arm, Patient Position: Sitting, Cuff Size: Normal)   Pulse 82   Ht 5' 4.5" (1.638 m)   Wt 193 lb 12.8 oz (87.9 kg)   BMI 32.75 kg/m    Physical Exam Constitutional:      Appearance: She is obese.  HENT:     Right Ear: External ear normal.     Left Ear: External ear normal.     Mouth/Throat:     Mouth: Mucous membranes are moist.     Pharynx: Oropharynx is clear.  Eyes:     Conjunctiva/sclera: Conjunctivae normal.  Cardiovascular:     Rate and Rhythm: Normal rate and regular rhythm.  Pulmonary:     Effort: Pulmonary effort is normal.     Breath sounds: Normal breath sounds.  Skin:    General: Skin is warm and dry.     Comments: Large seborrheic keratosis on right cheek Scattered small angiomas on upper chest  Neurological:     Mental Status: She is alert.  Psychiatric:        Mood and Affect: Mood normal.     Musculoskeletal Exam:  Neck full ROM no tenderness Shoulders full ROM, no tenderness Elbows full ROM no tenderness or swelling, right arm in soft compressive sleeve Wrists full ROM no tenderness or swelling Fingers full ROM no palpable synovitis Knees full ROM, patellofemoral crepitus, no effusions Ankles full ROM no tenderness or swelling   Investigation: No additional findings.  Imaging: No results found.  Recent Labs: Lab Results  Component Value Date   WBC 7.8 06/11/2020   HGB 13.4 06/11/2020   PLT 216 06/11/2020   NA 137 06/11/2020   K 3.9 06/11/2020   CL 103 06/11/2020   CO2 24 06/11/2020   GLUCOSE 101 (H) 06/11/2020   BUN 11 06/11/2020   CREATININE 0.53 06/11/2020   BILITOT 0.6 06/11/2020   ALKPHOS 81 06/11/2020   AST 20 06/11/2020   ALT 16 06/11/2020   PROT 7.1 06/11/2020   ALBUMIN 3.9 06/11/2020    CALCIUM 8.5 (L) 06/11/2020   GFRAA >60 12/08/2019    Speciality Comments: No specialty comments available.  Procedures:  No procedures performed Allergies: Patient has no known allergies.   Assessment / Plan:     Visit Diagnoses: Primary osteoarthritis involving multiple joints - Plan: predniSONE (DELTASONE) 5 MG tablet  Generalized osteoarthritis with some additional risk factors from latest glucocorticoid use for PMR.  We will continue the current treatment plan.  Discussed she can contact if there is any acute worsening or functional problems to reassess.  History of polymyalgia rheumatica - Plan: predniSONE (DELTASONE) 5 MG tablet  History of PMR I do not currently see any decree strength and range of motion in proximal muscle groups with minimal tenderness to exam.  Overall glucocorticoid is very very DMARD at this time.  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  Orders: No orders of the defined types were placed in this encounter.  Meds ordered this encounter  Medications   predniSONE (DELTASONE) 5 MG tablet    Sig: prednisone 5 mg tablet  TAKE 1 TO 2 TABLETS BY MOUTH EVERY DAY AS NEEDED FOR ARTHRITIS FLARE    Dispense:  30 tablet    Refill:  3     Follow-Up Instructions: Return if symptoms worsen or fail to improve.   Collier Salina, MD  Note - This record has been created using [employer].  Chart creation errors have been sought, but may not always  have been located. Such creation errors do not reflect on  the standard of medical care.

## 2020-10-01 ENCOUNTER — Encounter: Payer: Self-pay | Admitting: Internal Medicine

## 2020-10-01 ENCOUNTER — Other Ambulatory Visit: Payer: Self-pay

## 2020-10-01 ENCOUNTER — Ambulatory Visit: Payer: Medicare Other | Admitting: Internal Medicine

## 2020-10-01 VITALS — BP 136/77 | HR 82 | Ht 64.5 in | Wt 193.8 lb

## 2020-10-01 DIAGNOSIS — Z170 Estrogen receptor positive status [ER+]: Secondary | ICD-10-CM

## 2020-10-01 DIAGNOSIS — C50911 Malignant neoplasm of unspecified site of right female breast: Secondary | ICD-10-CM

## 2020-10-01 DIAGNOSIS — M8949 Other hypertrophic osteoarthropathy, multiple sites: Secondary | ICD-10-CM

## 2020-10-01 DIAGNOSIS — Z8739 Personal history of other diseases of the musculoskeletal system and connective tissue: Secondary | ICD-10-CM

## 2020-10-01 DIAGNOSIS — M159 Polyosteoarthritis, unspecified: Secondary | ICD-10-CM

## 2020-10-01 MED ORDER — PREDNISONE 5 MG PO TABS: ORAL_TABLET | ORAL | 3 refills | Status: DC

## 2020-10-06 ENCOUNTER — Ambulatory Visit: Payer: Medicare Other | Admitting: Family Medicine

## 2020-11-01 ENCOUNTER — Other Ambulatory Visit: Payer: Self-pay

## 2020-11-01 ENCOUNTER — Encounter: Payer: Self-pay | Admitting: Podiatry

## 2020-11-01 ENCOUNTER — Ambulatory Visit: Payer: Medicare Other | Admitting: Podiatry

## 2020-11-01 DIAGNOSIS — M19072 Primary osteoarthritis, left ankle and foot: Secondary | ICD-10-CM | POA: Diagnosis not present

## 2020-11-01 DIAGNOSIS — G620 Drug-induced polyneuropathy: Secondary | ICD-10-CM

## 2020-11-01 DIAGNOSIS — M19071 Primary osteoarthritis, right ankle and foot: Secondary | ICD-10-CM

## 2020-11-01 DIAGNOSIS — T451X5A Adverse effect of antineoplastic and immunosuppressive drugs, initial encounter: Secondary | ICD-10-CM

## 2020-11-01 NOTE — Progress Notes (Signed)
  Subjective:  Patient ID: Theresa Reid, female    DOB: 1941/01/29,  MRN: [id number]  Chief Complaint  Patient presents with   Foot Pain      np - neuropathy, arthritis    80 y.o. female presents with the above complaint. History confirmed with patient.  Both feet feel numb.  She has in her fingers as well.  Does have some pain and swelling that is more from the arthritis and the neuropathy.  Objective:  Physical Exam: warm, good capillary refill, no trophic changes or ulcerative lesions, and normal DP and PT pulses.  Reduced sensation of the tips of the toes.  Mild warmth and edema over the midfoot  Assessment:   1. Peripheral neuropathy due to chemotherapy ([place])   2. Osteoarthritis of both feet, unspecified osteoarthritis type      Plan:  Patient was evaluated and treated and all questions answered.  Discussed treatment options for both neuropathy and osteoarthritis and factors that can help to mitigate the impact this has on activities of daily living.  Recommended comfortable supportive shoe gear.  Inspect feet daily.  We discussed proprioception and balance training with physical therapy if she has any issues with this from her neuropathy.  Return as needed  Return if symptoms worsen or fail to improve.

## 2020-11-10 ENCOUNTER — Encounter: Payer: Self-pay | Admitting: Radiation Oncology

## 2020-11-10 ENCOUNTER — Other Ambulatory Visit: Payer: Self-pay

## 2020-11-10 ENCOUNTER — Ambulatory Visit
Admission: RE | Admit: 2020-11-10 | Discharge: 2020-11-10 | Disposition: A | Discharge status: Home / Self Care | Payer: Medicare Other | Source: Ambulatory Visit | Attending: Radiation Oncology | Admitting: Radiation Oncology

## 2020-11-10 VITALS — BP 137/68 | HR 93 | Temp 97.0°F | Wt 191.0 lb

## 2020-11-10 DIAGNOSIS — Z853 Personal history of malignant neoplasm of breast: Secondary | ICD-10-CM | POA: Diagnosis not present

## 2020-11-10 DIAGNOSIS — Z79810 Long term (current) use of selective estrogen receptor modulators (SERMs): Secondary | ICD-10-CM | POA: Insufficient documentation

## 2020-11-10 DIAGNOSIS — C50911 Malignant neoplasm of unspecified site of right female breast: Secondary | ICD-10-CM

## 2020-11-10 DIAGNOSIS — C50811 Malignant neoplasm of overlapping sites of right female breast: Secondary | ICD-10-CM | POA: Diagnosis not present

## 2020-11-10 DIAGNOSIS — Z170 Estrogen receptor positive status [ER+]: Secondary | ICD-10-CM

## 2020-11-10 NOTE — Progress Notes (Signed)
Radiation Oncology Follow up Note  Name: Theresa Reid   Date:   11/10/2020 MRN:  [id number] DOB: 12-Apr-1941    This 80 y.o. 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  REFERRING PROVIDER: SLeone Haven MD  HPI: Patient is a an 80year old female now about 18 months having completed right chest wall and peripheral lymphatic radiation.  Seen today in routine follow-up she is doing well.  She specifically denies any new areas of nodularity her right chest wall cough or bone pain..  She is currently on Arimidex tolerant well without side effect.  She had mammograms back in February showing a small group of coarse calcifications in the left breast likely benign.  COMPLICATIONS OF TREATMENT: none  FOLLOW UP COMPLIANCE: keeps appointments   PHYSICAL EXAM:  BP 137/68   Pulse 93   Temp (!) 97 F (36.1 C) (Tympanic)   Wt 191 lb (86.6 kg)   BMI 32.28 kg/m  Patient is status post right modified radical mastectomy.  She does have a sleeve for lymphedema on her right upper extremity.  No chest wall nodular masses noted left breast is free of dominant mass.  No axillary or supraclavicular adenopathy is identified.  Well-developed well-nourished patient in NAD. HEENT reveals PERLA, EOMI, discs not visualized.  Oral cavity is clear. No oral mucosal lesions are identified. Neck is clear without evidence of cervical or supraclavicular adenopathy. Lungs are clear to A&P. Cardiac examination is essentially unremarkable with regular rate and rhythm without murmur rub or thrill. Abdomen is benign with no organomegaly or masses noted. Motor sensory and DTR levels are equal and symmetric in the upper and lower extremities. Cranial nerves II through XII are grossly intact. Proprioception is intact. No  peripheral adenopathy or edema is identified. No motor or sensory levels are noted. Crude visual fields are within normal range.  RADIOLOGY RESULTS: Mammograms reviewed  PLAN: Present time patient is now 18 months out with no evidence of disease on pleased with her overall progress.  She continues on tamoxifen without side effect.  Patient knows to call with any concerns.  I would like to take this opportunity to thank you for allowing me to participate in the care of your patient..Noreene Filbert MD

## 2020-12-08 ENCOUNTER — Ambulatory Visit: Payer: Medicare Other | Admitting: Family Medicine

## 2020-12-13 ENCOUNTER — Inpatient Hospital Stay: Payer: Medicare Other

## 2020-12-13 ENCOUNTER — Inpatient Hospital Stay: Payer: Medicare Other | Attending: Oncology

## 2020-12-13 ENCOUNTER — Inpatient Hospital Stay: Payer: Medicare Other | Admitting: Oncology

## 2020-12-13 ENCOUNTER — Other Ambulatory Visit: Payer: Self-pay

## 2020-12-13 ENCOUNTER — Inpatient Hospital Stay (HOSPITAL_BASED_OUTPATIENT_CLINIC_OR_DEPARTMENT_OTHER): Payer: Medicare Other | Admitting: Oncology

## 2020-12-13 ENCOUNTER — Encounter: Payer: Self-pay | Admitting: Oncology

## 2020-12-13 VITALS — BP 114/62 | HR 103 | Temp 97.4°F | Wt 191.3 lb

## 2020-12-13 DIAGNOSIS — Z79811 Long term (current) use of aromatase inhibitors: Secondary | ICD-10-CM | POA: Insufficient documentation

## 2020-12-13 DIAGNOSIS — C773 Secondary and unspecified malignant neoplasm of axilla and upper limb lymph nodes: Secondary | ICD-10-CM | POA: Diagnosis not present

## 2020-12-13 DIAGNOSIS — C50911 Malignant neoplasm of unspecified site of right female breast: Secondary | ICD-10-CM

## 2020-12-13 DIAGNOSIS — Z79899 Other long term (current) drug therapy: Secondary | ICD-10-CM | POA: Insufficient documentation

## 2020-12-13 DIAGNOSIS — Z170 Estrogen receptor positive status [ER+]: Secondary | ICD-10-CM

## 2020-12-13 LAB — CBC WITH DIFFERENTIAL/PLATELET
Abs Immature Granulocytes: 0.02 10*3/uL (ref 0.00–0.07)
Basophils Absolute: 0 10*3/uL (ref 0.0–0.1)
Basophils Relative: 0 %
Eosinophils Absolute: 0.1 10*3/uL (ref 0.0–0.5)
Eosinophils Relative: 1 %
HCT: 43.6 % (ref 36.0–46.0)
Hemoglobin: 14.6 g/dL (ref 12.0–15.0)
Immature Granulocytes: 0 %
Lymphocytes Relative: 33 %
Lymphs Abs: 2.8 10*3/uL (ref 0.7–4.0)
MCH: 30.4 pg (ref 26.0–34.0)
MCHC: 33.5 g/dL (ref 30.0–36.0)
MCV: 90.6 fL (ref 80.0–100.0)
Monocytes Absolute: 0.7 10*3/uL (ref 0.1–1.0)
Monocytes Relative: 9 %
Neutro Abs: 4.9 10*3/uL (ref 1.7–7.7)
Neutrophils Relative %: 57 %
Platelets: 206 10*3/uL (ref 150–400)
RBC: 4.81 MIL/uL (ref 3.87–5.11)
RDW: 14 % (ref 11.5–15.5)
WBC: 8.6 10*3/uL (ref 4.0–10.5)
nRBC: 0 % (ref 0.0–0.2)

## 2020-12-13 LAB — COMPREHENSIVE METABOLIC PANEL
ALT: 17 U/L (ref 0–44)
AST: 22 U/L (ref 15–41)
Albumin: 3.8 g/dL (ref 3.5–5.0)
Alkaline Phosphatase: 88 U/L (ref 38–126)
Anion gap: 8 (ref 5–15)
BUN: 10 mg/dL (ref 8–23)
CO2: 25 mmol/L (ref 22–32)
Calcium: 8.7 mg/dL — ABNORMAL LOW (ref 8.9–10.3)
Chloride: 106 mmol/L (ref 98–111)
Creatinine, Ser: 0.5 mg/dL (ref 0.44–1.00)
GFR, Estimated: >60 mL/min (ref >60)
Glucose, Bld: 109 mg/dL — ABNORMAL HIGH (ref 70–99)
Potassium: 3.7 mmol/L (ref 3.5–5.1)
Sodium: 139 mmol/L (ref 135–145)
Total Bilirubin: 0.7 mg/dL (ref 0.3–1.2)
Total Protein: 7 g/dL (ref 6.5–8.1)

## 2020-12-13 MED ORDER — SODIUM CHLORIDE 0.9 % IV SOLN
Freq: Once | INTRAVENOUS | Status: AC
  Filled 2020-12-13: qty 250

## 2020-12-13 MED ORDER — HEPARIN SOD (PORK) LOCK FLUSH 100 UNIT/ML IV SOLN
500.0000 [IU] | Freq: Once | INTRAVENOUS | Status: AC
  Administered 2020-12-13: 500 [IU] via INTRAVENOUS
  Filled 2020-12-13: qty 5

## 2020-12-13 MED ORDER — ZOLEDRONIC ACID 4 MG/100ML IV SOLN
4.0000 mg | Freq: Once | INTRAVENOUS | Status: AC
  Administered 2020-12-13: 4 mg via INTRAVENOUS
  Filled 2020-12-13: qty 100

## 2020-12-13 MED ORDER — SODIUM CHLORIDE 0.9% FLUSH
10.0000 mL | Freq: Once | INTRAVENOUS | Status: DC
  Filled 2020-12-13: qty 10

## 2020-12-13 NOTE — Progress Notes (Signed)
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  Patient underwent lumpectomy and sentinel lymph node biopsy in February 2020.  Pathology showed invasive lobular carcinoma with positive medial margin which was reexcised with still positive.  Tumor was 24 mm, grade 2 ER PR positive and HER-2/neu negative.  One sentinel lymph node that was excised was positive for macro met of 8 mm.  No extranodal extension was present.  MammaPrint came back as high risk with an average 10-year risk of untreated disease is 29%.  Patient then underwent a bilateral MRI which showed additional suspicious non-mass enhancement along the inferior margin of the biopsy cavity extending 2 cm highly suspicious for continuous disease and another highly suspicious linear non-mass enhancement at the base of the nipple concerning for multifocal multicentric disease.   Patient underwent right mastectomy and axillary lymph node dissection on 09/20/2018.  There was no residual invasive mammary carcinoma within the breast.  However 15 out of 20 lymph nodes were involved with metastatic carcinoma measuring up to 6 mm and remaining 5 lymph nodes with isolated tumor cells.  Pathologic stage was [id number]   Despite having surgery in May 2020 which was a second surgery-patient has not been able to start adjuvant chemotherapy.  Her mastectomy was complicated by flap necrosis requiring debridement antibiotics and she had to be taken back to the OR for the same. Given her age and postmastectomy complications and delayed wound healing-weekly Taxol x12 cycles was chosen as the regimen for her adjuvant chemotherapy which she completed on 02/25/2019.   Patient started Arimidex in December 2020 and also received dental clearance to start Zometa.  She completed adjuvant radiation treatment  Interval history- patient reports doing well.  She continues to tolerate Arimidex, calcium and vitamin D well.  Denies any breast concerns.  She wears her lymphedema sleeve every day.  She follows up with Gwenette Greet frequently.  Denies any new arm concerns.  Has bilateral shoulder pain but it is tolerable.  ECOG PS- 1 Pain scale- 0  Review of systems- Review of Systems  Constitutional: Negative.  Negative for chills, fever, malaise/fatigue and weight loss.  HENT:  Negative for congestion, ear pain and tinnitus.   Eyes: Negative.  Negative for blurred vision and double vision.  Respiratory: Negative.  Negative for cough, sputum production and shortness of breath.   Cardiovascular: Negative.  Negative for chest pain, palpitations and leg swelling.  Gastrointestinal: Negative.  Negative for abdominal pain, constipation, diarrhea, nausea and vomiting.  Genitourinary:  Negative for dysuria,  frequency and urgency.  Musculoskeletal:  Positive for joint pain. Negative for back pain and falls.       Right  arm lymphedema-wears compression sleeve daily.  Skin: Negative.  Negative for rash.  Neurological: Negative.  Negative for weakness and headaches.  Endo/Heme/Allergies: Negative.  Does not bruise/bleed easily.  Psychiatric/Behavioral: Negative.  Negative for depression. The patient is not nervous/anxious and does not have insomnia.     No Known Allergies   Past Medical History:  Diagnosis Date   Arthritis    Breast cancer ([place])    Diverticulitis    Family history of breast cancer    GERD (gastroesophageal reflux disease)    Hyperlipidemia    Hypertension    Personal history of chemotherapy    Personal history of radiation therapy      Past Surgical History:  Procedure Laterality Date   BREAST BIOPSY Right 05/28/2018   [place] bx, INVASIVE MAMMARY CARCINOMA WITH LOBULAR FEATURES   BREAST LUMPECTOMY Right 06/12/2018   [place], lobular features   BREAST LUMPECTOMY WITH SENTINEL LYMPH NODE BIOPSY Right 06/12/2018   Procedure: RIGHT BREAST WIDE EXCISION WITH SENTINEL LYMPH NODE BX;  Surgeon: Robert Bellow, MD;  Location: [facility] ORS;  Service: General;  Laterality: Right;   [place] Right 10/30/2018   Procedure: IRRIGATION AND DEBRIDEMENT RIGHT CHEST WALL WOUND;  Surgeon: Robert Bellow, MD;  Location: [facility] ORS;  Service: General;  Laterality: Right;   JOINT REPLACEMENT     MASTECTOMY Right    MASTECTOMY WITH AXILLARY LYMPH NODE DISSECTION Right 09/20/2018   Procedure: MASTECTOMY WITH AXILLARY LYMPH NODE DISSECTION RIGHT;  Surgeon: Robert Bellow, MD;  Location: [facility] ORS;  Service: General;  Laterality: Right;   ovaraian cyst removal Right    Cyst only (NOT OVARY)   PORTACATH PLACEMENT Left 09/20/2018   Procedure: INSERTION PORT-A-CATH, LEFT;  Surgeon: Robert Bellow, MD;  Location: [facility] ORS;  Service: General;  Laterality: Left;   TOTAL KNEE ARTHROPLASTY Bilateral 05/05/2010    Social History    Socioeconomic History   Marital status: Single    Spouse name: Not on file   Number of children: Not on file   Years of education: Not on file   Highest education level: Not on file  Occupational History   Occupation: retired  Tobacco Use   Smoking status: Never   Smokeless tobacco: Never  Vaping Use   Vaping Use: Never used  Substance and Sexual Activity   Alcohol use: No    Alcohol/week: 0.0 standard drinks   Drug use: No   Sexual activity: Never  Other Topics Concern   Not on file  Social History Narrative   Retired    Lives by herself    Pets: None   Caffeine- Coffee 2 cups daily, no tea/soda      Social Determinants of Radio broadcast assistant Strain: Low Risk    Difficulty of Paying Living Expenses: Not hard at all  Food Insecurity: No Food Insecurity   Worried About Charity fundraiser in the Last Year: Never true   Arboriculturist in the Last Year: Never true  Transportation Needs: No Transportation Needs   Lack of Transportation (Medical): No   Lack of Transportation (Non-Medical): No  Physical Activity: Not on file  Stress: No Stress Concern Present   Feeling of Stress : Not at all  Social Connections: Not on  file  Intimate Partner Violence: Not At Risk   Fear of Current or Ex-Partner: No   Emotionally Abused: No   Physically Abused: No   Sexually Abused: No    Family History  Problem Relation Age of Onset   Alcohol abuse Mother        deceased 35   Arthritis Mother    Diabetes Father    Breast cancer Maternal Aunt        dx 77s; deceased 8s   Breast cancer Maternal Aunt        dx 33s; deceased 22s   Arthritis Maternal Grandmother      Current Outpatient Medications:    amLODipine (NORVASC) 5 MG tablet, TAKE 1 TABLET BY MOUTH  DAILY, Disp: 90 tablet, Rfl: 3   anastrozole (ARIMIDEX) 1 MG tablet, TAKE 1 TABLET BY MOUTH  DAILY DO NOT START UNTIL  COMPLETED RADIATION IN JAN  2021, Disp: 90 tablet, Rfl: 3   atorvastatin (LIPITOR) 80 MG  tablet, TAKE 1 TABLET BY MOUTH  DAILY, Disp: 90 tablet, Rfl: 3   CALCIUM-VITAMIN D PO, Take 1 tablet by mouth daily. , Disp: , Rfl:    Multiple Vitamins tablet, Take 1 tablet by mouth daily. , Disp: , Rfl:    oxybutynin (DITROPAN) 5 MG tablet, TAKE 1 TABLET BY MOUTH  TWICE DAILY, Disp: 180 tablet, Rfl: 3   predniSONE (DELTASONE) 5 MG tablet, prednisone 5 mg tablet  TAKE 1 TO 2 TABLETS BY MOUTH EVERY DAY AS NEEDED FOR ARTHRITIS FLARE, Disp: 30 tablet, Rfl: 3   Turmeric 500 MG CAPS, Take 1,000 mg by mouth 2 (two) times daily. , Disp: , Rfl:  No current facility-administered medications for this visit.  Facility-Administered Medications Ordered in Other Visits:    sodium chloride flush (NS) 0.9 % injection 10 mL, 10 mL, Intravenous, Once, Sindy Guadeloupe, MD  Physical exam:  There were no vitals filed for this visit.  Physical Exam Constitutional:      Appearance: Normal appearance.  HENT:     Head: Normocephalic and atraumatic.  Eyes:     Pupils: Pupils are equal, round, and reactive to light.  Cardiovascular:     Rate and Rhythm: Normal rate and regular rhythm.     Heart sounds: Normal heart sounds. No murmur heard. Pulmonary:     Effort: Pulmonary effort is normal.     Breath sounds: Normal breath sounds. No wheezing.  Abdominal:     General: Bowel sounds are normal. There is no distension.     Palpations: Abdomen is soft.     Tenderness: There is no abdominal tenderness.  Musculoskeletal:        General: Normal range of motion.     Cervical back: Normal range of motion.  Skin:    General: Skin is warm and dry.     Findings: No rash.  Neurological:     Mental Status: She is alert and oriented to person, place, and time.  Psychiatric:        Judgment: Judgment normal.     CMP Latest Ref Rng & Units 06/11/2020  Glucose 70 - 99 mg/dL 101(H)  BUN 8 - 23 mg/dL 11  Creatinine 0.44 - 1.00 mg/dL 0.53  Sodium 135 - 145 mmol/L 137  Potassium 3.5 - 5.1 mmol/L 3.9  Chloride 98 -  111 mmol/L 103  CO2 22 - 32 mmol/L 24  Calcium 8.9 - 10.3 mg/dL 8.5(L)  Total Protein 6.5 - 8.1 g/dL 7.1  Total Bilirubin  0.3 - 1.2 mg/dL 0.6  Alkaline Phos 38 - 126 U/L 81  AST 15 - 41 U/L 20  ALT 0 - 44 U/L 16   CBC Latest Ref Rng & Units 06/11/2020  WBC 4.0 - 10.5 K/uL 7.8  Hemoglobin 12.0 - 15.0 g/dL 13.4  Hematocrit 36.0 - 46.0 % 40.6  Platelets 150 - 400 K/uL 216    Assessment and plan-Mrs. Blass is an 80 year old female with past medical history significant for stage IIIa ER/PR positive HER2/neu negative status post right mastectomy and axillary lymph node dissection.  She completed 12 cycles of weekly Taxol chemo and adjuvant radiation treatment.  She is stable on Arimidex.  She presents today for lab work and possible Zometa.  Labs from today show a calcium level of 8.7.  Proceed with Zometa.  She will need to return to clinic in 8 to 12 weeks for a port flush and in 6 months for lab work, MD assessment and possible Zometa.  Dr. Bary Castilla will get her mammogram scheduled in February 2022.  Disposition- Return to clinic every 8 to 12 weeks for port flush and in 6 months for lab work, MD assessment and Zometa. Mammogram to be ordered by Dr. Tollie Pizza in February 2022.  Visit Diagnosis No diagnosis found.  I spent 25 minutes dedicated to the care of this patient (face-to-face and non-face-to-face) on the date of the encounter to include what is described in the assessment and plan.  Faythe Casa, NP 12/13/2020 3:01 PM

## 2020-12-16 ENCOUNTER — Other Ambulatory Visit: Payer: Self-pay

## 2020-12-16 ENCOUNTER — Ambulatory Visit (INDEPENDENT_AMBULATORY_CARE_PROVIDER_SITE_OTHER): Payer: Medicare Other | Admitting: Family Medicine

## 2020-12-16 ENCOUNTER — Encounter: Payer: Self-pay | Admitting: Family Medicine

## 2020-12-16 DIAGNOSIS — E785 Hyperlipidemia, unspecified: Secondary | ICD-10-CM

## 2020-12-16 DIAGNOSIS — K219 Gastro-esophageal reflux disease without esophagitis: Secondary | ICD-10-CM | POA: Diagnosis not present

## 2020-12-16 DIAGNOSIS — I10 Essential (primary) hypertension: Secondary | ICD-10-CM | POA: Diagnosis not present

## 2020-12-16 DIAGNOSIS — R7303 Prediabetes: Secondary | ICD-10-CM | POA: Insufficient documentation

## 2020-12-16 DIAGNOSIS — L219 Seborrheic dermatitis, unspecified: Secondary | ICD-10-CM

## 2020-12-16 DIAGNOSIS — L821 Other seborrheic keratosis: Secondary | ICD-10-CM

## 2020-12-16 LAB — POCT GLYCOSYLATED HEMOGLOBIN (HGB A1C): Hemoglobin A1C: 5.5 % (ref 4.0–5.6)

## 2020-12-16 MED ORDER — OMEPRAZOLE 20 MG PO CPDR: 20.0000 mg | DELAYED_RELEASE_CAPSULE | Freq: Every day | ORAL | 0 refills | Status: DC

## 2020-12-16 MED ORDER — KETOCONAZOLE 2 % EX SHAM: 1.0000 "application " | MEDICATED_SHAMPOO | CUTANEOUS | 0 refills | Status: DC

## 2020-12-16 NOTE — Assessment & Plan Note (Signed)
Check A1c. 

## 2020-12-16 NOTE — Assessment & Plan Note (Signed)
Refer to dermatology for the seborrheic keratosis on her right cheek

## 2020-12-16 NOTE — Patient Instructions (Addendum)
Nice to see you. Please take the omeprazole for the indigestion.  Please let me know if this does not help.  If you have any recurrence of your symptoms after completing this course please let me know and we can have you see GI. Dermatology should contact you to schedule an appointment. You can use the ketoconazole shampoo to see if that will help if you have recurrence of your scalp issue.

## 2020-12-16 NOTE — Assessment & Plan Note (Signed)
Chronic issue.  Only occasionally bothers her.  We will treat with a 2-week course of omeprazole.  If it becomes more of a persistent issue or persist despite treatment we will have her see GI.

## 2020-12-16 NOTE — Assessment & Plan Note (Signed)
Currently the scalp is without any irritation.  We will send in ketoconazole for her to use if this becomes an issue.  We will also refer her to dermatology.

## 2020-12-16 NOTE — Assessment & Plan Note (Signed)
Continue amlodipine 5 mg once daily.  Blood pressure is well controlled.

## 2020-12-16 NOTE — Progress Notes (Signed)
Tommi Rumps, MD Phone: [phone]  Theresa Reid is a 80 y.o. female who presents today for follow-up.  Hypertension: Not checking blood pressures.  Taking amlodipine.  No chest pain, shortness of breath, or edema.  Hyperlipidemia: Taking Lipitor.  Neuro provider pain, myalgias, or claudication.  Seborrheic dermatitis: Patient notes this comes and goes in her scalp.  She previously used ketoconazole and this did resolve the issue.  Notes its not bothering her too much now though she does request a dermatology referral.  GERD: Patient notes intermittent issues with reflux going back many years.  Notes she was on NSAIDs previously and that precipitated a lot of it.  Now she rarely gets reflux symptoms that lasts for a few days and then goes away for weeks.  She will burp and have burning into her esophagus.  No abdominal pain, dysphagia, blood in her stool, or weight loss.  She does drink 1 cup of coffee daily.  She avoids other caffeinated beverages.  She tries to avoid any foods that could contribute to symptoms.  Social History   Tobacco Use  Smoking Status Never  Smokeless Tobacco Never    Current Outpatient Medications on File Prior to Visit  Medication Sig Dispense Refill   amLODipine (NORVASC) 5 MG tablet TAKE 1 TABLET BY MOUTH  DAILY 90 tablet 3   anastrozole (ARIMIDEX) 1 MG tablet TAKE 1 TABLET BY MOUTH  DAILY DO NOT START UNTIL  COMPLETED RADIATION IN JAN  2021 90 tablet 3   atorvastatin (LIPITOR) 80 MG tablet TAKE 1 TABLET BY MOUTH  DAILY 90 tablet 3   CALCIUM-VITAMIN D PO Take 1 tablet by mouth daily.      Multiple Vitamins tablet Take 1 tablet by mouth daily.      oxybutynin (DITROPAN) 5 MG tablet TAKE 1 TABLET BY MOUTH  TWICE DAILY 180 tablet 3   predniSONE (DELTASONE) 5 MG tablet prednisone 5 mg tablet  TAKE 1 TO 2 TABLETS BY MOUTH EVERY DAY AS NEEDED FOR ARTHRITIS FLARE 30 tablet 3   Turmeric 500 MG CAPS Take 1,000 mg by mouth 2 (two) times daily.       [DISCONTINUED] prochlorperazine (COMPAZINE) 10 MG tablet Take 1 tablet (10 mg total) by mouth every 6 (six) hours as needed (Nausea or vomiting). 30 tablet 1   No current facility-administered medications on file prior to visit.     ROS see history of present illness  Objective  Physical Exam Vitals:   12/16/20 1321  BP: 105/70  Pulse: 81  Temp: 97.8 F (36.6 C)  SpO2: 97%    BP Readings from Last 3 Encounters:  12/16/20 105/70  12/13/20 114/62  11/10/20 137/68   Wt Readings from Last 3 Encounters:  12/16/20 191 lb 12.8 oz (87 kg)  12/13/20 191 lb 4.8 oz (86.8 kg)  11/10/20 191 lb (86.6 kg)    Physical Exam Constitutional:      General: She is not in acute distress.    Appearance: She is not diaphoretic.  Cardiovascular:     Rate and Rhythm: Normal rate and regular rhythm.     Heart sounds: Normal heart sounds.  Pulmonary:     Effort: Pulmonary effort is normal.     Breath sounds: Normal breath sounds.  Abdominal:     General: Bowel sounds are normal. There is no distension.     Palpations: Abdomen is soft.     Tenderness: There is no abdominal tenderness. There is no guarding or rebound.  Skin:  General: Skin is warm and dry.     Comments: Left scalp without flaking or inflammation, there is a seborrheic keratosis on her right cheek  Neurological:     Mental Status: She is alert.     Assessment/Plan: Please see individual problem list.  Problem List Items Addressed This Visit     GERD (gastroesophageal reflux disease)    Chronic issue.  Only occasionally bothers her.  We will treat with a 2-week course of omeprazole.  If it becomes more of a persistent issue or persist despite treatment we will have her see GI.      Relevant Medications   omeprazole (PRILOSEC) 20 MG capsule   HLD (hyperlipidemia)    Continue Lipitor 80 mg once daily.      Hypertension    Continue amlodipine 5 mg once daily.  Blood pressure is well controlled.      Prediabetes     Check A1c.      Relevant Orders   POCT HgB A1C (Completed)   Seborrheic dermatitis    Currently the scalp is without any irritation.  We will send in ketoconazole for her to use if this becomes an issue.  We will also refer her to dermatology.      Relevant Medications   ketoconazole (NIZORAL) 2 % shampoo   Other Relevant Orders   Ambulatory referral to Dermatology   Seborrheic keratosis    Refer to dermatology for the seborrheic keratosis on her right cheek      Relevant Orders   Ambulatory referral to Dermatology   Return in about 6 months (around 06/18/2021) for Hypertension, prediabetes.  This visit occurred during the SARS-CoV-2 public health emergency.  Safety protocols were in place, including screening questions prior to the visit, additional usage of staff PPE, and extensive cleaning of exam room while observing appropriate contact time as indicated for disinfecting solutions.    Tommi Rumps, MD [place]

## 2020-12-16 NOTE — Assessment & Plan Note (Signed)
Continue Lipitor 80 mg once daily.

## 2021-02-07 ENCOUNTER — Inpatient Hospital Stay: Payer: Medicare Other | Attending: Oncology

## 2021-02-07 ENCOUNTER — Other Ambulatory Visit: Payer: Self-pay

## 2021-02-07 DIAGNOSIS — Z452 Encounter for adjustment and management of vascular access device: Secondary | ICD-10-CM | POA: Diagnosis not present

## 2021-02-07 DIAGNOSIS — C50911 Malignant neoplasm of unspecified site of right female breast: Secondary | ICD-10-CM | POA: Insufficient documentation

## 2021-02-07 DIAGNOSIS — Z95828 Presence of other vascular implants and grafts: Secondary | ICD-10-CM

## 2021-02-07 MED ORDER — HEPARIN SOD (PORK) LOCK FLUSH 100 UNIT/ML IV SOLN
INTRAVENOUS | Status: AC
  Filled 2021-02-07: qty 5

## 2021-02-07 MED ORDER — SODIUM CHLORIDE 0.9% FLUSH
10.0000 mL | Freq: Once | INTRAVENOUS | Status: AC
  Administered 2021-02-07: 10 mL via INTRAVENOUS
  Filled 2021-02-07: qty 10

## 2021-02-07 MED ORDER — HEPARIN SOD (PORK) LOCK FLUSH 100 UNIT/ML IV SOLN
500.0000 [IU] | Freq: Once | INTRAVENOUS | Status: AC
Start: 2021-02-07 — End: 2021-02-07
  Administered 2021-02-07: 500 [IU] via INTRAVENOUS
  Filled 2021-02-07: qty 5

## 2021-02-08 ENCOUNTER — Encounter: Payer: Self-pay | Admitting: Oncology

## 2021-03-28 ENCOUNTER — Ambulatory Visit: Payer: Medicare Other | Attending: Oncology | Admitting: Occupational Therapy

## 2021-03-28 DIAGNOSIS — I972 Postmastectomy lymphedema syndrome: Secondary | ICD-10-CM | POA: Diagnosis not present

## 2021-03-28 NOTE — Therapy (Signed)
[facility] [address], [place], [address] Phone: [phone]   Fax:  [phone]  Occupational Therapy Re-eval  Patient Details  Name: Theresa Reid MRN: [id number] Date of Birth: 24-May-1940 No data recorded  Encounter Date: 03/28/2021   OT End of Session - 03/28/21 1506     Visit Number 15    Number of Visits 17    Date for OT Re-Evaluation 05/23/21    OT Start Time 1430    OT Stop Time 1502    OT Time Calculation (min) 32 min    Activity Tolerance Patient tolerated treatment well    Behavior During Therapy WFL for tasks assessed/performed             Past Medical History:  Diagnosis Date   Arthritis    Breast cancer ([place])    Diverticulitis    Family history of breast cancer    GERD (gastroesophageal reflux disease)    Hyperlipidemia    Hypertension    Personal history of chemotherapy    Personal history of radiation therapy     Past Surgical History:  Procedure Laterality Date   BREAST BIOPSY Right 05/28/2018   [place] bx, INVASIVE MAMMARY CARCINOMA WITH LOBULAR FEATURES   BREAST LUMPECTOMY Right 06/12/2018   [place], lobular features   BREAST LUMPECTOMY WITH SENTINEL LYMPH NODE BIOPSY Right 06/12/2018   Procedure: RIGHT BREAST WIDE EXCISION WITH SENTINEL LYMPH NODE BX;  Surgeon: Robert Bellow, MD;  Location: [facility] OR[facility];  Service: General;  Laterality: Right;   [place] Right 10/30/2018   Procedure: IRRIGATION AND DEBRIDEMENT RIGHT CHEST WALL WOUND;  Surgeon: Robert Bellow, MD;  Location: [facility] OR[facility];  Service: General;  Laterality: Right;   JOINT REPLACEMENT     MASTECTOMY Right    MASTECTOMY WITH AXILLARY LYMPH NODE DISSECTION Right 09/20/2018   Procedure: MASTECTOMY WITH AXILLARY LYMPH NODE DISSECTION RIGHT;  Surgeon: Robert Bellow, MD;  Location: [facility] OR[facility];  Service: General;  Laterality: Right;   ovaraian cyst  removal Right    Cyst only (NOT OVARY)   PORTACATH PLACEMENT Left 09/20/2018   Procedure: INSERTION PORT-A-CATH, LEFT;  Surgeon: Robert Bellow, MD;  Location: [facility] OR[facility];  Service: General;  Laterality: Left;   TOTAL KNEE ARTHROPLASTY Bilateral 05/05/2010    There were no vitals filed for this visit.   Subjective Assessment - 03/28/21 1505     Subjective  Need you to see if doing okay with my sleeve and time to get new compression sleeve    Pertinent History Patient had her original lumpectomy and sentinel lymph node biopsy back in February 2020 R breast   She then had an MRI which showed more areas of enhancement and was taken back to the OR for mastectomy and axillary lymph node dissection on 09/20/2018 which showed 15 of the lymph nodes were also positive for malignancy and this upstaged her to stage IIIa disease.  Plan was to offer her adjuvant chemotherapy following this.  However her mastectomy course was complicated by flap necrosis requiring debridement antibiotics and she had to be also taken to the OR on 10/30/2018.  Her wound has not completely healed yet and she still has a drain in place.  She seen  Dr. Dahlia Byes on 11/18/2018.  There is concern for early cellulitis of her chest wall.  Dr. Dahlia Byes after his visit started  patient  on Augmentin for 2 more weeks which further delays her chemotherapy. Lymphedema in R UE per pt developed after surgery on 10/30/2018 - stayed the same - not going down - denies pain - pt comes every about 5-7 months for re assessent and getting replacement for her daytime compression sleeve    Patient Stated Goals I want to get sleeve so my arm don't  get big and to  prevent infection    Currently in Pain? No/denies                 LYMPHEDEMA/ONCOLOGY QUESTIONNAIRE - 03/28/21 0001       Right Upper Extremity Lymphedema   15 cm Proximal to Olecranon Process 38 cm    10 cm Proximal to Olecranon Process 37.5 cm    Olecranon Process 24.2 cm    15 cm Proximal to  Ulnar Styloid Process 22 cm    10 cm Proximal to Ulnar Styloid Process 18.6 cm    Just Proximal to Ulnar Styloid Process 14 cm    Across Hand at [employer] 17.5 cm    At [place] of 2nd Digit 5.5 cm    At [facility] of Thumb 5.8 cm      Left Upper Extremity Lymphedema   15 cm Proximal to Olecranon Process 39.5 cm    10 cm Proximal to Olecranon Process 38 cm    Olecranon Process 25 cm    15 cm Proximal to Ulnar Styloid Process 21 cm    10 cm Proximal to Ulnar Styloid Process 18.5 cm    Just Proximal to Ulnar Styloid Process 14.5 cm    Across Hand at [employer] 17.5 cm                   Pt was seen last time 5/22 Pt Jobst Elvarex soft sleeve and glove still fitting well - about 7  months old  - so do need new one Pt report that she doe have at times some  swelling under her R arm and chest in the night time and morning Remind again pt to use her jovipak breast pad  Measurements decrease compare to last time assess- done great with last sleeve and glove -  Not wearing any compression garments at time - able to maintain her  lymphedema with custom compression sleeve and glove   see flow sheet for circumference Will contact [employer] for assistance           OT Education - 03/28/21 1505     Education Details compression sleeve and garments    Person(s) Educated Patient    Methods Explanation;Demonstration;Tactile cues;Verbal cues;Handout    Comprehension Returned demonstration;Verbalized understanding              OT Short Term Goals - 06/30/19 1337       OT SHORT TERM GOAL #1   Title Pt's R UE circumference decrease by at least 1cm -2 cm from hand to upper arm - to be fitted for appropriate compression garments    Status Achieved               OT Long Term Goals - 03/28/21 1510       OT LONG TERM GOAL #2   Title Pt maintain her R UE and thoracic lymphedema  circumference with correct compression garments and HEP    Baseline She is wearing  jovipak breastpad but per pt not as she should -and her daytime sleeve and  glove older than 7 months - need new    Time 8    Period Weeks    Status New    Target Date 05/23/21                   Plan - 03/28/21 1507     Clinical Impression Statement Pt with diagnosis of R UE lymphedema - she done great thru the years to maintain her R UE circumference with Jobst Elvarex sleeve and glove and jovipak breast pad as needed- was fitted in May 22 the last time -and done great with maintaining her circumference in R UE with only custom daytime compressio sleeve and glove. Reinforce again with pt to use her Jovipak breastpad  as needed night time or day time to  clear thoracic lymphedema - will contact [employer] for assistance for glove and sleeve- pt to contact me when she get her new sleeve and glove to assess fit    OT Occupational Profile and History Problem Focused Assessment - Including review of records relating to presenting problem    Occupational performance deficits (Please refer to evaluation for details): ADL's;Play;Leisure;IADL's    Body Structure / Function / Physical Skills ADL;UE functional use;Scar mobility;Skin integrity;Edema    Rehab Potential Good    Clinical Decision Making Several treatment options, min-mod task modification necessary    Comorbidities Affecting Occupational Performance: May have comorbidities impacting occupational performance    Modification or Assistance to Complete Evaluation  No modification of tasks or assist necessary to complete eval    OT Frequency Monthly    OT Duration 8 weeks    OT Treatment/Interventions Self-care/ADL training;Manual lymph drainage;Therapeutic exercise;Patient/family education;Manual Therapy;Compression bandaging    OT Home Exercise Plan see pt instruction    Consulted and Agree with Plan of Care Patient             Patient will benefit from skilled therapeutic intervention in order to improve the following  deficits and impairments:   Body Structure / Function / Physical Skills: ADL, UE functional use, Scar mobility, Skin integrity, Edema       Visit Diagnosis: Postmastectomy lymphedema syndrome - Plan: Ot plan of care cert/re-cert    Problem List Patient Active Problem List   Diagnosis Date Noted   Prediabetes 12/16/2020   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   Elevated glucose 08/09/2020   Cataract 09/24/2018   Urge incontinence of urine 09/24/2018   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 [place], [address] Phone: [phone]   Fax:  [phone]  Name: Theresa Reid MRN: [id number] Date of Birth: May 05, 1940

## 2021-04-01 DIAGNOSIS — Z9011 Acquired absence of right breast and nipple: Secondary | ICD-10-CM | POA: Diagnosis not present

## 2021-04-04 ENCOUNTER — Inpatient Hospital Stay: Payer: Medicare Other | Attending: Oncology

## 2021-04-04 ENCOUNTER — Other Ambulatory Visit: Payer: Self-pay

## 2021-04-04 DIAGNOSIS — C50911 Malignant neoplasm of unspecified site of right female breast: Secondary | ICD-10-CM | POA: Diagnosis not present

## 2021-04-04 DIAGNOSIS — Z452 Encounter for adjustment and management of vascular access device: Secondary | ICD-10-CM | POA: Insufficient documentation

## 2021-04-04 DIAGNOSIS — Z95828 Presence of other vascular implants and grafts: Secondary | ICD-10-CM

## 2021-04-04 MED ORDER — SODIUM CHLORIDE 0.9% FLUSH
10.0000 mL | INTRAVENOUS | Status: DC | PRN
  Administered 2021-04-04: 10 mL via INTRAVENOUS
  Filled 2021-04-04: qty 10

## 2021-04-04 MED ORDER — HEPARIN SOD (PORK) LOCK FLUSH 100 UNIT/ML IV SOLN
500.0000 [IU] | Freq: Once | INTRAVENOUS | Status: AC
  Administered 2021-04-04: 500 [IU] via INTRAVENOUS
  Filled 2021-04-04: qty 5

## 2021-04-04 MED ORDER — HEPARIN SOD (PORK) LOCK FLUSH 100 UNIT/ML IV SOLN
INTRAVENOUS | Status: AC
  Filled 2021-04-04: qty 5

## 2021-04-06 ENCOUNTER — Ambulatory Visit (INDEPENDENT_AMBULATORY_CARE_PROVIDER_SITE_OTHER): Payer: Medicare Other | Admitting: Family Medicine

## 2021-04-06 ENCOUNTER — Other Ambulatory Visit: Payer: Self-pay

## 2021-04-06 ENCOUNTER — Encounter: Payer: Self-pay | Admitting: Family Medicine

## 2021-04-06 DIAGNOSIS — K219 Gastro-esophageal reflux disease without esophagitis: Secondary | ICD-10-CM | POA: Diagnosis not present

## 2021-04-06 MED ORDER — OMEPRAZOLE 20 MG PO CPDR: 20.0000 mg | DELAYED_RELEASE_CAPSULE | Freq: Every day | ORAL | 0 refills | Status: DC

## 2021-04-06 NOTE — Assessment & Plan Note (Signed)
This did improve after taking omeprazole though has recurred.  I discussed referral to GI given the duration of her reflux issues though she declines that at this time and would like to see how she does with another course of omeprazole.  If her symptoms recur at any point following use of the omeprazole she will let me know and I will refer to GI.  We will complete another 14-day course of omeprazole.

## 2021-04-06 NOTE — Progress Notes (Signed)
Virtual Visit via telephone Note  This visit type was conducted due to national recommendations for restrictions regarding the COVID-19 pandemic (e.g. social distancing).  This format is felt to be most appropriate for this patient at this time.  All issues noted in this document were discussed and addressed.  No physical exam was performed (except for noted visual exam findings with Video Visits).   I connected with Theresa Reid today at 11:30 AM EST by telephone and verified that I am speaking with the correct person using two identifiers. Location patient: home Location provider: work  Persons participating in the virtual visit: patient, provider  I discussed the limitations, risks, security and privacy concerns of performing an evaluation and management service by telephone and the availability of in person appointments. I also discussed with the patient that there may be a patient responsible charge related to this service. The patient expressed understanding and agreed to proceed.  Interactive audio and video telecommunications were attempted between this provider and patient, however failed, due to patient having technical difficulties OR patient did not have access to video capability.  We continued and completed visit with audio only.   Reason for visit: f/u.  HPI: GERD:   Reflux symptoms: resolved with taking omeprazole for 14 days, though 3-4 days after stopping it the symptoms returned though were less than previously   Abd pain: no   Blood in stool: no  Dysphagia: no   EGD: no  Medication: tums at this time She reports there are certain foods that make this worse.  She does drink some coffee.  No alcohol.  She reports a 20-year history of reflux.    ROS: See pertinent positives and negatives per HPI.  Past Medical History:  Diagnosis Date   Arthritis    Breast cancer ([place])    Diverticulitis    Family history of breast cancer    GERD (gastroesophageal reflux  disease)    Hyperlipidemia    Hypertension    Personal history of chemotherapy    Personal history of radiation therapy     Past Surgical History:  Procedure Laterality Date   BREAST BIOPSY Right 05/28/2018   [place] bx, INVASIVE MAMMARY CARCINOMA WITH LOBULAR FEATURES   BREAST LUMPECTOMY Right 06/12/2018   [place], lobular features   BREAST LUMPECTOMY WITH SENTINEL LYMPH NODE BIOPSY Right 06/12/2018   Procedure: RIGHT BREAST WIDE EXCISION WITH SENTINEL LYMPH NODE BX;  Surgeon: Robert Bellow, MD;  Location: [facility];  Service: General;  Laterality: Right;   [place] Right 10/30/2018   Procedure: IRRIGATION AND DEBRIDEMENT RIGHT CHEST WALL WOUND;  Surgeon: Robert Bellow, MD;  Location: [facility];  Service: General;  Laterality: Right;   JOINT REPLACEMENT     MASTECTOMY Right    MASTECTOMY WITH AXILLARY LYMPH NODE DISSECTION Right 09/20/2018   Procedure: MASTECTOMY WITH AXILLARY LYMPH NODE DISSECTION RIGHT;  Surgeon: Robert Bellow, MD;  Location: [facility];  Service: General;  Laterality: Right;   ovaraian cyst removal Right    Cyst only (NOT OVARY)   PORTACATH PLACEMENT Left 09/20/2018   Procedure: INSERTION PORT-A-CATH, LEFT;  Surgeon: Robert Bellow, MD;  Location: [facility];  Service: General;  Laterality: Left;   TOTAL KNEE ARTHROPLASTY Bilateral 05/05/2010    Family History  Problem Relation Age of Onset   Alcohol abuse Mother        deceased 24   Arthritis  Mother    Diabetes Father    Breast cancer Maternal Aunt        dx 47s; deceased 59s   Breast cancer Maternal Aunt        dx 5s; deceased 39s   Arthritis Maternal Grandmother     SOCIAL HX: nonsmoker   Current Outpatient Medications:    amLODipine (NORVASC) 5 MG tablet, TAKE 1 TABLET BY MOUTH  DAILY, Disp: 90 tablet, Rfl: 3   anastrozole (ARIMIDEX) 1 MG tablet, TAKE 1 TABLET BY MOUTH  DAILY DO NOT START UNTIL  COMPLETED RADIATION IN JAN   2021, Disp: 90 tablet, Rfl: 3   atorvastatin (LIPITOR) 80 MG tablet, TAKE 1 TABLET BY MOUTH  DAILY, Disp: 90 tablet, Rfl: 3   CALCIUM-VITAMIN D PO, Take 1 tablet by mouth daily. , Disp: , Rfl:    ketoconazole (NIZORAL) 2 % shampoo, Apply 1 application topically 2 (two) times a week., Disp: 120 mL, Rfl: 0   Multiple Vitamins tablet, Take 1 tablet by mouth daily. , Disp: , Rfl:    oxybutynin (DITROPAN) 5 MG tablet, TAKE 1 TABLET BY MOUTH  TWICE DAILY, Disp: 180 tablet, Rfl: 3   predniSONE (DELTASONE) 5 MG tablet, prednisone 5 mg tablet  TAKE 1 TO 2 TABLETS BY MOUTH EVERY DAY AS NEEDED FOR ARTHRITIS FLARE, Disp: 30 tablet, Rfl: 3   Turmeric 500 MG CAPS, Take 1,000 mg by mouth 2 (two) times daily. , Disp: , Rfl:    omeprazole (PRILOSEC) 20 MG capsule, Take 1 capsule (20 mg total) by mouth daily for 14 days., Disp: 14 capsule, Rfl: 0  EXAM: This is a telephone visit and thus no exam was completed.  ASSESSMENT AND PLAN:  Discussed the following assessment and plan:  Problem List Items Addressed This Visit     GERD (gastroesophageal reflux disease)    This did improve after taking omeprazole though has recurred.  I discussed referral to GI given the duration of her reflux issues though she declines that at this time and would like to see how she does with another course of omeprazole.  If her symptoms recur at any point following use of the omeprazole she will let me know and I will refer to GI.  We will complete another 14-day course of omeprazole.      Relevant Medications   omeprazole (PRILOSEC) 20 MG capsule    Return for As scheduled in February.   I discussed the assessment and treatment plan with the patient. The patient was provided an opportunity to ask questions and all were answered. The patient agreed with the plan and demonstrated an understanding of the instructions.   The patient was advised to call back or seek an in-person evaluation if the symptoms worsen or if the condition  fails to improve as anticipated.  I provided 8 minutes of non-face-to-face time during this encounter.   Tommi Rumps, MD

## 2021-05-05 DIAGNOSIS — H2513 Age-related nuclear cataract, bilateral: Secondary | ICD-10-CM | POA: Diagnosis not present

## 2021-05-20 ENCOUNTER — Ambulatory Visit (INDEPENDENT_AMBULATORY_CARE_PROVIDER_SITE_OTHER): Payer: Medicare Other

## 2021-05-20 VITALS — Ht 64.0 in | Wt 180.0 lb

## 2021-05-20 DIAGNOSIS — Z0000 Encounter for general adult medical examination without abnormal findings: Secondary | ICD-10-CM | POA: Diagnosis not present

## 2021-05-20 NOTE — Patient Instructions (Addendum)
Theresa Reid , Thank you for taking time to come for your Medicare Wellness Visit. I appreciate your ongoing commitment to your health goals. Please review the following plan we discussed and let me know if I can assist you in the future.   These are the goals we discussed:  Goals       Patient Stated     Maintain Healthy Lifestyle (pt-stated)      Stay active Stay healthy        This is a list of the screening recommended for you and due dates:  Health Maintenance  Topic Date Due   Zoster (Shingles) Vaccine (2 of 2) 08/18/2021*   Tetanus Vaccine  01/23/2027   Pneumonia Vaccine  Completed   Flu Shot  Completed   DEXA scan (bone density measurement)  Completed   COVID-19 Vaccine  Completed   HPV Vaccine  Aged Out  *Topic was postponed. The date shown is not the original due date.    Advanced directives: on file  Conditions/risks identified: none new  Follow up in one year for your annual wellness visit    Preventive Care 65 Years and Older, Female Preventive care refers to lifestyle choices and visits with your health care provider that can promote health and wellness. What does preventive care include? A yearly physical exam. This is also called an annual well check. Dental exams once or twice a year. Routine eye exams. Ask your health care provider how often you should have your eyes checked. Personal lifestyle choices, including: Daily care of your teeth and gums. Regular physical activity. Eating a healthy diet. Avoiding tobacco and drug use. Limiting alcohol use. Practicing safe sex. Taking low-dose aspirin every day. Taking vitamin and mineral supplements as recommended by your health care provider. What happens during an annual well check? The services and screenings done by your health care provider during your annual well check will depend on your age, overall health, lifestyle risk factors, and family history of disease. Counseling  Your health care  provider may ask you questions about your: Alcohol use. Tobacco use. Drug use. Emotional well-being. Home and relationship well-being. Sexual activity. Eating habits. History of falls. Memory and ability to understand (cognition). Work and work Statistician. Reproductive health. Screening  You may have the following tests or measurements: Height, weight, and BMI. Blood pressure. Lipid and cholesterol levels. These may be checked every 5 years, or more frequently if you are over 3 years old. Skin check. Lung cancer screening. You may have this screening every year starting at age 39 if you have a 30-pack-year history of smoking and currently smoke or have quit within the past 15 years. Fecal occult blood test (FOBT) of the stool. You may have this test every year starting at age 20. Flexible sigmoidoscopy or colonoscopy. You may have a sigmoidoscopy every 5 years or a colonoscopy every 10 years starting at age 53. Hepatitis C blood test. Hepatitis B blood test. Sexually transmitted disease (STD) testing. Diabetes screening. This is done by checking your blood sugar (glucose) after you have not eaten for a while (fasting). You may have this done every 1-3 years. Bone density scan. This is done to screen for osteoporosis. You may have this done starting at age 83. Mammogram. This may be done every 1-2 years. Talk to your health care provider about how often you should have regular mammograms. Talk with your health care provider about your test results, treatment options, and if necessary, the need for more  tests. Vaccines  Your health care provider may recommend certain vaccines, such as: Influenza vaccine. This is recommended every year. Tetanus, diphtheria, and acellular pertussis (Tdap, Td) vaccine. You may need a Td booster every 10 years. Zoster vaccine. You may need this after age 49. Pneumococcal 13-valent conjugate (PCV13) vaccine. One dose is recommended after age  45. Pneumococcal polysaccharide (PPSV23) vaccine. One dose is recommended after age 11. Talk to your health care provider about which screenings and vaccines you need and how often you need them. This information is not intended to replace advice given to you by your health care provider. Make sure you discuss any questions you have with your health care provider. Document Released: 05/07/2015 Document Revised: 12/29/2015 Document Reviewed: 02/09/2015 Elsevier Interactive Patient Education  2017 [place] Prevention in the Home Falls can cause injuries. They can happen to people of all ages. There are many things you can do to make your home safe and to help prevent falls. What can I do on the outside of my home? Regularly fix the edges of walkways and driveways and fix any cracks. Remove anything that might make you trip as you walk through a door, such as a raised step or threshold. Trim any bushes or trees on the path to your home. Use bright outdoor lighting. Clear any walking paths of anything that might make someone trip, such as rocks or tools. Regularly check to see if handrails are loose or broken. Make sure that both sides of any steps have handrails. Any raised decks and porches should have guardrails on the edges. Have any leaves, snow, or ice cleared regularly. Use sand or salt on walking paths during winter. Clean up any spills in your garage right away. This includes oil or grease spills. What can I do in the bathroom? Use night lights. Install grab bars by the toilet and in the tub and shower. Do not use towel bars as grab bars. Use non-skid mats or decals in the tub or shower. If you need to sit down in the shower, use a plastic, non-slip stool. Keep the floor dry. Clean up any water that spills on the floor as soon as it happens. Remove soap buildup in the tub or shower regularly. Attach bath mats securely with double-sided non-slip rug tape. Do not have throw  rugs and other things on the floor that can make you trip. What can I do in the bedroom? Use night lights. Make sure that you have a light by your bed that is easy to reach. Do not use any sheets or blankets that are too big for your bed. They should not hang down onto the floor. Have a firm chair that has side arms. You can use this for support while you get dressed. Do not have throw rugs and other things on the floor that can make you trip. What can I do in the kitchen? Clean up any spills right away. Avoid walking on wet floors. Keep items that you use a lot in easy-to-reach places. If you need to reach something above you, use a strong step stool that has a grab bar. Keep electrical cords out of the way. Do not use floor polish or wax that makes floors slippery. If you must use wax, use non-skid floor wax. Do not have throw rugs and other things on the floor that can make you trip. What can I do with my stairs? Do not leave any items on the stairs. Make sure  that there are handrails on both sides of the stairs and use them. Fix handrails that are broken or loose. Make sure that handrails are as long as the stairways. Check any carpeting to make sure that it is firmly attached to the stairs. Fix any carpet that is loose or worn. Avoid having throw rugs at the top or bottom of the stairs. If you do have throw rugs, attach them to the floor with carpet tape. Make sure that you have a light switch at the top of the stairs and the bottom of the stairs. If you do not have them, ask someone to add them for you. What else can I do to help prevent falls? Wear shoes that: Do not have high heels. Have rubber bottoms. Are comfortable and fit you well. Are closed at the toe. Do not wear sandals. If you use a stepladder: Make sure that it is fully opened. Do not climb a closed stepladder. Make sure that both sides of the stepladder are locked into place. Ask someone to hold it for you, if  possible. Clearly mark and make sure that you can see: Any grab bars or handrails. First and last steps. Where the edge of each step is. Use tools that help you move around (mobility aids) if they are needed. These include: Canes. Walkers. Scooters. Crutches. Turn on the lights when you go into a dark area. Replace any light bulbs as soon as they burn out. Set up your furniture so you have a clear path. Avoid moving your furniture around. If any of your floors are uneven, fix them. If there are any pets around you, be aware of where they are. Review your medicines with your doctor. Some medicines can make you feel dizzy. This can increase your chance of falling. Ask your doctor what other things that you can do to help prevent falls. This information is not intended to replace advice given to you by your health care provider. Make sure you discuss any questions you have with your health care provider. Document Released: 02/04/2009 Document Revised: 09/16/2015 Document Reviewed: 05/15/2014 Elsevier Interactive Patient Education  2017 [employer].

## 2021-05-20 NOTE — Progress Notes (Addendum)
Subjective:   Theresa Reid is a 81 y.o. female who presents for Medicare Annual (Subsequent) preventive examination.  Review of Systems    No ROS.  Medicare Wellness Virtual Visit.  Visual/audio telehealth visit, UTA vital signs.   See social history for additional risk factors.   Cardiac Risk Factors include: advanced age (>11men, >23 women)     Objective:    Today's Vitals   05/20/21 1121  Weight: 180 lb (81.6 kg)  Height: 5\' 4"  (1.626 m)   Body mass index is 30.9 kg/m.  Advanced Directives 05/20/2021 06/11/2020 05/19/2020 11/05/2019 08/26/2019 06/09/2019 06/02/2019  Does Patient Have a Medical Advance Directive? Yes Yes Yes Yes Yes Yes Yes  Type of Paramedic of [place];Living will [place];Living will [place];Living will - [place];Living will [place];Living will [place];Living will  Does patient want to make changes to medical advance directive? No - Patient declined - No - Patient declined No - Patient declined - No - Patient declined No - Patient declined  Copy of [place] in Chart? Yes - validated most recent copy scanned in chart (See row information) - Yes - validated most recent copy scanned in chart (See row information) No - copy requested - No - copy requested No - copy requested  Would patient like information on creating a medical advance directive? - - - No - Patient declined - No - Patient declined No - Patient declined    Current Medications (verified) Outpatient Encounter Medications as of 05/20/2021  Medication Sig   amLODipine (NORVASC) 5 MG tablet TAKE 1 TABLET BY MOUTH  DAILY   anastrozole (ARIMIDEX) 1 MG tablet TAKE 1 TABLET BY MOUTH  DAILY DO NOT START UNTIL  COMPLETED RADIATION IN JAN  2021   atorvastatin (LIPITOR) 80 MG tablet TAKE 1 TABLET BY MOUTH  DAILY   CALCIUM-VITAMIN D PO Take 1 tablet by mouth daily.     ketoconazole (NIZORAL) 2 % shampoo Apply 1 application topically 2 (two) times a week.   Multiple Vitamins tablet Take 1 tablet by mouth daily.    omeprazole (PRILOSEC) 20 MG capsule Take 1 capsule (20 mg total) by mouth daily for 14 days.   oxybutynin (DITROPAN) 5 MG tablet TAKE 1 TABLET BY MOUTH  TWICE DAILY   predniSONE (DELTASONE) 5 MG tablet prednisone 5 mg tablet  TAKE 1 TO 2 TABLETS BY MOUTH EVERY DAY AS NEEDED FOR ARTHRITIS FLARE   Turmeric 500 MG CAPS Take 1,000 mg by mouth 2 (two) times daily.    [DISCONTINUED] prochlorperazine (COMPAZINE) 10 MG tablet Take 1 tablet (10 mg total) by mouth every 6 (six) hours as needed (Nausea or vomiting).   No facility-administered encounter medications on file as of 05/20/2021.    Allergies (verified) Patient has no known allergies.   History: Past Medical History:  Diagnosis Date   Arthritis    Breast cancer ([place])    Diverticulitis    Family history of breast cancer    GERD (gastroesophageal reflux disease)    Hyperlipidemia    Hypertension    Personal history of chemotherapy    Personal history of radiation therapy    Past Surgical History:  Procedure Laterality Date   BREAST BIOPSY Right 05/28/2018   [place] bx, INVASIVE MAMMARY CARCINOMA WITH LOBULAR FEATURES   BREAST LUMPECTOMY Right 06/12/2018   [facility], lobular features   BREAST LUMPECTOMY WITH SENTINEL LYMPH NODE BIOPSY  Right 06/12/2018   Procedure: RIGHT BREAST WIDE EXCISION WITH SENTINEL LYMPH NODE BX;  Surgeon: Robert Bellow, MD;  Location: [facility] ORS;  Service: General;  Laterality: Right;   [place] AND DRAINAGE OF WOUND Right 10/30/2018   Procedure: IRRIGATION AND DEBRIDEMENT RIGHT CHEST WALL WOUND;  Surgeon: Robert Bellow, MD;  Location: [facility] ORS;  Service: General;  Laterality: Right;   JOINT REPLACEMENT     MASTECTOMY Right    MASTECTOMY WITH AXILLARY LYMPH NODE DISSECTION Right 09/20/2018   Procedure: MASTECTOMY WITH  AXILLARY LYMPH NODE DISSECTION RIGHT;  Surgeon: Robert Bellow, MD;  Location: [facility] ORS;  Service: General;  Laterality: Right;   ovaraian cyst removal Right    Cyst only (NOT OVARY)   PORTACATH PLACEMENT Left 09/20/2018   Procedure: INSERTION PORT-A-CATH, LEFT;  Surgeon: Robert Bellow, MD;  Location: [facility] ORS;  Service: General;  Laterality: Left;   TOTAL KNEE ARTHROPLASTY Bilateral 05/05/2010   Family History  Problem Relation Age of Onset   Alcohol abuse Mother        deceased 51   Arthritis Mother    Diabetes Father    Breast cancer Maternal Aunt        dx 90s; deceased 4s   Breast cancer Maternal Aunt        dx 75s; deceased 35s   Arthritis Maternal Grandmother    Social History   Socioeconomic History   Marital status: Single    Spouse name: Not on file   Number of children: Not on file   Years of education: Not on file   Highest education level: Not on file  Occupational History   Occupation: retired  Tobacco Use   Smoking status: Never   Smokeless tobacco: Never  Vaping Use   Vaping Use: Never used  Substance and Sexual Activity   Alcohol use: No    Alcohol/week: 0.0 standard drinks   Drug use: No   Sexual activity: Never  Other Topics Concern   Not on file  Social History Narrative   Retired    Lives by herself    Pets: None   Caffeine- Coffee 2 cups daily, no tea/soda      Social Determinants of Radio broadcast assistant Strain: Low Risk    Difficulty of Paying Living Expenses: Not hard at all  Food Insecurity: No Food Insecurity   Worried About Charity fundraiser in the Last Year: Never true   Arboriculturist in the Last Year: Never true  Transportation Needs: No Transportation Needs   Lack of Transportation (Medical): No   Lack of Transportation (Non-Medical): No  Physical Activity: Not on file  Stress: No Stress Concern Present   Feeling of Stress : Not at all  Social Connections: Unknown   Frequency of Communication with Friends  and Family: More than three times a week   Frequency of Social Gatherings with Friends and Family: More than three times a week   Attends Religious Services: Not on Electrical engineer or Organizations: Not on file   Attends Archivist Meetings: Not on file   Marital Status: Not on file    Tobacco Counseling Counseling given: Not Answered   Clinical Intake:  Pre-visit preparation completed: Yes        Diabetes: No  How often do you need to have someone help you when you read instructions, pamphlets, or  other written materials from your doctor or pharmacy?: 1 - Never    Interpreter Needed?: No      Activities of Daily Living In your present state of health, do you have any difficulty performing the following activities: 05/20/2021  Hearing? N  Vision? N  Difficulty concentrating or making decisions? N  Walking or climbing stairs? Y  Comment Unsteady gait. Cane in use when walking.  Dressing or bathing? N  Doing errands, shopping? Y  Comment She does not Physiological scientist and eating ? N  Using the Toilet? N  In the past six months, have you accidently leaked urine? Y  Comment Managed with daily pad  Do you have problems with loss of bowel control? N  Managing your Medications? N  Managing your Finances? N  Housekeeping or managing your Housekeeping? N  Some recent data might be hidden    Patient Care Team: Leone Haven, MD as PCP - General (Family Medicine) Sindy Guadeloupe, MD as Consulting Physician (Oncology) Rico Junker, RN as Registered Nurse Bary Castilla, Forest Gleason, MD as Consulting Physician (General Surgery) Noreene Filbert, MD as Referring Physician (Radiation Oncology)  Indicate any recent Medical Services you may have received from other than [facility] providers in the past year (date may be approximate).     Assessment:   This is a routine wellness examination for Theresa Reid.  Virtual Visit via Telephone Note  I connected  with  Theresa Reid on 05/20/21 at 11:15 AM EST by telephone and verified that I am speaking with the correct person using two identifiers.  Persons participating in the virtual visit: patient/Nurse Health Advisor   I discussed the limitations, risks, security and privacy concerns of performing an evaluation and management service by telephone and the availability of in person appointments. The patient expressed understanding and agreed to proceed.  Interactive audio and video telecommunications were attempted between this nurse and patient, however failed, due to patient having technical difficulties OR patient did not have access to video capability.  We continued and completed visit with audio only.  Some vital signs may be absent or patient reported.   Hearing/Vision screen Hearing Screening - Comments::  Patient is able to hear conversational tones without difficulty. No issues reported. Vision Screening - Comments:: Followed by [facility]  Wears corrective lenses  They have seen their ophthalmologist in the last 12 months  Dietary issues and exercise activities discussed: Current Exercise Habits: Home exercise routine, Intensity: Mild Healthy diet  Good water intake   Goals Addressed               This Visit's Progress     Patient Stated     Maintain Healthy Lifestyle (pt-stated)        Stay active Stay healthy       Depression Screen PHQ 2/9 Scores 05/20/2021 05/19/2020 05/05/2020 11/03/2019 05/07/2019 05/02/2018 04/25/2017  PHQ - 2 Score 0 0 0 0 0 0 0    Fall Risk Fall Risk  05/20/2021 05/05/2020 11/03/2019 05/07/2019 11/05/2018  Falls in the past year? 0 0 0 0 0  Number falls in past yr: 0 0 0 - -  Injury with Fall? - - - - -  Follow up Falls evaluation completed Falls evaluation completed Falls evaluation completed Falls evaluation completed;Falls prevention discussed -    FALL RISK PREVENTION PERTAINING TO THE HOME: Home free of loose throw rugs in walkways,  pet beds, electrical cords, etc? Yes  Adequate lighting in  your home to reduce risk of falls? Yes   ASSISTIVE DEVICES UTILIZED TO PREVENT FALLS: Life alert? No  Use of a cane, walker or w/c? Yes  Grab bars in the bathroom? Yes  Shower chair or bench in shower? No  Elevated toilet seat or a handicapped toilet? Yes   TIMED UP AND GO: Was the test performed? No .   Cognitive Function: Patient is alert and oriented x3.  MMSE - Mini Mental State Exam 05/02/2018 04/25/2017 02/11/2016  Orientation to time 5 5 5   Orientation to Place 5 5 5   Registration 3 3 3   Attention/ Calculation 5 5 5   Recall 3 3 3   Language- name 2 objects 2 2 2   Language- repeat 1 1 1   Language- follow 3 step command 3 3 3   Language- read & follow direction 1 1 1   Write a sentence 1 1 1   Copy design 1 1 1   Total score 30 30 30      6CIT Screen 05/07/2019  What Year? 0 points  What month? 0 points  What time? 0 points  Count back from 20 0 points  Months in reverse 0 points  Repeat phrase 0 points  Total Score 0    Immunizations Immunization History  Administered Date(s) Administered   Fluad Quad(high Dose 65+) 01/06/2019   Influenza Split 12/28/2010   Influenza, High Dose Seasonal PF 01/22/2018   Influenza, Seasonal, Injecte, Preservative Fre 01/23/2012   Influenza,inj,Quad PF,6+ Mos 02/06/2013, 02/03/2014   Influenza,inj,quad, With Preservative 01/22/2017   Influenza-Unspecified 12/15/2014, 01/12/2016, 12/30/2016, 02/19/2020, 02/07/2021   PFIZER(Purple Top)SARS-COV-2 Vaccination 05/23/2019, 06/13/2019, 04/13/2020   Pfizer Covid-19 Vaccine Bivalent Booster 72yrs & up 02/18/2021   Pneumococcal Conjugate-13 02/11/2016, 03/01/2017   Pneumococcal Polysaccharide-23 01/04/2010, 11/08/2012   Pneumococcal-Unspecified 01/22/2017   Tdap 01/22/2017   Zoster Recombinat (Shingrix) 03/01/2017   Zoster, Live 09/22/2008   Shingrix Completed?: No.    Education has been provided regarding the importance of this  vaccine. Patient has been advised to call insurance company to determine out of pocket expense if they have not yet received this vaccine. Advised may also receive vaccine at local pharmacy or Health Dept. Verbalized acceptance and understanding.  Screening Tests Health Maintenance  Topic Date Due   Zoster Vaccines- Shingrix (2 of 2) 08/18/2021 (Originally 04/26/2017)   TETANUS/TDAP  01/23/2027   Pneumonia Vaccine 51+ Years old  Completed   INFLUENZA VACCINE  Completed   DEXA SCAN  Completed   COVID-19 Vaccine  Completed   HPV VACCINES  Aged Out   Health Maintenance There are no preventive care reminders to display for this patient.  Mammogram status: Completed 12/2020. Repeat every year  Lung Cancer Screening: (Low Dose CT Chest recommended if Age 56-80 years, 30 pack-year currently smoking OR have quit w/in 15years.) does not qualify.   Vision Screening: Recommended annual ophthalmology exams for early detection of glaucoma and other disorders of the eye.  Dental Screening: Recommended annual dental exams for proper oral hygiene.  Community Resource Referral / Chronic Care Management: CRR required this visit?  No   CCM required this visit?  No      Plan:   Keep all routine maintenance appointments.   I have personally reviewed and noted the following in the patients chart:   Medical and social history Use of alcohol, tobacco or illicit drugs  Current medications and supplements including opioid prescriptions. Not taking opioid.  Functional ability and status Nutritional status Physical activity Advanced directives List of other physicians Hospitalizations,  surgeries, and ER visits in previous 12 months Vitals Screenings to include cognitive, depression, and falls Referrals and appointments  In addition, I have reviewed and discussed with patient certain preventive protocols, quality metrics, and best practice recommendations. A written personalized care plan for  preventive services as well as general preventive health recommendations were provided to patient.     OBrien-Blaney, Adewale Pucillo L, LPN   3/60/1658      I have reviewed the above information and agree with above.   Deborra Medina, MD

## 2021-06-03 ENCOUNTER — Encounter: Payer: Self-pay | Admitting: *Deleted

## 2021-06-06 ENCOUNTER — Other Ambulatory Visit: Payer: Self-pay | Admitting: General Surgery

## 2021-06-06 DIAGNOSIS — R920 Mammographic microcalcification found on diagnostic imaging of breast: Secondary | ICD-10-CM

## 2021-06-14 ENCOUNTER — Other Ambulatory Visit: Payer: Self-pay | Admitting: *Deleted

## 2021-06-14 DIAGNOSIS — C50911 Malignant neoplasm of unspecified site of right female breast: Secondary | ICD-10-CM

## 2021-06-14 DIAGNOSIS — Z170 Estrogen receptor positive status [ER+]: Secondary | ICD-10-CM

## 2021-06-20 ENCOUNTER — Other Ambulatory Visit: Payer: Self-pay

## 2021-06-20 ENCOUNTER — Ambulatory Visit: Payer: Medicare Other | Admitting: Family Medicine

## 2021-06-20 ENCOUNTER — Inpatient Hospital Stay (HOSPITAL_BASED_OUTPATIENT_CLINIC_OR_DEPARTMENT_OTHER): Payer: Medicare Other | Admitting: Oncology

## 2021-06-20 ENCOUNTER — Inpatient Hospital Stay: Payer: Medicare Other | Attending: Oncology

## 2021-06-20 ENCOUNTER — Inpatient Hospital Stay: Payer: Medicare Other

## 2021-06-20 ENCOUNTER — Encounter: Payer: Self-pay | Admitting: Oncology

## 2021-06-20 VITALS — BP 138/72 | HR 86 | Temp 98.8°F | Resp 16 | Ht 64.0 in | Wt 191.0 lb

## 2021-06-20 DIAGNOSIS — Z9221 Personal history of antineoplastic chemotherapy: Secondary | ICD-10-CM | POA: Insufficient documentation

## 2021-06-20 DIAGNOSIS — Z7952 Long term (current) use of systemic steroids: Secondary | ICD-10-CM | POA: Diagnosis not present

## 2021-06-20 DIAGNOSIS — Z170 Estrogen receptor positive status [ER+]: Secondary | ICD-10-CM | POA: Diagnosis not present

## 2021-06-20 DIAGNOSIS — C50911 Malignant neoplasm of unspecified site of right female breast: Secondary | ICD-10-CM

## 2021-06-20 DIAGNOSIS — Z5181 Encounter for therapeutic drug level monitoring: Secondary | ICD-10-CM

## 2021-06-20 DIAGNOSIS — Z79899 Other long term (current) drug therapy: Secondary | ICD-10-CM | POA: Insufficient documentation

## 2021-06-20 DIAGNOSIS — Z7983 Long term (current) use of bisphosphonates: Secondary | ICD-10-CM | POA: Diagnosis not present

## 2021-06-20 DIAGNOSIS — Z79811 Long term (current) use of aromatase inhibitors: Secondary | ICD-10-CM | POA: Insufficient documentation

## 2021-06-20 DIAGNOSIS — Z923 Personal history of irradiation: Secondary | ICD-10-CM | POA: Insufficient documentation

## 2021-06-20 DIAGNOSIS — Z08 Encounter for follow-up examination after completed treatment for malignant neoplasm: Secondary | ICD-10-CM | POA: Diagnosis not present

## 2021-06-20 DIAGNOSIS — Z853 Personal history of malignant neoplasm of breast: Secondary | ICD-10-CM

## 2021-06-20 LAB — COMPREHENSIVE METABOLIC PANEL
ALT: 16 U/L (ref 0–44)
AST: 21 U/L (ref 15–41)
Albumin: 3.7 g/dL (ref 3.5–5.0)
Alkaline Phosphatase: 91 U/L (ref 38–126)
Anion gap: 10 (ref 5–15)
BUN: 16 mg/dL (ref 8–23)
CO2: 25 mmol/L (ref 22–32)
Calcium: 8.7 mg/dL — ABNORMAL LOW (ref 8.9–10.3)
Chloride: 105 mmol/L (ref 98–111)
Creatinine, Ser: 0.57 mg/dL (ref 0.44–1.00)
GFR, Estimated: >60 mL/min (ref >60)
Glucose, Bld: 125 mg/dL — ABNORMAL HIGH (ref 70–99)
Potassium: 3.6 mmol/L (ref 3.5–5.1)
Sodium: 140 mmol/L (ref 135–145)
Total Bilirubin: 0.2 mg/dL — ABNORMAL LOW (ref 0.3–1.2)
Total Protein: 7.2 g/dL (ref 6.5–8.1)

## 2021-06-20 LAB — CBC WITH DIFFERENTIAL/PLATELET
Abs Immature Granulocytes: 0.02 10*3/uL (ref 0.00–0.07)
Basophils Absolute: 0 10*3/uL (ref 0.0–0.1)
Basophils Relative: 0 %
Eosinophils Absolute: 0.2 10*3/uL (ref 0.0–0.5)
Eosinophils Relative: 2 %
HCT: 43.2 % (ref 36.0–46.0)
Hemoglobin: 14.1 g/dL (ref 12.0–15.0)
Immature Granulocytes: 0 %
Lymphocytes Relative: 26 %
Lymphs Abs: 2.3 10*3/uL (ref 0.7–4.0)
MCH: 29.7 pg (ref 26.0–34.0)
MCHC: 32.6 g/dL (ref 30.0–36.0)
MCV: 90.9 fL (ref 80.0–100.0)
Monocytes Absolute: 0.9 10*3/uL (ref 0.1–1.0)
Monocytes Relative: 10 %
Neutro Abs: 5.6 10*3/uL (ref 1.7–7.7)
Neutrophils Relative %: 62 %
Platelets: 218 10*3/uL (ref 150–400)
RBC: 4.75 MIL/uL (ref 3.87–5.11)
RDW: 14.2 % (ref 11.5–15.5)
WBC: 8.9 10*3/uL (ref 4.0–10.5)
nRBC: 0 % (ref 0.0–0.2)

## 2021-06-20 MED ORDER — SODIUM CHLORIDE 0.9 % IV SOLN
INTRAVENOUS | Status: DC
  Filled 2021-06-20: qty 250

## 2021-06-20 MED ORDER — ZOLEDRONIC ACID 4 MG/100ML IV SOLN
4.0000 mg | Freq: Once | INTRAVENOUS | Status: AC
  Administered 2021-06-20: 4 mg via INTRAVENOUS
  Filled 2021-06-20: qty 100

## 2021-06-20 MED ORDER — HEPARIN SOD (PORK) LOCK FLUSH 100 UNIT/ML IV SOLN
500.0000 [IU] | Freq: Once | INTRAVENOUS | Status: AC | PRN
  Administered 2021-06-20: 500 [IU]
  Filled 2021-06-20: qty 5

## 2021-06-20 NOTE — Patient Instructions (Signed)
[facility]  Discharge Instructions: °Thank you for choosing [facility] to provide your oncology and hematology care.  °If you have a lab appointment with the Cancer Center, please go directly to the Cancer Center and check in at the registration area. ° °Wear comfortable clothing and clothing appropriate for easy access to any Portacath or PICC line.  ° °We strive to give you quality time with your provider. You may need to reschedule your appointment if you arrive late (15 or more minutes).  Arriving late affects you and other patients whose appointments are after yours.  Also, if you miss three or more appointments without notifying the office, you may be dismissed from the clinic at the provider’s discretion.    °  °For prescription refill requests, have your pharmacy contact our office and allow 72 hours for refills to be completed.   ° °Today you received the following chemotherapy and/or immunotherapy agents ZOMETA    °  °To help prevent nausea and vomiting after your treatment, we encourage you to take your nausea medication as directed. ° °BELOW ARE SYMPTOMS THAT SHOULD BE REPORTED IMMEDIATELY: °*FEVER GREATER THAN 100.4 F (38 °C) OR HIGHER °*CHILLS OR SWEATING °*NAUSEA AND VOMITING THAT IS NOT CONTROLLED WITH YOUR NAUSEA MEDICATION °*UNUSUAL SHORTNESS OF BREATH °*UNUSUAL BRUISING OR BLEEDING °*URINARY PROBLEMS (pain or burning when urinating, or frequent urination) °*BOWEL PROBLEMS (unusual diarrhea, constipation, pain near the anus) °TENDERNESS IN MOUTH AND THROAT WITH OR WITHOUT PRESENCE OF ULCERS (sore throat, sores in mouth, or a toothache) °UNUSUAL RASH, SWELLING OR PAIN  °UNUSUAL VAGINAL DISCHARGE OR ITCHING  ° °Items with * indicate a potential emergency and should be followed up as soon as possible or go to the Emergency Department if any problems should occur. ° °Please show the CHEMOTHERAPY ALERT CARD or IMMUNOTHERAPY ALERT CARD at check-in to the  Emergency Department and triage nurse. ° °Should you have questions after your visit or need to cancel or reschedule your appointment, please contact [facility]  [phone] and follow the prompts.  Office hours are 8:00 a.m. to 4:30 p.m. Monday - Friday. Please note that voicemails left after 4:00 p.m. may not be returned until the following business day.  We are closed weekends and major holidays. You have access to a nurse at all times for urgent questions. Please call the main number to the clinic [phone] and follow the prompts. ° °For any non-urgent questions, you may also contact your provider using MyChart. We now offer e-Visits for anyone 18 and older to request care online for non-urgent symptoms. For details visit [email or web address]. °  °Also download the MyChart app! Go to the app store, search "MyChart", open the app, select [facility], and log in with your MyChart username and password. ° °Due to Covid, a mask is required upon entering the hospital/clinic. If you do not have a mask, one will be given to you upon arrival. For doctor visits, patients may have 1 support person aged 18 or older with them. For treatment visits, patients cannot have anyone with them due to current Covid guidelines and our immunocompromised population.  ° °Zoledronic Acid Injection (Hypercalcemia, Oncology) °What is this medication? °ZOLEDRONIC ACID (ZOE le dron ik AS id) slows calcium loss from bones. It high calcium levels in the blood from some kinds of cancer. It may be used in other people at risk for bone loss. °This medicine may be used for other purposes; ask   your health care provider or pharmacist if you have questions. °COMMON BRAND NAME(S): Zometa °What should I tell my care team before I take this medication? °They need to know if you have any of these conditions: °cancer °dehydration °dental disease °kidney disease °liver disease °low levels of calcium in the  blood °lung or breathing disease (asthma) °receiving steroids like dexamethasone or prednisone °an unusual or allergic reaction to zoledronic acid, other medicines, foods, dyes, or preservatives °pregnant or trying to get pregnant °breast-feeding °How should I use this medication? °This drug is injected into a vein. It is given by a health care provider in a hospital or clinic setting. °Talk to your health care provider about the use of this drug in children. Special care may be needed. °Overdosage: If you think you have taken too much of this medicine contact a poison control center or emergency room at once. °NOTE: This medicine is only for you. Do not share this medicine with others. °What if I miss a dose? °Keep appointments for follow-up doses. It is important not to miss your dose. Call your health care provider if you are unable to keep an appointment. °What may interact with this medication? °certain antibiotics given by injection °NSAIDs, medicines for pain and inflammation, like ibuprofen or naproxen °some diuretics like bumetanide, furosemide °teriparatide °thalidomide °This list may not describe all possible interactions. Give your health care provider a list of all the medicines, herbs, non-prescription drugs, or dietary supplements you use. Also tell them if you smoke, drink alcohol, or use illegal drugs. Some items may interact with your medicine. °What should I watch for while using this medication? °Visit your health care provider for regular checks on your progress. It may be some time before you see the benefit from this drug. °Some people who take this drug have severe bone, joint, or muscle pain. This drug may also increase your risk for jaw problems or a broken thigh bone. Tell your health care provider right away if you have severe pain in your jaw, bones, joints, or muscles. Tell you health care provider if you have any pain that does not go away or that gets worse. °Tell your dentist and  dental surgeon that you are taking this drug. You should not have major dental surgery while on this drug. See your dentist to have a dental exam and fix any dental problems before starting this drug. Take good care of your teeth while on this drug. Make sure you see your dentist for regular follow-up appointments. °You should make sure you get enough calcium and vitamin D while you are taking this drug. Discuss the foods you eat and the vitamins you take with your health care provider. °Check with your health care provider if you have severe diarrhea, nausea, and vomiting, or if you sweat a lot. The loss of too much body fluid may make it dangerous for you to take this drug. °You may need blood work done while you are taking this drug. °Do not become pregnant while taking this drug. Women should inform their health care provider if they wish to become pregnant or think they might be pregnant. There is potential for serious harm to an unborn child. Talk to your health care provider for more information. °What side effects may I notice from receiving this medication? °Side effects that you should report to your doctor or health care provider as soon as possible: °allergic reactions (skin rash, itching or hives; swelling of the face, lips, or   tongue) °bone pain °infection (fever, chills, cough, sore throat, pain or trouble passing urine) °jaw pain, especially after dental work °joint pain °kidney injury (trouble passing urine or change in the amount of urine) °low blood pressure (dizziness; feeling faint or lightheaded, falls; unusually weak or tired) °low calcium levels (fast heartbeat; muscle cramps or pain; pain, tingling, or numbness in the hands or feet; seizures) °low magnesium levels (fast, irregular heartbeat; muscle cramp or pain; muscle weakness; tremors; seizures) °low red blood cell counts (trouble breathing; feeling faint; lightheaded, falls; unusually weak or tired) °muscle pain °redness, blistering,  peeling, or loosening of the skin, including inside the mouth °severe diarrhea °swelling of the ankles, feet, hands °trouble breathing °Side effects that usually do not require medical attention (report to your doctor or health care provider if they continue or are bothersome): °anxious °constipation °coughing °depressed mood °eye irritation, itching, or pain °fever °general ill feeling or flu-like symptoms °nausea °pain, redness, or irritation at site where injected °trouble sleeping °This list may not describe all possible side effects. Call your doctor for medical advice about side effects. You may report side effects to FDA at 1-800-FDA-1088. °Where should I keep my medication? °This drug is given in a hospital or clinic. It will not be stored at home. °NOTE: This sheet is a summary. It may not cover all possible information. If you have questions about this medicine, talk to your doctor, pharmacist, or health care provider. °© 2022 [employer] Standard (2020-12-28 00:00:00) ° °

## 2021-06-21 ENCOUNTER — Encounter: Payer: Self-pay | Admitting: Oncology

## 2021-06-21 ENCOUNTER — Ambulatory Visit (INDEPENDENT_AMBULATORY_CARE_PROVIDER_SITE_OTHER): Payer: Medicare Other | Admitting: Family Medicine

## 2021-06-21 ENCOUNTER — Encounter: Payer: Self-pay | Admitting: Family Medicine

## 2021-06-21 ENCOUNTER — Other Ambulatory Visit: Payer: Self-pay

## 2021-06-21 DIAGNOSIS — I10 Essential (primary) hypertension: Secondary | ICD-10-CM

## 2021-06-21 DIAGNOSIS — K219 Gastro-esophageal reflux disease without esophagitis: Secondary | ICD-10-CM

## 2021-06-21 DIAGNOSIS — E785 Hyperlipidemia, unspecified: Secondary | ICD-10-CM

## 2021-06-21 DIAGNOSIS — M159 Polyosteoarthritis, unspecified: Secondary | ICD-10-CM

## 2021-06-21 DIAGNOSIS — R7303 Prediabetes: Secondary | ICD-10-CM

## 2021-06-21 MED ORDER — OMEPRAZOLE 20 MG PO CPDR: 20.0000 mg | DELAYED_RELEASE_CAPSULE | Freq: Every day | ORAL | 1 refills | Status: DC

## 2021-06-21 NOTE — Assessment & Plan Note (Signed)
Encouraged continued healthy diet and activity level.

## 2021-06-21 NOTE — Assessment & Plan Note (Signed)
Chronic issue.  Stable.  She will continue as needed over-the-counter Tylenol.

## 2021-06-21 NOTE — Assessment & Plan Note (Signed)
Continue Lipitor 80 mg once daily.  We will check a lipid panel at her next visit.

## 2021-06-21 NOTE — Progress Notes (Addendum)
Tommi Rumps, MD Phone: [phone]  Theresa Reid is a 81 y.o. female who presents today for f/u.  HYPERTENSION Disease Monitoring Home BP Monitoring not checking Chest pain- no    Dyspnea- no Medications Compliance-  taking amlodipine. Lightheadedness-  no  Edema- no changes to chronic edema BMET    Component Value Date/Time   NA 140 06/20/2021 1250   NA 142 07/16/2015 0000   NA 138 11/08/2012 0548   K 3.6 06/20/2021 1250   K 3.3 (L) 11/08/2012 0548   CL 105 06/20/2021 1250   CL 103 11/08/2012 0548   CO2 25 06/20/2021 1250   CO2 28 11/08/2012 0548   GLUCOSE 125 (H) 06/20/2021 1250   GLUCOSE 97 11/08/2012 0548   BUN 16 06/20/2021 1250   BUN 10 07/16/2015 0000   BUN 6 (L) 11/08/2012 0548   CREATININE 0.57 06/20/2021 1250   CREATININE 0.60 11/08/2012 0548   CALCIUM 8.7 (L) 06/20/2021 1250   CALCIUM 8.7 11/08/2012 0548   GFRNONAA >60 06/20/2021 1250   GFRNONAA >60 11/08/2012 0548   GFRAA >60 12/08/2019 1247   GFRAA >60 11/08/2012 0548   HYPERLIPIDEMIA Symptoms Chest pain on exertion:  no   Leg claudication:   no Medications: Compliance- taking lipitor Right upper quadrant pain- no  Muscle aches- no Lipid Panel     Component Value Date/Time   CHOL 176 08/09/2020 1330   CHOL 204 (H) 11/13/2014 0948   TRIG 132.0 08/09/2020 1330   HDL 65.50 08/09/2020 1330   HDL 56 11/13/2014 0948   CHOLHDL 3 08/09/2020 1330   VLDL 26.4 08/09/2020 1330   LDLCALC 84 08/09/2020 1330   LDLCALC 106 (H) 11/13/2014 0948   LDLDIRECT 90.0 07/11/2017 0948   LABVLDL 42 (H) 11/13/2014 0948   GERD:   Reflux symptoms: continues to have issues with reflux intermittently after treating with short course of omeprazole Abd pain: no   Blood in stool: no  Dysphagia: no   EGD: no  Medication: intermittently taking over the counter omeprazole  Arthritis: Patient has scattered arthritis.  She occasionally has discomfort.  She will occasionally take Tylenol for this.  Prediabetes: Patient  remains active.  Generally eats healthy with lean meats and vegetables.  She has whole grains.  She does not eat out.  She has some sweets and junk food though not much.  No soda or sweet tea.   Social History   Tobacco Use  Smoking Status Never  Smokeless Tobacco Never    Current Outpatient Medications on File Prior to Visit  Medication Sig Dispense Refill   amLODipine (NORVASC) 5 MG tablet TAKE 1 TABLET BY MOUTH  DAILY 90 tablet 3   anastrozole (ARIMIDEX) 1 MG tablet TAKE 1 TABLET BY MOUTH  DAILY DO NOT START UNTIL  COMPLETED RADIATION IN JAN  2021 90 tablet 3   atorvastatin (LIPITOR) 80 MG tablet TAKE 1 TABLET BY MOUTH  DAILY 90 tablet 3   CALCIUM-VITAMIN D PO Take 1 tablet by mouth daily.      ketoconazole (NIZORAL) 2 % shampoo Apply 1 application topically 2 (two) times a week. 120 mL 0   Multiple Vitamins tablet Take 1 tablet by mouth daily.      oxybutynin (DITROPAN) 5 MG tablet TAKE 1 TABLET BY MOUTH  TWICE DAILY 180 tablet 3   predniSONE (DELTASONE) 5 MG tablet prednisone 5 mg tablet  TAKE 1 TO 2 TABLETS BY MOUTH EVERY DAY AS NEEDED FOR ARTHRITIS FLARE 30 tablet 3   Turmeric  500 MG CAPS Take 1,000 mg by mouth 2 (two) times daily.      [DISCONTINUED] prochlorperazine (COMPAZINE) 10 MG tablet Take 1 tablet (10 mg total) by mouth every 6 (six) hours as needed (Nausea or vomiting). 30 tablet 1   No current facility-administered medications on file prior to visit.     ROS see history of present illness  Objective  Physical Exam Vitals:   06/21/21 1231  BP: 100/60  Pulse: 99  Temp: 97.9 F (36.6 C)  SpO2: 95%    BP Readings from Last 3 Encounters:  06/21/21 100/60  06/20/21 138/72  12/16/20 105/70   Wt Readings from Last 3 Encounters:  06/21/21 193 lb 3.2 oz (87.6 kg)  06/20/21 191 lb (86.6 kg)  05/20/21 180 lb (81.6 kg)    Physical Exam Constitutional:      General: She is not in acute distress.    Appearance: She is not diaphoretic.  Cardiovascular:      Rate and Rhythm: Normal rate and regular rhythm.     Heart sounds: Normal heart sounds.  Pulmonary:     Effort: Pulmonary effort is normal.     Breath sounds: Normal breath sounds.  Musculoskeletal:     Right lower leg: No edema.     Left lower leg: No edema.  Skin:    General: Skin is warm and dry.  Neurological:     Mental Status: She is alert.     Assessment/Plan: Please see individual problem list.  Problem List Items Addressed This Visit     GERD (gastroesophageal reflux disease)    She can start on omeprazole 20 mg daily.  Discussed GI referral given her age and duration of symptoms.  The patient declined this referral.  She is aware that we could be missing an ulcer or cancer without having this evaluated further.      Relevant Medications   omeprazole (PRILOSEC) 20 MG capsule   HLD (hyperlipidemia)    Continue Lipitor 80 mg once daily.  We will check a lipid panel at her next visit.      Relevant Orders   Lipid panel   Comp Met (CMET)   Hypertension    Adequately controlled.  She will start to periodically check at home.  She will continue amlodipine 5 mg daily.      Relevant Orders   Lipid panel   Comp Met (CMET)   Osteoarthritis of multiple joints    Chronic issue.  Stable.  She will continue as needed over-the-counter Tylenol.      Prediabetes    Encouraged continued healthy diet and activity level.      Relevant Orders   HgB A1c     Return in about 6 months (around 12/19/2021) for Labs 2 days prior.  This visit occurred during the SARS-CoV-2 public health emergency.  Safety protocols were in place, including screening questions prior to the visit, additional usage of staff PPE, and extensive cleaning of exam room while observing appropriate contact time as indicated for disinfecting solutions.    Tommi Rumps, MD [place]

## 2021-06-21 NOTE — Assessment & Plan Note (Addendum)
She can start on omeprazole 20 mg daily.  Discussed GI referral given her age and duration of symptoms.  The patient declined this referral.  She is aware that we could be missing an ulcer or cancer without having this evaluated further.

## 2021-06-21 NOTE — Patient Instructions (Signed)
Nice to see you. Please take the omeprazole daily. Please continue to eat a healthy diet and remain active.

## 2021-06-21 NOTE — Progress Notes (Signed)
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  Patient underwent lumpectomy and sentinel lymph node biopsy in February 2020.  Pathology showed invasive lobular carcinoma with positive medial margin which was reexcised with still positive.  Tumor was 24 mm, grade 2 ER PR positive and HER-2/neu negative.  One sentinel lymph node that was excised was positive for macro met of 8 mm.  No extranodal extension was present.  MammaPrint came back as high risk with an average 10-year risk of untreated disease is 29%.  Patient then underwent a bilateral MRI which showed additional suspicious non-mass enhancement along the inferior margin of the biopsy cavity extending 2 cm highly suspicious for continuous disease and another highly suspicious linear non-mass enhancement at the base of the nipple concerning for multifocal multicentric disease.   Patient underwent right mastectomy and axillary lymph node dissection on 09/20/2018.  There was no residual invasive mammary carcinoma within the breast.  However 15 out of 20 lymph nodes were involved with metastatic carcinoma measuring up to 6 mm and remaining 5 lymph nodes with isolated tumor cells.  Pathologic stage was [id number]   Despite having surgery in May 2020 which was a second surgery-patient has not been able to start adjuvant chemotherapy.  Her mastectomy was complicated by flap necrosis requiring debridement antibiotics and she had to be taken back to the OR for the same. Given her age and postmastectomy complications and delayed wound healing-weekly Taxol x12 cycles was chosen as the regimen for her adjuvant chemotherapy which she completed on 02/25/2019.   Patient started Arimidex in December 2020 and also received dental clearance to start Zometa.  She completed adjuvant radiation treatment    Interval history-patient wears a right compression garment for her lymphedema and overall tolerating it well.  She is taking her Arimidex along with calcium and vitamin D and tolerating it well without any significant side effects.  Appetite and weight have remained stable.  Denies any new aches and pains anywhere . ECOG PS- 1 Pain scale- 0   Review of systems- Review of Systems  Constitutional:  Positive for malaise/fatigue. Negative for chills, fever and weight loss.  HENT:  Negative for congestion, ear discharge and nosebleeds.   Eyes:  Negative for blurred vision.  Respiratory:  Negative for cough, hemoptysis, sputum production, shortness of breath and wheezing.   Cardiovascular:  Negative for chest pain, palpitations, orthopnea and claudication.  Gastrointestinal:  Negative for abdominal pain, blood in stool, constipation, diarrhea, heartburn, melena, nausea and vomiting.  Genitourinary:  Negative for  dysuria, flank pain, frequency, hematuria and urgency.  Musculoskeletal:  Negative for  back pain, joint pain and myalgias.  Skin:  Negative for rash.  Neurological:  Negative for dizziness, tingling, focal weakness, seizures, weakness and headaches.  Endo/Heme/Allergies:  Does not bruise/bleed easily.  Psychiatric/Behavioral:  Negative for depression and suicidal ideas. The patient does not have insomnia.      No Known Allergies   Past Medical History:  Diagnosis Date   Arthritis    Breast cancer ([place])    Diverticulitis    Family history of breast cancer    GERD (gastroesophageal reflux disease)    Hyperlipidemia    Hypertension    Personal history of chemotherapy    Personal history of radiation therapy      Past Surgical History:  Procedure Laterality Date   BREAST BIOPSY Right 05/28/2018   [place] bx, INVASIVE MAMMARY CARCINOMA WITH LOBULAR FEATURES   BREAST LUMPECTOMY Right 06/12/2018   [place], lobular features   BREAST LUMPECTOMY WITH SENTINEL LYMPH NODE BIOPSY Right 06/12/2018   Procedure: RIGHT BREAST WIDE EXCISION WITH SENTINEL LYMPH NODE BX;  Surgeon: Robert Bellow, MD;  Location: [facility] ORS;  Service: General;  Laterality: Right;   [place] Right 10/30/2018   Procedure: IRRIGATION AND DEBRIDEMENT RIGHT CHEST WALL WOUND;  Surgeon: Robert Bellow, MD;  Location: [facility] ORS;  Service: General;  Laterality: Right;   JOINT REPLACEMENT     MASTECTOMY Right    MASTECTOMY WITH AXILLARY LYMPH NODE DISSECTION Right 09/20/2018   Procedure: MASTECTOMY WITH AXILLARY LYMPH NODE DISSECTION RIGHT;  Surgeon: Robert Bellow, MD;  Location: [facility] ORS;  Service: General;  Laterality: Right;   ovaraian cyst removal Right    Cyst only (NOT OVARY)   PORTACATH PLACEMENT Left 09/20/2018   Procedure: INSERTION PORT-A-CATH, LEFT;  Surgeon: Robert Bellow, MD;  Location: [facility] ORS;  Service: General;  Laterality: Left;   TOTAL KNEE ARTHROPLASTY Bilateral 05/05/2010    Social History   Socioeconomic  History   Marital status: Single    Spouse name: Not on file   Number of children: Not on file   Years of education: Not on file   Highest education level: Not on file  Occupational History   Occupation: retired  Tobacco Use   Smoking status: Never   Smokeless tobacco: Never  Vaping Use   Vaping Use: Never used  Substance and Sexual Activity   Alcohol use: No    Alcohol/week: 0.0 standard drinks   Drug use: No   Sexual activity: Never  Other Topics Concern   Not on file  Social History Narrative   Retired    Lives by herself    Pets: None   Caffeine- Coffee 2 cups daily, no tea/soda      Social Determinants of Radio broadcast assistant Strain: Low Risk    Difficulty of Paying Living Expenses: Not hard at all  Food Insecurity: No Food Insecurity   Worried About Charity fundraiser in the Last Year: Never true   Arboriculturist in the Last Year: Never true  Transportation Needs: No Transportation Needs   Lack of Transportation (Medical): No   Lack of Transportation (Non-Medical): No  Physical Activity: Not on file  Stress: No Stress Concern Present   Feeling of Stress : Not at all  Social Connections: Unknown   Frequency of Communication with Friends and Family: More  than three times a week   Frequency of Social Gatherings with Friends and Family: More than three times a week   Attends Religious Services: Not on file   Active Member of Clubs or Organizations: Not on file   Attends Archivist Meetings: Not on file   Marital Status: Not on file  Intimate Partner Violence: Not At Risk   Fear of Current or Ex-Partner: No   Emotionally Abused: No   Physically Abused: No   Sexually Abused: No    Family History  Problem Relation Age of Onset   Alcohol abuse Mother        deceased 59   Arthritis Mother    Diabetes Father    Breast cancer Maternal Aunt        dx 72s; deceased 40s   Breast cancer Maternal Aunt        dx 28s; deceased 18s   Arthritis  Maternal Grandmother      Current Outpatient Medications:    amLODipine (NORVASC) 5 MG tablet, TAKE 1 TABLET BY MOUTH  DAILY, Disp: 90 tablet, Rfl: 3   anastrozole (ARIMIDEX) 1 MG tablet, TAKE 1 TABLET BY MOUTH  DAILY DO NOT START UNTIL  COMPLETED RADIATION IN JAN  2021, Disp: 90 tablet, Rfl: 3   atorvastatin (LIPITOR) 80 MG tablet, TAKE 1 TABLET BY MOUTH  DAILY, Disp: 90 tablet, Rfl: 3   CALCIUM-VITAMIN D PO, Take 1 tablet by mouth daily. , Disp: , Rfl:    ketoconazole (NIZORAL) 2 % shampoo, Apply 1 application topically 2 (two) times a week., Disp: 120 mL, Rfl: 0   Multiple Vitamins tablet, Take 1 tablet by mouth daily. , Disp: , Rfl:    omeprazole (PRILOSEC) 20 MG capsule, Take 1 capsule (20 mg total) by mouth daily for 14 days., Disp: 14 capsule, Rfl: 0   oxybutynin (DITROPAN) 5 MG tablet, TAKE 1 TABLET BY MOUTH  TWICE DAILY, Disp: 180 tablet, Rfl: 3   predniSONE (DELTASONE) 5 MG tablet, prednisone 5 mg tablet  TAKE 1 TO 2 TABLETS BY MOUTH EVERY DAY AS NEEDED FOR ARTHRITIS FLARE, Disp: 30 tablet, Rfl: 3   Turmeric 500 MG CAPS, Take 1,000 mg by mouth 2 (two) times daily. , Disp: , Rfl:   Physical exam:  Vitals:   06/20/21 1307  BP: 138/72  Pulse: 86  Resp: 16  Temp: 98.8 F (37.1 C)  TempSrc: Tympanic  SpO2: 97%  Weight: 191 lb (86.6 kg)  Height: '5\' 4"'  (1.626 m)   Physical Exam Constitutional:      General: She is not in acute distress. Cardiovascular:     Rate and Rhythm: Normal rate and regular rhythm.     Heart sounds: Normal heart sounds.  Pulmonary:     Effort: Pulmonary effort is normal.     Breath sounds: Normal breath sounds.  Abdominal:     General: Bowel sounds are normal.     Palpations: Abdomen is soft.  Skin:    General: Skin is warm and dry.  Neurological:     Mental Status: She is alert and oriented to person, place, and time.  Breast exam: Patient is s/p right mastectomy without reconstruction.  No evidence of chest wall recurrence.  No palpable right  axillary adenopathy.  No palpable masses in the left breast or left axillary adenopathy.  CMP Latest Ref Rng & Units 06/20/2021  Glucose 70 - 99 mg/dL 125(H)  BUN 8 - 23 mg/dL 16  Creatinine 0.44 - 1.00 mg/dL  0.57  Sodium 135 - 145 mmol/L 140  Potassium 3.5 - 5.1 mmol/L 3.6  Chloride 98 - 111 mmol/L 105  CO2 22 - 32 mmol/L 25  Calcium 8.9 - 10.3 mg/dL 8.7(L)  Total Protein 6.5 - 8.1 g/dL 7.2  Total Bilirubin 0.3 - 1.2 mg/dL 0.2(L)  Alkaline Phos 38 - 126 U/L 91  AST 15 - 41 U/L 21  ALT 0 - 44 U/L 16   CBC Latest Ref Rng & Units 06/20/2021  WBC 4.0 - 10.5 K/uL 8.9  Hemoglobin 12.0 - 15.0 g/dL 14.1  Hematocrit 36.0 - 46.0 % 43.2  Platelets 150 - 400 K/uL 218     Assessment and plan- Patient is a 81 y.o. female with invasive mammary carcinoma of the right breast pathological prognostic stage III A pT2pN3a cNX ER PR positive HER-2/neu negative status post right mastectomy and axillary lymph node dissection.  She completed 12 cycles of weekly Taxol chemotherapy and adjuvant radiation treatment.  This is a routine follow-up visit for breast cancer on Arimidex and to receive Zometa  Clinically patient is doing well with no concerning signs and symptoms of recurrence based on today's exam.  She is already scheduled for a left breast mammogramNext month.  Patient is tolerating Arimidex along with calcium and vitamin D well without any significant side effects.  Given high risk breast cancer patient is getting adjuvant Zometa for 3 years every 6 months and will receive her next dose today.  I will see her back in 6 months for next dose of Zometa   Visit Diagnosis 1. Encounter for follow-up surveillance of breast cancer   2. Visit for monitoring Arimidex therapy   3. Encounter for monitoring zoledronic acid therapy      Dr. Randa Evens, MD, MPH [facility] [phone] 06/21/2021 10:28 AM

## 2021-06-21 NOTE — Assessment & Plan Note (Signed)
Adequately controlled.  She will start to periodically check at home.  She will continue amlodipine 5 mg daily.

## 2021-06-23 ENCOUNTER — Other Ambulatory Visit: Payer: Self-pay

## 2021-06-23 ENCOUNTER — Ambulatory Visit
Admission: RE | Admit: 2021-06-23 | Discharge: 2021-06-23 | Disposition: A | Discharge status: Home / Self Care | Payer: Medicare Other | Source: Ambulatory Visit | Attending: General Surgery | Admitting: General Surgery

## 2021-06-23 DIAGNOSIS — R921 Mammographic calcification found on diagnostic imaging of breast: Secondary | ICD-10-CM | POA: Diagnosis not present

## 2021-06-23 DIAGNOSIS — R922 Inconclusive mammogram: Secondary | ICD-10-CM | POA: Diagnosis not present

## 2021-06-23 DIAGNOSIS — R920 Mammographic microcalcification found on diagnostic imaging of breast: Secondary | ICD-10-CM | POA: Insufficient documentation

## 2021-06-27 ENCOUNTER — Other Ambulatory Visit: Payer: Self-pay

## 2021-06-27 ENCOUNTER — Ambulatory Visit: Payer: Medicare Other | Admitting: Dermatology

## 2021-06-27 DIAGNOSIS — L820 Inflamed seborrheic keratosis: Secondary | ICD-10-CM | POA: Diagnosis not present

## 2021-06-27 DIAGNOSIS — L219 Seborrheic dermatitis, unspecified: Secondary | ICD-10-CM

## 2021-06-27 NOTE — Patient Instructions (Addendum)

## 2021-06-27 NOTE — Progress Notes (Signed)
? ?  Follow-Up Visit ?  ?Subjective  ?Theresa Reid is a 81 y.o. female who presents for the following: Skin Problem (Check growth on the right cheek ) and Rash (Rash on her scalp, worse during the summer time). ?The patient has spots, moles and lesions to be evaluated, some may be new or changing and the patient has concerns that these could be cancer. ? ?The following portions of the chart were reviewed this encounter and updated as appropriate:  ? Tobacco  Allergies  Meds  Problems  Med Hx  Surg Hx  Fam Hx   ?  ?Review of Systems:  No other skin or systemic complaints except as noted in HPI or Assessment and Plan. ? ?Objective  ?Well appearing patient in no apparent distress; mood and affect are within normal limits. ? ?A focused examination was performed including face,scalp. Relevant physical exam findings are noted in the Assessment and Plan. ? ?right cheek ?1.5 cm Stuck-on, waxy, tan-brown papule  ? ?Scalp ?Scaly mainly clear today  ? ? ?Assessment & Plan  ?Inflamed seborrheic keratosis ?right cheek ? ?Reassured benign age-related growth.  Recommend observation.  Discussed cryotherapy if spot(s) become irritated or inflamed.  ? ? ?Prior to procedure, discussed risks of blister formation, small wound, skin dyspigmentation, or rare scar following cryotherapy. Recommend Vaseline ointment to treated areas while healing.  ? ?Destruction of lesion - right cheek ?Complexity: simple   ?Destruction method: cryotherapy   ?Informed consent: discussed and consent obtained   ?Timeout:  patient name, date of birth, surgical site, and procedure verified ?Lesion destroyed using liquid nitrogen: Yes   ?Region frozen until ice ball extended beyond lesion: Yes   ?Outcome: patient tolerated procedure well with no complications   ?Post-procedure details: wound care instructions given   ? ?Seborrheic dermatitis ?Scalp ? ?Seborrheic Dermatitis  ?-  is a chronic persistent rash characterized by pinkness and scaling most  commonly of the mid face but also can occur on the scalp (dandruff), ears; mid chest, mid back and groin.  It tends to be exacerbated by stress and cooler weather.  People who have neurologic disease may experience new onset or exacerbation of existing seborrheic dermatitis.  The condition is not curable but treatable and can be controlled.  ? ?Start Ketoconazole shampoo once a week ?Start  Head and shoulders shampoo once a week ?Start any shampoo pt would like once a week  ? ?Related Medications ?ketoconazole (NIZORAL) 2 % shampoo ?Apply 1 application topically 2 (two) times a week. ? ? ?Return in about 2 months (around 08/27/2021) for ISK . ? ?I, Marye Round, CMA, am acting as scribe for Sarina Ser, MD .  ?Documentation: I have reviewed the above documentation for accuracy and completeness, and I agree with the above. ? ?Sarina Ser, MD ? ?

## 2021-06-28 ENCOUNTER — Encounter: Payer: Self-pay | Admitting: Dermatology

## 2021-07-05 DIAGNOSIS — Z853 Personal history of malignant neoplasm of breast: Secondary | ICD-10-CM | POA: Diagnosis not present

## 2021-07-11 ENCOUNTER — Ambulatory Visit: Payer: Medicare Other | Attending: Oncology | Admitting: Occupational Therapy

## 2021-07-11 ENCOUNTER — Other Ambulatory Visit: Payer: Self-pay

## 2021-07-11 DIAGNOSIS — I972 Postmastectomy lymphedema syndrome: Secondary | ICD-10-CM | POA: Diagnosis not present

## 2021-07-11 NOTE — Therapy (Signed)
?OUTPATIENT OCCUPATIONAL THERAPY TREATMENT NOTE/REEVAL ? ? ?Patient Name: Theresa Reid ?MRN: [id number] ?DOB:Jul 05, 1940, 81 y.o., female ?Today's Date: 07/11/2021 ? ?PCP: Leone Haven, MD ?REFERRING PROVIDER: Sindy Guadeloupe, MD/[place], Forest Gleason, MD ? ? OT End of Session - 07/11/21 1500   ? ? Visit Number 16   ? Number of Visits 17   ? Date for OT Re-Evaluation 12/12/21   ? OT Start Time 1432   ? OT Stop Time 1500   ? OT Time Calculation (min) 28 min   ? Activity Tolerance Patient tolerated treatment well   ? Behavior During Therapy [facility] for tasks assessed/performed   ? ?  ?  ? ?  ? ? ?Past Medical History:  ?Diagnosis Date  ? Arthritis   ? Breast cancer ([place])   ? Diverticulitis   ? Family history of breast cancer   ? GERD (gastroesophageal reflux disease)   ? Hyperlipidemia   ? Hypertension   ? Personal history of chemotherapy   ? Personal history of radiation therapy   ? ?Past Surgical History:  ?Procedure Laterality Date  ? BREAST BIOPSY Right 05/28/2018  ? [place] bx, INVASIVE MAMMARY CARCINOMA WITH LOBULAR FEATURES  ? BREAST LUMPECTOMY Right 06/12/2018  ? [facility], lobular features  ? BREAST LUMPECTOMY WITH SENTINEL LYMPH NODE BIOPSY Right 06/12/2018  ? Procedure: RIGHT BREAST WIDE EXCISION WITH SENTINEL LYMPH NODE BX;  Surgeon: Robert Bellow, MD;  Location: [facility] OR[facility];  Service: General;  Laterality: Right;  ? CHOLECYSTECTOMY    ? GALLBLADDER SURGERY  1997  ? INCISION AND DRAINAGE OF WOUND Right 10/30/2018  ? Procedure: IRRIGATION AND DEBRIDEMENT RIGHT CHEST WALL WOUND;  Surgeon: Robert Bellow, MD;  Location: [facility] ORS;  Service: General;  Laterality: Right;  ? JOINT REPLACEMENT    ? MASTECTOMY Right   ? MASTECTOMY WITH AXILLARY LYMPH NODE DISSECTION Right 09/20/2018  ? Procedure: MASTECTOMY WITH AXILLARY LYMPH NODE DISSECTION RIGHT;  Surgeon: Robert Bellow, MD;  Location: [facility] OR[facility];  Service: General;  Laterality: Right;  ? ovaraian cyst removal Right   ? Cyst only (NOT OVARY)  ? PORTACATH PLACEMENT Left  09/20/2018  ? Procedure: INSERTION PORT-A-CATH, LEFT;  Surgeon: Robert Bellow, MD;  Location: [facility] ORS;  Service: General;  Laterality: Left;  ? TOTAL KNEE ARTHROPLASTY Bilateral 05/05/2010  ? ?Patient Active Problem List  ? Diagnosis Date Noted  ? Prediabetes 12/16/2020  ? 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  ? Cataract 09/24/2018  ? Urge incontinence of urine 09/24/2018  ? Malignant neoplasm of right female breast ([place]) 06/07/2018  ? Family history of breast cancer   ? Dairy product intolerance 01/14/2018  ? Seborrheic keratosis 04/10/2017  ? Dysuria 02/19/2017  ? Visual disturbance 10/03/2016  ? GERD (gastroesophageal reflux disease) 09/14/2016  ? HLD (hyperlipidemia) 08/13/2015  ? Seborrheic dermatitis 07/01/2015  ? Hypertension 11/13/2014  ? 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  ? ? ?ONSET DATE: 09/19/2020 ? ?REFERRING DIAG: Postmastectomy Lymphedema R UE and thoracic ? ?THERAPY DIAG:  ?Postmastectomy lymphedema syndrome ? ? ?PERTINENT HISTORY: Patient had her original lumpectomy and sentinel lymph node biopsy back in February 2020 R breast   She then had an MRI which showed more areas of enhancement and was taken back to the OR for mastectomy and axillary lymph node dissection on 09/20/2018 which showed 15 of the lymph nodes were also positive for malignancy and this upstaged her to stage IIIa disease.  Plan was to offer her adjuvant chemotherapy following this.  However her mastectomy  course was complicated by flap necrosis requiring debridement antibiotics and she had to be also taken to the OR on 10/30/2018.  Her wound has not completely healed yet and she still has a drain in place.  She seen  Dr. Dahlia Byes on 11/18/2018.  There is concern for early cellulitis of her chest wall.  Dr. Dahlia Byes after his visit started  patient on Augmentin for 2 more weeks which further delays her chemotherapy. Lymphedema in R UE per pt developed after surgery on 10/30/2018 - stayed the same - not going down - denies  pain - pt comes every about 5-7 months for re assessent and getting replacement for her daytime compression sleeve  ? ?PRECAUTIONS: R UE lymphedema  ? ?SUBJECTIVE: Just seen Dr. Bary Castilla, released me I am only seeing Dr. Janese Banks..  Following up with you for my right arm circumference.  My compression sleeve still okay since December.  Working well ? ? ? ? ?OBJECTIVE:  ? ?TODAY'S TREATMENT:  ?07/11/21 ?Patient arrives again today with right Jobst Elvarex compression sleeve and glove.  Wearing it daily reported doing well and able to maintain her circumference in the right upper extremity. ?Lymphedema measurements within normal limits compared to her left upper extremity ?Patient was just fitted with a new compression sleeve in December 2022 and glove ?Patient to follow-up with me again in 4 to 5 months  ? ?PATIENT EDUCATION: ?Circumference for lymphedema maintained continue compression ?Patient replaced garment again possibly in 5 months ? ? LYMPHEDEMA/ONCOLOGY QUESTIONNAIRE - 07/11/21 0001   ? ?  ? Right Upper Extremity Lymphedema  ? 15 cm Proximal to Olecranon Process 37.5 cm   ? 10 cm Proximal to Olecranon Process 37 cm   ? Olecranon Process 25 cm   ? 15 cm Proximal to Ulnar Styloid Process 22 cm   ? 10 cm Proximal to Ulnar Styloid Process 19 cm   ? Just Proximal to Ulnar Styloid Process 14.5 cm   ?  ? Left Upper Extremity Lymphedema  ? 15 cm Proximal to Olecranon Process 38 cm   ? 10 cm Proximal to Olecranon Process 37.5 cm   ? Olecranon Process 26 cm   ? 15 cm Proximal to Ulnar Styloid Process 21.5 cm   ? 10 cm Proximal to Ulnar Styloid Process 19 cm   ? Just Proximal to Ulnar Styloid Process 15 cm   ? ?  ?  ? ?  ?   ? ? OT Short Term Goals   ? ?  ? OT SHORT TERM GOAL #1  ? Title Pt's R UE circumference decrease by at least 1cm -2 cm from hand to upper arm - to be fitted for appropriate compression garments   ? Status Achieved   ? ?  ?  ? ?  ? ?OT Long Term Goals  ? ?  ?  ? OT LONG TERM GOAL #2  ? Title Pt maintain  her R UE and thoracic lymphedema  circumference with correct compression garments and HEP   ? Baseline She is wearing jovipak breastpad but per pt not as she should -and her daytime sleeve and glove older than 7 months - need new   ? Time 5  ? Period Months  ? Status Ongoing   ? Target Date 12/12/21  ? ?  ?  ? ?  ? ? ? Plan - 07/11/21 1502   ? ? Clinical Impression Statement Pt with diagnosis of R UE lymphedema - she done great thru the years  to maintain her R UE circumference with Jobst Elvarex sleeve and glove and jovipak breast pad as needed- was fitted in Dec 22 last time -and done great with maintaining her circumference in R UE with only custom daytime compression sleeve and glove. Reinforce again with pt to use her Jovipak breastpad  as needed night time or day time to  clear thoracic lymphedema -  Review with her to use sports bra and stitch jovipak in there to increase ease. Pt to contact me  in 4-5 months to measure again and assess if need replacement sleeve.    ? OT Occupational Profile and History Problem Focused Assessment - Including review of records relating to presenting problem   ? Occupational performance deficits (Please refer to evaluation for details): ADL's;Play;Leisure;IADL's   ? Body Structure / Function / Physical Skills ADL;UE functional use;Scar mobility;Skin integrity;Edema   ? Rehab Potential Good   ? Clinical Decision Making Several treatment options, min-mod task modification necessary   ? Comorbidities Affecting Occupational Performance: May have comorbidities impacting occupational performance   ? Modification or Assistance to Complete Evaluation  No modification of tasks or assist necessary to complete eval   ? OT Frequency Monthly   ? OT Duration 8 weeks   ? OT Treatment/Interventions Self-care/ADL training;Manual lymph drainage;Therapeutic exercise;Patient/family education;Manual Therapy;Compression bandaging   ? OT Home Exercise Plan see pt instruction   ? Consulted and Agree  with Plan of Care Patient   ? ?  ?  ? ?  ? ? ? ?Rosalyn Gess, OTR/L,CLT ?07/11/2021, 3:04 PM ? ?  ? ?  ?

## 2021-08-04 ENCOUNTER — Other Ambulatory Visit: Payer: Self-pay | Admitting: Family Medicine

## 2021-08-04 ENCOUNTER — Other Ambulatory Visit: Payer: Self-pay | Admitting: Oncology

## 2021-08-04 DIAGNOSIS — N3941 Urge incontinence: Secondary | ICD-10-CM

## 2021-08-04 DIAGNOSIS — I10 Essential (primary) hypertension: Secondary | ICD-10-CM

## 2021-08-11 ENCOUNTER — Other Ambulatory Visit: Payer: Self-pay | Admitting: Family Medicine

## 2021-08-21 NOTE — Progress Notes (Deleted)
Office Visit Note  Patient: Theresa Reid             Date of Birth: [birth year]           MRN: [id number]             PCP: Leone Haven, MD Referring: Leone Haven, MD Visit Date: 08/22/2021   Subjective:  No chief complaint on file.   History of Present Illness: Theresa Reid is a 81 y.o. female here for follow up for OA also questionable PMR activity with prednisone 5-10 mg daily PRN.***   Previous HPI 10/01/20 Theresa Reid is a 81 y.o. female here for evaluation for PMR previously diagnosed 2016 and has been taking prednisone 5 mg PO daily dose and seeing [facility] rheumatology Hoyle Sauer for this now transitioning care due to leaving the practice.  Currently she experiences a chronic mild to moderate level of joint pain in multiple sites with osteoarthritis.  She experiences flareup of joint pain symptoms which she treats with prednisone 5-10 milligrams daily as needed which she estimates is maybe only a few days out of each month.  Her most problematic area is in the right shoulder.  She does have history of right breast cancer treatment of which has some residual right arm lymphedema.  Currently today she is feeling well.   No Rheumatology ROS completed.   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[place]) 06/07/2018   Family history of breast cancer    Dairy product intolerance 01/14/2018   Seborrheic keratosis 04/10/2017   Dysuria 02/19/2017   Visual disturbance 10/03/2016   GERD (gastroesophageal reflux disease) 09/14/2016   HLD (hyperlipidemia) 08/13/2015   Seborrheic dermatitis 07/01/2015   Hypertension 11/13/2014   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    Past Medical History:  Diagnosis Date   Arthritis    Breast cancer ([place])     Diverticulitis    Family history of breast cancer    GERD (gastroesophageal reflux disease)    Hyperlipidemia    Hypertension    Personal history of chemotherapy    Personal history of radiation therapy     Family History  Problem Relation Age of Onset   Alcohol abuse Mother        deceased 13   Arthritis Mother    Diabetes Father    Breast cancer Maternal Aunt        dx 20s; deceased 26s   Breast cancer Maternal Aunt        dx 66s; deceased 72s   Arthritis Maternal Grandmother    Past Surgical History:  Procedure Laterality Date   BREAST BIOPSY Right 05/28/2018   [place] bx, INVASIVE MAMMARY CARCINOMA WITH LOBULAR FEATURES   BREAST LUMPECTOMY Right 06/12/2018   [place], lobular features   BREAST LUMPECTOMY WITH SENTINEL LYMPH NODE BIOPSY Right 06/12/2018   Procedure: RIGHT BREAST WIDE EXCISION WITH SENTINEL LYMPH NODE BX;  Surgeon: Robert Bellow, MD;  Location: [facility]S;  Service: General;  Laterality: Right;   [place] Right 10/30/2018   Procedure: IRRIGATION AND DEBRIDEMENT RIGHT CHEST WALL WOUND;  Surgeon: Robert Bellow, MD;  Location: [facility];  Service: General;  Laterality: Right;  JOINT REPLACEMENT     MASTECTOMY Right    MASTECTOMY WITH AXILLARY LYMPH NODE DISSECTION Right 09/20/2018   Procedure: MASTECTOMY WITH AXILLARY LYMPH NODE DISSECTION RIGHT;  Surgeon: Robert Bellow, MD;  Location: [facility];  Service: General;  Laterality: Right;   ovaraian cyst removal Right    Cyst only (NOT OVARY)   PORTACATH PLACEMENT Left 09/20/2018   Procedure: INSERTION PORT-A-CATH, LEFT;  Surgeon: Robert Bellow, MD;  Location: [facility];  Service: General;  Laterality: Left;   TOTAL KNEE ARTHROPLASTY Bilateral 05/05/2010   Social History   Social History Narrative   Retired    Lives by herself    Pets: None   Caffeine- Coffee 2 cups daily, no tea/soda      Immunization History  Administered Date(s)  Administered   Fluad Quad(high Dose 65+) 01/06/2019   Influenza Split 12/28/2010   Influenza, High Dose Seasonal PF 01/22/2018   Influenza, Seasonal, Injecte, Preservative Fre 01/23/2012   Influenza,inj,Quad PF,6+ Mos 02/06/2013, 02/03/2014   Influenza,inj,quad, With Preservative 01/22/2017   Influenza-Unspecified 12/15/2014, 01/12/2016, 12/30/2016, 02/19/2020, 02/07/2021   PFIZER(Purple Top)SARS-COV-2 Vaccination 05/23/2019, 06/13/2019, 04/13/2020   Pfizer Covid-19 Vaccine Bivalent Booster 4yr & up 02/18/2021   Pneumococcal Conjugate-13 02/11/2016, 03/01/2017   Pneumococcal Polysaccharide-23 01/04/2010, 11/08/2012   Pneumococcal-Unspecified 01/22/2017   Tdap 01/22/2017   Zoster Recombinat (Shingrix) 03/01/2017   Zoster, Live 09/22/2008     Objective: Vital Signs: There were no vitals taken for this visit.   Physical Exam   Musculoskeletal Exam: ***  CDAI Exam: CDAI Score: -- Patient Global: --; Provider Global: -- Swollen: --; Tender: -- Joint Exam 08/22/2021   No joint exam has been documented for this visit   There is currently no information documented on the homunculus. Go to the Rheumatology activity and complete the homunculus joint exam.  Investigation: No additional findings.  Imaging: No results found.  Recent Labs: Lab Results  Component Value Date   WBC 8.9 06/20/2021   HGB 14.1 06/20/2021   PLT 218 06/20/2021   NA 140 06/20/2021   K 3.6 06/20/2021   CL 105 06/20/2021   CO2 25 06/20/2021   GLUCOSE 125 (H) 06/20/2021   BUN 16 06/20/2021   CREATININE 0.57 06/20/2021   BILITOT 0.2 (L) 06/20/2021   ALKPHOS 91 06/20/2021   AST 21 06/20/2021   ALT 16 06/20/2021   PROT 7.2 06/20/2021   ALBUMIN 3.7 06/20/2021   CALCIUM 8.7 (L) 06/20/2021   GFRAA >60 12/08/2019    Speciality Comments: No specialty comments available.  Procedures:  No procedures performed Allergies: Patient has no known allergies.   Assessment / Plan:     Visit Diagnoses: No  diagnosis found.  ***  Orders: No orders of the defined types were placed in this encounter.  No orders of the defined types were placed in this encounter.    Follow-Up Instructions: No follow-ups on file.   CCollier Salina MD  Note - This record has been created using [employer]  Chart creation errors have been sought, but may not always  have been located. Such creation errors do not reflect on  the standard of medical care.

## 2021-08-22 ENCOUNTER — Ambulatory Visit: Payer: Medicare Other | Admitting: Internal Medicine

## 2021-08-29 ENCOUNTER — Other Ambulatory Visit: Payer: Self-pay | Admitting: Family Medicine

## 2021-08-29 DIAGNOSIS — K219 Gastro-esophageal reflux disease without esophagitis: Secondary | ICD-10-CM

## 2021-08-29 NOTE — Progress Notes (Deleted)
Office Visit Note  Patient: Theresa Reid             Date of Birth: 05/23/1940           MRN: [id number]             PCP: Leone Haven, MD Referring: Leone Haven, MD Visit Date: 09/07/2021   Subjective:  No chief complaint on file.   History of Present Illness: Theresa Reid is a 81 y.o. female here for follow up for PMR previously diagnosed 2016 and has been taking prednisone 5 mg PO daily dose and seeing [facility] rheumatology Hoyle Sauer for this now transitioning care due to leaving the practice.    Previous HPI 10/01/2020 Theresa Reid is a 81 y.o. female here for evaluation for PMR previously diagnosed 2016 and has been taking prednisone 5 mg PO daily dose and seeing [facility] rheumatology Hoyle Sauer for this now transitioning care due to leaving the practice.  Currently she experiences a chronic mild to moderate level of joint pain in multiple sites with osteoarthritis.  She experiences flareup of joint pain symptoms which she treats with prednisone 5-10 milligrams daily as needed which she estimates is maybe only a few days out of each month.  Her most problematic area is in the right shoulder.  She does have history of right breast cancer treatment of which has some residual right arm lymphedema.  Currently today she is feeling well.   No Rheumatology ROS completed.   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[place]) 06/07/2018   Family history of breast cancer    Dairy product intolerance 01/14/2018   Seborrheic keratosis 04/10/2017   Dysuria 02/19/2017   Visual disturbance 10/03/2016   GERD (gastroesophageal reflux disease) 09/14/2016   HLD (hyperlipidemia) 08/13/2015   Seborrheic dermatitis 07/01/2015   Hypertension 11/13/2014    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    Past Medical History:  Diagnosis Date   Arthritis    Breast cancer ([place])    Diverticulitis    Family history of breast cancer    GERD (gastroesophageal reflux disease)    Hyperlipidemia    Hypertension    Personal history of chemotherapy    Personal history of radiation therapy     Family History  Problem Relation Age of Onset   Alcohol abuse Mother        deceased 59   Arthritis Mother    Diabetes Father    Breast cancer Maternal Aunt        dx 60s; deceased 78s   Breast cancer Maternal Aunt        dx 36s; deceased 16s   Arthritis Maternal Grandmother    Past Surgical History:  Procedure Laterality Date   BREAST BIOPSY Right 05/28/2018   [place] bx, INVASIVE MAMMARY CARCINOMA WITH LOBULAR FEATURES   BREAST LUMPECTOMY Right 06/12/2018   [place], lobular features   BREAST LUMPECTOMY WITH SENTINEL LYMPH NODE BIOPSY Right 06/12/2018   Procedure: RIGHT BREAST WIDE EXCISION WITH SENTINEL LYMPH NODE BX;  Surgeon: Robert Bellow, MD;  Location: [facility];  Service: General;  Laterality: Right;   [place] OF WOUND Right 10/30/2018   Procedure: IRRIGATION AND DEBRIDEMENT RIGHT CHEST  WALL WOUND;  Surgeon: Robert Bellow, MD;  Location: [facility];  Service: General;  Laterality: Right;   JOINT REPLACEMENT     MASTECTOMY Right    MASTECTOMY WITH AXILLARY LYMPH NODE DISSECTION Right 09/20/2018   Procedure: MASTECTOMY WITH AXILLARY LYMPH NODE DISSECTION RIGHT;  Surgeon: Robert Bellow, MD;  Location: [facility];  Service: General;  Laterality: Right;   ovaraian cyst removal Right    Cyst only (NOT OVARY)   PORTACATH PLACEMENT Left 09/20/2018   Procedure: INSERTION PORT-A-CATH, LEFT;  Surgeon: Robert Bellow, MD;  Location: [facility];  Service: General;  Laterality: Left;   TOTAL KNEE ARTHROPLASTY Bilateral 05/05/2010   Social History   Social History Narrative   Retired     Lives by herself    Pets: None   Caffeine- Coffee 2 cups daily, no tea/soda      Immunization History  Administered Date(s) Administered   Fluad Quad(high Dose 65+) 01/06/2019   Influenza Split 12/28/2010   Influenza, High Dose Seasonal PF 01/22/2018   Influenza, Seasonal, Injecte, Preservative Fre 01/23/2012   Influenza,inj,Quad PF,6+ Mos 02/06/2013, 02/03/2014   Influenza,inj,quad, With Preservative 01/22/2017   Influenza-Unspecified 12/15/2014, 01/12/2016, 12/30/2016, 02/19/2020, 02/07/2021   PFIZER(Purple Top)SARS-COV-2 Vaccination 05/23/2019, 06/13/2019, 04/13/2020   Pfizer Covid-19 Vaccine Bivalent Booster 64yr & up 02/18/2021   Pneumococcal Conjugate-13 02/11/2016, 03/01/2017   Pneumococcal Polysaccharide-23 01/04/2010, 11/08/2012   Pneumococcal-Unspecified 01/22/2017   Tdap 01/22/2017   Zoster Recombinat (Shingrix) 03/01/2017   Zoster, Live 09/22/2008     Objective: Vital Signs: There were no vitals taken for this visit.   Physical Exam   Musculoskeletal Exam: ***  CDAI Exam: CDAI Score: -- Patient Global: --; Provider Global: -- Swollen: --; Tender: -- Joint Exam 09/07/2021   No joint exam has been documented for this visit   There is currently no information documented on the homunculus. Go to the Rheumatology activity and complete the homunculus joint exam.  Investigation: No additional findings.  Imaging: No results found.  Recent Labs: Lab Results  Component Value Date   WBC 8.9 06/20/2021   HGB 14.1 06/20/2021   PLT 218 06/20/2021   NA 140 06/20/2021   K 3.6 06/20/2021   CL 105 06/20/2021   CO2 25 06/20/2021   GLUCOSE 125 (H) 06/20/2021   BUN 16 06/20/2021   CREATININE 0.57 06/20/2021   BILITOT 0.2 (L) 06/20/2021   ALKPHOS 91 06/20/2021   AST 21 06/20/2021   ALT 16 06/20/2021   PROT 7.2 06/20/2021   ALBUMIN 3.7 06/20/2021   CALCIUM 8.7 (L) 06/20/2021   GFRAA >60 12/08/2019    Speciality Comments: No specialty comments  available.  Procedures:  No procedures performed Allergies: Patient has no known allergies.   Assessment / Plan:     Visit Diagnoses: No diagnosis found.  ***  Orders: No orders of the defined types were placed in this encounter.  No orders of the defined types were placed in this encounter.    Follow-Up Instructions: No follow-ups on file.   MEarnestine Mealing CMA  Note - This record has been created using [employer], commissioning  Chart creation errors have been sought, but may not always  have been located. Such creation errors do not reflect on  the standard of medical care.

## 2021-09-07 ENCOUNTER — Ambulatory Visit: Payer: Medicare Other | Admitting: Internal Medicine

## 2021-09-07 DIAGNOSIS — C50911 Malignant neoplasm of unspecified site of right female breast: Secondary | ICD-10-CM

## 2021-09-07 DIAGNOSIS — Z8739 Personal history of other diseases of the musculoskeletal system and connective tissue: Secondary | ICD-10-CM

## 2021-09-07 DIAGNOSIS — M159 Polyosteoarthritis, unspecified: Secondary | ICD-10-CM

## 2021-09-14 ENCOUNTER — Ambulatory Visit: Payer: Medicare Other | Admitting: Dermatology

## 2021-09-14 DIAGNOSIS — L820 Inflamed seborrheic keratosis: Secondary | ICD-10-CM | POA: Diagnosis not present

## 2021-09-14 DIAGNOSIS — L219 Seborrheic dermatitis, unspecified: Secondary | ICD-10-CM | POA: Diagnosis not present

## 2021-09-14 MED ORDER — KETOCONAZOLE 2 % EX SHAM: 1.0000 "application " | MEDICATED_SHAMPOO | CUTANEOUS | 6 refills | Status: DC

## 2021-09-14 NOTE — Progress Notes (Signed)
   Follow-Up Visit   Subjective  Theresa Reid is a 81 y.o. female who presents for the following: Follow-up (2 months f/u recheck ISK on the right cheek treated with LN2). Recheck rash on scalp treating with Ketoconazole shampoo alternating with otc Head and shoulders shampoo with a good response.  The following portions of the chart were reviewed this encounter and updated as appropriate:   Tobacco  Allergies  Meds  Problems  Med Hx  Surg Hx  Fam Hx     Review of Systems:  No other skin or systemic complaints except as noted in HPI or Assessment and Plan.  Objective  Well appearing patient in no apparent distress; mood and affect are within normal limits.  A focused examination was performed including face. Relevant physical exam findings are noted in the Assessment and Plan.  right cheek 1.2 cm Stuck-on, waxy, tan-brown papule   Scalp Clear skin    Assessment & Plan  Inflamed seborrheic keratosis right cheek ISK improved from LN2 treatment but significant persistence and thickness remains  May take several treatments to make go completely away. Symptomatic, irritating, patient would like treated.  Destruction of lesion - right cheek Complexity: simple   Destruction method: cryotherapy   Informed consent: discussed and consent obtained   Timeout:  patient name, date of birth, surgical site, and procedure verified Lesion destroyed using liquid nitrogen: Yes   Region frozen until ice ball extended beyond lesion: Yes   Outcome: patient tolerated procedure well with no complications   Post-procedure details: wound care instructions given    Seborrheic dermatitis Scalp Seborrheic Dermatitis improved  -  is a chronic persistent rash characterized by pinkness and scaling most commonly of the mid face but also can occur on the scalp (dandruff), ears; mid chest, mid back and groin.  It tends to be exacerbated by stress and cooler weather.  People who have neurologic  disease may experience new onset or exacerbation of existing seborrheic dermatitis.  The condition is not curable but treatable and can be controlled.  Chronic and persistent condition with duration or expected duration over one year. Condition is symptomatic / bothersome to patient. Not to goal.  Cont Ketoconazole 2% shampoo alternating with otc Head and shoulders shampoo   Related Medications ketoconazole (NIZORAL) 2 % shampoo Apply 1 application. topically 2 (two) times a week.  Return in about 2 months (around 11/14/2021) for ISK .  I, Marye Round, CMA, am acting as scribe for Sarina Ser, MD .  Documentation: I have reviewed the above documentation for accuracy and completeness, and I agree with the above.  Sarina Ser, MD

## 2021-09-14 NOTE — Patient Instructions (Addendum)

## 2021-09-16 ENCOUNTER — Inpatient Hospital Stay: Payer: Medicare Other | Attending: Oncology

## 2021-09-16 DIAGNOSIS — Z95828 Presence of other vascular implants and grafts: Secondary | ICD-10-CM

## 2021-09-16 DIAGNOSIS — C50911 Malignant neoplasm of unspecified site of right female breast: Secondary | ICD-10-CM | POA: Diagnosis not present

## 2021-09-16 DIAGNOSIS — Z452 Encounter for adjustment and management of vascular access device: Secondary | ICD-10-CM | POA: Diagnosis not present

## 2021-09-16 MED ORDER — SODIUM CHLORIDE 0.9% FLUSH
10.0000 mL | Freq: Once | INTRAVENOUS | Status: AC
  Administered 2021-09-16: 10 mL via INTRAVENOUS
  Filled 2021-09-16: qty 10

## 2021-09-16 MED ORDER — HEPARIN SOD (PORK) LOCK FLUSH 100 UNIT/ML IV SOLN
500.0000 [IU] | Freq: Once | INTRAVENOUS | Status: AC
  Administered 2021-09-16: 500 [IU] via INTRAVENOUS
  Filled 2021-09-16: qty 5

## 2021-09-19 ENCOUNTER — Encounter: Payer: Self-pay | Admitting: Dermatology

## 2021-09-20 NOTE — Progress Notes (Signed)
Office Visit Note  Patient: Theresa Reid             Date of Birth: May 17, 1940           MRN: [id number]             PCP: Leone Haven, MD Referring: Leone Haven, MD Visit Date: 09/27/2021   Subjective:  Follow-up (Doing good)   History of Present Illness: Theresa Reid is a 81 y.o. female here for follow up for PMR and generalized osteoarthritis. Since our visit last year she has infrequent exacerbations of symptoms less than monthly which quickly improve on prednisone 5 mg. Each such incident is usually less than 1 week. Besides this symptoms are overall stable. She also reports some right shoulder pain when lying in bed to sleep on that side. She has some increased stiffness and difficulty reaching far above and behind.  Previous HPI 10/01/2020 Theresa Reid is a 81 y.o. female here for evaluation for PMR previously diagnosed 2016 and has been taking prednisone 5 mg PO daily dose and seeing [facility] rheumatology Hoyle Sauer for this now transitioning care due to leaving the practice.  Currently she experiences a chronic mild to moderate level of joint pain in multiple sites with osteoarthritis.  She experiences flareup of joint pain symptoms which she treats with prednisone 5-10 milligrams daily as needed which she estimates is maybe only a few days out of each month.  Her most problematic area is in the right shoulder.  She does have history of right breast cancer treatment of which has some residual right arm lymphedema.  Currently today she is feeling well.   Review of Systems  Constitutional:  Negative for fatigue.  HENT:  Negative for mouth dryness.   Eyes:  Negative for dryness.  Respiratory:  Negative for shortness of breath.   Cardiovascular:  Negative for swelling in legs/feet.  Gastrointestinal:  Negative for constipation.  Endocrine: Negative for excessive thirst.  Genitourinary:  Negative for difficulty urinating.  Musculoskeletal:  Positive for  joint pain, gait problem, joint pain, joint swelling and morning stiffness.  Skin:  Negative for rash.  Allergic/Immunologic: Negative for susceptible to infections.  Neurological:  Positive for numbness.  Hematological:  Negative for bruising/bleeding tendency.  Psychiatric/Behavioral:  Negative for sleep disturbance.     PMFS History:  Patient Active Problem List   Diagnosis Date Noted   Pain in right shoulder 09/27/2021   Prediabetes 12/16/2020   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   Cataract 09/24/2018   Urge incontinence of urine 09/24/2018   Malignant neoplasm of right female breast ([place]) 06/07/2018   Family history of breast cancer    Dairy product intolerance 01/14/2018   Seborrheic keratosis 04/10/2017   Dysuria 02/19/2017   Visual disturbance 10/03/2016   GERD (gastroesophageal reflux disease) 09/14/2016   HLD (hyperlipidemia) 08/13/2015   Seborrheic dermatitis 07/01/2015   Hypertension 11/13/2014   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    Past Medical History:  Diagnosis Date   Arthritis    Breast cancer ([place])    Diverticulitis    Family history of breast cancer    GERD (gastroesophageal reflux disease)    Hyperlipidemia    Hypertension    Personal history of chemotherapy    Personal history of radiation therapy     Family History  Problem Relation Age of Onset   Alcohol abuse Mother        deceased 65   Arthritis Mother  Diabetes Father    Breast cancer Maternal Aunt        dx 52s; deceased 64s   Breast cancer Maternal Aunt        dx 54s; deceased 76s   Arthritis Maternal Grandmother    Past Surgical History:  Procedure Laterality Date   BREAST BIOPSY Right 05/28/2018   [place] bx, INVASIVE MAMMARY CARCINOMA WITH LOBULAR FEATURES   BREAST LUMPECTOMY Right 06/12/2018   [place], lobular features   BREAST LUMPECTOMY WITH SENTINEL LYMPH NODE BIOPSY Right 06/12/2018   Procedure: RIGHT BREAST WIDE EXCISION WITH SENTINEL LYMPH NODE BX;  Surgeon:  Robert Bellow, MD;  Location: [facility];  Service: General;  Laterality: Right;   [place] Right 10/30/2018   Procedure: IRRIGATION AND DEBRIDEMENT RIGHT CHEST WALL WOUND;  Surgeon: Robert Bellow, MD;  Location: [facility];  Service: General;  Laterality: Right;   JOINT REPLACEMENT     MASTECTOMY Right    MASTECTOMY WITH AXILLARY LYMPH NODE DISSECTION Right 09/20/2018   Procedure: MASTECTOMY WITH AXILLARY LYMPH NODE DISSECTION RIGHT;  Surgeon: Robert Bellow, MD;  Location: [facility];  Service: General;  Laterality: Right;   ovaraian cyst removal Right    Cyst only (NOT OVARY)   PORTACATH PLACEMENT Left 09/20/2018   Procedure: INSERTION PORT-A-CATH, LEFT;  Surgeon: Robert Bellow, MD;  Location: [place] [facility];  Service: General;  Laterality: Left;   TOTAL KNEE ARTHROPLASTY Bilateral 05/05/2010   Social History   Social History Narrative   Retired    Lives by herself    Pets: None   Caffeine- Coffee 2 cups daily, no tea/soda      Immunization History  Administered Date(s) Administered   Fluad Quad(high Dose 65+) 01/06/2019   Influenza Split 12/28/2010   Influenza, High Dose Seasonal PF 01/22/2018   Influenza, Seasonal, Injecte, Preservative Fre 01/23/2012   Influenza,inj,Quad PF,6+ Mos 02/06/2013, 02/03/2014   Influenza,inj,quad, With Preservative 01/22/2017   Influenza-Unspecified 12/15/2014, 01/12/2016, 12/30/2016, 02/19/2020, 02/07/2021   PFIZER(Purple Top)SARS-COV-2 Vaccination 05/23/2019, 06/13/2019, 04/13/2020   Pfizer Covid-19 Vaccine Bivalent Booster 76yr & up 02/18/2021   Pneumococcal Conjugate-13 02/11/2016, 03/01/2017   Pneumococcal Polysaccharide-23 01/04/2010, 11/08/2012   Pneumococcal-Unspecified 01/22/2017   Tdap 01/22/2017   Zoster Recombinat (Shingrix) 03/01/2017   Zoster, Live 09/22/2008     Objective: Vital Signs: BP (!) 153/79 (BP Location: Left Arm, Patient Position: Sitting,  Cuff Size: Normal)   Pulse 81   Resp 16   Ht '5\' 5"'$  (1.651 m)   Wt 194 lb 12.8 oz (88.4 kg)   BMI 32.42 kg/m    Physical Exam Constitutional:      Appearance: She is obese.  Cardiovascular:     Rate and Rhythm: Normal rate and regular rhythm.  Pulmonary:     Effort: Pulmonary effort is normal.     Breath sounds: Normal breath sounds.  Skin:    General: Skin is warm and dry.     Findings: No rash.  Neurological:     Mental Status: She is alert.  Psychiatric:        Mood and Affect: Mood normal.      Musculoskeletal Exam:  Neck full ROM no tenderness Shoulders slightly restricted external rotation, right shoulder decreased abduction and some tenderness to pressure along lateral border Elbows full ROM no tenderness or swelling Wrists full ROM no tenderness or swelling Fingers full ROM no tenderness or swelling Knees full ROM, patellofemoral crepitus  present, no palpable effusions Ankles full ROM no tenderness or swelling No MTP squeeze tenderness   Investigation: No additional findings.  Imaging: No results found.  Recent Labs: Lab Results  Component Value Date   WBC 8.9 06/20/2021   HGB 14.1 06/20/2021   PLT 218 06/20/2021   NA 140 06/20/2021   K 3.6 06/20/2021   CL 105 06/20/2021   CO2 25 06/20/2021   GLUCOSE 125 (H) 06/20/2021   BUN 16 06/20/2021   CREATININE 0.57 06/20/2021   BILITOT 0.2 (L) 06/20/2021   ALKPHOS 91 06/20/2021   AST 21 06/20/2021   ALT 16 06/20/2021   PROT 7.2 06/20/2021   ALBUMIN 3.7 06/20/2021   CALCIUM 8.7 (L) 06/20/2021   GFRAA >60 12/08/2019    Speciality Comments: No specialty comments available.  Procedures:  No procedures performed Allergies: Patient has no known allergies.   Assessment / Plan:     Visit Diagnoses: Primary osteoarthritis involving multiple joints History of polymyalgia rheumatica  I suspect most of her chronic symptoms are more related to degenerative arthritis in multiple sites. Intermittent severe  proximal joint stiffness and pain though remains consistent with PMR. Her current steroid dosing 5-'10mg'$  daily PRN with inrtermittent flare ups is low enough I think this is equally or less risky than trying any other maintenance DMARDs.  Malignant neoplasm of right breast in female, estrogen receptor positive, unspecified site of breast ([place])  Ongoing hormone therapy may be increasing OA symptoms. May be able to discontinue this in the future if continuing to do well on continued monitoring.  Chronic right shoulder pain  Appears most consistent with bursitis or rotator cuff arthropathy or some impingement syndrome. Recommending continued PMR treatment and ROM exercises.  Orders: No orders of the defined types were placed in this encounter.  Meds ordered this encounter  Medications   predniSONE (DELTASONE) 5 MG tablet    Sig: prednisone 5 mg tablet  TAKE 1 TO 2 TABLETS BY MOUTH EVERY DAY AS NEEDED FOR ARTHRITIS FLARE    Dispense:  90 tablet    Refill:  0     Follow-Up Instructions: Return in about 1 year (around 09/28/2022) for PMR/OA on GC PRN f/u 65yr   CCollier Salina MD  Note - This record has been created using [employer]  Chart creation errors have been sought, but may not always  have been located. Such creation errors do not reflect on  the standard of medical care.

## 2021-09-21 ENCOUNTER — Ambulatory Visit: Payer: Medicare Other | Admitting: Internal Medicine

## 2021-09-27 ENCOUNTER — Encounter: Payer: Self-pay | Admitting: Internal Medicine

## 2021-09-27 ENCOUNTER — Ambulatory Visit: Payer: Medicare Other | Admitting: Internal Medicine

## 2021-09-27 VITALS — BP 153/79 | HR 81 | Resp 16 | Ht 65.0 in | Wt 194.8 lb

## 2021-09-27 DIAGNOSIS — M25511 Pain in right shoulder: Secondary | ICD-10-CM | POA: Diagnosis not present

## 2021-09-27 DIAGNOSIS — C50911 Malignant neoplasm of unspecified site of right female breast: Secondary | ICD-10-CM | POA: Diagnosis not present

## 2021-09-27 DIAGNOSIS — G8929 Other chronic pain: Secondary | ICD-10-CM | POA: Diagnosis not present

## 2021-09-27 DIAGNOSIS — Z170 Estrogen receptor positive status [ER+]: Secondary | ICD-10-CM

## 2021-09-27 DIAGNOSIS — Z8739 Personal history of other diseases of the musculoskeletal system and connective tissue: Secondary | ICD-10-CM

## 2021-09-27 DIAGNOSIS — M159 Polyosteoarthritis, unspecified: Secondary | ICD-10-CM | POA: Diagnosis not present

## 2021-10-01 ENCOUNTER — Other Ambulatory Visit: Payer: Self-pay

## 2021-10-01 ENCOUNTER — Ambulatory Visit
Admission: EM | Admit: 2021-10-01 | Discharge: 2021-10-01 | Disposition: A | Discharge status: Home / Self Care | Payer: Medicare Other | Attending: Emergency Medicine | Admitting: Emergency Medicine

## 2021-10-01 ENCOUNTER — Encounter: Payer: Self-pay | Admitting: Emergency Medicine

## 2021-10-01 DIAGNOSIS — N390 Urinary tract infection, site not specified: Secondary | ICD-10-CM | POA: Diagnosis not present

## 2021-10-01 LAB — POCT URINALYSIS DIP (MANUAL ENTRY)
Bilirubin, UA: NEGATIVE
Glucose, UA: NEGATIVE mg/dL
Ketones, POC UA: NEGATIVE mg/dL
Nitrite, UA: POSITIVE — AB
Protein Ur, POC: NEGATIVE mg/dL
Spec Grav, UA: <=1.005 — AB (ref 1.010–1.025)
Urobilinogen, UA: 0.2 E.U./dL
pH, UA: 6 (ref 5.0–8.0)

## 2021-10-01 MED ORDER — SULFAMETHOXAZOLE-TRIMETHOPRIM 800-160 MG PO TABS: 1.0000 | ORAL_TABLET | Freq: Two times a day (BID) | ORAL | 0 refills | Status: AC

## 2021-10-01 NOTE — ED Provider Notes (Signed)
Theresa Reid   MRN: [id number] DOB: [birth year]  Subjective:   Chief Complaint;  Chief Complaint  Patient presents with   Urinary Tract Infection  Pt comes in c/o UTI symptoms that started yesterday  Theresa Reid is a 81 y.o. female presenting for symptoms of dysuria since yesterday.  She denies nausea vomiting, ever, back pain abdominal pain or vaginal discharge.  No current facility-administered medications for this encounter.  Current Outpatient Medications:    sulfamethoxazole-trimethoprim (BACTRIM DS) 800-160 MG tablet, Take 1 tablet by mouth 2 (two) times daily for 7 days., Disp: 14 tablet, Rfl: 0   amLODipine (NORVASC) 5 MG tablet, TAKE 1 TABLET BY MOUTH  DAILY, Disp: 90 tablet, Rfl: 3   anastrozole (ARIMIDEX) 1 MG tablet, TAKE 1 TABLET BY MOUTH  DAILY DO NOT START UNTIL  COMPLETED RADIATION IN JAN  2021, Disp: 90 tablet, Rfl: 3   atorvastatin (LIPITOR) 80 MG tablet, TAKE 1 TABLET BY MOUTH  DAILY, Disp: 90 tablet, Rfl: 3   CALCIUM-VITAMIN D PO, Take 1 tablet by mouth daily. , Disp: , Rfl:    ketoconazole (NIZORAL) 2 % shampoo, Apply 1 application. topically 2 (two) times a week., Disp: 120 mL, Rfl: 6   Multiple Vitamins tablet, Take 1 tablet by mouth daily. , Disp: , Rfl:    omeprazole (PRILOSEC) 20 MG capsule, TAKE 1 CAPSULE BY MOUTH DAILY, Disp: 100 capsule, Rfl: 2   oxybutynin (DITROPAN) 5 MG tablet, TAKE 1 TABLET BY MOUTH  TWICE DAILY, Disp: 180 tablet, Rfl: 3   predniSONE (DELTASONE) 5 MG tablet, prednisone 5 mg tablet  TAKE 1 TO 2 TABLETS BY MOUTH EVERY DAY AS NEEDED FOR ARTHRITIS FLARE, Disp: 30 tablet, Rfl: 3   Turmeric 500 MG CAPS, Take 1,000 mg by mouth 2 (two) times daily. , Disp: , Rfl:    No Known Allergies  Past Medical History:  Diagnosis Date   Arthritis    Breast cancer ([place])    Diverticulitis    Family history of breast cancer    GERD (gastroesophageal reflux disease)    Hyperlipidemia    Hypertension    Personal history of  chemotherapy    Personal history of radiation therapy      Review of Systems  All other systems reviewed and are negative.    Objective:   Vitals: BP (!) 144/82 (BP Location: Left Arm)   Pulse 88   Temp 98.1 F (36.7 C) (Oral)   Resp 17   Ht '5\' 5"'$  (1.651 m)   Wt 194 lb 14.2 oz (88.4 kg)   SpO2 96%   BMI 32.43 kg/m   Physical Exam Vitals and nursing note reviewed.  Constitutional:      General: She is not in acute distress.    Appearance: She is well-developed.  HENT:     Head: Normocephalic and atraumatic.  Eyes:     Conjunctiva/sclera: Conjunctivae normal.  Cardiovascular:     Rate and Rhythm: Normal rate and regular rhythm.     Heart sounds: No murmur heard. Pulmonary:     Effort: Pulmonary effort is normal. No respiratory distress.     Breath sounds: Normal breath sounds.  Abdominal:     Palpations: Abdomen is soft.     Tenderness: There is no abdominal tenderness. There is no right CVA tenderness or left CVA tenderness.  Musculoskeletal:        General: No swelling.     Cervical back: Neck supple.  Skin:  General: Skin is warm and dry.     Capillary Refill: Capillary refill takes less than 2 seconds.  Neurological:     Mental Status: She is alert.  Psychiatric:        Mood and Affect: Mood normal.     Results for orders placed or performed during the hospital encounter of 10/01/21 (from the past 24 hour(s))  POCT urinalysis dipstick     Status: Abnormal   Collection Time: 10/01/21  2:41 PM  Result Value Ref Range   Color, UA orange (A) yellow   Clarity, UA cloudy (A) clear   Glucose, UA negative negative mg/dL   Bilirubin, UA negative negative   Ketones, POC UA negative negative mg/dL   Spec Grav, UA <=1.005 (A) 1.010 - 1.025   Blood, UA trace-lysed (A) negative   pH, UA 6.0 5.0 - 8.0   Protein Ur, POC negative negative mg/dL   Urobilinogen, UA 0.2 0.2 or 1.0 E.U./dL   Nitrite, UA Positive (A) Negative   Leukocytes, UA Large (3+) (A) Negative     No results found.     Assessment and Plan :   1. Urinary tract infection without hematuria, site unspecified     Meds ordered this encounter  Medications   sulfamethoxazole-trimethoprim (BACTRIM DS) 800-160 MG tablet    Sig: Take 1 tablet by mouth 2 (two) times daily for 7 days.    Dispense:  14 tablet    Refill:  0    MDM:  Theresa Reid is a 81 y.o. female presenting for Symptoms of dysuria since yesterday.  Her exam and vital signs were unremarkable.  Urine dip was positive for nitrite and leukocytes and trace lysed blood.  Patient was taking Azo.  Culture is pending patient encouraged to drink water and take antibiotics as directed she will return for new or worsening signs.  I discussed treatment, follow up and return instructions. Questions were answered. Patient/representative stated understanding of instructions and patient is stable for discharge.  I discussed treatment, follow up and return instructions. Questions were answered. Patient/representative stated understanding of instructions and patient is stable for discharge.  [name]    Hezzie Bump, NP 10/01/21 1454

## 2021-10-01 NOTE — Discharge Instructions (Signed)
You will be called with the results of your culture.  Your urine tested positive for infection today.  Take antibiotics as directed for 5 days continue for the full 7 if your symptoms persist.  Return if worse or new symptoms for reevaluation at any time

## 2021-10-01 NOTE — ED Triage Notes (Signed)
Pt comes in c/o UTI symptoms that started yesterday.

## 2021-10-02 ENCOUNTER — Ambulatory Visit: Payer: Medicare Other

## 2021-10-04 ENCOUNTER — Telehealth: Payer: Self-pay | Admitting: Internal Medicine

## 2021-10-04 LAB — URINE CULTURE: Culture: >=100000 — AB

## 2021-10-04 MED ORDER — PREDNISONE 5 MG PO TABS: ORAL_TABLET | ORAL | 0 refills | Status: DC

## 2021-10-04 NOTE — Telephone Encounter (Signed)
Patient called the office stating she had an appointment with Dr. Benjamine Mola last week. Patient states a refill of Prednisone was supposed to be called in but she has checked both pharmacies she uses and they do not have a prescription for Prednisone. Patient requests that someone look into this for her.

## 2021-10-04 NOTE — Telephone Encounter (Signed)
Patient advised Dr. Benjamine Mola sent Rx to her mail order pharmacy for 90 tablets supply. Patient states she does not need it right away.

## 2021-10-04 NOTE — Telephone Encounter (Signed)
Looks like it was not sent. I sent Rx to her mail order pharmacy for 90 tablets supply, if she needs it at a local pharmacy right away we can send.

## 2021-10-31 ENCOUNTER — Other Ambulatory Visit (INDEPENDENT_AMBULATORY_CARE_PROVIDER_SITE_OTHER): Payer: Medicare Other

## 2021-10-31 DIAGNOSIS — R7303 Prediabetes: Secondary | ICD-10-CM

## 2021-10-31 DIAGNOSIS — E785 Hyperlipidemia, unspecified: Secondary | ICD-10-CM | POA: Diagnosis not present

## 2021-10-31 DIAGNOSIS — I10 Essential (primary) hypertension: Secondary | ICD-10-CM | POA: Diagnosis not present

## 2021-10-31 LAB — LIPID PANEL
Cholesterol: 184 mg/dL (ref 0–200)
HDL: 56.1 mg/dL (ref >39.00)
LDL Cholesterol: 90 mg/dL (ref 0–99)
NonHDL: 127.51
Total CHOL/HDL Ratio: 3
Triglycerides: 189 mg/dL — ABNORMAL HIGH (ref 0.0–149.0)
VLDL: 37.8 mg/dL (ref 0.0–40.0)

## 2021-10-31 LAB — COMPREHENSIVE METABOLIC PANEL
ALT: 14 U/L (ref 0–35)
AST: 18 U/L (ref 0–37)
Albumin: 4.1 g/dL (ref 3.5–5.2)
Alkaline Phosphatase: 99 U/L (ref 39–117)
BUN: 11 mg/dL (ref 6–23)
CO2: 30 mEq/L (ref 19–32)
Calcium: 9.3 mg/dL (ref 8.4–10.5)
Chloride: 104 mEq/L (ref 96–112)
Creatinine, Ser: 0.53 mg/dL (ref 0.40–1.20)
GFR: 86.07 mL/min (ref >60.00)
Glucose, Bld: 91 mg/dL (ref 70–99)
Potassium: 4.1 mEq/L (ref 3.5–5.1)
Sodium: 141 mEq/L (ref 135–145)
Total Bilirubin: 0.4 mg/dL (ref 0.2–1.2)
Total Protein: 6.5 g/dL (ref 6.0–8.3)

## 2021-10-31 LAB — HEMOGLOBIN A1C: Hgb A1c MFr Bld: 6.1 % (ref 4.6–6.5)

## 2021-11-01 ENCOUNTER — Ambulatory Visit: Payer: Medicare Other | Admitting: Family Medicine

## 2021-11-02 ENCOUNTER — Ambulatory Visit (INDEPENDENT_AMBULATORY_CARE_PROVIDER_SITE_OTHER): Payer: Medicare Other | Admitting: Family Medicine

## 2021-11-02 ENCOUNTER — Encounter: Payer: Self-pay | Admitting: Family Medicine

## 2021-11-02 DIAGNOSIS — R7303 Prediabetes: Secondary | ICD-10-CM

## 2021-11-02 DIAGNOSIS — E785 Hyperlipidemia, unspecified: Secondary | ICD-10-CM | POA: Diagnosis not present

## 2021-11-02 DIAGNOSIS — K219 Gastro-esophageal reflux disease without esophagitis: Secondary | ICD-10-CM | POA: Diagnosis not present

## 2021-11-02 DIAGNOSIS — I10 Essential (primary) hypertension: Secondary | ICD-10-CM

## 2021-11-02 NOTE — Patient Instructions (Signed)
Nice to see you. Please try to get some more exercise. Please reduce your carbohydrate intake.

## 2021-11-02 NOTE — Assessment & Plan Note (Addendum)
Patient will continue omeprazole 20 mg daily.  I did discuss GI referral given her age and duration of reflux symptoms.  Discussed the need for an EGD though she declines this at this time.  She understands that this evaluation would be to look for an ulcer or cancer that could be contributing to her symptoms.  She will let me know if she changes her mind on seeing GI.

## 2021-11-02 NOTE — Assessment & Plan Note (Signed)
Adequately controlled.  She will continue Lipitor 80 mg daily.  I discussed trying reduce carbohydrate intake to help with her triglyceride levels.

## 2021-11-02 NOTE — Assessment & Plan Note (Signed)
Adequately controlled.  She will continue amlodipine 5 mg daily. 

## 2021-11-02 NOTE — Assessment & Plan Note (Signed)
Encouraged getting more exercise.  Discussed reducing carbohydrate intake to see if that would help with her sugar levels as well.

## 2021-11-02 NOTE — Progress Notes (Signed)
Tommi Rumps, MD Phone: [phone]  Theresa Reid is a 81 y.o. female who presents today for f/u.  HYPERTENSION Disease Monitoring Home BP Monitoring not checking Chest pain- no    Dyspnea- no Medications Compliance-  taking amlodipine.   Edema- no BMET    Component Value Date/Time   NA 141 10/31/2021 1022   NA 142 07/16/2015 0000   NA 138 11/08/2012 0548   K 4.1 10/31/2021 1022   K 3.3 (L) 11/08/2012 0548   CL 104 10/31/2021 1022   CL 103 11/08/2012 0548   CO2 30 10/31/2021 1022   CO2 28 11/08/2012 0548   GLUCOSE 91 10/31/2021 1022   GLUCOSE 97 11/08/2012 0548   BUN 11 10/31/2021 1022   BUN 10 07/16/2015 0000   BUN 6 (L) 11/08/2012 0548   CREATININE 0.53 10/31/2021 1022   CREATININE 0.60 11/08/2012 0548   CALCIUM 9.3 10/31/2021 1022   CALCIUM 8.7 11/08/2012 0548   GFRNONAA >60 06/20/2021 1250   GFRNONAA >60 11/08/2012 0548   GFRAA >60 12/08/2019 1247   GFRAA >60 11/08/2012 0548   HYPERLIPIDEMIA Symptoms Chest pain on exertion:  no   Leg claudication:   no Medications: Compliance- taking lipitor Right upper quadrant pain- no  Muscle aches- no Lipid Panel     Component Value Date/Time   CHOL 184 10/31/2021 1022   CHOL 204 (H) 11/13/2014 0948   TRIG 189.0 (H) 10/31/2021 1022   HDL 56.10 10/31/2021 1022   HDL 56 11/13/2014 0948   CHOLHDL 3 10/31/2021 1022   VLDL 37.8 10/31/2021 1022   LDLCALC 90 10/31/2021 1022   LDLCALC 106 (H) 11/13/2014 0948   LDLDIRECT 90.0 07/11/2017 0948   LABVLDL 42 (H) 11/13/2014 0948   Prediabetes: eats mostly fruits, vegetables, and dairy products. Some meats. She is moving soon and will be able to get more activity where she is moving to.   GERD:   Reflux symptoms: no   Abd pain: no      EGD: no  Medication: omperazole   Social History   Tobacco Use  Smoking Status Never  Smokeless Tobacco Never    Current Outpatient Medications on File Prior to Visit  Medication Sig Dispense Refill   amLODipine (NORVASC) 5 MG  tablet TAKE 1 TABLET BY MOUTH  DAILY 90 tablet 3   anastrozole (ARIMIDEX) 1 MG tablet TAKE 1 TABLET BY MOUTH  DAILY DO NOT START UNTIL  COMPLETED RADIATION IN JAN  2021 90 tablet 3   atorvastatin (LIPITOR) 80 MG tablet TAKE 1 TABLET BY MOUTH  DAILY 90 tablet 3   CALCIUM-VITAMIN D PO Take 1 tablet by mouth daily.      ketoconazole (NIZORAL) 2 % shampoo Apply 1 application. topically 2 (two) times a week. 120 mL 6   Multiple Vitamins tablet Take 1 tablet by mouth daily.      omeprazole (PRILOSEC) 20 MG capsule TAKE 1 CAPSULE BY MOUTH DAILY 100 capsule 2   oxybutynin (DITROPAN) 5 MG tablet TAKE 1 TABLET BY MOUTH  TWICE DAILY 180 tablet 3   predniSONE (DELTASONE) 5 MG tablet prednisone 5 mg tablet  TAKE 1 TO 2 TABLETS BY MOUTH EVERY DAY AS NEEDED FOR ARTHRITIS FLARE 90 tablet 0   [DISCONTINUED] prochlorperazine (COMPAZINE) 10 MG tablet Take 1 tablet (10 mg total) by mouth every 6 (six) hours as needed (Nausea or vomiting). 30 tablet 1   No current facility-administered medications on file prior to visit.     ROS see history of present  illness  Objective  Physical Exam Vitals:   11/02/21 1415  BP: 120/80  Pulse: (!) 104  Temp: 98 F (36.7 C)  SpO2: 96%    BP Readings from Last 3 Encounters:  11/02/21 120/80  10/01/21 (!) 144/82  09/27/21 (!) 153/79   Wt Readings from Last 3 Encounters:  11/02/21 196 lb (88.9 kg)  10/01/21 194 lb 14.2 oz (88.4 kg)  09/27/21 194 lb 12.8 oz (88.4 kg)    Physical Exam Constitutional:      General: She is not in acute distress.    Appearance: She is not diaphoretic.  Cardiovascular:     Rate and Rhythm: Normal rate and regular rhythm.     Heart sounds: Normal heart sounds.  Pulmonary:     Effort: Pulmonary effort is normal.     Breath sounds: Normal breath sounds.  Skin:    General: Skin is warm and dry.  Neurological:     Mental Status: She is alert.      Assessment/Plan: Please see individual problem list.  Problem List Items  Addressed This Visit     GERD (gastroesophageal reflux disease) (Chronic)    Patient will continue omeprazole 20 mg daily.  I did discuss GI referral given her age and duration of reflux symptoms.  Discussed the need for an EGD though she declines this at this time.  She understands that this evaluation would be to look for an ulcer or cancer that could be contributing to her symptoms.  She will let me know if she changes her mind on seeing GI.      HLD (hyperlipidemia) (Chronic)    Adequately controlled.  She will continue Lipitor 80 mg daily.  I discussed trying reduce carbohydrate intake to help with her triglyceride levels.      Hypertension (Chronic)    Adequately controlled.  She will continue amlodipine 5 mg daily.      Prediabetes (Chronic)    Encouraged getting more exercise.  Discussed reducing carbohydrate intake to see if that would help with her sugar levels as well.        Return in about 6 months (around 05/05/2022) for Hypertension.   Tommi Rumps, MD [place]

## 2021-11-04 ENCOUNTER — Other Ambulatory Visit: Payer: Self-pay

## 2021-11-04 ENCOUNTER — Inpatient Hospital Stay: Payer: Medicare Other

## 2021-11-04 ENCOUNTER — Emergency Department: Payer: Medicare Other

## 2021-11-04 ENCOUNTER — Inpatient Hospital Stay
Admission: EM | Admit: 2021-11-04 | Discharge: 2021-11-08 | DRG: 552 | Disposition: A | Discharge status: Home / Self Care | Payer: Medicare Other | Attending: Internal Medicine | Admitting: Internal Medicine

## 2021-11-04 DIAGNOSIS — S79911A Unspecified injury of right hip, initial encounter: Secondary | ICD-10-CM | POA: Diagnosis not present

## 2021-11-04 DIAGNOSIS — S12100A Unspecified displaced fracture of second cervical vertebra, initial encounter for closed fracture: Secondary | ICD-10-CM | POA: Diagnosis not present

## 2021-11-04 DIAGNOSIS — W19XXXA Unspecified fall, initial encounter: Secondary | ICD-10-CM | POA: Diagnosis not present

## 2021-11-04 DIAGNOSIS — K219 Gastro-esophageal reflux disease without esophagitis: Secondary | ICD-10-CM | POA: Diagnosis not present

## 2021-11-04 DIAGNOSIS — R7303 Prediabetes: Secondary | ICD-10-CM | POA: Diagnosis present

## 2021-11-04 DIAGNOSIS — R519 Headache, unspecified: Secondary | ICD-10-CM | POA: Diagnosis not present

## 2021-11-04 DIAGNOSIS — S12110A Anterior displaced Type II dens fracture, initial encounter for closed fracture: Secondary | ICD-10-CM | POA: Diagnosis not present

## 2021-11-04 DIAGNOSIS — S12111K Posterior displaced Type II dens fracture, subsequent encounter for fracture with nonunion: Secondary | ICD-10-CM | POA: Diagnosis not present

## 2021-11-04 DIAGNOSIS — D72829 Elevated white blood cell count, unspecified: Secondary | ICD-10-CM | POA: Diagnosis present

## 2021-11-04 DIAGNOSIS — M25572 Pain in left ankle and joints of left foot: Secondary | ICD-10-CM | POA: Diagnosis not present

## 2021-11-04 DIAGNOSIS — Z803 Family history of malignant neoplasm of breast: Secondary | ICD-10-CM

## 2021-11-04 DIAGNOSIS — S12111A Posterior displaced Type II dens fracture, initial encounter for closed fracture: Secondary | ICD-10-CM | POA: Diagnosis not present

## 2021-11-04 DIAGNOSIS — W109XXA Fall (on) (from) unspecified stairs and steps, initial encounter: Secondary | ICD-10-CM | POA: Diagnosis present

## 2021-11-04 DIAGNOSIS — Z79811 Long term (current) use of aromatase inhibitors: Secondary | ICD-10-CM

## 2021-11-04 DIAGNOSIS — Z833 Family history of diabetes mellitus: Secondary | ICD-10-CM | POA: Diagnosis not present

## 2021-11-04 DIAGNOSIS — I499 Cardiac arrhythmia, unspecified: Secondary | ICD-10-CM | POA: Diagnosis not present

## 2021-11-04 DIAGNOSIS — M25551 Pain in right hip: Secondary | ICD-10-CM | POA: Diagnosis not present

## 2021-11-04 DIAGNOSIS — M47812 Spondylosis without myelopathy or radiculopathy, cervical region: Secondary | ICD-10-CM | POA: Diagnosis not present

## 2021-11-04 DIAGNOSIS — I10 Essential (primary) hypertension: Secondary | ICD-10-CM | POA: Diagnosis present

## 2021-11-04 DIAGNOSIS — T380X5A Adverse effect of glucocorticoids and synthetic analogues, initial encounter: Secondary | ICD-10-CM | POA: Diagnosis present

## 2021-11-04 DIAGNOSIS — M1990 Unspecified osteoarthritis, unspecified site: Secondary | ICD-10-CM | POA: Diagnosis present

## 2021-11-04 DIAGNOSIS — S12031A Nondisplaced posterior arch fracture of first cervical vertebra, initial encounter for closed fracture: Secondary | ICD-10-CM | POA: Diagnosis not present

## 2021-11-04 DIAGNOSIS — Z79899 Other long term (current) drug therapy: Secondary | ICD-10-CM | POA: Diagnosis not present

## 2021-11-04 DIAGNOSIS — Z8261 Family history of arthritis: Secondary | ICD-10-CM | POA: Diagnosis not present

## 2021-11-04 DIAGNOSIS — Z8709 Personal history of other diseases of the respiratory system: Secondary | ICD-10-CM | POA: Diagnosis not present

## 2021-11-04 DIAGNOSIS — S0990XA Unspecified injury of head, initial encounter: Secondary | ICD-10-CM | POA: Diagnosis not present

## 2021-11-04 DIAGNOSIS — Z853 Personal history of malignant neoplasm of breast: Secondary | ICD-10-CM

## 2021-11-04 DIAGNOSIS — E785 Hyperlipidemia, unspecified: Secondary | ICD-10-CM | POA: Diagnosis not present

## 2021-11-04 DIAGNOSIS — Z743 Need for continuous supervision: Secondary | ICD-10-CM | POA: Diagnosis not present

## 2021-11-04 DIAGNOSIS — S299XXA Unspecified injury of thorax, initial encounter: Secondary | ICD-10-CM | POA: Diagnosis not present

## 2021-11-04 DIAGNOSIS — Y92019 Unspecified place in single-family (private) house as the place of occurrence of the external cause: Secondary | ICD-10-CM

## 2021-11-04 DIAGNOSIS — Z923 Personal history of irradiation: Secondary | ICD-10-CM

## 2021-11-04 DIAGNOSIS — Z96653 Presence of artificial knee joint, bilateral: Secondary | ICD-10-CM | POA: Diagnosis present

## 2021-11-04 DIAGNOSIS — Z811 Family history of alcohol abuse and dependence: Secondary | ICD-10-CM

## 2021-11-04 DIAGNOSIS — M47816 Spondylosis without myelopathy or radiculopathy, lumbar region: Secondary | ICD-10-CM | POA: Diagnosis not present

## 2021-11-04 DIAGNOSIS — Z9221 Personal history of antineoplastic chemotherapy: Secondary | ICD-10-CM

## 2021-11-04 DIAGNOSIS — S199XXA Unspecified injury of neck, initial encounter: Secondary | ICD-10-CM | POA: Diagnosis not present

## 2021-11-04 DIAGNOSIS — M542 Cervicalgia: Secondary | ICD-10-CM | POA: Diagnosis not present

## 2021-11-04 DIAGNOSIS — M532X2 Spinal instabilities, cervical region: Secondary | ICD-10-CM | POA: Diagnosis not present

## 2021-11-04 DIAGNOSIS — I6521 Occlusion and stenosis of right carotid artery: Secondary | ICD-10-CM | POA: Diagnosis not present

## 2021-11-04 LAB — COMPREHENSIVE METABOLIC PANEL
ALT: 20 U/L (ref 0–44)
AST: 26 U/L (ref 15–41)
Albumin: 3.6 g/dL (ref 3.5–5.0)
Alkaline Phosphatase: 81 U/L (ref 38–126)
Anion gap: 5 (ref 5–15)
BUN: 15 mg/dL (ref 8–23)
CO2: 25 mmol/L (ref 22–32)
Calcium: 9 mg/dL (ref 8.9–10.3)
Chloride: 110 mmol/L (ref 98–111)
Creatinine, Ser: 0.64 mg/dL (ref 0.44–1.00)
GFR, Estimated: >60 mL/min (ref >60)
Glucose, Bld: 200 mg/dL — ABNORMAL HIGH (ref 70–99)
Potassium: 3.9 mmol/L (ref 3.5–5.1)
Sodium: 140 mmol/L (ref 135–145)
Total Bilirubin: 0.6 mg/dL (ref 0.3–1.2)
Total Protein: 6.8 g/dL (ref 6.5–8.1)

## 2021-11-04 LAB — CBC WITH DIFFERENTIAL/PLATELET
Abs Immature Granulocytes: 0.16 10*3/uL — ABNORMAL HIGH (ref 0.00–0.07)
Basophils Absolute: 0 10*3/uL (ref 0.0–0.1)
Basophils Relative: 0 %
Eosinophils Absolute: 0 10*3/uL (ref 0.0–0.5)
Eosinophils Relative: 0 %
HCT: 41 % (ref 36.0–46.0)
Hemoglobin: 13.2 g/dL (ref 12.0–15.0)
Immature Granulocytes: 1 %
Lymphocytes Relative: 9 %
Lymphs Abs: 1.6 10*3/uL (ref 0.7–4.0)
MCH: 29.3 pg (ref 26.0–34.0)
MCHC: 32.2 g/dL (ref 30.0–36.0)
MCV: 91.1 fL (ref 80.0–100.0)
Monocytes Absolute: 1.1 10*3/uL — ABNORMAL HIGH (ref 0.1–1.0)
Monocytes Relative: 6 %
Neutro Abs: 14.4 10*3/uL — ABNORMAL HIGH (ref 1.7–7.7)
Neutrophils Relative %: 84 %
Platelets: 188 10*3/uL (ref 150–400)
RBC: 4.5 MIL/uL (ref 3.87–5.11)
RDW: 14.7 % (ref 11.5–15.5)
WBC: 17.2 10*3/uL — ABNORMAL HIGH (ref 4.0–10.5)
nRBC: 0 % (ref 0.0–0.2)

## 2021-11-04 MED ORDER — IOHEXOL 350 MG/ML SOLN
75.0000 mL | Freq: Once | INTRAVENOUS | Status: AC | PRN
  Administered 2021-11-04: 75 mL via INTRAVENOUS

## 2021-11-04 MED ORDER — SENNOSIDES-DOCUSATE SODIUM 8.6-50 MG PO TABS: 1.0000 | ORAL_TABLET | Freq: Every evening | ORAL | Status: DC | PRN

## 2021-11-04 MED ORDER — BISACODYL 5 MG PO TBEC: 5.0000 mg | DELAYED_RELEASE_TABLET | Freq: Every day | ORAL | Status: DC | PRN

## 2021-11-04 MED ORDER — ALBUTEROL SULFATE (2.5 MG/3ML) 0.083% IN NEBU: 2.5000 mg | INHALATION_SOLUTION | Freq: Four times a day (QID) | RESPIRATORY_TRACT | Status: DC | PRN

## 2021-11-04 MED ORDER — PANTOPRAZOLE SODIUM 40 MG PO TBEC
40.0000 mg | DELAYED_RELEASE_TABLET | Freq: Every day | ORAL | Status: DC
  Administered 2021-11-04 – 2021-11-08 (×5): 40 mg via ORAL
  Filled 2021-11-04 (×5): qty 1

## 2021-11-04 MED ORDER — OXYBUTYNIN CHLORIDE 5 MG PO TABS
5.0000 mg | ORAL_TABLET | Freq: Two times a day (BID) | ORAL | Status: DC
  Administered 2021-11-05 – 2021-11-08 (×7): 5 mg via ORAL
  Filled 2021-11-04 (×9): qty 1

## 2021-11-04 MED ORDER — ATORVASTATIN CALCIUM 20 MG PO TABS
80.0000 mg | ORAL_TABLET | Freq: Every day | ORAL | Status: DC
  Administered 2021-11-05 – 2021-11-07 (×3): 80 mg via ORAL
  Filled 2021-11-04 (×3): qty 4

## 2021-11-04 MED ORDER — TRAZODONE HCL 50 MG PO TABS
25.0000 mg | ORAL_TABLET | Freq: Every evening | ORAL | Status: DC | PRN
  Administered 2021-11-04: 25 mg via ORAL
  Filled 2021-11-04: qty 1

## 2021-11-04 MED ORDER — ONDANSETRON HCL 4 MG PO TABS: 4.0000 mg | ORAL_TABLET | Freq: Four times a day (QID) | ORAL | Status: DC | PRN

## 2021-11-04 MED ORDER — ACETAMINOPHEN 650 MG RE SUPP: 650.0000 mg | Freq: Four times a day (QID) | RECTAL | Status: DC | PRN

## 2021-11-04 MED ORDER — ACETAMINOPHEN 325 MG PO TABS
650.0000 mg | ORAL_TABLET | Freq: Four times a day (QID) | ORAL | Status: DC | PRN
  Administered 2021-11-06: 650 mg via ORAL
  Filled 2021-11-04: qty 2

## 2021-11-04 MED ORDER — AMLODIPINE BESYLATE 5 MG PO TABS
5.0000 mg | ORAL_TABLET | Freq: Every day | ORAL | Status: DC
  Administered 2021-11-05 – 2021-11-08 (×4): 5 mg via ORAL
  Filled 2021-11-04 (×4): qty 1

## 2021-11-04 MED ORDER — HYDROCODONE-ACETAMINOPHEN 5-325 MG PO TABS
1.0000 | ORAL_TABLET | ORAL | Status: DC | PRN
  Administered 2021-11-04: 2 via ORAL
  Administered 2021-11-05: 1 via ORAL
  Administered 2021-11-07 – 2021-11-08 (×4): 2 via ORAL
  Filled 2021-11-04 (×4): qty 2
  Filled 2021-11-04: qty 1
  Filled 2021-11-04: qty 2

## 2021-11-04 MED ORDER — HYDROMORPHONE HCL 1 MG/ML IJ SOLN
0.5000 mg | Freq: Once | INTRAMUSCULAR | Status: AC
  Administered 2021-11-04: 0.5 mg via INTRAVENOUS
  Filled 2021-11-04: qty 0.5

## 2021-11-04 MED ORDER — HEPARIN SODIUM (PORCINE) 5000 UNIT/ML IJ SOLN
5000.0000 [IU] | Freq: Three times a day (TID) | INTRAMUSCULAR | Status: DC
  Administered 2021-11-04 – 2021-11-06 (×4): 5000 [IU] via SUBCUTANEOUS
  Filled 2021-11-04 (×4): qty 1

## 2021-11-04 MED ORDER — IPRATROPIUM BROMIDE 0.02 % IN SOLN: 0.5000 mg | Freq: Four times a day (QID) | RESPIRATORY_TRACT | Status: DC | PRN

## 2021-11-04 MED ORDER — ANASTROZOLE 1 MG PO TABS
1.0000 mg | ORAL_TABLET | Freq: Every day | ORAL | Status: DC
  Administered 2021-11-05 – 2021-11-08 (×4): 1 mg via ORAL
  Filled 2021-11-04 (×4): qty 1

## 2021-11-04 MED ORDER — ONDANSETRON HCL 4 MG/2ML IJ SOLN
4.0000 mg | Freq: Four times a day (QID) | INTRAMUSCULAR | Status: DC | PRN
  Administered 2021-11-05: 4 mg via INTRAVENOUS
  Filled 2021-11-04: qty 2

## 2021-11-04 MED ORDER — MORPHINE SULFATE (PF) 2 MG/ML IV SOLN
1.0000 mg | Freq: Four times a day (QID) | INTRAVENOUS | Status: DC | PRN
  Administered 2021-11-05: 1 mg via INTRAVENOUS
  Filled 2021-11-04: qty 1

## 2021-11-04 NOTE — ED Notes (Addendum)
Pt to ED for fall that occurred at home, pt states she got unsteady on her feet and fell down 6 steps onto concrete denies LOC. Pt states she did hit her head, denies blood thinner use. Pt has pain in her neck radiating down to upper shoulders. Pt denies blurry vision, denies any new paraesthesia,states she has hx of neuropathy.  C collar in place.  Pt is A&Ox4.

## 2021-11-04 NOTE — ED Triage Notes (Signed)
Patient to ER via POV, reports losing her balance this afternoon and falling down 6 steps onto concrete, reports hitting head, denies LOC. Placed in C collar. Reports neck pain and right hip pain. Denies blood thinner usage.

## 2021-11-04 NOTE — H&P (Signed)
History and Physical   [facility] [place] [facility] Admission History and Physical [employer], D.O.    Patient Name: Theresa Reid MR#: [id number] Date of Birth: 1941-02-01 Date of Admission: 11/04/2021  Referring MD/NP/PA: Dr. Starleen Blue Primary Care Physician: Leone Haven, MD  Chief Complaint:  Chief Complaint  Patient presents with   Fall    HPI: Theresa Reid is a 81 y.o. female with a known history of OA, BRCA, GERD, HTN, HLD presents to the emergency department for evaluation of neck pain s/p fall.  Patient was in a usual state of health until today when she lost balance and fell down 6 stairs landing on her right hip.  .  Patient denies fevers/chills, weakness, dizziness, chest pain, shortness of breath, N/V/C/D, abdominal pain, dysuria/frequency, changes in mental status.   Of note patient has been taking prednisone for an arthritis flare recently.     Otherwise there has been no change in status. Patient has been taking medication as prescribed and there has been no recent change in medication or diet.  No recent antibiotics.  There has been no recent illness, hospitalizations, travel or sick contacts.     EMS/ED Course: Patient received Dilaudid. Neurosurgery was contacted who requested medical admission, C-Collar and MRI and CTA to determine surgical needs related to odontoid C1 and unstable dens fracture  Review of Systems:  CONSTITUTIONAL: No fever/chills, fatigue, weakness, weight gain/loss, headache. EYES: No blurry or double vision. ENT: No tinnitus, postnasal drip, redness or soreness of the oropharynx. RESPIRATORY: No cough, dyspnea, wheeze.  No hemoptysis.  CARDIOVASCULAR: No chest pain, palpitations, syncope, orthopnea. No lower extremity edema.  GASTROINTESTINAL: No nausea, vomiting, abdominal pain, diarrhea, constipation.  No hematemesis, melena or hematochezia. GENITOURINARY: No dysuria, frequency, hematuria. ENDOCRINE: No polyuria or  nocturia. No heat or cold intolerance. HEMATOLOGY: No anemia, bruising, bleeding. INTEGUMENTARY: No rashes, ulcers, lesions. MUSCULOSKELETAL: Positive head, neck and right hip pain.   NEUROLOGIC: No numbness, tingling, ataxia, seizure-type activity, weakness. PSYCHIATRIC: No anxiety, depression, insomnia.   Past Medical History:  Diagnosis Date   Arthritis    Breast cancer ([place])    Diverticulitis    Family history of breast cancer    GERD (gastroesophageal reflux disease)    Hyperlipidemia    Hypertension    Personal history of chemotherapy    Personal history of radiation therapy     Past Surgical History:  Procedure Laterality Date   BREAST BIOPSY Right 05/28/2018   [place] bx, INVASIVE MAMMARY CARCINOMA WITH LOBULAR FEATURES   BREAST LUMPECTOMY Right 06/12/2018   [place], lobular features   BREAST LUMPECTOMY WITH SENTINEL LYMPH NODE BIOPSY Right 06/12/2018   Procedure: RIGHT BREAST WIDE EXCISION WITH SENTINEL LYMPH NODE BX;  Surgeon: Robert Bellow, MD;  Location: [facility] ORS;  Service: General;  Laterality: Right;   [place] Right 10/30/2018   Procedure: IRRIGATION AND DEBRIDEMENT RIGHT CHEST WALL WOUND;  Surgeon: Robert Bellow, MD;  Location: [facility] ORS;  Service: General;  Laterality: Right;   JOINT REPLACEMENT     MASTECTOMY Right    MASTECTOMY WITH AXILLARY LYMPH NODE DISSECTION Right 09/20/2018   Procedure: MASTECTOMY WITH AXILLARY LYMPH NODE DISSECTION RIGHT;  Surgeon: Robert Bellow, MD;  Location: [facility] ORS;  Service: General;  Laterality: Right;   ovaraian cyst removal Right    Cyst only (NOT OVARY)   PORTACATH PLACEMENT Left 09/20/2018   Procedure: INSERTION PORT-A-CATH,  LEFT;  Surgeon: Robert Bellow, MD;  Location: [facility] ORS;  Service: General;  Laterality: Left;   TOTAL KNEE ARTHROPLASTY Bilateral 05/05/2010     reports that she has never smoked. She has never used smokeless tobacco. She  reports that she does not drink alcohol and does not use drugs.  No Known Allergies  Family History  Problem Relation Age of Onset   Alcohol abuse Mother        deceased 19   Arthritis Mother    Diabetes Father    Breast cancer Maternal Aunt        dx 84s; deceased 71s   Breast cancer Maternal Aunt        dx 77s; deceased 17s   Arthritis Maternal Grandmother     Prior to Admission medications   Medication Sig Start Date End Date Taking? Authorizing Provider  amLODipine (NORVASC) 5 MG tablet TAKE 1 TABLET BY MOUTH  DAILY 08/05/21   Leone Haven, MD  anastrozole (ARIMIDEX) 1 MG tablet TAKE 1 TABLET BY MOUTH  DAILY DO NOT START UNTIL  COMPLETED RADIATION IN JAN  2021 08/04/21   Sindy Guadeloupe, MD  atorvastatin (LIPITOR) 80 MG tablet TAKE 1 TABLET BY MOUTH  DAILY 08/12/21   Leone Haven, MD  CALCIUM-VITAMIN D PO Take 1 tablet by mouth daily.     [provider]  ketoconazole (NIZORAL) 2 % shampoo Apply 1 application. topically 2 (two) times a week. 09/15/21   Ralene Bathe, MD  Multiple Vitamins tablet Take 1 tablet by mouth daily.     [provider]  omeprazole (PRILOSEC) 20 MG capsule TAKE 1 CAPSULE BY MOUTH DAILY 08/29/21   Leone Haven, MD  oxybutynin (DITROPAN) 5 MG tablet TAKE 1 TABLET BY MOUTH  TWICE DAILY 08/05/21   Leone Haven, MD  predniSONE (DELTASONE) 5 MG tablet prednisone 5 mg tablet  TAKE 1 TO 2 TABLETS BY MOUTH EVERY DAY AS NEEDED FOR ARTHRITIS FLARE 10/04/21   Rice, Resa Miner, MD  prochlorperazine (COMPAZINE) 10 MG tablet Take 1 tablet (10 mg total) by mouth every 6 (six) hours as needed (Nausea or vomiting). 10/10/18 11/18/18  Sindy Guadeloupe, MD    Physical Exam: Vitals:   11/04/21 1725 11/04/21 1815 11/04/21 1900  BP: (!) 154/59 (!) 160/64 (!) 166/69  Pulse: 82 66 69  Resp: 17 (!) 23 (!) 23  Temp: 98 F (36.7 C)    SpO2: 94% 97% 94%  Weight: 88.9 kg    Height: '5\' 5"'  (1.651 m)      GENERAL: 81 y.o.-year-old white  female patient, well-developed, well-nourished lying in the bed in no acute distress.  Pleasant and cooperative.   HEENT: Head atraumatic, normocephalic. Pupils equal. Mucus membranes moist. NECK: Supple. No JVD. CHEST: Normal breath sounds bilaterally. No wheezing, rales, rhonchi or crackles. No use of accessory muscles of respiration.  No reproducible chest wall tenderness.  CARDIOVASCULAR: S1, S2 normal. No murmurs, rubs, or gallops. Cap refill <2 seconds. Pulses intact distally.  ABDOMEN: Soft, nondistended, nontender. No rebound, guarding, rigidity. Normoactive bowel sounds present in all four quadrants.  EXTREMITIES: No pedal edema, cyanosis, or clubbing. No calf tenderness or Homan's sign.  NEUROLOGIC: The patient is alert and oriented x 3. Cranial nerves II through XII are grossly intact with no focal sensorimotor deficit. PSYCHIATRIC:  Normal affect, mood, thought content. SKIN: Warm, dry, and intact without obvious rash, lesion, or ulcer.    Labs on Admission:  CBC: Recent  Labs  Lab 11/04/21 1728  WBC 17.2*  NEUTROABS 14.4*  HGB 13.2  HCT 41.0  MCV 91.1  PLT 423   Basic Metabolic Panel: Recent Labs  Lab 10/31/21 1022 11/04/21 1728  NA 141 140  K 4.1 3.9  CL 104 110  CO2 30 25  GLUCOSE 91 200*  BUN 11 15  CREATININE 0.53 0.64  CALCIUM 9.3 9.0   GFR: Estimated Creatinine Clearance: 59.7 mL/min (by C-G formula based on SCr of 0.64 mg/dL). Liver Function Tests: Recent Labs  Lab 10/31/21 1022 11/04/21 1728  AST 18 26  ALT 14 20  ALKPHOS 99 81  BILITOT 0.4 0.6  PROT 6.5 6.8  ALBUMIN 4.1 3.6   No results for input(s): "LIPASE", "AMYLASE" in the last 168 hours. No results for input(s): "AMMONIA" in the last 168 hours. Coagulation Profile: No results for input(s): "INR", "PROTIME" in the last 168 hours. Cardiac Enzymes: No results for input(s): "CKTOTAL", "CKMB", "CKMBINDEX", "TROPONINI" in the last 168 hours. BNP (last 3 results) No results for input(s):  "PROBNP" in the last 8760 hours. HbA1C: No results for input(s): "HGBA1C" in the last 72 hours. CBG: No results for input(s): "GLUCAP" in the last 168 hours. Lipid Profile: No results for input(s): "CHOL", "HDL", "LDLCALC", "TRIG", "CHOLHDL", "LDLDIRECT" in the last 72 hours. Thyroid Function Tests: No results for input(s): "TSH", "T4TOTAL", "FREET4", "T3FREE", "THYROIDAB" in the last 72 hours. Anemia Panel: No results for input(s): "VITAMINB12", "FOLATE", "FERRITIN", "TIBC", "IRON", "RETICCTPCT" in the last 72 hours. Urine analysis:    Component Value Date/Time   COLORURINE YELLOW (A) 02/10/2019 1136   APPEARANCEUR CLEAR (A) 02/10/2019 1136   APPEARANCEUR Slightly cloudy 05/22/2017 0945   LABSPEC 1.004 (L) 02/10/2019 1136   LABSPEC 1.004 11/09/2012 1309   PHURINE 6.0 02/10/2019 1136   GLUCOSEU NEGATIVE 02/10/2019 1136   GLUCOSEU Negative 11/09/2012 1309   HGBUR NEGATIVE 02/10/2019 1136   BILIRUBINUR negative 10/01/2021 1441   BILIRUBINUR negative 11/05/2017 1559   BILIRUBINUR Negative 05/22/2017 0945   BILIRUBINUR Negative 11/09/2012 1309   KETONESUR negative 10/01/2021 1441   KETONESUR NEGATIVE 02/10/2019 1136   PROTEINUR negative 10/01/2021 1441   PROTEINUR NEGATIVE 02/10/2019 1136   UROBILINOGEN 0.2 10/01/2021 1441   NITRITE Positive (A) 10/01/2021 1441   NITRITE NEGATIVE 02/10/2019 1136   LEUKOCYTESUR Large (3+) (A) 10/01/2021 1441   LEUKOCYTESUR SMALL (A) 02/10/2019 1136   LEUKOCYTESUR Negative 11/09/2012 1309   Sepsis Labs: '@LABRCNTIP' (procalcitonin:4,lacticidven:4) )No results found for this or any previous visit (from the past 240 hour(s)).   Radiological Exams on Admission: DG Hip Unilat W or Wo Pelvis 2-3 Views Right  Result Date: 11/04/2021 CLINICAL DATA:  Trauma, fall EXAM: DG HIP (WITH OR WITHOUT PELVIS) 2-3V RIGHT COMPARISON:  01/01/2017 FINDINGS: No fracture or dislocation is seen. Joint spaces in both hips appear symmetrical. Degenerative changes are noted  in the visualized lower lumbar spine. No significant interval changes are noted. IMPRESSION: No fracture or dislocation is seen in pelvis and right hip. Electronically Signed   By: Elmer Picker M.D.   On: 11/04/2021 18:02   CT Cervical Spine Wo Contrast  Result Date: 11/04/2021 CLINICAL DATA:  Neck trauma (Age >= 65y) fall down 6 steps onto concrete with head injury. Neck pain. EXAM: CT CERVICAL SPINE WITHOUT CONTRAST TECHNIQUE: Multidetector CT imaging of the cervical spine was performed without intravenous contrast. Multiplanar CT image reconstructions were also generated. RADIATION DOSE REDUCTION: This exam was performed according to the departmental dose-optimization program which includes automated exposure control,  adjustment of the mA and/or kV according to patient size and/or use of iterative reconstruction technique. COMPARISON:  None Available. FINDINGS: Alignment: Straightening of the cervical spine. No facet subluxation. Skull base and vertebrae: Comminuted oblique mid dens fracture with 2 mm posterior displacement of the dominant superior fracture fragment. Nondisplaced bilateral posterior C1 arch fractures (series 2/image 20). No primary bone lesion or focal pathologic process. Soft tissues and spinal canal: No prevertebral edema. No visible canal hematoma. 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 Marked bilateral facet arthropathy. Mild degenerative foraminal stenosis on the right at C6-7. Upper chest: No acute abnormality. Other: Visualized mastoid air cells appear clear. No discrete thyroid nodules. No pathologically enlarged cervical nodes. IMPRESSION: 1. Comminuted oblique mid dens (type II) fracture with 2 mm posterior displacement of the dominant superior fracture fragment. 2. Nondisplaced bilateral posterior C1 arch fractures. 3. Mild-to-moderate multilevel cervical degenerative disc disease and marked bilateral facet  arthropathy. Critical Value/emergent results were called by telephone at the time of interpretation on 11/04/2021 at 6:00 pm to provider [facility] , who verbally acknowledged these results. Electronically Signed   By: Ilona Sorrel M.D.   On: 11/04/2021 18:02   CT HEAD WO CONTRAST (5MM)  Result Date: 11/04/2021 CLINICAL DATA:  Head trauma, minor (Age >= 65y) fall down 6 steps onto concrete with head injury. Neck pain. EXAM: CT HEAD WITHOUT CONTRAST TECHNIQUE: Contiguous axial images were obtained from the base of the skull through the vertex without intravenous contrast. RADIATION DOSE REDUCTION: This exam was performed according to the departmental dose-optimization program which includes automated exposure control, adjustment of the mA and/or kV according to patient size and/or use of iterative reconstruction technique. COMPARISON:  09/27/2016 head CT. FINDINGS: Brain: Generalized cerebral volume loss. No evidence of parenchymal hemorrhage or extra-axial fluid collection. No mass lesion, mass effect, or midline shift. No CT evidence of acute infarction. Nonspecific mild subcortical and periventricular white matter hypodensity, most in keeping with chronic small vessel ischemic change. No ventriculomegaly. Vascular: No acute abnormality. Skull: No evidence of calvarial fracture. Sinuses/Orbits: The visualized paranasal sinuses are essentially clear. Other: Nondisplaced bilateral posterior C1 arch fractures noted (series 3/image 1). The mastoid air cells are unopacified. IMPRESSION: 1. No evidence of acute intracranial abnormality. No evidence of calvarial fracture. 2. Nondisplaced bilateral posterior C1 arch fractures. Please see the separate concurrent cervical spine CT report for further details. 3. Generalized cerebral volume loss and mild chronic small vessel ischemic changes in the cerebral white matter. Electronically Signed   By: Ilona Sorrel M.D.   On: 11/04/2021 17:54     EKG: Pending  Assessment/Plan  This is a 81 y.o. female with a history of OA, BRCA, GERD, HTN, HLD now being admitted with:  #. Displaced dens fracture and posterior C1 arch fracture - Admit inpatient - Aspen collar - Bedrest for now, may sit up in bed per neurosurgery Dr. Catalina Pizza - Pain control - DVT px   #. Leukocytosis likely related to recent steroid use - Monitor CBC  #. H/O BRCA - Continue Arimidex  #. History of HTN - Continue Norvasc  #. History of HLD - Continue Lipitor  #. History of GERD - Continue Protonix for Prilosec  Admission status: Inpatient IV Fluids: HL Diet/Nutrition: Heart healthy, NPO after midnight Consults called: Neurosurgery  DVT Px: Heparin, SCDs and early ambulation. Code Status: Full Code  Disposition Plan: To be determined  All the records are reviewed and case discussed with ED provider.  Management plans discussed with the patient and/or family who express understanding and agree with plan of care.  Seymore Brodowski D.O. on 11/04/2021 at 8:23 PM CC: Primary care physician; Leone Haven, MD   11/04/2021, 8:23 PM

## 2021-11-04 NOTE — ED Provider Notes (Signed)
[facility] Provider Note    Event Date/Time   First MD Initiated Contact with Patient 11/04/21 1813     (approximate)   History   Fall   HPI  Theresa Reid is a 81 y.o. female with past medical history of hypertension hyperlipidemia GERD who lives independently presents with neck pain after a fall.  Patient was bending down to pick something up when she lost her balance fell to her right side and fell down 6 stairs.  She did not lose consciousness was unable to get up.  She complains of pain from the top of her skull down to her neck and in bilateral shoulders.  Also complaining of some right hip pain.  Has not tried to ambulate since.  Denies numbness tingling or new paresthesias she has chronic neuropathy but this is unchanged.  Denies headache nausea vomiting pain or difficulty breathing.    Past Medical History:  Diagnosis Date   Arthritis    Breast cancer ([place])    Diverticulitis    Family history of breast cancer    GERD (gastroesophageal reflux disease)    Hyperlipidemia    Hypertension    Personal history of chemotherapy    Personal history of radiation therapy     Patient Active Problem List   Diagnosis Date Noted   Pain in right shoulder 09/27/2021   Prediabetes 12/16/2020   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   Cataract 09/24/2018   Urge incontinence of urine 09/24/2018   Malignant neoplasm of right female breast ([place]) 06/07/2018   Family history of breast cancer    Dairy product intolerance 01/14/2018   Seborrheic keratosis 04/10/2017   Dysuria 02/19/2017   Visual disturbance 10/03/2016   GERD (gastroesophageal reflux disease) 09/14/2016   HLD (hyperlipidemia) 08/13/2015   Seborrheic dermatitis 07/01/2015   Hypertension 11/13/2014   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     Physical Exam  Triage Vital Signs: ED Triage Vitals [11/04/21 1725]  Enc Vitals Group     BP (!) 154/59     Pulse Rate 82     Resp  17     Temp 98 F (36.7 C)     Temp src      SpO2 94 %     Weight 196 lb (88.9 kg)     Height '5\' 5"'$  (1.651 m)     Head Circumference      Peak Flow      Pain Score 4     Pain Loc      Pain Edu?      Excl. in [place]?     Most recent vital signs: Vitals:   11/04/21 1725 11/04/21 1815  BP: (!) 154/59 (!) 160/64  Pulse: 82 66  Resp: 17 (!) 23  Temp: 98 F (36.7 C)   SpO2: 94% 97%     General: Awake, no distress.  CV:  Good peripheral perfusion.  Resp:  Normal effort.  Abd:  No distention.  Neuro:             Awake, Alert, Oriented x 3  Other:  5 out of 5 strength with elbow flexion, extension, grip, finger abduction, wrist extension Normal strength in bilateral extremities Positive range both hips without difficulty Mild tenderness over the right posterior hip  No signs of trauma to the head C-collar in place   ED Results / Procedures / Treatments  Labs (all labs ordered are listed, but only abnormal results are displayed) Labs Reviewed  COMPREHENSIVE METABOLIC PANEL - Abnormal; Notable for the following components:      Result Value   Glucose, Bld 200 (*)    All other components within normal limits  CBC WITH DIFFERENTIAL/PLATELET - Abnormal; Notable for the following components:   WBC 17.2 (*)    Neutro Abs 14.4 (*)    Monocytes Absolute 1.1 (*)    Abs Immature Granulocytes 0.16 (*)    All other components within normal limits  URINALYSIS, ROUTINE W REFLEX MICROSCOPIC     EKG     RADIOLOGY I reviewed and interpreted the CT scan of the brain which does not show any acute intracranial process    PROCEDURES:  Critical Care performed: No  Procedures  The patient is on the cardiac monitor to evaluate for evidence of arrhythmia and/or significant heart rate changes.   MEDICATIONS ORDERED IN ED: Medications  HYDROmorphone (DILAUDID) injection 0.5 mg (0.5 mg Intravenous Given 11/04/21 1840)     IMPRESSION / MDM / ASSESSMENT AND PLAN / ED COURSE  I  reviewed the triage vital signs and the nursing notes.                              Patient's presentation is most consistent with acute presentation with potential threat to life or bodily function.  Differential diagnosis includes, but is not limited to, cervical spine fracture, ligamentous injury, intracranial hemorrhage, hip fracture  The patient is an 81 year old female who presents with neck pain and hip pain after a fall.  It was a mechanical fall that she sustained fell down 6 stairs.  Complains of neck pain from the occiput down to the shoulders.  She also complains of right hip pain but is able to range the hip.  No headaches no neurologic symptoms.  CT head and C-spine were obtained from triage I received a call from radiology noting bilateral posterior arch fractures of C1 and an unstable dens fracture.  Patient does not have any neurologic deficits.  I discussed with neurosurgeon on-call Dr. Catalina Pizza who reviewed the images recommends MRI cervical spine and a CTA to rule out cervical artery injury.  He recommends admission to medicine and they will see the patient in morning decide whether operative management is indicated.  Patient is in a hard collar currently we will place an Aspen collar.  She is able to sit up.  She was given Dilaudid for pain control.  Of note she has a leukocytosis to 17 patient has been on 5 mg of prednisone for the last 4 days due to an arthritic flare.  She has no other infectious symptoms.  We will send urine and chest x-ray however.  BMP reassuring.  Will discuss with hospitalist for admission       FINAL CLINICAL IMPRESSION(S) / ED DIAGNOSES   Final diagnoses:  Closed odontoid fracture, initial encounter ([place])  Closed nondisplaced fracture of posterior arch of first cervical vertebra, initial encounter ([place])     Rx / DC Orders   ED Discharge Orders     None        Note:  This document was prepared using Dragon voice recognition software and  may include unintentional dictation errors.   Rada Hay, MD 11/04/21 1943

## 2021-11-05 DIAGNOSIS — S12100A Unspecified displaced fracture of second cervical vertebra, initial encounter for closed fracture: Secondary | ICD-10-CM | POA: Diagnosis not present

## 2021-11-05 LAB — BASIC METABOLIC PANEL
Anion gap: 10 (ref 5–15)
BUN: 8 mg/dL (ref 8–23)
CO2: 24 mmol/L (ref 22–32)
Calcium: 8.7 mg/dL — ABNORMAL LOW (ref 8.9–10.3)
Chloride: 105 mmol/L (ref 98–111)
Creatinine, Ser: 0.37 mg/dL — ABNORMAL LOW (ref 0.44–1.00)
GFR, Estimated: >60 mL/min (ref >60)
Glucose, Bld: 115 mg/dL — ABNORMAL HIGH (ref 70–99)
Potassium: 3 mmol/L — ABNORMAL LOW (ref 3.5–5.1)
Sodium: 139 mmol/L (ref 135–145)

## 2021-11-05 LAB — PROTIME-INR
INR: 1 (ref 0.8–1.2)
Prothrombin Time: 13.3 seconds (ref 11.4–15.2)

## 2021-11-05 LAB — CBC
HCT: 40 % (ref 36.0–46.0)
Hemoglobin: 13.2 g/dL (ref 12.0–15.0)
MCH: 29.6 pg (ref 26.0–34.0)
MCHC: 33 g/dL (ref 30.0–36.0)
MCV: 89.7 fL (ref 80.0–100.0)
Platelets: 204 10*3/uL (ref 150–400)
RBC: 4.46 MIL/uL (ref 3.87–5.11)
RDW: 14.6 % (ref 11.5–15.5)
WBC: 14.3 10*3/uL — ABNORMAL HIGH (ref 4.0–10.5)
nRBC: 0 % (ref 0.0–0.2)

## 2021-11-05 LAB — APTT: aPTT: 33 seconds (ref 24–36)

## 2021-11-05 MED ORDER — POTASSIUM CHLORIDE CRYS ER 20 MEQ PO TBCR
40.0000 meq | EXTENDED_RELEASE_TABLET | ORAL | Status: AC
  Administered 2021-11-05 (×3): 40 meq via ORAL
  Filled 2021-11-05 (×3): qty 2

## 2021-11-05 MED ORDER — MORPHINE SULFATE (PF) 2 MG/ML IV SOLN: 2.0000 mg | INTRAVENOUS | Status: DC | PRN

## 2021-11-05 MED ORDER — ALUM & MAG HYDROXIDE-SIMETH 200-200-20 MG/5ML PO SUSP
30.0000 mL | ORAL | Status: DC | PRN
  Administered 2021-11-05: 30 mL via ORAL
  Filled 2021-11-05: qty 30

## 2021-11-05 MED ORDER — GABAPENTIN 600 MG PO TABS
300.0000 mg | ORAL_TABLET | Freq: Three times a day (TID) | ORAL | Status: DC
  Administered 2021-11-05 – 2021-11-08 (×10): 300 mg via ORAL
  Filled 2021-11-05 (×10): qty 1

## 2021-11-05 NOTE — Plan of Care (Signed)

## 2021-11-05 NOTE — Progress Notes (Signed)
PROGRESS NOTE    DARRAH DREDGE  FGH:[id number] DOB: 12-28-1940 DOA: 11/04/2021 PCP: Leone Haven, MD    Brief Narrative:   81 y.o. female with a known history of OA, BRCA, GERD, HTN, HLD presents to the emergency department for evaluation of neck pain s/p fall.  Patient was in a usual state of health until today when she lost balance and fell down 6 stairs landing on her right hip.  .   Patient denies fevers/chills, weakness, dizziness, chest pain, shortness of breath, N/V/C/D, abdominal pain, dysuria/frequency, changes in mental status.    Of note patient has been taking prednisone for an arthritis flare recently.     Otherwise there has been no change in status. Patient has been taking medication as prescribed and there has been no recent change in medication or diet.  No recent antibiotics.  There has been no recent illness, hospitalizations, travel or sick contacts.       Assessment & Plan:   Principal Problem:   Dens fracture ([place])  C1 fracture Status post mechanical fall Secondary to mechanical fall.  Patient in [place] collar.  Per family member at bedside neurosurgery states that surgical evaluation will be considered on Monday. Plan: Continue Aspen collar Continue bedrest for now May sit up As needed pain control DVT prophylaxis  Leukocytosis Appears secondary to steroid use Monitor CBC  History of breast cancer Continue Arimidex  History hypertension PTA Norvasc  History hyperlipidemia Lipitor  GERD PPI  DVT prophylaxis: SQ heparin Code Status: Full Family Communication: Daughter at bedside Disposition Plan: Status is: Inpatient Remains inpatient appropriate because: C1 fracture.  Pending surgical plan   Level of care: Med-Surg  Consultants:  Neurosurgery  Procedures:  None  Antimicrobials: None   Subjective: Seen and examined.  Sitting in bed.  Suboptimal pain control.  Objective: Vitals:   11/04/21 2159 11/04/21 2222 11/05/21  0504 11/05/21 0808  BP:  (!) 168/63 139/61 (!) 154/65  Pulse:  67 82 77  Resp:  _0 Temp: 97.8 F (36.6 C) 97.7 F (36.5 C) 98 F (36.7 C) 97.6 F (36.4 C)  TempSrc: Oral     SpO2:  96% 98% 94%  Weight:      Height:        Intake/Output Summary (Last 24 hours) at 11/05/2021 1257 Last data filed at 11/05/2021 0506 Gross per 24 hour  Intake --  Output 500 ml  Net -500 ml   Filed Weights   11/04/21 1725  Weight: 88.9 kg    Examination:  General exam: NAD.  Aspen collar in place Respiratory system: Clear to auscultation. Respiratory effort normal. Cardiovascular system: S1-S2, RRR, no murmurs, no pedal edema Gastrointestinal system: Soft, NT/ND, normal bowel sounds Central nervous system: Alert and oriented. No focal neurological deficits. Extremities: Symmetric 5 x 5 power. Skin: No rashes, lesions or ulcers Psychiatry: Judgement and insight appear normal. Mood & affect appropriate.     Data Reviewed: I have personally reviewed following labs and imaging studies  CBC: Recent Labs  Lab 11/04/21 1728 11/05/21 0416  WBC 17.2* 14.3*  NEUTROABS 14.4*  --   HGB 13.2 13.2  HCT 41.0 40.0  MCV 91.1 89.7  PLT 188 678   Basic Metabolic Panel: Recent Labs  Lab 10/31/21 1022 11/04/21 1728 11/05/21 0416  NA 141 140 139  K 4.1 3.9 3.0*  CL 104 110 105  CO2 _1 GLUCOSE 91 200* 115*  BUN _2 CREATININE  0.53 0.64 0.37*  CALCIUM 9.3 9.0 8.7*   GFR: Estimated Creatinine Clearance: 59.7 mL/min (A) (by C-G formula based on SCr of 0.37 mg/dL (L)). Liver Function Tests: Recent Labs  Lab 10/31/21 1022 11/04/21 1728  AST 18 26  ALT 14 20  ALKPHOS 99 81  BILITOT 0.4 0.6  PROT 6.5 6.8  ALBUMIN 4.1 3.6   No results for input(s): "LIPASE", "AMYLASE" in the last 168 hours. No results for input(s): "AMMONIA" in the last 168 hours. Coagulation Profile: Recent Labs  Lab 11/05/21 0416  INR 1.0   Cardiac Enzymes: No results for input(s): "CKTOTAL",  "CKMB", "CKMBINDEX", "TROPONINI" in the last 168 hours. BNP (last 3 results) No results for input(s): "PROBNP" in the last 8760 hours. HbA1C: No results for input(s): "HGBA1C" in the last 72 hours. CBG: No results for input(s): "GLUCAP" in the last 168 hours. Lipid Profile: No results for input(s): "CHOL", "HDL", "LDLCALC", "TRIG", "CHOLHDL", "LDLDIRECT" in the last 72 hours. Thyroid Function Tests: No results for input(s): "TSH", "T4TOTAL", "FREET4", "T3FREE", "THYROIDAB" in the last 72 hours. Anemia Panel: No results for input(s): "VITAMINB12", "FOLATE", "FERRITIN", "TIBC", "IRON", "RETICCTPCT" in the last 72 hours. Sepsis Labs: No results for input(s): "PROCALCITON", "LATICACIDVEN" in the last 168 hours.  No results found for this or any previous visit (from the past 240 hour(s)).       Radiology Studies: MR Cervical Spine Wo Contrast  Result Date: 11/05/2021 CLINICAL DATA:  Initial evaluation for compression fracture. EXAM: MRI CERVICAL SPINE WITHOUT CONTRAST TECHNIQUE: Multiplanar, multisequence MR imaging of the cervical spine was performed. No intravenous contrast was administered. COMPARISON:  Prior CT from 11/04/2021. FINDINGS: Alignment: Mild straightening of the normal cervical lordosis. No interval listhesis or malalignment. Vertebrae: Previously identified acute oblique mildly displaced fracture extending through the dens again seen, relatively similar in position and alignment as compared to prior CT. Additional acute nondisplaced fractures involving the posterior arch of C1 grossly stable, better seen on prior CT. There are likely subtle acute compression fractures involving the superior endplates of T2, T3, and T4 with no more than minimal central height loss. No bony retropulsion. Otherwise, vertebral body height maintained with no other visible acute fracture. Underlying bone marrow signal intensity within normal limits. No discrete or worrisome osseous lesions. Mild  discogenic reactive endplate change present about the C6-7 interspace. Cord: Normal signal and morphology. No evidence for traumatic cord injury. Tectorial membrane, posterior longitudinal ligament, and ligamentum flavum appear intact. Focal signal abnormality at the level of the anterior longitudinal ligament at the level of the anterior arch of C1 could reflect mild ligamentous injury/strain (series 7, image 7). Posterior Fossa, vertebral arteries, paraspinal tissues: Visualized brain and posterior fossa within normal limits. Craniocervical junction within normal limits. Mild edema noted within the right posterior paraspinous soft tissues at the upper back at the levels of T1 through T3, likely associated mild soft tissue injury (series 7, image 2). Prevertebral edema related to the acute C1/C2 fractures seen extending from the clivus to C5. Normal flow voids seen within the vertebral arteries bilaterally. Disc levels: C1-2: Acute oblique type 2 dens fracture with slight posterior displacement. No significant stenosis at the craniocervical junction. C2-C3: Unremarkable. C3-C4: Mild disc bulge with uncovertebral hypertrophy. Bilateral facet hypertrophy. No significant spinal stenosis. Foramina remain patent. C4-C5: Mild disc bulge with bilateral uncovertebral spurring. Left-sided facet hypertrophy. No significant spinal stenosis. Mild right C5 foraminal narrowing. Left neural foramen remains patent. C5-C6: Mild disc bulge with uncovertebral spurring. Mild facet hypertrophy. No  significant spinal stenosis. Mild bilateral C6 foraminal stenosis. C6-C7: Degenerative intervertebral disc space narrowing with circumferential disc osteophyte complex. Flattening and partial effacement of the ventral thecal sac without significant spinal stenosis. Mild bilateral C7 foraminal narrowing. C7-T1: Negative interspace. Right-sided facet hypertrophy. No stenosis. Visualized upper thoracic spine demonstrates mild noncompressive disc  bulging at T1-2. No significant stenosis. IMPRESSION: 1. Acute oblique type 2 dens fracture with slight posterior displacement, with associated acute nondisplaced fractures through the posterior arch of C1. Findings are stable, and better characterized on prior CT. No significant stenosis at the craniocervical junction. 2. Subtle acute compression fractures involving the superior endplates of T2, T3, and T4 with no more than minimal central height loss. No bony retropulsion. 3. No evidence for traumatic cord injury. Suspected mild ligamentous injury/strain involving the anterior longitudinal ligament at the level of C1. Ligamentous structures otherwise intact. 4. Underlying mild for age multilevel cervical spondylosis without significant spinal stenosis. Mild bilateral C5 through C7 foraminal narrowing as above. Electronically Signed   By: Jeannine Boga M.D.   On: 11/05/2021 01:12   DG Chest Portable 1 View  Result Date: 11/04/2021 CLINICAL DATA:  Trauma, fell, upper lobe airspace disease on CT neck EXAM: PORTABLE CHEST 1 VIEW COMPARISON:  11/04/2021 FINDINGS: Single frontal view of the chest demonstrates stable left chest wall port. Cardiac silhouette is unremarkable. No acute airspace disease, effusion, or pneumothorax. The density seen on preceding neck CT or likely hypoventilatory. No acute bony abnormalities. IMPRESSION: 1. No acute intrathoracic process. Electronically Signed   By: Randa Ngo M.D.   On: 11/04/2021 21:03   CT Angio Neck W and/or Wo Contrast  Result Date: 11/04/2021 CLINICAL DATA:  Cervical spine fractures. Fall. EXAM: CT ANGIOGRAPHY NECK TECHNIQUE: Multidetector CT imaging of the neck was performed using the standard protocol during bolus administration of intravenous contrast. Multiplanar CT image reconstructions and MIPs were obtained to evaluate the vascular anatomy. Carotid stenosis measurements (when applicable) are obtained utilizing NASCET criteria, using the distal  internal carotid diameter as the denominator. RADIATION DOSE REDUCTION: This exam was performed according to the departmental dose-optimization program which includes automated exposure control, adjustment of the mA and/or kV according to patient size and/or use of iterative reconstruction technique. CONTRAST:  51m OMNIPAQUE IOHEXOL 350 MG/ML SOLN COMPARISON:  CT of the cervical spine 11/04/2021 FINDINGS: Aortic arch: Three-vessel or arch is present. Atherosclerotic calcifications are present. No aneurysm or stenosis. Right carotid system: The right common carotid artery is within normal limits. Minimal calcifications present bifurcation. Cervical right ICA is within normal limits. Left carotid system: The left common carotid artery is within normal limits. Bifurcation is unremarkable. The cervical left ICA is within normal limits. Vertebral arteries: The left vertebral artery is slightly dominant. Both vertebral arteries originate from the subclavian arteries without significant stenosis. No significant stenosis present in either vertebral artery in the neck. No acute vascular trauma is present in the setting of the C1 and C2 fractures. PICA origins are visualized and within normal limits. The vertebrobasilar junction and basilar artery are normal. Skeleton: Type 2 dens fracture see is and posterior C1 arch fracture again noted. Multilevel degenerative changes are present. Additional fractures. Other neck: Soft tissues the neck are otherwise unremarkable. Salivary glands are within normal limits. Thyroid is normal. No significant adenopathy is present. No focal mucosal or submucosal lesions are present. Upper chest: Patchy ground-glass attenuation is noted dependently in the upper lobes. Thoracic inlet is within normal limits. IMPRESSION: 1. No acute vascular trauma in  the setting of the C1 and C2 fractures. 2. Minimal atherosclerotic changes at the right carotid bifurcation without significant stenosis. 3.  Multilevel degenerative changes of the cervical spine. Electronically Signed   By: San Morelle M.D.   On: 11/04/2021 20:28   DG Hip Unilat W or Wo Pelvis 2-3 Views Right  Result Date: 11/04/2021 CLINICAL DATA:  Trauma, fall EXAM: DG HIP (WITH OR WITHOUT PELVIS) 2-3V RIGHT COMPARISON:  01/01/2017 FINDINGS: No fracture or dislocation is seen. Joint spaces in both hips appear symmetrical. Degenerative changes are noted in the visualized lower lumbar spine. No significant interval changes are noted. IMPRESSION: No fracture or dislocation is seen in pelvis and right hip. Electronically Signed   By: Elmer Picker M.D.   On: 11/04/2021 18:02   CT Cervical Spine Wo Contrast  Result Date: 11/04/2021 CLINICAL DATA:  Neck trauma (Age >= 65y) fall down 6 steps onto concrete with head injury. Neck pain. EXAM: CT CERVICAL SPINE WITHOUT CONTRAST TECHNIQUE: Multidetector CT imaging of the cervical spine was performed without intravenous contrast. Multiplanar CT image reconstructions were also generated. RADIATION DOSE REDUCTION: This exam was performed according to the departmental dose-optimization program which includes automated exposure control, adjustment of the mA and/or kV according to patient size and/or use of iterative reconstruction technique. COMPARISON:  None Available. FINDINGS: Alignment: Straightening of the cervical spine. No facet subluxation. Skull base and vertebrae: Comminuted oblique mid dens fracture with 2 mm posterior displacement of the dominant superior fracture fragment. Nondisplaced bilateral posterior C1 arch fractures (series 2/image 20). No primary bone lesion or focal pathologic process. Soft tissues and spinal canal: No prevertebral edema. No visible canal hematoma. 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 Marked bilateral facet arthropathy. Mild degenerative foraminal stenosis on the right at C6-7. Upper chest: No  acute abnormality. Other: Visualized mastoid air cells appear clear. No discrete thyroid nodules. No pathologically enlarged cervical nodes. IMPRESSION: 1. Comminuted oblique mid dens (type II) fracture with 2 mm posterior displacement of the dominant superior fracture fragment. 2. Nondisplaced bilateral posterior C1 arch fractures. 3. 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 Critical Value/emergent results were called by telephone at the time of interpretation on 11/04/2021 at 6:00 pm to provider [facility] , who verbally acknowledged these results. Electronically Signed   By: Ilona Sorrel M.D.   On: 11/04/2021 18:02   CT HEAD WO CONTRAST (5MM)  Result Date: 11/04/2021 CLINICAL DATA:  Head trauma, minor (Age >= 65y) fall down 6 steps onto concrete with head injury. Neck pain. EXAM: CT HEAD WITHOUT CONTRAST TECHNIQUE: Contiguous axial images were obtained from the base of the skull through the vertex without intravenous contrast. RADIATION DOSE REDUCTION: This exam was performed according to the departmental dose-optimization program which includes automated exposure control, adjustment of the mA and/or kV according to patient size and/or use of iterative reconstruction technique. COMPARISON:  09/27/2016 head CT. FINDINGS: Brain: Generalized cerebral volume loss. No evidence of parenchymal hemorrhage or extra-axial fluid collection. No mass lesion, mass effect, or midline shift. No CT evidence of acute infarction. Nonspecific mild subcortical and periventricular white matter hypodensity, most in keeping with chronic small vessel ischemic change. No ventriculomegaly. Vascular: No acute abnormality. Skull: No evidence of calvarial fracture. Sinuses/Orbits: The visualized paranasal sinuses are essentially clear. Other: Nondisplaced bilateral posterior C1 arch fractures noted (series 3/image 1). The mastoid air cells are unopacified. IMPRESSION: 1. No  evidence of acute intracranial abnormality. No evidence of  calvarial fracture. 2. Nondisplaced bilateral posterior C1 arch fractures. Please see the separate concurrent cervical spine CT report for further details. 3. Generalized cerebral volume loss and mild chronic small vessel ischemic changes in the cerebral white matter. Electronically Signed   By: Ilona Sorrel M.D.   On: 11/04/2021 17:54        Scheduled Meds:  amLODipine  5 mg Oral Daily   anastrozole  1 mg Oral Daily   atorvastatin  80 mg Oral q1800   gabapentin  300 mg Oral TID   heparin  5,000 Units Subcutaneous Q8H   oxybutynin  5 mg Oral BID   pantoprazole  40 mg Oral Daily   potassium chloride  40 mEq Oral Q2H   Continuous Infusions:   LOS: 1 day      Sidney Ace, MD [facility]   If 7PM-7AM, please contact night-coverage  11/05/2021, 12:57 PM

## 2021-11-05 NOTE — Consult Note (Signed)
Referring Physician:  No referring provider defined for this encounter.  Primary Physician:  Leone Haven, MD  Chief Complaint:  C2 dens fracture  History of Present Illness: Theresa Reid is a 81 y.o. female who presents with the chief complaint of neck pain after fall.  Found to have C2 dens fracture and questionable C1 ring fracture.  Neck pain.  No upper or lower extremity weakness.      Review of Systems:  A 10 point review of systems is negative, except for the pertinent positives and negatives detailed in the HPI.  Past Medical History: Past Medical History:  Diagnosis Date   Arthritis    Breast cancer ([place])    Diverticulitis    Family history of breast cancer    GERD (gastroesophageal reflux disease)    Hyperlipidemia    Hypertension    Personal history of chemotherapy    Personal history of radiation therapy     Past Surgical History: Past Surgical History:  Procedure Laterality Date   BREAST BIOPSY Right 05/28/2018   [place] bx, INVASIVE MAMMARY CARCINOMA WITH LOBULAR FEATURES   BREAST LUMPECTOMY Right 06/12/2018   [place], lobular features   BREAST LUMPECTOMY WITH SENTINEL LYMPH NODE BIOPSY Right 06/12/2018   Procedure: RIGHT BREAST WIDE EXCISION WITH SENTINEL LYMPH NODE BX;  Surgeon: Robert Bellow, MD;  Location: [facility] ORS;  Service: General;  Laterality: Right;   [place] Right 10/30/2018   Procedure: IRRIGATION AND DEBRIDEMENT RIGHT CHEST WALL WOUND;  Surgeon: Robert Bellow, MD;  Location: [facility] ORS;  Service: General;  Laterality: Right;   JOINT REPLACEMENT     MASTECTOMY Right    MASTECTOMY WITH AXILLARY LYMPH NODE DISSECTION Right 09/20/2018   Procedure: MASTECTOMY WITH AXILLARY LYMPH NODE DISSECTION RIGHT;  Surgeon: Robert Bellow, MD;  Location: [facility] ORS;  Service: General;  Laterality: Right;   ovaraian cyst removal Right    Cyst only (NOT OVARY)   PORTACATH PLACEMENT  Left 09/20/2018   Procedure: INSERTION PORT-A-CATH, LEFT;  Surgeon: Robert Bellow, MD;  Location: [facility] ORS;  Service: General;  Laterality: Left;   TOTAL KNEE ARTHROPLASTY Bilateral 05/05/2010    Problem List: Patient Active Problem List   Diagnosis Date Noted   Dens fracture ([place]) 11/04/2021   Pain in right shoulder 09/27/2021   Prediabetes 12/16/2020   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   Cataract 09/24/2018   Urge incontinence of urine 09/24/2018   Malignant neoplasm of right female breast ([place]) 06/07/2018   Family history of breast cancer    Dairy product intolerance 01/14/2018   Seborrheic keratosis 04/10/2017   Dysuria 02/19/2017   Visual disturbance 10/03/2016   GERD (gastroesophageal reflux disease) 09/14/2016   HLD (hyperlipidemia) 08/13/2015   Seborrheic dermatitis 07/01/2015   Hypertension 11/13/2014   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    Allergies: Allergies as of 11/04/2021   (No Known Allergies)    Medications: '@ENCMED'$ @  Social History: Social History   Tobacco Use   Smoking status: Never   Smokeless tobacco: Never  Vaping Use   Vaping Use: Never used  Substance Use Topics   Alcohol use: No    Alcohol/week: 0.0 standard drinks of alcohol   Drug use: No    Family Medical History: Family History  Problem Relation Age of Onset   Alcohol abuse Mother        deceased 74   Arthritis Mother  Diabetes Father    Breast cancer Maternal Aunt        dx 72s; deceased 87s   Breast cancer Maternal Aunt        dx 21s; deceased 78s   Arthritis Maternal Grandmother     Physical Examination: '@VITALWITHPAIN'$ @  General: Patient is well developed, well nourished, calm, collected, and in no apparent distress.  Psychiatric: Patient is non-anxious.  Head:  Pupils equal, round, and reactive to light.  ENT:  Oral mucosa appears well hydrated.  Neck:   Supple.  Full range of motion.  Respiratory: Patient is breathing without any  difficulty.  Extremities: No edema.  Vascular: Palpable pulses.  Skin:   On exposed skin, there are no abnormal skin lesions.  NEUROLOGICAL:  General: In no acute distress.   Awake, alert, oriented to person, place, and time.  Pupils equal round and reactive to light.  Facial tone is symmetric.  Tongue protrusion is midline.  There is no pronator drift.    Strength: Side Biceps Triceps Deltoid Interossei Grip Wrist Ext. Wrist Flex.  R '5 5 5 5 5 5 5  '$ L '5 5 5 5 5 5 5   '$ Side Iliopsoas Quads Hamstring PF DF EHL  R '5 5 5 5 5 5  '$ L '5 5 5 5 5 5   '$ Reflexes are 2+ and symmetric at the biceps, triceps, brachioradialis, patella and achilles.   Bilateral upper and lower extremity sensation is intact to light touch and pin prick.  Clonus is not present.  Toes are down-going.  Gait is not tested.  Hoffman's is absent.  Imaging: 1. Comminuted oblique mid dens (type II) fracture with 2 mm posterior displacement of the dominant superior fracture fragment. 2. Nondisplaced bilateral posterior C1 arch fractures. 3. 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  I have personally reviewed the images and agree with the above interpretation.    Assessment and Plan: Ms. Errickson is a pleasant 81 y.o. female with type 2/3 dens fracture.  I have discussed the condition with the patient, including showing the radiographs and discussing treatment options in layman's terms.  Thus, I have recommended the following:  1)  No acute surgical interventions. 2)  Collar at all times. 3)  Patient may benefit from surgical fixation but will require medical clearance and syncopal work up.    Thank you for involving me in the care of this patient. I will keep you apprised of the patient's progress.   This note was partially dictated using voice recognition software, so please excuse any errors that were not corrected.   Redge Gainer MD,  PhD

## 2021-11-06 DIAGNOSIS — S12100A Unspecified displaced fracture of second cervical vertebra, initial encounter for closed fracture: Secondary | ICD-10-CM | POA: Diagnosis not present

## 2021-11-06 LAB — CBC WITH DIFFERENTIAL/PLATELET
Abs Immature Granulocytes: 0.03 10*3/uL (ref 0.00–0.07)
Basophils Absolute: 0 10*3/uL (ref 0.0–0.1)
Basophils Relative: 0 %
Eosinophils Absolute: 0 10*3/uL (ref 0.0–0.5)
Eosinophils Relative: 0 %
HCT: 42.2 % (ref 36.0–46.0)
Hemoglobin: 13.8 g/dL (ref 12.0–15.0)
Immature Granulocytes: 0 %
Lymphocytes Relative: 12 %
Lymphs Abs: 1.3 10*3/uL (ref 0.7–4.0)
MCH: 29.1 pg (ref 26.0–34.0)
MCHC: 32.7 g/dL (ref 30.0–36.0)
MCV: 89 fL (ref 80.0–100.0)
Monocytes Absolute: 1 10*3/uL (ref 0.1–1.0)
Monocytes Relative: 10 %
Neutro Abs: 8.2 10*3/uL — ABNORMAL HIGH (ref 1.7–7.7)
Neutrophils Relative %: 78 %
Platelets: 211 10*3/uL (ref 150–400)
RBC: 4.74 MIL/uL (ref 3.87–5.11)
RDW: 14.5 % (ref 11.5–15.5)
WBC: 10.6 10*3/uL — ABNORMAL HIGH (ref 4.0–10.5)
nRBC: 0 % (ref 0.0–0.2)

## 2021-11-06 LAB — BASIC METABOLIC PANEL
Anion gap: 7 (ref 5–15)
BUN: 7 mg/dL — ABNORMAL LOW (ref 8–23)
CO2: 26 mmol/L (ref 22–32)
Calcium: 8.7 mg/dL — ABNORMAL LOW (ref 8.9–10.3)
Chloride: 100 mmol/L (ref 98–111)
Creatinine, Ser: 0.4 mg/dL — ABNORMAL LOW (ref 0.44–1.00)
GFR, Estimated: >60 mL/min (ref >60)
Glucose, Bld: 105 mg/dL — ABNORMAL HIGH (ref 70–99)
Potassium: 4.4 mmol/L (ref 3.5–5.1)
Sodium: 133 mmol/L — ABNORMAL LOW (ref 135–145)

## 2021-11-06 MED ORDER — ENOXAPARIN SODIUM 40 MG/0.4ML IJ SOSY
40.0000 mg | PREFILLED_SYRINGE | Freq: Every day | INTRAMUSCULAR | Status: DC
  Administered 2021-11-06 – 2021-11-07 (×2): 40 mg via SUBCUTANEOUS
  Filled 2021-11-06 (×2): qty 0.4

## 2021-11-06 NOTE — Plan of Care (Signed)
  Problem: Activity: Goal: Risk for activity intolerance will decrease Outcome: Progressing   Problem: Nutrition: Goal: Adequate nutrition will be maintained Outcome: Progressing   Problem: Coping: Goal: Level of anxiety will decrease Outcome: Progressing   Problem: Pain Managment: Goal: General experience of comfort will improve Outcome: Progressing   Problem: Skin Integrity: Goal: Risk for impaired skin integrity will decrease Outcome: Progressing

## 2021-11-06 NOTE — Plan of Care (Signed)

## 2021-11-06 NOTE — TOC CM/SW Note (Signed)
  Transition of Care [facility]) Screening Note   Patient Details  Name: [name] Date of Birth: 31-Dec-1940   Transition of Care [facility]) CM/SW Contact:    Candie Chroman, LCSW Phone Number: 11/06/2021, 3:23 PM    Transition of Care Department [facility]) has reviewed patient and no TOC needs have been identified at this time. We will continue to monitor patient advancement through interdisciplinary progression rounds. If new patient transition needs arise, please place a TOC consult.

## 2021-11-06 NOTE — Progress Notes (Signed)
PROGRESS NOTE    [name]  VZS:[id number] DOB: Jul 20, 1940 DOA: 11/04/2021 PCP: Leone Haven, MD    Brief Narrative:   81 y.o. female with a known history of OA, BRCA, GERD, HTN, HLD presents to the emergency department for evaluation of neck pain s/p fall.  Patient was in a usual state of health until today when she lost balance and fell down 6 stairs landing on her right hip.  .   Patient denies fevers/chills, weakness, dizziness, chest pain, shortness of breath, N/V/C/D, abdominal pain, dysuria/frequency, changes in mental status.    Of note patient has been taking prednisone for an arthritis flare recently.     Otherwise there has been no change in status. Patient has been taking medication as prescribed and there has been no recent change in medication or diet.  No recent antibiotics.  There has been no recent illness, hospitalizations, travel or sick contacts.    Multimodal pain regimen initiated on 7/15 with good result.  Pain better controlled on 7/16.  Per last neurosurgery note no plans for inpatient surgical intervention but will continue to evaluate     Assessment & Plan:   Principal Problem:   Dens fracture ([place])  C1 fracture Status post mechanical fall Secondary to mechanical fall.  Patient in [place] collar.  Per family member at bedside neurosurgery states that surgical evaluation will be considered on Monday. Plan: Continue Aspen collar at all times Continue bedrest for now May sit up in bed Multimodal pain control DVT prophylaxis Neurosurgical follow-up  Leukocytosis Appears secondary to steroid use Monitor CBC Monitor vitals and fever curve  History of breast cancer Continue Arimidex  History hypertension PTA Norvasc  History hyperlipidemia Lipitor  GERD PPI  DVT prophylaxis: SQ heparin Code Status: Full Family Communication: Daughter at bedside 7/15 Disposition Plan: Status is: Inpatient Remains inpatient appropriate because: C1  fracture.  Ensure pain control   Level of care: Med-Surg  Consultants:  Neurosurgery  Procedures:  None  Antimicrobials: None   Subjective: Seen and examined.  Sitting in bed.  Improved pain control over interval  Objective: Vitals:   11/05/21 0808 11/05/21 2051 11/06/21 0402 11/06/21 0754  BP: (!) 154/65 (!) 152/58 (!) 152/59 (!) 159/63  Pulse: 77 70 73 71  Resp: _0 Temp: 97.6 F (36.4 C) 98.7 F (37.1 C) (!) 97.4 F (36.3 C) (!) 97.5 F (36.4 C)  TempSrc:   Oral   SpO2: 94% 92% 93% 93%  Weight:      Height:        Intake/Output Summary (Last 24 hours) at 11/06/2021 1016 Last data filed at 11/06/2021 0900 Gross per 24 hour  Intake 0 ml  Output 1025 ml  Net -1025 ml   Filed Weights   11/04/21 1725  Weight: 88.9 kg    Examination:  General exam: No acute distress.  C-collar in place Respiratory system: Lungs clear.  Normal work of breathing.  Room air Cardiovascular system: S1-S2, RRR, no murmurs, no pedal edema Gastrointestinal system: Soft, NT/ND, normal bowel sounds Central nervous system: Alert and oriented. No focal neurological deficits. Extremities: Symmetric 5 x 5 power. Skin: No rashes, lesions or ulcers Psychiatry: Judgement and insight appear normal. Mood & affect appropriate.     Data Reviewed: I have personally reviewed following labs and imaging studies  CBC: Recent Labs  Lab 11/04/21 1728 11/05/21 0416 11/06/21 0829  WBC 17.2* 14.3* 10.6*  NEUTROABS 14.4*  --  8.2*  HGB 13.2  13.2 13.8  HCT 41.0 40.0 42.2  MCV 91.1 89.7 89.0  PLT 188 204 364   Basic Metabolic Panel: Recent Labs  Lab 10/31/21 1022 11/04/21 1728 11/05/21 0416 11/06/21 0829  NA 141 140 139 133*  K 4.1 3.9 3.0* 4.4  CL 104 110 105 100  CO2 _0 GLUCOSE 91 200* 115* 105*  BUN _1 7*  CREATININE 0.53 0.64 0.37* 0.40*  CALCIUM 9.3 9.0 8.7* 8.7*   GFR: Estimated Creatinine Clearance: 59.7 mL/min (A) (by C-G formula based on SCr of 0.4  mg/dL (L)). Liver Function Tests: Recent Labs  Lab 10/31/21 1022 11/04/21 1728  AST 18 26  ALT 14 20  ALKPHOS 99 81  BILITOT 0.4 0.6  PROT 6.5 6.8  ALBUMIN 4.1 3.6   No results for input(s): "LIPASE", "AMYLASE" in the last 168 hours. No results for input(s): "AMMONIA" in the last 168 hours. Coagulation Profile: Recent Labs  Lab 11/05/21 0416  INR 1.0   Cardiac Enzymes: No results for input(s): "CKTOTAL", "CKMB", "CKMBINDEX", "TROPONINI" in the last 168 hours. BNP (last 3 results) No results for input(s): "PROBNP" in the last 8760 hours. HbA1C: No results for input(s): "HGBA1C" in the last 72 hours. CBG: No results for input(s): "GLUCAP" in the last 168 hours. Lipid Profile: No results for input(s): "CHOL", "HDL", "LDLCALC", "TRIG", "CHOLHDL", "LDLDIRECT" in the last 72 hours. Thyroid Function Tests: No results for input(s): "TSH", "T4TOTAL", "FREET4", "T3FREE", "THYROIDAB" in the last 72 hours. Anemia Panel: No results for input(s): "VITAMINB12", "FOLATE", "FERRITIN", "TIBC", "IRON", "RETICCTPCT" in the last 72 hours. Sepsis Labs: No results for input(s): "PROCALCITON", "LATICACIDVEN" in the last 168 hours.  No results found for this or any previous visit (from the past 240 hour(s)).       Radiology Studies: MR Cervical Spine Wo Contrast  Result Date: 11/05/2021 CLINICAL DATA:  Initial evaluation for compression fracture. EXAM: MRI CERVICAL SPINE WITHOUT CONTRAST TECHNIQUE: Multiplanar, multisequence MR imaging of the cervical spine was performed. No intravenous contrast was administered. COMPARISON:  Prior CT from 11/04/2021. FINDINGS: Alignment: Mild straightening of the normal cervical lordosis. No interval listhesis or malalignment. Vertebrae: Previously identified acute oblique mildly displaced fracture extending through the dens again seen, relatively similar in position and alignment as compared to prior CT. Additional acute nondisplaced fractures involving the  posterior arch of C1 grossly stable, better seen on prior CT. There are likely subtle acute compression fractures involving the superior endplates of T2, T3, and T4 with no more than minimal central height loss. No bony retropulsion. Otherwise, vertebral body height maintained with no other visible acute fracture. Underlying bone marrow signal intensity within normal limits. No discrete or worrisome osseous lesions. Mild discogenic reactive endplate change present about the C6-7 interspace. Cord: Normal signal and morphology. No evidence for traumatic cord injury. Tectorial membrane, posterior longitudinal ligament, and ligamentum flavum appear intact. Focal signal abnormality at the level of the anterior longitudinal ligament at the level of the anterior arch of C1 could reflect mild ligamentous injury/strain (series 7, image 7). Posterior Fossa, vertebral arteries, paraspinal tissues: Visualized brain and posterior fossa within normal limits. Craniocervical junction within normal limits. Mild edema noted within the right posterior paraspinous soft tissues at the upper back at the levels of T1 through T3, likely associated mild soft tissue injury (series 7, image 2). Prevertebral edema related to the acute C1/C2 fractures seen extending from the clivus to C5. Normal flow voids seen within the vertebral arteries bilaterally. Disc  levels: C1-2: Acute oblique type 2 dens fracture with slight posterior displacement. No significant stenosis at the craniocervical junction. C2-C3: Unremarkable. C3-C4: Mild disc bulge with uncovertebral hypertrophy. Bilateral facet hypertrophy. No significant spinal stenosis. Foramina remain patent. C4-C5: Mild disc bulge with bilateral uncovertebral spurring. Left-sided facet hypertrophy. No significant spinal stenosis. Mild right C5 foraminal narrowing. Left neural foramen remains patent. C5-C6: Mild disc bulge with uncovertebral spurring. Mild facet hypertrophy. No significant spinal  stenosis. Mild bilateral C6 foraminal stenosis. C6-C7: Degenerative intervertebral disc space narrowing with circumferential disc osteophyte complex. Flattening and partial effacement of the ventral thecal sac without significant spinal stenosis. Mild bilateral C7 foraminal narrowing. C7-T1: Negative interspace. Right-sided facet hypertrophy. No stenosis. Visualized upper thoracic spine demonstrates mild noncompressive disc bulging at T1-2. No significant stenosis. IMPRESSION: 1. Acute oblique type 2 dens fracture with slight posterior displacement, with associated acute nondisplaced fractures through the posterior arch of C1. Findings are stable, and better characterized on prior CT. No significant stenosis at the craniocervical junction. 2. Subtle acute compression fractures involving the superior endplates of T2, T3, and T4 with no more than minimal central height loss. No bony retropulsion. 3. No evidence for traumatic cord injury. Suspected mild ligamentous injury/strain involving the anterior longitudinal ligament at the level of C1. Ligamentous structures otherwise intact. 4. Underlying mild for age multilevel cervical spondylosis without significant spinal stenosis. Mild bilateral C5 through C7 foraminal narrowing as above. Electronically Signed   By: Jeannine Boga M.D.   On: 11/05/2021 01:12   DG Chest Portable 1 View  Result Date: 11/04/2021 CLINICAL DATA:  Trauma, fell, upper lobe airspace disease on CT neck EXAM: PORTABLE CHEST 1 VIEW COMPARISON:  11/04/2021 FINDINGS: Single frontal view of the chest demonstrates stable left chest wall port. Cardiac silhouette is unremarkable. No acute airspace disease, effusion, or pneumothorax. The density seen on preceding neck CT or likely hypoventilatory. No acute bony abnormalities. IMPRESSION: 1. No acute intrathoracic process. Electronically Signed   By: Randa Ngo M.D.   On: 11/04/2021 21:03   CT Angio Neck W and/or Wo Contrast  Result Date:  11/04/2021 CLINICAL DATA:  Cervical spine fractures. Fall. EXAM: CT ANGIOGRAPHY NECK TECHNIQUE: Multidetector CT imaging of the neck was performed using the standard protocol during bolus administration of intravenous contrast. Multiplanar CT image reconstructions and MIPs were obtained to evaluate the vascular anatomy. Carotid stenosis measurements (when applicable) are obtained utilizing NASCET criteria, using the distal internal carotid diameter as the denominator. RADIATION DOSE REDUCTION: This exam was performed according to the departmental dose-optimization program which includes automated exposure control, adjustment of the mA and/or kV according to patient size and/or use of iterative reconstruction technique. CONTRAST:  49m OMNIPAQUE IOHEXOL 350 MG/ML SOLN COMPARISON:  CT of the cervical spine 11/04/2021 FINDINGS: Aortic arch: Three-vessel or arch is present. Atherosclerotic calcifications are present. No aneurysm or stenosis. Right carotid system: The right common carotid artery is within normal limits. Minimal calcifications present bifurcation. Cervical right ICA is within normal limits. Left carotid system: The left common carotid artery is within normal limits. Bifurcation is unremarkable. The cervical left ICA is within normal limits. Vertebral arteries: The left vertebral artery is slightly dominant. Both vertebral arteries originate from the subclavian arteries without significant stenosis. No significant stenosis present in either vertebral artery in the neck. No acute vascular trauma is present in the setting of the C1 and C2 fractures. PICA origins are visualized and within normal limits. The vertebrobasilar junction and basilar artery are normal. Skeleton: Type 2  dens fracture see is and posterior C1 arch fracture again noted. Multilevel degenerative changes are present. Additional fractures. Other neck: Soft tissues the neck are otherwise unremarkable. Salivary glands are within normal  limits. Thyroid is normal. No significant adenopathy is present. No focal mucosal or submucosal lesions are present. Upper chest: Patchy ground-glass attenuation is noted dependently in the upper lobes. Thoracic inlet is within normal limits. IMPRESSION: 1. No acute vascular trauma in the setting of the C1 and C2 fractures. 2. Minimal atherosclerotic changes at the right carotid bifurcation without significant stenosis. 3. Multilevel degenerative changes of the cervical spine. Electronically Signed   By: San Morelle M.D.   On: 11/04/2021 20:28   DG Hip Unilat W or Wo Pelvis 2-3 Views Right  Result Date: 11/04/2021 CLINICAL DATA:  Trauma, fall EXAM: DG HIP (WITH OR WITHOUT PELVIS) 2-3V RIGHT COMPARISON:  01/01/2017 FINDINGS: No fracture or dislocation is seen. Joint spaces in both hips appear symmetrical. Degenerative changes are noted in the visualized lower lumbar spine. No significant interval changes are noted. IMPRESSION: No fracture or dislocation is seen in pelvis and right hip. Electronically Signed   By: Elmer Picker M.D.   On: 11/04/2021 18:02   CT Cervical Spine Wo Contrast  Result Date: 11/04/2021 CLINICAL DATA:  Neck trauma (Age >= 65y) fall down 6 steps onto concrete with head injury. Neck pain. EXAM: CT CERVICAL SPINE WITHOUT CONTRAST TECHNIQUE: Multidetector CT imaging of the cervical spine was performed without intravenous contrast. Multiplanar CT image reconstructions were also generated. RADIATION DOSE REDUCTION: This exam was performed according to the departmental dose-optimization program which includes automated exposure control, adjustment of the mA and/or kV according to patient size and/or use of iterative reconstruction technique. COMPARISON:  None Available. FINDINGS: Alignment: Straightening of the cervical spine. No facet subluxation. Skull base and vertebrae: Comminuted oblique mid dens fracture with 2 mm posterior displacement of the dominant superior fracture  fragment. Nondisplaced bilateral posterior C1 arch fractures (series 2/image 20). No primary bone lesion or focal pathologic process. Soft tissues and spinal canal: No prevertebral edema. No visible canal hematoma. 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 Marked bilateral facet arthropathy. Mild degenerative foraminal stenosis on the right at C6-7. Upper chest: No acute abnormality. Other: Visualized mastoid air cells appear clear. No discrete thyroid nodules. No pathologically enlarged cervical nodes. IMPRESSION: 1. Comminuted oblique mid dens (type II) fracture with 2 mm posterior displacement of the dominant superior fracture fragment. 2. Nondisplaced bilateral posterior C1 arch fractures. 3. 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 Critical Value/emergent results were called by telephone at the time of interpretation on 11/04/2021 at 6:00 pm to provider [facility] , who verbally acknowledged these results. Electronically Signed   By: Ilona Sorrel M.D.   On: 11/04/2021 18:02   CT HEAD WO CONTRAST (5MM)  Result Date: 11/04/2021 CLINICAL DATA:  Head trauma, minor (Age >= 65y) fall down 6 steps onto concrete with head injury. Neck pain. EXAM: CT HEAD WITHOUT CONTRAST TECHNIQUE: Contiguous axial images were obtained from the base of the skull through the vertex without intravenous contrast. RADIATION DOSE REDUCTION: This exam was performed according to the departmental dose-optimization program which includes automated exposure control, adjustment of the mA and/or kV according to patient size and/or use of iterative reconstruction technique. COMPARISON:  09/27/2016 head CT. FINDINGS: Brain: Generalized cerebral volume loss. No evidence of parenchymal hemorrhage or extra-axial fluid collection. No mass lesion, mass effect, or  midline shift. No CT evidence of acute infarction. Nonspecific mild  subcortical and periventricular white matter hypodensity, most in keeping with chronic small vessel ischemic change. No ventriculomegaly. Vascular: No acute abnormality. Skull: No evidence of calvarial fracture. Sinuses/Orbits: The visualized paranasal sinuses are essentially clear. Other: Nondisplaced bilateral posterior C1 arch fractures noted (series 3/image 1). The mastoid air cells are unopacified. IMPRESSION: 1. No evidence of acute intracranial abnormality. No evidence of calvarial fracture. 2. Nondisplaced bilateral posterior C1 arch fractures. Please see the separate concurrent cervical spine CT report for further details. 3. Generalized cerebral volume loss and mild chronic small vessel ischemic changes in the cerebral white matter. Electronically Signed   By: Ilona Sorrel M.D.   On: 11/04/2021 17:54        Scheduled Meds:  amLODipine  5 mg Oral Daily   anastrozole  1 mg Oral Daily   atorvastatin  80 mg Oral q1800   gabapentin  300 mg Oral TID   heparin  5,000 Units Subcutaneous Q8H   oxybutynin  5 mg Oral BID   pantoprazole  40 mg Oral Daily   Continuous Infusions:   LOS: 2 days      Sidney Ace, MD [facility]   If 7PM-7AM, please contact night-coverage  11/06/2021, 10:16 AM

## 2021-11-07 ENCOUNTER — Telehealth: Payer: Self-pay

## 2021-11-07 ENCOUNTER — Ambulatory Visit: Payer: Medicare Other | Admitting: Occupational Therapy

## 2021-11-07 DIAGNOSIS — M532X2 Spinal instabilities, cervical region: Secondary | ICD-10-CM

## 2021-11-07 DIAGNOSIS — S12111A Posterior displaced Type II dens fracture, initial encounter for closed fracture: Secondary | ICD-10-CM

## 2021-11-07 DIAGNOSIS — S12111K Posterior displaced Type II dens fracture, subsequent encounter for fracture with nonunion: Secondary | ICD-10-CM

## 2021-11-07 DIAGNOSIS — S12100A Unspecified displaced fracture of second cervical vertebra, initial encounter for closed fracture: Secondary | ICD-10-CM | POA: Diagnosis not present

## 2021-11-07 LAB — BASIC METABOLIC PANEL
Anion gap: 7 (ref 5–15)
BUN: 10 mg/dL (ref 8–23)
CO2: 26 mmol/L (ref 22–32)
Calcium: 8.5 mg/dL — ABNORMAL LOW (ref 8.9–10.3)
Chloride: 102 mmol/L (ref 98–111)
Creatinine, Ser: 0.44 mg/dL (ref 0.44–1.00)
GFR, Estimated: >60 mL/min (ref >60)
Glucose, Bld: 105 mg/dL — ABNORMAL HIGH (ref 70–99)
Potassium: 3.8 mmol/L (ref 3.5–5.1)
Sodium: 135 mmol/L (ref 135–145)

## 2021-11-07 NOTE — Plan of Care (Signed)

## 2021-11-07 NOTE — Progress Notes (Signed)
PROGRESS NOTE    Theresa Reid  VEH:[id number] DOB: Nov 27, 1940 DOA: 11/04/2021 PCP: Leone Haven, MD    Brief Narrative:   81 y.o. female with a known history of OA, BRCA, GERD, HTN, HLD presents to the emergency department for evaluation of neck pain s/p fall.  Patient was in a usual state of health until today when she lost balance and fell down 6 stairs landing on her right hip.  .   Patient denies fevers/chills, weakness, dizziness, chest pain, shortness of breath, N/V/C/D, abdominal pain, dysuria/frequency, changes in mental status.    Of note patient has been taking prednisone for an arthritis flare recently.     Otherwise there has been no change in status. Patient has been taking medication as prescribed and there has been no recent change in medication or diet.  No recent antibiotics.  There has been no recent illness, hospitalizations, travel or sick contacts.    Multimodal pain regimen initiated on 7/15 with good result.  Pain better controlled on 7/16.  Per last neurosurgery note no plans for inpatient surgical intervention but will continue to evaluate  Case discussed with neurosurgery on 7/17.  Images reviewed.  Neurosurgery spoke to patient and daughter at bedside.  Will plan for surgical fixation as outpatient.  Okay for discharge from neurosurgical standpoint.  We will attempt to optimize pain control over the next 24 hours, request therapy evaluations and discharge home on 7/18     Assessment & Plan:   Principal Problem:   Dens fracture ([place])  C1 fracture Status post mechanical fall Secondary to mechanical fall.  Patient in [place] collar.  Per family member at bedside neurosurgery states that surgical evaluation will be considered on Monday. Plan: Continue [place] collar at all times DC bedrest May remove collar to sleep and eat otherwise wear at all times Multimodal pain control, DC IV narcotics pump, optimize p.o. regimen DVT prophylaxis Outpatient  neurosurgery follow-up  Leukocytosis Appears secondary to steroid use Monitor CBC Monitor vitals and fever curve  History of breast cancer Continue Arimidex  History hypertension PTA Norvasc  History hyperlipidemia Lipitor  GERD PPI  DVT prophylaxis: SQ heparin Code Status: Full Family Communication: Daughter at bedside 7/15, 7/17 Disposition Plan: Status is: Inpatient Remains inpatient appropriate because: C1-C2 fracture.  Optimize pain control.  Request therapy evaluations.  Anticipated date of discharge 7/18.   Level of care: Med-Surg  Consultants:  Neurosurgery  Procedures:  None  Antimicrobials: None   Subjective: Seen and examined.  Sitting in bed.  Improved pain control over interval  Objective: Vitals:   11/06/21 0754 11/06/21 1513 11/06/21 1943 11/07/21 0445  BP: (!) 159/63 (!) 152/69 (!) 144/62 (!) 147/60  Pulse: 71 74 80 73  Resp: '15 16 20 20  ' Temp: (!) 97.5 F (36.4 C) 97.7 F (36.5 C) 98.9 F (37.2 C) 98.8 F (37.1 C)  TempSrc:  Oral    SpO2: 93% 95% 93% 92%  Weight:      Height:        Intake/Output Summary (Last 24 hours) at 11/07/2021 1041 Last data filed at 11/07/2021 0500 Gross per 24 hour  Intake 600 ml  Output 600 ml  Net 0 ml   Filed Weights   11/04/21 1725  Weight: 88.9 kg    Examination:  General exam: NAD.  Resting comfortably in bed Respiratory system: Lungs clear.  Normal work of breathing.  Room air Cardiovascular system: S1-S2, RRR, no murmurs, no pedal edema Gastrointestinal system: Soft, NT/ND,  normal bowel sounds Central nervous system: Alert and oriented. No focal neurological deficits. Extremities: Symmetric 5 x 5 power. Skin: No rashes, lesions or ulcers Psychiatry: Judgement and insight appear normal. Mood & affect appropriate.     Data Reviewed: I have personally reviewed following labs and imaging studies  CBC: Recent Labs  Lab 11/04/21 1728 11/05/21 0416 11/06/21 0829  WBC 17.2* 14.3* 10.6*   NEUTROABS 14.4*  --  8.2*  HGB 13.2 13.2 13.8  HCT 41.0 40.0 42.2  MCV 91.1 89.7 89.0  PLT 188 204 720   Basic Metabolic Panel: Recent Labs  Lab 11/04/21 1728 11/05/21 0416 11/06/21 0829 11/07/21 0756  NA 140 139 133* 135  K 3.9 3.0* 4.4 3.8  CL 110 105 100 102  CO2 '25 24 26 26  ' GLUCOSE 200* 115* 105* 105*  BUN 15 8 7* 10  CREATININE 0.64 0.37* 0.40* 0.44  CALCIUM 9.0 8.7* 8.7* 8.5*   GFR: Estimated Creatinine Clearance: 59.7 mL/min (by C-G formula based on SCr of 0.44 mg/dL). Liver Function Tests: Recent Labs  Lab 11/04/21 1728  AST 26  ALT 20  ALKPHOS 81  BILITOT 0.6  PROT 6.8  ALBUMIN 3.6   No results for input(s): "LIPASE", "AMYLASE" in the last 168 hours. No results for input(s): "AMMONIA" in the last 168 hours. Coagulation Profile: Recent Labs  Lab 11/05/21 0416  INR 1.0   Cardiac Enzymes: No results for input(s): "CKTOTAL", "CKMB", "CKMBINDEX", "TROPONINI" in the last 168 hours. BNP (last 3 results) No results for input(s): "PROBNP" in the last 8760 hours. HbA1C: No results for input(s): "HGBA1C" in the last 72 hours. CBG: No results for input(s): "GLUCAP" in the last 168 hours. Lipid Profile: No results for input(s): "CHOL", "HDL", "LDLCALC", "TRIG", "CHOLHDL", "LDLDIRECT" in the last 72 hours. Thyroid Function Tests: No results for input(s): "TSH", "T4TOTAL", "FREET4", "T3FREE", "THYROIDAB" in the last 72 hours. Anemia Panel: No results for input(s): "VITAMINB12", "FOLATE", "FERRITIN", "TIBC", "IRON", "RETICCTPCT" in the last 72 hours. Sepsis Labs: No results for input(s): "PROCALCITON", "LATICACIDVEN" in the last 168 hours.  No results found for this or any previous visit (from the past 240 hour(s)).       Radiology Studies: No results found.      Scheduled Meds:  amLODipine  5 mg Oral Daily   anastrozole  1 mg Oral Daily   atorvastatin  80 mg Oral q1800   enoxaparin (LOVENOX) injection  40 mg Subcutaneous QHS   gabapentin  300  mg Oral TID   oxybutynin  5 mg Oral BID   pantoprazole  40 mg Oral Daily   Continuous Infusions:   LOS: 3 days      Sidney Ace, MD [facility]   If 7PM-7AM, please contact night-coverage  11/07/2021, 10:41 AM

## 2021-11-07 NOTE — Telephone Encounter (Signed)
-----   Message from Meade Maw, MD sent at 11/07/2021  7:50 AM EDT ----- Juluis Mire for this patient either Aug 7 or Aug 9  C1-2 posterior fusion with ORIF C2 fracture Globus implants  [place] bed, [place]  No monitoring  [id number], 22840, 22326, [phone], [id number] ([employer] fiber)  Thanks, [employer]

## 2021-11-07 NOTE — H&P (View-Only) (Signed)
Referring Physician:  No referring provider defined for this encounter.  Primary Physician:  Leone Haven, MD  History of Present Illness: 11/07/2021 Theresa Reid is here today with a chief complaint of C2 and C1 fractures. She had a fall.  Her history is well reviewed in the consultation note from July 15 by Dr. Dava Najjar.  He asked that I review her case for possible surgery.  She has continued neck pain but no neurologic complaints otherwise.  Review of Systems:  A 10 point review of systems is negative, except for the pertinent positives and negatives detailed in the HPI.  Past Medical History: Past Medical History:  Diagnosis Date   Arthritis    Breast cancer ([facility])    Diverticulitis    Family history of breast cancer    GERD (gastroesophageal reflux disease)    Hyperlipidemia    Hypertension    Personal history of chemotherapy    Personal history of radiation therapy     Past Surgical History: Past Surgical History:  Procedure Laterality Date   BREAST BIOPSY Right 05/28/2018   [place] bx, INVASIVE MAMMARY CARCINOMA WITH LOBULAR FEATURES   BREAST LUMPECTOMY Right 06/12/2018   [place], lobular features   BREAST LUMPECTOMY WITH SENTINEL LYMPH NODE BIOPSY Right 06/12/2018   Procedure: RIGHT BREAST WIDE EXCISION WITH SENTINEL LYMPH NODE BX;  Surgeon: Robert Bellow, MD;  Location: [facility] ORS;  Service: General;  Laterality: Right;   [place] Right 10/30/2018   Procedure: IRRIGATION AND DEBRIDEMENT RIGHT CHEST WALL WOUND;  Surgeon: Robert Bellow, MD;  Location: [facility] ORS;  Service: General;  Laterality: Right;   JOINT REPLACEMENT     MASTECTOMY Right    MASTECTOMY WITH AXILLARY LYMPH NODE DISSECTION Right 09/20/2018   Procedure: MASTECTOMY WITH AXILLARY LYMPH NODE DISSECTION RIGHT;  Surgeon: Robert Bellow, MD;  Location: [facility] ORS;  Service: General;  Laterality: Right;   ovaraian cyst  removal Right    Cyst only (NOT OVARY)   PORTACATH PLACEMENT Left 09/20/2018   Procedure: INSERTION PORT-A-CATH, LEFT;  Surgeon: Robert Bellow, MD;  Location: [facility] ORS;  Service: General;  Laterality: Left;   TOTAL KNEE ARTHROPLASTY Bilateral 05/05/2010    Allergies: Allergies as of 11/04/2021   (No Known Allergies)    Medications: Current Meds  Medication Sig   amLODipine (NORVASC) 5 MG tablet TAKE 1 TABLET BY MOUTH  DAILY   anastrozole (ARIMIDEX) 1 MG tablet TAKE 1 TABLET BY MOUTH  DAILY DO NOT START UNTIL  COMPLETED RADIATION IN JAN  2021   atorvastatin (LIPITOR) 80 MG tablet TAKE 1 TABLET BY MOUTH  DAILY   CALCIUM-VITAMIN D PO Take 1 tablet by mouth daily.    ketoconazole (NIZORAL) 2 % shampoo Apply 1 application. topically 2 (two) times a week.   Multiple Vitamins tablet Take 1 tablet by mouth daily.    omeprazole (PRILOSEC) 20 MG capsule TAKE 1 CAPSULE BY MOUTH DAILY   oxybutynin (DITROPAN) 5 MG tablet TAKE 1 TABLET BY MOUTH  TWICE DAILY    Social History: Social History   Tobacco Use   Smoking status: Never   Smokeless tobacco: Never  Vaping Use   Vaping Use: Never used  Substance Use Topics   Alcohol use: No    Alcohol/week: 0.0 standard drinks of alcohol   Drug use: No    Family Medical History: Family History  Problem Relation Age of Onset  Alcohol abuse Mother        deceased 40   Arthritis Mother    Diabetes Father    Breast cancer Maternal Aunt        dx 49s; deceased 27s   Breast cancer Maternal Aunt        dx 17s; deceased 60s   Arthritis Maternal Grandmother     Physical Examination: Vitals:   11/06/21 1943 11/07/21 0445  BP: (!) 144/62 (!) 147/60  Pulse: 80 73  Resp: 20 20  Temp: 98.9 F (37.2 C) 98.8 F (37.1 C)  SpO2: 93% 92%   Heart sounds normal no MRG. Chest Clear to Auscultation Bilaterally.  General: Patient is well developed, well nourished, calm, collected, and in no apparent distress. Attention to examination is  appropriate.  Psychiatric: Patient is non-anxious.  Head:  Pupils equal, round, and reactive to light.  ENT:  Oral mucosa appears well hydrated.  Neck:   In collar  Respiratory: Patient is breathing without any difficulty.  Extremities: No edema.  Vascular: Palpable dorsal pedal pulses.  Skin:   On exposed skin, there are no abnormal skin lesions.  NEUROLOGICAL:     Awake, alert, oriented to person, place, and time.  Speech is clear and fluent. Fund of knowledge is appropriate.   Cranial Nerves: Pupils equal round and reactive to light.  Facial tone is symmetric.  Facial sensation is symmetric. Shoulder shrug is symmetric. Tongue protrusion is midline.  There is no pronator drift.   Strength: Side Biceps Triceps Deltoid Interossei Grip Wrist Ext. Wrist Flex.  R '5 5 5 5 5 5 5  '$ L '5 5 5 5 5 5 5   '$ Side Iliopsoas Quads Hamstring PF DF EHL  R '5 5 5 5 5 5  '$ L '5 5 5 5 5 5   '$ Reflexes are 1+ and symmetric at the biceps, triceps, brachioradialis, patella and achilles.   Hoffman's is absent.  Clonus is not present.  Toes are down-going.  Bilateral upper and lower extremity sensation is intact to light touch.    No evidence of dysmetria noted.  Gait is untested.    Medical Decision Making  Imaging: MRI C spine 11/04/2021 IMPRESSION: 1. Acute oblique type 2 dens fracture with slight posterior displacement, with associated acute nondisplaced fractures through the posterior arch of C1. Findings are stable, and better characterized on prior CT. No significant stenosis at the craniocervical junction. 2. Subtle acute compression fractures involving the superior endplates of T2, T3, and T4 with no more than minimal central height loss. No bony retropulsion. 3. No evidence for traumatic cord injury. Suspected mild ligamentous injury/strain involving the anterior longitudinal ligament at the level of C1. Ligamentous structures otherwise intact. 4. Underlying mild for age multilevel  cervical spondylosis without significant spinal stenosis. Mild bilateral C5 through C7 foraminal narrowing as above.     Electronically Signed   By: Jeannine Boga M.D.   On: 11/05/2021 01:12  CT C spine 11/04/2021 IMPRESSION: 1. Comminuted oblique mid dens (type II) fracture with 2 mm posterior displacement of the dominant superior fracture fragment. 2. Nondisplaced bilateral posterior C1 arch fractures. 3. 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   Critical Value/emergent results were called by telephone at the time of interpretation on 11/04/2021 at 6:00 pm to provider [facility] , who verbally acknowledged these results.     Electronically Signed   By: Ilona Sorrel M.D.   On: 11/04/2021 18:02  I have personally  reviewed the images and agree with the above interpretation.  Assessment and Plan: Theresa Reid is a pleasant 81 y.o. female with C1 posterior arch fractures with unstable closed C2 type 2 odontoid fracture.    We reviewed the options for her, and the current recommendation that early surgical fixation be considered for Type 2 odontoid fractures due to the relatively higher risk of complications with extended external orthosis.  She would like to move forward with surgery.  We will arrange that as an outpatient.  I discussed the planned procedure at length with the patient, including the risks, benefits, alternatives, and indications. The risks discussed include but are not limited to bleeding, infection, need for reoperation, spinal fluid leak, stroke, vision loss, anesthetic complication, coma, paralysis, and even death. I also described in detail that improvement was not guaranteed.  The patient expressed understanding of these risks, and asked that we proceed with surgery. I described the surgery in layman's terms, and gave ample opportunity for questions, which were answered to the best of my  ability.    I spent a total of 30 minutes in face-to-face and non-face-to-face activities related to this patient's care today.  Thank you for involving me in the care of this patient.   This note was partially dictated using voice recognition software, so please excuse any errors that were not corrected.   Minna Dumire K. Izora Ribas MD, [facility]

## 2021-11-07 NOTE — Evaluation (Signed)
Physical Therapy Evaluation Patient Details Name: Theresa Reid MRN: [id number] DOB: [birth year] Today's Date: 11/07/2021  History of Present Illness  Theresa Reid is here today with a chief complaint of C2 and C1 fractures after mechanical fall at home.  Clinical Impression  Pt received seated in recliner upon arrival to room and pt agreeable to therapy.  Pt wearing aspen collar upon arrival and note no increase in pain upon arrival.  Pt able to perform transfer into standing with minA from therapist and requires verbal cuing for hand placement and in order to perform with good body mechanics.  Pt with fair carryover on subsequent attempts.  Pt then ambulated to the therapy gym in order to perform stair training.  Pt able to perform steps with R hand rail and did so with decreased safety awareness the first time, however the second attempt was much more stable with performing and listening to verbal cues.  Pt then ambulated back to bed with all needs met and daughter in room.  Current discharge plans to home without PT follow up is appropriate at this time.  Pt will continue to benefit from skilled therapy in order to address deficits listed below.      Recommendations for follow up therapy are one component of a multi-disciplinary discharge planning process, led by the attending physician.  Recommendations may be updated based on patient status, additional functional criteria and insurance authorization.  Follow Up Recommendations No PT follow up      Assistance Recommended at Discharge PRN  Patient can return home with the following  A little help with walking and/or transfers;A little help with bathing/dressing/bathroom    Equipment Recommendations None recommended by PT  Recommendations for Other Services       Functional Status Assessment Patient has had a recent decline in their functional status and demonstrates the ability to make significant improvements in function in a  reasonable and predictable amount of time.     Precautions / Restrictions Precautions Precautions: Cervical Required Braces or Orthoses: Cervical Brace Cervical Brace: Hard collar (when OOB) Restrictions Weight Bearing Restrictions: No      Mobility  Bed Mobility Overal bed mobility: Needs Assistance Bed Mobility: Supine to Sit     Supine to sit: Min assist     General bed mobility comments: Min A for to scoot pt towards EOB    Transfers Overall transfer level: Needs assistance Equipment used: Rolling walker (2 wheels) Transfers: Sit to/from Stand Sit to Stand: Min assist                Ambulation/Gait Ambulation/Gait assistance: Supervision, Min guard Gait Distance (Feet): 100 Feet Assistive device: Rolling walker (2 wheels) Gait Pattern/deviations: WFL(Within Functional Limits) Gait velocity: decreased     General Gait Details: pt able to ambulate with good consistency once upright.  Stairs            Wheelchair Mobility    Modified Rankin (Stroke Patients Only)       Balance Overall balance assessment: Needs assistance Sitting-balance support: Feet supported Sitting balance-Leahy Scale: Good     Standing balance support: Reliant on assistive device for balance, During functional activity, Bilateral upper extremity supported Standing balance-Leahy Scale: Fair                               Pertinent Vitals/Pain Pain Assessment Pain Assessment: No/denies pain    Home Living Family/patient expects to be discharged  to:: Private residence Living Arrangements: Alone Available Help at Discharge: Family;Available 24 hours/day Type of Home: House Home Access: Stairs to enter Entrance Stairs-Rails: Right Entrance Stairs-Number of Steps: 2   Home Layout: One level Home Equipment: Conservation officer, nature (2 wheels);Cane - single point;BSC/3in1 Additional Comments: Pt normally lives alone but will be discharging to stay at her daughter's  home at discharge (home set up listed above is daughter home)    Prior Function Prior Level of Function : Independent/Modified Independent             Mobility Comments: Use of SPC in community. ADLs Comments: Ind in self care and IADLs. She does not drive and daughter takes her to appointments, grocery store, etc.     Hand Dominance   Dominant Hand: Right    Extremity/Trunk Assessment   Upper Extremity Assessment Upper Extremity Assessment: Generalized weakness    Lower Extremity Assessment Lower Extremity Assessment: Generalized weakness       Communication   Communication: No difficulties  Cognition Arousal/Alertness: Awake/alert Behavior During Therapy: WFL for tasks assessed/performed Overall Cognitive Status: Within Functional Limits for tasks assessed                                          General Comments      Exercises     Assessment/Plan    PT Assessment Patient needs continued PT services  PT Problem List Decreased strength;Decreased activity tolerance;Decreased balance;Decreased mobility       PT Treatment Interventions DME instruction;Gait training;Stair training;Functional mobility training;Therapeutic activities;Therapeutic exercise;Balance training;Neuromuscular re-education    PT Goals (Current goals can be found in the Care Plan section)  Acute Rehab PT Goals Patient Stated Goal: to go home. PT Goal Formulation: With patient Time For Goal Achievement: 11/21/21 Potential to Achieve Goals: Good    Frequency Min 2X/week     Co-evaluation               AM-PAC PT "6 Clicks" Mobility  Outcome Measure Help needed turning from your back to your side while in a flat bed without using bedrails?: A Little Help needed moving from lying on your back to sitting on the side of a flat bed without using bedrails?: A Little Help needed moving to and from a bed to a chair (including a wheelchair)?: A Little Help needed  standing up from a chair using your arms (e.g., wheelchair or bedside chair)?: A Little Help needed to walk in hospital room?: A Little Help needed climbing 3-5 steps with a railing? : A Little 6 Click Score: 18    End of Session Equipment Utilized During Treatment: Gait belt Activity Tolerance: Patient tolerated treatment well Patient left: in chair;with call bell/phone within reach;with chair alarm set;with family/visitor present Nurse Communication: Mobility status PT Visit Diagnosis: Unsteadiness on feet (R26.81);Other abnormalities of gait and mobility (R26.89);Muscle weakness (generalized) (M62.81);Difficulty in walking, not elsewhere classified (R26.2)    Time: 8088-1103 PT Time Calculation (min) (ACUTE ONLY): 15 min   Charges:   PT Evaluation $PT Eval Low Complexity: 1 Low PT Treatments $Gait Training: 8-22 mins        Gwenlyn Saran, PT, DPT 11/07/21, 4:25 PM   Christie Nottingham 11/07/2021, 4:21 PM

## 2021-11-07 NOTE — Evaluation (Signed)
Occupational Therapy Evaluation Patient Details Name: Theresa Reid MRN: [id number] DOB: Sep 15, 1940 Today's Date: 11/07/2021   History of Present Illness Theresa Reid is here today with a chief complaint of C2 and C1 fractures after mechanical fall at home.   Clinical Impression   Patient presenting with decreased Ind in self care, balance, functional mobility/transfers, endurance, and safety awareness. Patient reports being mod I at baseline and living at home alone. Pt will be discharging to stay with daughter who is available to assist her 24/7. OT reviewing wearing and caring for cervical hard collar. OT donned collar in bed as pt is observed to be moving neck freely in bed and discussed unstable fracture concerns. Pt also having increased pain in neck and discussed use of collar more if needed for support/pain management. Pt and daughter verbalized understanding. Pt standing from standard height surfaces with min A and use of RW for short distance ambulation in room/bathroom. Pt needing min A to thread mesh brief while seated and standing with min guard for hygiene. Pt seated in recliner chair at end of session with daughter remaining present in room. Patient will benefit from acute OT to increase overall independence in the areas of ADLs, functional mobility, and safety awareness in order to safely discharge home with family.     Recommendations for follow up therapy are one component of a multi-disciplinary discharge planning process, led by the attending physician.  Recommendations may be updated based on patient status, additional functional criteria and insurance authorization.   Follow Up Recommendations  Follow physician's recommendations for discharge plan and follow up therapies    Assistance Recommended at Discharge Frequent or constant Supervision/Assistance  Patient can return home with the following A little help with walking and/or transfers;A little help with  bathing/dressing/bathroom;Help with stairs or ramp for entrance;Assist for transportation;Assistance with cooking/housework    Functional Status Assessment  Patient has had a recent decline in their functional status and demonstrates the ability to make significant improvements in function in a reasonable and predictable amount of time.  Equipment Recommendations  None recommended by OT       Precautions / Restrictions Precautions Precautions: Cervical Required Braces or Orthoses: Cervical Brace Cervical Brace: Hard collar (when OOB) Restrictions Weight Bearing Restrictions: No      Mobility Bed Mobility Overal bed mobility: Needs Assistance Bed Mobility: Supine to Sit     Supine to sit: Min assist     General bed mobility comments: Min A for to scoot pt towards EOB    Transfers Overall transfer level: Needs assistance Equipment used: Rolling walker (2 wheels) Transfers: Sit to/from Stand, Bed to chair/wheelchair/BSC Sit to Stand: Min assist     Step pivot transfers: Min guard            Balance Overall balance assessment: Needs assistance Sitting-balance support: Feet supported Sitting balance-Leahy Scale: Good     Standing balance support: Reliant on assistive device for balance, During functional activity, Bilateral upper extremity supported Standing balance-Leahy Scale: Fair                             ADL either performed or assessed with clinical judgement   ADL Overall ADL's : Needs assistance/impaired                     Lower Body Dressing: Minimal assistance Lower Body Dressing Details (indicate cue type and reason): to thread mesh underwear onto B  feet but pt stands to pull over B hips with min guard Toilet Transfer: Min guard;Rolling walker (2 wheels)   Toileting- Clothing Manipulation and Hygiene: Min guard;Sit to/from stand       Functional mobility during ADLs: Supervision/safety;Min guard;Rolling walker (2 wheels)        Vision Patient Visual Report: No change from baseline              Pertinent Vitals/Pain Pain Assessment Pain Assessment: 0-10 Pain Score: 6  Pain Location: posterior neck Pain Descriptors / Indicators: Discomfort Pain Intervention(s): Limited activity within patient's tolerance, Premedicated before session, Repositioned, Monitored during session     Hand Dominance Right   Extremity/Trunk Assessment Upper Extremity Assessment Upper Extremity Assessment: Overall WFL for tasks assessed;Generalized weakness   Lower Extremity Assessment Lower Extremity Assessment: Overall WFL for tasks assessed;Generalized weakness       Communication Communication Communication: No difficulties   Cognition Arousal/Alertness: Awake/alert Behavior During Therapy: WFL for tasks assessed/performed Overall Cognitive Status: Within Functional Limits for tasks assessed                                                  Home Living Family/patient expects to be discharged to:: Private residence Living Arrangements: Alone Available Help at Discharge: Family;Available 24 hours/day Type of Home: House Home Access: Stairs to enter [employer] of Steps: 2 Entrance Stairs-Rails: Right Home Layout: One level     Bathroom Shower/Tub: Walk-in shower         Home Equipment: Conservation officer, nature (2 wheels);Cane - single point;BSC/3in1   Additional Comments: Pt normally lives alone but will be discharging to stay at her daughter's home at discharge (home set up listed above is daughter home)      Prior Functioning/Environment Prior Level of Function : Independent/Modified Independent             Mobility Comments: Use of SPC in community. ADLs Comments: Ind in self care and IADLs. She does not drive and daughter takes her to appointments, grocery store, etc.        OT Problem List: Decreased strength;Decreased range of motion;Decreased activity  tolerance;Impaired balance (sitting and/or standing);Pain;Decreased safety awareness;Decreased knowledge of use of DME or AE;Decreased knowledge of precautions      OT Treatment/Interventions: Self-care/ADL training;Therapeutic exercise;Therapeutic activities;DME and/or AE instruction;Patient/family education;Balance training;Manual therapy    OT Goals(Current goals can be found in the care plan section) Acute Rehab OT Goals Patient Stated Goal: to go home and get stronger OT Goal Formulation: With patient/family Time For Goal Achievement: 11/21/21 Potential to Achieve Goals: Fair ADL Goals Pt Will Perform Grooming: standing;with modified independence Pt Will Perform Lower Body Dressing: with modified independence;with adaptive equipment;sit to/from stand Pt Will Transfer to Toilet: with modified independence;ambulating Pt Will Perform Toileting - Clothing Manipulation and hygiene: with modified independence;sit to/from stand  OT Frequency: Min 2X/week       AM-PAC OT "6 Clicks" Daily Activity     Outcome Measure Help from another person eating meals?: None Help from another person taking care of personal grooming?: A Little Help from another person toileting, which includes using toliet, bedpan, or urinal?: A Little Help from another person bathing (including washing, rinsing, drying)?: A Little Help from another person to put on and taking off regular upper body clothing?: A Little Help from another person to put on and  taking off regular lower body clothing?: A Little 6 Click Score: 19   End of Session Equipment Utilized During Treatment: Rolling walker (2 wheels);Cervical collar Nurse Communication: Mobility status  Activity Tolerance: Patient tolerated treatment well Patient left: in chair;with call bell/phone within reach;with chair alarm set;with family/visitor present  OT Visit Diagnosis: Unsteadiness on feet (R26.81);Repeated falls (R29.6);Muscle weakness (generalized)  (M62.81)                Time: 5537-4827 OT Time Calculation (min): 28 min Charges:  OT General Charges $OT Visit: 1 Visit OT Evaluation $OT Eval Moderate Complexity: 1 Mod OT Treatments $Self Care/Home Management : 8-22 mins  Darleen Crocker, MS, OTR/L , CBIS ascom [phone]  11/07/21, 12:34 PM

## 2021-11-07 NOTE — Care Management Important Message (Signed)
Important Message  Patient Details  Name: Theresa Reid MRN: [id number] Date of Birth: April 27, 1940   Medicare Important Message Given:  Yes     Dannette Barbara 11/07/2021, 2:10 PM

## 2021-11-07 NOTE — Plan of Care (Signed)

## 2021-11-07 NOTE — Consult Note (Signed)
Referring Physician:  No referring provider defined for this encounter.  Primary Physician:  Leone Haven, MD  History of Present Illness: 11/07/2021 [name] is here today with a chief complaint of C2 and C1 fractures. She had a fall.  Her history is well reviewed in the consultation note from July 15 by Dr. Dava Najjar.  He asked that I review her case for possible surgery.  She has continued neck pain but no neurologic complaints otherwise.  Review of Systems:  A 10 point review of systems is negative, except for the pertinent positives and negatives detailed in the HPI.  Past Medical History: Past Medical History:  Diagnosis Date   Arthritis    Breast cancer ([place])    Diverticulitis    Family history of breast cancer    GERD (gastroesophageal reflux disease)    Hyperlipidemia    Hypertension    Personal history of chemotherapy    Personal history of radiation therapy     Past Surgical History: Past Surgical History:  Procedure Laterality Date   BREAST BIOPSY Right 05/28/2018   [place] bx, INVASIVE MAMMARY CARCINOMA WITH LOBULAR FEATURES   BREAST LUMPECTOMY Right 06/12/2018   [place], lobular features   BREAST LUMPECTOMY WITH SENTINEL LYMPH NODE BIOPSY Right 06/12/2018   Procedure: RIGHT BREAST WIDE EXCISION WITH SENTINEL LYMPH NODE BX;  Surgeon: Robert Bellow, MD;  Location: [facility];  Service: General;  Laterality: Right;   [place] Right 10/30/2018   Procedure: IRRIGATION AND DEBRIDEMENT RIGHT CHEST WALL WOUND;  Surgeon: Robert Bellow, MD;  Location: [facility];  Service: General;  Laterality: Right;   JOINT REPLACEMENT     MASTECTOMY Right    MASTECTOMY WITH AXILLARY LYMPH NODE DISSECTION Right 09/20/2018   Procedure: MASTECTOMY WITH AXILLARY LYMPH NODE DISSECTION RIGHT;  Surgeon: Robert Bellow, MD;  Location: [facility];  Service: General;  Laterality: Right;   ovaraian cyst  removal Right    Cyst only (NOT OVARY)   PORTACATH PLACEMENT Left 09/20/2018   Procedure: INSERTION PORT-A-CATH, LEFT;  Surgeon: Robert Bellow, MD;  Location: [facility];  Service: General;  Laterality: Left;   TOTAL KNEE ARTHROPLASTY Bilateral 05/05/2010    Allergies: Allergies as of 11/04/2021   (No Known Allergies)    Medications: Current Meds  Medication Sig   amLODipine (NORVASC) 5 MG tablet TAKE 1 TABLET BY MOUTH  DAILY   anastrozole (ARIMIDEX) 1 MG tablet TAKE 1 TABLET BY MOUTH  DAILY DO NOT START UNTIL  COMPLETED RADIATION IN JAN  2021   atorvastatin (LIPITOR) 80 MG tablet TAKE 1 TABLET BY MOUTH  DAILY   CALCIUM-VITAMIN D PO Take 1 tablet by mouth daily.    ketoconazole (NIZORAL) 2 % shampoo Apply 1 application. topically 2 (two) times a week.   Multiple Vitamins tablet Take 1 tablet by mouth daily.    omeprazole (PRILOSEC) 20 MG capsule TAKE 1 CAPSULE BY MOUTH DAILY   oxybutynin (DITROPAN) 5 MG tablet TAKE 1 TABLET BY MOUTH  TWICE DAILY    Social History: Social History   Tobacco Use   Smoking status: Never   Smokeless tobacco: Never  Vaping Use   Vaping Use: Never used  Substance Use Topics   Alcohol use: No    Alcohol/week: 0.0 standard drinks of alcohol   Drug use: No    Family Medical History: Family History  Problem Relation Age of Onset  Alcohol abuse Mother        deceased 26   Arthritis Mother    Diabetes Father    Breast cancer Maternal Aunt        dx 17s; deceased 72s   Breast cancer Maternal Aunt        dx 37s; deceased 37s   Arthritis Maternal Grandmother     Physical Examination: Vitals:   11/06/21 1943 11/07/21 0445  BP: (!) 144/62 (!) 147/60  Pulse: 80 73  Resp: 20 20  Temp: 98.9 F (37.2 C) 98.8 F (37.1 C)  SpO2: 93% 92%   Heart sounds normal no MRG. Chest Clear to Auscultation Bilaterally.  General: Patient is well developed, well nourished, calm, collected, and in no apparent distress. Attention to examination is  appropriate.  Psychiatric: Patient is non-anxious.  Head:  Pupils equal, round, and reactive to light.  ENT:  Oral mucosa appears well hydrated.  Neck:   In collar  Respiratory: Patient is breathing without any difficulty.  Extremities: No edema.  Vascular: Palpable dorsal pedal pulses.  Skin:   On exposed skin, there are no abnormal skin lesions.  NEUROLOGICAL:     Awake, alert, oriented to person, place, and time.  Speech is clear and fluent. Fund of knowledge is appropriate.   Cranial Nerves: Pupils equal round and reactive to light.  Facial tone is symmetric.  Facial sensation is symmetric. Shoulder shrug is symmetric. Tongue protrusion is midline.  There is no pronator drift.   Strength: Side Biceps Triceps Deltoid Interossei Grip Wrist Ext. Wrist Flex.  R '5 5 5 5 5 5 5  '$ L '5 5 5 5 5 5 5   '$ Side Iliopsoas Quads Hamstring PF DF EHL  R '5 5 5 5 5 5  '$ L '5 5 5 5 5 5   '$ Reflexes are 1+ and symmetric at the biceps, triceps, brachioradialis, patella and achilles.   Hoffman's is absent.  Clonus is not present.  Toes are down-going.  Bilateral upper and lower extremity sensation is intact to light touch.    No evidence of dysmetria noted.  Gait is untested.    Medical Decision Making  Imaging: MRI C spine 11/04/2021 IMPRESSION: 1. Acute oblique type 2 dens fracture with slight posterior displacement, with associated acute nondisplaced fractures through the posterior arch of C1. Findings are stable, and better characterized on prior CT. No significant stenosis at the craniocervical junction. 2. Subtle acute compression fractures involving the superior endplates of T2, T3, and T4 with no more than minimal central height loss. No bony retropulsion. 3. No evidence for traumatic cord injury. Suspected mild ligamentous injury/strain involving the anterior longitudinal ligament at the level of C1. Ligamentous structures otherwise intact. 4. Underlying mild for age multilevel  cervical spondylosis without significant spinal stenosis. Mild bilateral C5 through C7 foraminal narrowing as above.     Electronically Signed   By: Jeannine Boga M.D.   On: 11/05/2021 01:12  CT C spine 11/04/2021 IMPRESSION: 1. Comminuted oblique mid dens (type II) fracture with 2 mm posterior displacement of the dominant superior fracture fragment. 2. Nondisplaced bilateral posterior C1 arch fractures. 3. 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   Critical Value/emergent results were called by telephone at the time of interpretation on 11/04/2021 at 6:00 pm to provider [facility] , who verbally acknowledged these results.     Electronically Signed   By: Ilona Sorrel M.D.   On: 11/04/2021 18:02  I have personally  reviewed the images and agree with the above interpretation.  Assessment and Plan: Ms. Radloff is a pleasant 81 y.o. female with C1 posterior arch fractures with unstable closed C2 type 2 odontoid fracture.    We reviewed the options for her, and the current recommendation that early surgical fixation be considered for Type 2 odontoid fractures due to the relatively higher risk of complications with extended external orthosis.  She would like to move forward with surgery.  We will arrange that as an outpatient.  I discussed the planned procedure at length with the patient, including the risks, benefits, alternatives, and indications. The risks discussed include but are not limited to bleeding, infection, need for reoperation, spinal fluid leak, stroke, vision loss, anesthetic complication, coma, paralysis, and even death. I also described in detail that improvement was not guaranteed.  The patient expressed understanding of these risks, and asked that we proceed with surgery. I described the surgery in layman's terms, and gave ample opportunity for questions, which were answered to the best of my  ability.    I spent a total of 30 minutes in face-to-face and non-face-to-face activities related to this patient's care today.  Thank you for involving me in the care of this patient.   This note was partially dictated using voice recognition software, so please excuse any errors that were not corrected.   Maki Sweetser K. Izora Ribas MD, [facility] Neurosurgery

## 2021-11-08 ENCOUNTER — Telehealth: Payer: Self-pay | Admitting: Family Medicine

## 2021-11-08 DIAGNOSIS — S12100A Unspecified displaced fracture of second cervical vertebra, initial encounter for closed fracture: Secondary | ICD-10-CM | POA: Diagnosis not present

## 2021-11-08 MED ORDER — GABAPENTIN 300 MG PO CAPS: 300.0000 mg | ORAL_CAPSULE | Freq: Three times a day (TID) | ORAL | 0 refills | Status: DC

## 2021-11-08 MED ORDER — ONDANSETRON HCL 4 MG PO TABS: 4.0000 mg | ORAL_TABLET | Freq: Four times a day (QID) | ORAL | 0 refills | Status: DC | PRN

## 2021-11-08 MED ORDER — SENNOSIDES-DOCUSATE SODIUM 8.6-50 MG PO TABS: 1.0000 | ORAL_TABLET | Freq: Every evening | ORAL | 0 refills | Status: DC | PRN

## 2021-11-08 MED ORDER — ACETAMINOPHEN 325 MG PO TABS
650.0000 mg | ORAL_TABLET | Freq: Four times a day (QID) | ORAL | Status: AC | PRN
Start: 2021-11-08 — End: ?

## 2021-11-08 MED ORDER — OXYCODONE HCL 5 MG PO TABS: 5.0000 mg | ORAL_TABLET | Freq: Three times a day (TID) | ORAL | 0 refills | Status: DC | PRN

## 2021-11-08 NOTE — Plan of Care (Signed)

## 2021-11-08 NOTE — Telephone Encounter (Signed)
-----   Message from Meade Maw, MD sent at 11/08/2021 11:05 AM EDT ----- Regarding: RE: Request for sooner surgery date If it is possible to get authorization, next week either M or W would be fine  ----- Message ----- From: Berdine Addison, RN Sent: 11/08/2021  10:59 AM EDT To: Meade Maw, MD Subject: FW: Request for sooner surgery date            Thoughts?  ----- Message ----- From: Conley Simmonds Sent: 11/08/2021   9:39 AM EDT To: Rae Lips, [place]; Berdine Addison, RN Subject: Request for sooner surgery date                Received call from patient's daughter [name]; stated patient changed her mind about when to do the surgery. Requesting soonest available date after Dr. Izora Ribas returns from vacation.  Please advise at [phone].

## 2021-11-08 NOTE — Telephone Encounter (Signed)
Notes. Will follow.

## 2021-11-08 NOTE — Discharge Summary (Signed)
Physician Discharge Summary  Theresa Reid:[id number] DOB: 06-28-1940 DOA: 11/04/2021  PCP: Leone Haven, MD  Admit date: 11/04/2021 Discharge date: 11/08/2021  Admitted From: Home Disposition: Home  Recommendations for Outpatient Follow-up:  Follow up with PCP in 1-2 weeks Follow-up with neurosurgery 2 to 3 weeks  Home Health: No Equipment/Devices: None  Discharge Condition: Stable CODE STATUS: Full Diet recommendation: Regular  Brief/Interim Summary: 81 y.o. female with a known history of OA, BRCA, GERD, HTN, HLD presents to the emergency department for evaluation of neck pain s/p fall.  Patient was in a usual state of health until today when she lost balance and fell down 6 stairs landing on her right hip.  .   Patient denies fevers/chills, weakness, dizziness, chest pain, shortness of breath, N/V/C/D, abdominal pain, dysuria/frequency, changes in mental status.    Of note patient has been taking prednisone for an arthritis flare recently.     Otherwise there has been no change in status. Patient has been taking medication as prescribed and there has been no recent change in medication or diet.  No recent antibiotics.  There has been no recent illness, hospitalizations, travel or sick contacts.     Multimodal pain regimen initiated on 7/15 with good result.  Pain better controlled on 7/16.  Per last neurosurgery note no plans for inpatient surgical intervention but will continue to evaluate   Case discussed with neurosurgery on 7/17.  Images reviewed.  Neurosurgery spoke to patient and daughter at bedside.  Will plan for surgical fixation as outpatient.  Okay for discharge from neurosurgical standpoint.  Did well with physical therapy.  Pain controlled on oral agents.  Discharge home on 7/18.  Follow-up outpatient PCP and neurosurgery      Discharge Diagnoses:  Principal Problem:   Dens fracture ([place])  C1 fracture Status post mechanical fall Secondary to  mechanical fall.  Patient in [place] collar.  Per family member at bedside neurosurgery states that surgical evaluation will be considered on Monday. Plan: Continue [place] collar at all times DC bedrest May remove collar to sleep and eat otherwise wear at all times Multimodal pain regimen, minimize narcotics Discharge home with outpatient neurosurgical follow-up  Discharge Instructions  Discharge Instructions     Diet - low sodium heart healthy   Complete by: As directed    Increase activity slowly   Complete by: As directed       Allergies as of 11/08/2021   No Known Allergies      Medication List     TAKE these medications    acetaminophen 325 MG tablet Commonly known as: TYLENOL Take 2 tablets (650 mg total) by mouth every 6 (six) hours as needed for mild pain (or Fever >/= 101).   amLODipine 5 MG tablet Commonly known as: NORVASC TAKE 1 TABLET BY MOUTH  DAILY   anastrozole 1 MG tablet Commonly known as: ARIMIDEX TAKE 1 TABLET BY MOUTH  DAILY DO NOT START UNTIL  COMPLETED RADIATION IN JAN  2021   atorvastatin 80 MG tablet Commonly known as: LIPITOR TAKE 1 TABLET BY MOUTH  DAILY   CALCIUM-VITAMIN D PO Take 1 tablet by mouth daily.   gabapentin 300 MG capsule Commonly known as: Neurontin Take 1 capsule (300 mg total) by mouth 3 (three) times daily for 20 days.   ketoconazole 2 % shampoo Commonly known as: NIZORAL Apply 1 application. topically 2 (two) times a week.   Multiple Vitamins tablet Take 1 tablet by mouth daily.  omeprazole 20 MG capsule Commonly known as: PRILOSEC TAKE 1 CAPSULE BY MOUTH DAILY   ondansetron 4 MG tablet Commonly known as: ZOFRAN Take 1 tablet (4 mg total) by mouth every 6 (six) hours as needed for nausea.   oxybutynin 5 MG tablet Commonly known as: DITROPAN TAKE 1 TABLET BY MOUTH  TWICE DAILY   oxyCODONE 5 MG immediate release tablet Commonly known as: Roxicodone Take 1 tablet (5 mg total) by mouth every 8 (eight) hours  as needed.   predniSONE 5 MG tablet Commonly known as: DELTASONE prednisone 5 mg tablet  TAKE 1 TO 2 TABLETS BY MOUTH EVERY DAY AS NEEDED FOR ARTHRITIS FLARE   senna-docusate 8.6-50 MG tablet Commonly known as: Senokot-S Take 1 tablet by mouth at bedtime as needed for mild constipation.        Follow-up Information     Meade Maw, MD. Go on 11/17/2021.   Specialty: Neurosurgery Why: Call office to confirm appointment details;  Appt @ 3:45 pm Contact information: [address] [place] [address] [phone]         Leone Haven, MD. Go on 11/15/2021.   Specialty: Family Medicine Why: Arrive by 10:45 am;  Appt @ 11:00 am Contact information: [place] [address] [phone]                No Known Allergies  Consultations: Neurosurgery   Procedures/Studies: MR Cervical Spine Wo Contrast  Result Date: 11/05/2021 CLINICAL DATA:  Initial evaluation for compression fracture. EXAM: MRI CERVICAL SPINE WITHOUT CONTRAST TECHNIQUE: Multiplanar, multisequence MR imaging of the cervical spine was performed. No intravenous contrast was administered. COMPARISON:  Prior CT from 11/04/2021. FINDINGS: Alignment: Mild straightening of the normal cervical lordosis. No interval listhesis or malalignment. Vertebrae: Previously identified acute oblique mildly displaced fracture extending through the dens again seen, relatively similar in position and alignment as compared to prior CT. Additional acute nondisplaced fractures involving the posterior arch of C1 grossly stable, better seen on prior CT. There are likely subtle acute compression fractures involving the superior endplates of T2, T3, and T4 with no more than minimal central height loss. No bony retropulsion. Otherwise, vertebral body height maintained with no other visible acute fracture. Underlying bone marrow signal intensity within normal limits. No discrete or  worrisome osseous lesions. Mild discogenic reactive endplate change present about the C6-7 interspace. Cord: Normal signal and morphology. No evidence for traumatic cord injury. Tectorial membrane, posterior longitudinal ligament, and ligamentum flavum appear intact. Focal signal abnormality at the level of the anterior longitudinal ligament at the level of the anterior arch of C1 could reflect mild ligamentous injury/strain (series 7, image 7). Posterior Fossa, vertebral arteries, paraspinal tissues: Visualized brain and posterior fossa within normal limits. Craniocervical junction within normal limits. Mild edema noted within the right posterior paraspinous soft tissues at the upper back at the levels of T1 through T3, likely associated mild soft tissue injury (series 7, image 2). Prevertebral edema related to the acute C1/C2 fractures seen extending from the clivus to C5. Normal flow voids seen within the vertebral arteries bilaterally. Disc levels: C1-2: Acute oblique type 2 dens fracture with slight posterior displacement. No significant stenosis at the craniocervical junction. C2-C3: Unremarkable. C3-C4: Mild disc bulge with uncovertebral hypertrophy. Bilateral facet hypertrophy. No significant spinal stenosis. Foramina remain patent. C4-C5: Mild disc bulge with bilateral uncovertebral spurring. Left-sided facet hypertrophy. No significant spinal stenosis. Mild right C5 foraminal narrowing. Left neural foramen remains patent. C5-C6:  Mild disc bulge with uncovertebral spurring. Mild facet hypertrophy. No significant spinal stenosis. Mild bilateral C6 foraminal stenosis. C6-C7: Degenerative intervertebral disc space narrowing with circumferential disc osteophyte complex. Flattening and partial effacement of the ventral thecal sac without significant spinal stenosis. Mild bilateral C7 foraminal narrowing. C7-T1: Negative interspace. Right-sided facet hypertrophy. No stenosis. Visualized upper thoracic spine  demonstrates mild noncompressive disc bulging at T1-2. No significant stenosis. IMPRESSION: 1. Acute oblique type 2 dens fracture with slight posterior displacement, with associated acute nondisplaced fractures through the posterior arch of C1. Findings are stable, and better characterized on prior CT. No significant stenosis at the craniocervical junction. 2. Subtle acute compression fractures involving the superior endplates of T2, T3, and T4 with no more than minimal central height loss. No bony retropulsion. 3. No evidence for traumatic cord injury. Suspected mild ligamentous injury/strain involving the anterior longitudinal ligament at the level of C1. Ligamentous structures otherwise intact. 4. Underlying mild for age multilevel cervical spondylosis without significant spinal stenosis. Mild bilateral C5 through C7 foraminal narrowing as above. Electronically Signed   By: Jeannine Boga M.D.   On: 11/05/2021 01:12   DG Chest Portable 1 View  Result Date: 11/04/2021 CLINICAL DATA:  Trauma, fell, upper lobe airspace disease on CT neck EXAM: PORTABLE CHEST 1 VIEW COMPARISON:  11/04/2021 FINDINGS: Single frontal view of the chest demonstrates stable left chest wall port. Cardiac silhouette is unremarkable. No acute airspace disease, effusion, or pneumothorax. The density seen on preceding neck CT or likely hypoventilatory. No acute bony abnormalities. IMPRESSION: 1. No acute intrathoracic process. Electronically Signed   By: Randa Ngo M.D.   On: 11/04/2021 21:03   CT Angio Neck W and/or Wo Contrast  Result Date: 11/04/2021 CLINICAL DATA:  Cervical spine fractures. Fall. EXAM: CT ANGIOGRAPHY NECK TECHNIQUE: Multidetector CT imaging of the neck was performed using the standard protocol during bolus administration of intravenous contrast. Multiplanar CT image reconstructions and MIPs were obtained to evaluate the vascular anatomy. Carotid stenosis measurements (when applicable) are obtained  utilizing NASCET criteria, using the distal internal carotid diameter as the denominator. RADIATION DOSE REDUCTION: This exam was performed according to the departmental dose-optimization program which includes automated exposure control, adjustment of the mA and/or kV according to patient size and/or use of iterative reconstruction technique. CONTRAST:  41m OMNIPAQUE IOHEXOL 350 MG/ML SOLN COMPARISON:  CT of the cervical spine 11/04/2021 FINDINGS: Aortic arch: Three-vessel or arch is present. Atherosclerotic calcifications are present. No aneurysm or stenosis. Right carotid system: The right common carotid artery is within normal limits. Minimal calcifications present bifurcation. Cervical right ICA is within normal limits. Left carotid system: The left common carotid artery is within normal limits. Bifurcation is unremarkable. The cervical left ICA is within normal limits. Vertebral arteries: The left vertebral artery is slightly dominant. Both vertebral arteries originate from the subclavian arteries without significant stenosis. No significant stenosis present in either vertebral artery in the neck. No acute vascular trauma is present in the setting of the C1 and C2 fractures. PICA origins are visualized and within normal limits. The vertebrobasilar junction and basilar artery are normal. Skeleton: Type 2 dens fracture see is and posterior C1 arch fracture again noted. Multilevel degenerative changes are present. Additional fractures. Other neck: Soft tissues the neck are otherwise unremarkable. Salivary glands are within normal limits. Thyroid is normal. No significant adenopathy is present. No focal mucosal or submucosal lesions are present. Upper chest: Patchy ground-glass attenuation is noted dependently in the upper lobes. Thoracic inlet is  within normal limits. IMPRESSION: 1. No acute vascular trauma in the setting of the C1 and C2 fractures. 2. Minimal atherosclerotic changes at the right carotid  bifurcation without significant stenosis. 3. Multilevel degenerative changes of the cervical spine. Electronically Signed   By: San Morelle M.D.   On: 11/04/2021 20:28   DG Hip Unilat W or Wo Pelvis 2-3 Views Right  Result Date: 11/04/2021 CLINICAL DATA:  Trauma, fall EXAM: DG HIP (WITH OR WITHOUT PELVIS) 2-3V RIGHT COMPARISON:  01/01/2017 FINDINGS: No fracture or dislocation is seen. Joint spaces in both hips appear symmetrical. Degenerative changes are noted in the visualized lower lumbar spine. No significant interval changes are noted. IMPRESSION: No fracture or dislocation is seen in pelvis and right hip. Electronically Signed   By: Elmer Picker M.D.   On: 11/04/2021 18:02   CT Cervical Spine Wo Contrast  Result Date: 11/04/2021 CLINICAL DATA:  Neck trauma (Age >= 65y) fall down 6 steps onto concrete with head injury. Neck pain. EXAM: CT CERVICAL SPINE WITHOUT CONTRAST TECHNIQUE: Multidetector CT imaging of the cervical spine was performed without intravenous contrast. Multiplanar CT image reconstructions were also generated. RADIATION DOSE REDUCTION: This exam was performed according to the departmental dose-optimization program which includes automated exposure control, adjustment of the mA and/or kV according to patient size and/or use of iterative reconstruction technique. COMPARISON:  None Available. FINDINGS: Alignment: Straightening of the cervical spine. No facet subluxation. Skull base and vertebrae: Comminuted oblique mid dens fracture with 2 mm posterior displacement of the dominant superior fracture fragment. Nondisplaced bilateral posterior C1 arch fractures (series 2/image 20). No primary bone lesion or focal pathologic process. Soft tissues and spinal canal: No prevertebral edema. No visible canal hematoma. 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 Marked bilateral facet arthropathy. Mild degenerative foraminal  stenosis on the right at C6-7. Upper chest: No acute abnormality. Other: Visualized mastoid air cells appear clear. No discrete thyroid nodules. No pathologically enlarged cervical nodes. IMPRESSION: 1. Comminuted oblique mid dens (type II) fracture with 2 mm posterior displacement of the dominant superior fracture fragment. 2. Nondisplaced bilateral posterior C1 arch fractures. 3. 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 Critical Value/emergent results were called by telephone at the time of interpretation on 11/04/2021 at 6:00 pm to provider [facility] , who verbally acknowledged these results. Electronically Signed   By: Ilona Sorrel M.D.   On: 11/04/2021 18:02   CT HEAD WO CONTRAST (5MM)  Result Date: 11/04/2021 CLINICAL DATA:  Head trauma, minor (Age >= 65y) fall down 6 steps onto concrete with head injury. Neck pain. EXAM: CT HEAD WITHOUT CONTRAST TECHNIQUE: Contiguous axial images were obtained from the base of the skull through the vertex without intravenous contrast. RADIATION DOSE REDUCTION: This exam was performed according to the departmental dose-optimization program which includes automated exposure control, adjustment of the mA and/or kV according to patient size and/or use of iterative reconstruction technique. COMPARISON:  09/27/2016 head CT. FINDINGS: Brain: Generalized cerebral volume loss. No evidence of parenchymal hemorrhage or extra-axial fluid collection. No mass lesion, mass effect, or midline shift. No CT evidence of acute infarction. Nonspecific mild subcortical and periventricular white matter hypodensity, most in keeping with chronic small vessel ischemic change. No ventriculomegaly. Vascular: No acute abnormality. Skull: No evidence of calvarial fracture. Sinuses/Orbits: The visualized paranasal sinuses are essentially clear. Other: Nondisplaced bilateral posterior C1 arch fractures noted (series 3/image 1). The mastoid  air cells are unopacified. IMPRESSION:  1. No evidence of acute intracranial abnormality. No evidence of calvarial fracture. 2. Nondisplaced bilateral posterior C1 arch fractures. Please see the separate concurrent cervical spine CT report for further details. 3. Generalized cerebral volume loss and mild chronic small vessel ischemic changes in the cerebral white matter. Electronically Signed   By: Ilona Sorrel M.D.   On: 11/04/2021 17:54      Subjective: Seen and examined on the day of discharge.  Stable no distress.  Stable for discharge home.  Daughter at bedside.  Discharge Exam: Vitals:   11/08/21 0359 11/08/21 0936  BP: (!) 142/52 (!) 150/69  Pulse: 66 86  Resp: 17 16  Temp: 97.7 F (36.5 C) 99 F (37.2 C)  SpO2: 92% 95%   Vitals:   11/07/21 1738 11/07/21 1919 11/08/21 0359 11/08/21 0936  BP: (!) 141/69 (!) 144/60 (!) 142/52 (!) 150/69  Pulse: 86 83 66 86  Resp: '16 17 17 16  ' Temp: 98.6 F (37 C) 97.7 F (36.5 C) 97.7 F (36.5 C) 99 F (37.2 C)  TempSrc:  Oral Oral   SpO2: 93% 95% 92% 95%  Weight:      Height:        General: Pt is alert, awake, not in acute distress Cardiovascular: RRR, S1/S2 +, no rubs, no gallops Respiratory: CTA bilaterally, no wheezing, no rhonchi Abdominal: Soft, NT, ND, bowel sounds + Extremities: no edema, no cyanosis    The results of significant diagnostics from this hospitalization (including imaging, microbiology, ancillary and laboratory) are listed below for reference.     Microbiology: No results found for this or any previous visit (from the past 240 hour(s)).   Labs: BNP (last 3 results) No results for input(s): "BNP" in the last 8760 hours. Basic Metabolic Panel: Recent Labs  Lab 11/04/21 1728 11/05/21 0416 11/06/21 0829 11/07/21 0756  NA 140 139 133* 135  K 3.9 3.0* 4.4 3.8  CL 110 105 100 102  CO2 '25 24 26 26  ' GLUCOSE 200* 115* 105* 105*  BUN 15 8 7* 10  CREATININE 0.64 0.37* 0.40* 0.44  CALCIUM 9.0 8.7* 8.7*  8.5*   Liver Function Tests: Recent Labs  Lab 11/04/21 1728  AST 26  ALT 20  ALKPHOS 81  BILITOT 0.6  PROT 6.8  ALBUMIN 3.6   No results for input(s): "LIPASE", "AMYLASE" in the last 168 hours. No results for input(s): "AMMONIA" in the last 168 hours. CBC: Recent Labs  Lab 11/04/21 1728 11/05/21 0416 11/06/21 0829  WBC 17.2* 14.3* 10.6*  NEUTROABS 14.4*  --  8.2*  HGB 13.2 13.2 13.8  HCT 41.0 40.0 42.2  MCV 91.1 89.7 89.0  PLT 188 204 211   Cardiac Enzymes: No results for input(s): "CKTOTAL", "CKMB", "CKMBINDEX", "TROPONINI" in the last 168 hours. BNP: Invalid input(s): "POCBNP" CBG: No results for input(s): "GLUCAP" in the last 168 hours. D-Dimer No results for input(s): "DDIMER" in the last 72 hours. Hgb A1c No results for input(s): "HGBA1C" in the last 72 hours. Lipid Profile No results for input(s): "CHOL", "HDL", "LDLCALC", "TRIG", "CHOLHDL", "LDLDIRECT" in the last 72 hours. Thyroid function studies No results for input(s): "TSH", "T4TOTAL", "T3FREE", "THYROIDAB" in the last 72 hours.  Invalid input(s): "FREET3" Anemia work up No results for input(s): "VITAMINB12", "FOLATE", "FERRITIN", "TIBC", "IRON", "RETICCTPCT" in the last 72 hours. Urinalysis    Component Value Date/Time   COLORURINE YELLOW (A) 02/10/2019 1136   APPEARANCEUR CLEAR (A) 02/10/2019 1136   APPEARANCEUR Slightly cloudy 05/22/2017 0945  LABSPEC 1.004 (L) 02/10/2019 1136   LABSPEC 1.004 11/09/2012 1309   PHURINE 6.0 02/10/2019 1136   GLUCOSEU NEGATIVE 02/10/2019 1136   GLUCOSEU Negative 11/09/2012 1309   HGBUR NEGATIVE 02/10/2019 1136   BILIRUBINUR negative 10/01/2021 1441   BILIRUBINUR negative 11/05/2017 1559   BILIRUBINUR Negative 05/22/2017 0945   BILIRUBINUR Negative 11/09/2012 1309   KETONESUR negative 10/01/2021 1441   KETONESUR NEGATIVE 02/10/2019 1136   PROTEINUR negative 10/01/2021 1441   PROTEINUR NEGATIVE 02/10/2019 1136   UROBILINOGEN 0.2 10/01/2021 1441   NITRITE  Positive (A) 10/01/2021 1441   NITRITE NEGATIVE 02/10/2019 1136   LEUKOCYTESUR Large (3+) (A) 10/01/2021 1441   LEUKOCYTESUR SMALL (A) 02/10/2019 1136   LEUKOCYTESUR Negative 11/09/2012 1309   Sepsis Labs Recent Labs  Lab 11/04/21 1728 11/05/21 0416 11/06/21 0829  WBC 17.2* 14.3* 10.6*   Microbiology No results found for this or any previous visit (from the past 240 hour(s)).   Time coordinating discharge: Over 30 minutes  SIGNED:   Sidney Ace, MD  [facility] 11/08/2021, 11:19 AM Pager   If 7PM-7AM, please contact night-coverage

## 2021-11-08 NOTE — Telephone Encounter (Signed)
Patient has been scheduled for a HFU for July 25th at 11am.

## 2021-11-08 NOTE — Plan of Care (Signed)
  Problem: Education: Goal: Knowledge of General Education information will improve Description: Including pain rating scale, medication(s)/side effects and non-pharmacologic comfort measures 11/08/2021 1048 by Manroop Jakubowicz Bet, LPN Outcome: Adequate for Discharge 11/08/2021 1048 by Martise Waddell Bet, LPN Outcome: Progressing   Problem: Health Behavior/Discharge Planning: Goal: Ability to manage health-related needs will improve 11/08/2021 1048 by Keefe Zawistowski Bet, LPN Outcome: Adequate for Discharge 11/08/2021 1048 by Caellum Mancil Bet, LPN Outcome: Progressing   Problem: Clinical Measurements: Goal: Ability to maintain clinical measurements within normal limits will improve 11/08/2021 1048 by Aryani Daffern Bet, LPN Outcome: Adequate for Discharge 11/08/2021 1048 by Shean Gerding Bet, LPN Outcome: Progressing Goal: Will remain free from infection 11/08/2021 1048 by Zamantha Strebel Bet, LPN Outcome: Adequate for Discharge 11/08/2021 1048 by Sharena Dibenedetto Bet, LPN Outcome: Progressing Goal: Diagnostic test results will improve 11/08/2021 1048 by Tejon Gracie Bet, LPN Outcome: Adequate for Discharge 11/08/2021 1048 by Niko Penson Bet, LPN Outcome: Progressing Goal: Respiratory complications will improve 11/08/2021 1048 by Airyn Ellzey Bet, LPN Outcome: Adequate for Discharge 11/08/2021 1048 by Allahna Husband Bet, LPN Outcome: Progressing Goal: Cardiovascular complication will be avoided 11/08/2021 1048 by Gali Spinney Bet, LPN Outcome: Adequate for Discharge 11/08/2021 1048 by Torianne Laflam Bet, LPN Outcome: Progressing   Problem: Activity: Goal: Risk for activity intolerance will decrease 11/08/2021 1048 by Adams Hinch Bet, LPN Outcome: Adequate for Discharge 11/08/2021 1048 by Jann Ra Bet, LPN Outcome: Progressing   Problem: Nutrition: Goal: Adequate nutrition will be maintained 11/08/2021 1048 by Reanna Scoggin Bet, LPN Outcome: Adequate for Discharge 11/08/2021 1048 by Nishawn Rotan Bet, LPN Outcome: Progressing    Problem: Coping: Goal: Level of anxiety will decrease 11/08/2021 1048 by Zriyah Kopplin Bet, LPN Outcome: Adequate for Discharge 11/08/2021 1048 by Sabrena Gavitt Bet, LPN Outcome: Progressing   Problem: Elimination: Goal: Will not experience complications related to bowel motility 11/08/2021 1048 by Quintana Canelo Bet, LPN Outcome: Adequate for Discharge 11/08/2021 1048 by Shavone Nevers Bet, LPN Outcome: Progressing Goal: Will not experience complications related to urinary retention 11/08/2021 1048 by Celena Lanius Bet, LPN Outcome: Adequate for Discharge 11/08/2021 1048 by Mercades Bajaj Bet, LPN Outcome: Progressing   Problem: Pain Managment: Goal: General experience of comfort will improve 11/08/2021 1048 by Charice Zuno Bet, LPN Outcome: Adequate for Discharge 11/08/2021 1048 by Darely Becknell Bet, LPN Outcome: Progressing   Problem: Safety: Goal: Ability to remain free from injury will improve 11/08/2021 1048 by Aleiyah Halpin Bet, LPN Outcome: Adequate for Discharge 11/08/2021 1048 by Chrisma Hurlock Bet, LPN Outcome: Progressing   Problem: Skin Integrity: Goal: Risk for impaired skin integrity will decrease 11/08/2021 1048 by Maridee Slape Bet, LPN Outcome: Adequate for Discharge 11/08/2021 1048 by Aylee Littrell Bet, LPN Outcome: Progressing   Problem: Acute Rehab OT Goals (only OT should resolve) Goal: Pt. Will Perform Grooming Outcome: Adequate for Discharge Goal: Pt. Will Perform Lower Body Dressing Outcome: Adequate for Discharge Goal: Pt. Will Transfer To Toilet Outcome: Adequate for Discharge Goal: Pt. Will Perform Toileting-Clothing Manipulation Outcome: Adequate for Discharge

## 2021-11-08 NOTE — Plan of Care (Signed)

## 2021-11-08 NOTE — Progress Notes (Signed)
  Transition of Care [facility]) Screening Note   Patient Details  Name: Theresa Reid Date of Birth: 07-Apr-1941   Transition of Care [facility]) CM/SW Contact:    Conception Oms, RN Phone Number: 11/08/2021, 10:04 AM    Transition of Care Department [facility]) has reviewed patient and no TOC needs have been identified at this time. We will continue to monitor patient advancement through interdisciplinary progression rounds. If new patient transition needs arise, please place a TOC consult.

## 2021-11-08 NOTE — Telephone Encounter (Signed)
Please let her daughter know that I sent a mychart message yesterday with date options and a follow up message this morning. I can proceed with scheduling once Ms Sanborn messages me back. Please let her know I am out of the office for training today. Thanks.

## 2021-11-08 NOTE — Progress Notes (Signed)
Discharge instructions reviewed with pt and daughter. They both verbalized understanding of instructions.

## 2021-11-09 ENCOUNTER — Other Ambulatory Visit: Payer: Self-pay

## 2021-11-09 DIAGNOSIS — Z01818 Encounter for other preprocedural examination: Secondary | ICD-10-CM

## 2021-11-09 NOTE — Telephone Encounter (Signed)
Left message on pt's # to return call to discuss surgery information. Also need to verify w/ pt that it's ok to speak with Tiffany.

## 2021-11-09 NOTE — Telephone Encounter (Signed)
I spoke with Ms Theda Sers. She states it is Ok to speak w/ Tiffany about her care.   Below is a summary of what we discussed:  Planned surgery: C1-2 posterior spinal fusion with open reduction internal fixation (ORIF) C2 fracture   Surgery date: 11/16/21 - you will find out your arrival time the business day before your surgery.   NSAIDS (Non-steroidal anti-inflammatory drugs): because you are having a fusion, no NSAIDS (such as ibuprofen, aleve, naproxen, meloxicam, diclofenac) for 3 months after surgery. Celebrex is an exception. Tylenol is ok because it is not an NSAID.   Pre-op appointment at [facility]: we will call you with a date/time for this. Pre-admit testing is located on the first floor of the [facility] building, [address].   Pre-op labs will be done at your pre-op appointment. You are not required to fast for these labs.    Covid testing:  a pre-op Covid test is not required unless you are symptomatic or exposed.    Should you need to change your pre-op appointment, please call Pre-admit testing at [phone].     Brace: You will need to bring the brace to the hospital on the day of surgery.   If you are having a fusion or arthroplasty procedure: for appointments after your 2 week follow-up: please arrive at the [facility] outpatient imaging center ([place], [place]) or [facility] one hour prior to your appointment for x-rays. This applies to every appointment after your 2 week follow-up. Failure to do so may result in your appointment being rescheduled.   If you have FMLA/disability paperwork, please drop it off or fax it to [phone], attention [place].   If you have any questions/concerns before or after surgery, you can reach [place] at [phone], or you can send a mychart message. If you have a concern after hours that cannot wait until normal business hours, you can call [phone] or  [phone] and ask the answering service to page the neurosurgeon on call.    Appointments/FMLA & disability paperwork: [place] Nurse: Ophelia Shoulder  Medical assistant: Raquel Sarna Surgeon: Dr Meade Maw Physician Assistant: Cooper Render

## 2021-11-10 ENCOUNTER — Telehealth: Payer: Self-pay

## 2021-11-10 ENCOUNTER — Ambulatory Visit: Payer: Medicare Other | Admitting: Radiation Oncology

## 2021-11-10 NOTE — Telephone Encounter (Signed)
Transition Care Management Unsuccessful Follow-up Telephone Call  Date of discharge and from where:  11/08/21  Attempts:  1st Attempt  Reason for unsuccessful TCM follow-up call:  Left voice message. Will follow. HFU scheduled 11/15/21 @ 11:00. Keep all scheduled appointments.

## 2021-11-11 NOTE — Telephone Encounter (Signed)
Transition Care Management Unsuccessful Follow-up Telephone Call  Date of discharge and from where:  11/08/21 [facility]  Attempts:  2nd Attempt  Reason for unsuccessful TCM follow-up call:  Left voice message. Will follow. HFU scheduled 11/15/21 @ 11:00. Keep all scheduled appointments.

## 2021-11-14 ENCOUNTER — Other Ambulatory Visit: Payer: Self-pay

## 2021-11-14 ENCOUNTER — Encounter
Admission: RE | Admit: 2021-11-14 | Discharge: 2021-11-14 | Disposition: A | Discharge status: Home / Self Care | Payer: Medicare Other | Source: Ambulatory Visit | Attending: Neurosurgery | Admitting: Neurosurgery

## 2021-11-14 VITALS — BP 127/54 | HR 88 | Resp 16 | Ht 64.0 in | Wt 192.0 lb

## 2021-11-14 DIAGNOSIS — Z01812 Encounter for preprocedural laboratory examination: Secondary | ICD-10-CM | POA: Insufficient documentation

## 2021-11-14 DIAGNOSIS — Z01818 Encounter for other preprocedural examination: Secondary | ICD-10-CM

## 2021-11-14 DIAGNOSIS — M532X2 Spinal instabilities, cervical region: Secondary | ICD-10-CM | POA: Diagnosis not present

## 2021-11-14 DIAGNOSIS — Z9049 Acquired absence of other specified parts of digestive tract: Secondary | ICD-10-CM | POA: Diagnosis not present

## 2021-11-14 DIAGNOSIS — Z79899 Other long term (current) drug therapy: Secondary | ICD-10-CM | POA: Diagnosis not present

## 2021-11-14 DIAGNOSIS — Z9221 Personal history of antineoplastic chemotherapy: Secondary | ICD-10-CM | POA: Diagnosis not present

## 2021-11-14 DIAGNOSIS — M1990 Unspecified osteoarthritis, unspecified site: Secondary | ICD-10-CM | POA: Diagnosis not present

## 2021-11-14 DIAGNOSIS — E785 Hyperlipidemia, unspecified: Secondary | ICD-10-CM | POA: Diagnosis not present

## 2021-11-14 DIAGNOSIS — Z96653 Presence of artificial knee joint, bilateral: Secondary | ICD-10-CM | POA: Diagnosis not present

## 2021-11-14 DIAGNOSIS — Z923 Personal history of irradiation: Secondary | ICD-10-CM | POA: Diagnosis not present

## 2021-11-14 DIAGNOSIS — I10 Essential (primary) hypertension: Secondary | ICD-10-CM | POA: Diagnosis not present

## 2021-11-14 DIAGNOSIS — S12111A Posterior displaced Type II dens fracture, initial encounter for closed fracture: Secondary | ICD-10-CM | POA: Diagnosis not present

## 2021-11-14 DIAGNOSIS — Z79811 Long term (current) use of aromatase inhibitors: Secondary | ICD-10-CM | POA: Diagnosis not present

## 2021-11-14 DIAGNOSIS — Z9011 Acquired absence of right breast and nipple: Secondary | ICD-10-CM | POA: Diagnosis not present

## 2021-11-14 DIAGNOSIS — Z853 Personal history of malignant neoplasm of breast: Secondary | ICD-10-CM | POA: Diagnosis not present

## 2021-11-14 DIAGNOSIS — Z803 Family history of malignant neoplasm of breast: Secondary | ICD-10-CM | POA: Diagnosis not present

## 2021-11-14 DIAGNOSIS — Z8261 Family history of arthritis: Secondary | ICD-10-CM | POA: Diagnosis not present

## 2021-11-14 DIAGNOSIS — K219 Gastro-esophageal reflux disease without esophagitis: Secondary | ICD-10-CM | POA: Diagnosis not present

## 2021-11-14 HISTORY — DX: Prediabetes: R73.03

## 2021-11-14 HISTORY — DX: Lymphedema, not elsewhere classified: I89.0

## 2021-11-14 LAB — TYPE AND SCREEN
ABO/RH(D): A NEG
Antibody Screen: NEGATIVE

## 2021-11-14 LAB — SURGICAL PCR SCREEN
MRSA, PCR: NEGATIVE
Staphylococcus aureus: NEGATIVE

## 2021-11-14 NOTE — Patient Instructions (Addendum)
Your procedure is scheduled on:11-16-21 Wednesday Report to the Registration Desk on the 1st floor of the [facility].Then proceed to the 2nd floor  To find out your arrival time, please call [phone] between 1PM - 3PM on:11-15-21 Tuesday If your arrival time is 6:00 am, do not arrive prior to that time as the [place] entrance doors do not open until 6:00 am.  REMEMBER: Instructions that are not followed completely may result in serious medical risk, up to and including death; or upon the discretion of your surgeon and anesthesiologist your surgery may need to be rescheduled.  Do not eat food after midnight the night before surgery.  No gum chewing, lozengers or hard candies.  You may however, drink CLEAR liquids up to 2 hours before you are scheduled to arrive for your surgery. Do not drink anything within 2 hours of your scheduled arrival time.  Clear liquids include: - water  - apple juice without pulp - gatorade (not RED colors) - black coffee or tea (Do NOT add milk or creamers to the coffee or tea) Do NOT drink anything that is not on this list  TAKE THESE MEDICATIONS THE MORNING OF SURGERY WITH A SIP OF WATER: -anastrozole (ARIMIDEX) -oxybutynin (DITROPAN) -gabapentin (NEURONTIN)  -omeprazole (PRILOSEC) -take one the night before and one on the morning of surgery - helps to prevent nausea after surgery.)  One week prior to surgery: Stop Anti-inflammatories (NSAIDS) such as Advil, Aleve, Ibuprofen, Motrin, Naproxen, Naprosyn and Aspirin based products such as Excedrin, Goodys Powder, BC Powder.You may however, take Tylenol/Oxycodone  if needed for pain up until the day of surgery.  Stop ANY OVER THE COUNTER supplements/Vitamins NOW (11-14-21) until after surgery (Calcium-Vitamin D and multivitamin)  No Alcohol for 24 hours before or after surgery.  No Smoking including e-cigarettes for 24 hours prior to surgery.  No chewable tobacco products for at least 6 hours prior  to surgery.  No nicotine patches on the day of surgery.  Do not use any "recreational" drugs for at least a week prior to your surgery.  Please be advised that the combination of cocaine and anesthesia may have negative outcomes, up to and including death. If you test positive for cocaine, your surgery will be cancelled.  On the morning of surgery brush your teeth with toothpaste and water, you may rinse your mouth with mouthwash if you wish. Do not swallow any toothpaste or mouthwash.  Use CHG Soap as directed on instruction sheet.  Do not wear jewelry, make-up, hairpins, clips or nail polish.  Do not wear lotions, powders, or perfumes.   Do not shave body from the neck down 48 hours prior to surgery just in case you cut yourself which could leave a site for infection.  Also, freshly shaved skin may become irritated if using the CHG soap.  Contact lenses, hearing aids and dentures may not be worn into surgery.  Do not bring valuables to the hospital. [facility] is not responsible for any missing/lost belongings or valuables.   Notify your doctor if there is any change in your medical condition (cold, fever, infection).  Wear comfortable clothing (specific to your surgery type) to the hospital.  After surgery, you can help prevent lung complications by doing breathing exercises.  Take deep breaths and cough every 1-2 hours. Your doctor may order a device called an Incentive Spirometer to help you take deep breaths. When coughing or sneezing, hold a pillow firmly against your incision with both hands. This  is called "splinting." Doing this helps protect your incision. It also decreases belly discomfort.  If you are being admitted to the hospital overnight, leave your suitcase in the car. After surgery it may be brought to your room.  If you are being discharged the day of surgery, you will not be allowed to drive home. You will need a responsible adult (18 years or older) to drive  you home and stay with you that night.   If you are taking public transportation, you will need to have a responsible adult (18 years or older) with you. Please confirm with your physician that it is acceptable to use public transportation.   Please call the [place] Dept. at [phone] if you have any questions about these instructions.  Surgery Visitation Policy:  Patients undergoing a surgery or procedure may have two family members or support persons with them as long as the person is not COVID-19 positive or experiencing its symptoms.   Inpatient Visitation:    Visiting hours are 7 a.m. to 8 p.m. Up to four visitors are allowed at one time in a patient room, including children. The visitors may rotate out with other people during the day. One designated support person (adult) may remain overnight.

## 2021-11-14 NOTE — Telephone Encounter (Signed)
Transition Care Management Unsuccessful Follow-up Telephone Call  Date of discharge and from where:  11/08/21 [facility]  Attempts:  3rd Attempt  Reason for unsuccessful TCM follow-up call:  Unable to reach patient. HFU scheduled 11/15/21 @ 11:00. Keep all scheduled appointments. No further phone call follow up from nurse at this time. TCM Closed.

## 2021-11-15 ENCOUNTER — Inpatient Hospital Stay: Payer: Medicare Other | Admitting: Family Medicine

## 2021-11-16 ENCOUNTER — Other Ambulatory Visit: Payer: Self-pay

## 2021-11-16 ENCOUNTER — Inpatient Hospital Stay: Payer: Medicare Other | Admitting: Certified Registered Nurse Anesthetist

## 2021-11-16 ENCOUNTER — Encounter
Admission: RE | Disposition: A | Discharge status: Home / Self Care | Payer: Self-pay | Source: Ambulatory Visit | Attending: Neurosurgery

## 2021-11-16 ENCOUNTER — Inpatient Hospital Stay: Payer: Medicare Other | Admitting: Urgent Care

## 2021-11-16 ENCOUNTER — Ambulatory Visit: Payer: Medicare Other | Admitting: Radiation Oncology

## 2021-11-16 ENCOUNTER — Inpatient Hospital Stay: Payer: Medicare Other

## 2021-11-16 ENCOUNTER — Inpatient Hospital Stay
Admission: RE | Admit: 2021-11-16 | Discharge: 2021-11-17 | DRG: 473 | Disposition: A | Discharge status: Home / Self Care | Payer: Medicare Other | Attending: Neurosurgery | Admitting: Neurosurgery

## 2021-11-16 DIAGNOSIS — Z9049 Acquired absence of other specified parts of digestive tract: Secondary | ICD-10-CM | POA: Diagnosis not present

## 2021-11-16 DIAGNOSIS — Z803 Family history of malignant neoplasm of breast: Secondary | ICD-10-CM

## 2021-11-16 DIAGNOSIS — W19XXXA Unspecified fall, initial encounter: Secondary | ICD-10-CM | POA: Diagnosis present

## 2021-11-16 DIAGNOSIS — Z853 Personal history of malignant neoplasm of breast: Secondary | ICD-10-CM

## 2021-11-16 DIAGNOSIS — E785 Hyperlipidemia, unspecified: Secondary | ICD-10-CM | POA: Diagnosis not present

## 2021-11-16 DIAGNOSIS — Z96653 Presence of artificial knee joint, bilateral: Secondary | ICD-10-CM | POA: Diagnosis present

## 2021-11-16 DIAGNOSIS — M542 Cervicalgia: Secondary | ICD-10-CM | POA: Diagnosis present

## 2021-11-16 DIAGNOSIS — S12111A Posterior displaced Type II dens fracture, initial encounter for closed fracture: Secondary | ICD-10-CM | POA: Diagnosis present

## 2021-11-16 DIAGNOSIS — M1990 Unspecified osteoarthritis, unspecified site: Secondary | ICD-10-CM | POA: Diagnosis present

## 2021-11-16 DIAGNOSIS — M4322 Fusion of spine, cervical region: Secondary | ICD-10-CM | POA: Diagnosis not present

## 2021-11-16 DIAGNOSIS — I10 Essential (primary) hypertension: Secondary | ICD-10-CM | POA: Diagnosis present

## 2021-11-16 DIAGNOSIS — S21111A Laceration without foreign body of right front wall of thorax without penetration into thoracic cavity, initial encounter: Secondary | ICD-10-CM | POA: Diagnosis not present

## 2021-11-16 DIAGNOSIS — Z79899 Other long term (current) drug therapy: Secondary | ICD-10-CM

## 2021-11-16 DIAGNOSIS — Z79811 Long term (current) use of aromatase inhibitors: Secondary | ICD-10-CM | POA: Diagnosis not present

## 2021-11-16 DIAGNOSIS — Z9011 Acquired absence of right breast and nipple: Secondary | ICD-10-CM | POA: Diagnosis not present

## 2021-11-16 DIAGNOSIS — K219 Gastro-esophageal reflux disease without esophagitis: Secondary | ICD-10-CM | POA: Diagnosis present

## 2021-11-16 DIAGNOSIS — M532X2 Spinal instabilities, cervical region: Secondary | ICD-10-CM | POA: Diagnosis present

## 2021-11-16 DIAGNOSIS — Z981 Arthrodesis status: Principal | ICD-10-CM

## 2021-11-16 DIAGNOSIS — Z923 Personal history of irradiation: Secondary | ICD-10-CM

## 2021-11-16 DIAGNOSIS — Z9221 Personal history of antineoplastic chemotherapy: Secondary | ICD-10-CM

## 2021-11-16 DIAGNOSIS — S12110A Anterior displaced Type II dens fracture, initial encounter for closed fracture: Secondary | ICD-10-CM | POA: Diagnosis not present

## 2021-11-16 DIAGNOSIS — S12111K Posterior displaced Type II dens fracture, subsequent encounter for fracture with nonunion: Secondary | ICD-10-CM | POA: Diagnosis not present

## 2021-11-16 DIAGNOSIS — Z01818 Encounter for other preprocedural examination: Secondary | ICD-10-CM

## 2021-11-16 DIAGNOSIS — Z8261 Family history of arthritis: Secondary | ICD-10-CM

## 2021-11-16 HISTORY — PX: APPLICATION OF INTRAOPERATIVE CT SCAN: SHX6668

## 2021-11-16 HISTORY — PX: POSTERIOR CERVICAL FUSION/FORAMINOTOMY: SHX5038

## 2021-11-16 LAB — ABO/RH: ABO/RH(D): A NEG

## 2021-11-16 SURGERY — POSTERIOR CERVICAL FUSION/FORAMINOTOMY LEVEL 1
Anesthesia: General

## 2021-11-16 MED ORDER — ROCURONIUM BROMIDE 10 MG/ML (PF) SYRINGE
PREFILLED_SYRINGE | INTRAVENOUS | Status: AC
  Filled 2021-11-16: qty 10

## 2021-11-16 MED ORDER — GABAPENTIN 300 MG PO CAPS
300.0000 mg | ORAL_CAPSULE | Freq: Three times a day (TID) | ORAL | Status: DC
  Administered 2021-11-16 – 2021-11-17 (×3): 300 mg via ORAL
  Filled 2021-11-16 (×3): qty 1

## 2021-11-16 MED ORDER — OXYCODONE HCL 5 MG PO TABS
ORAL_TABLET | ORAL | Status: AC
  Filled 2021-11-16: qty 1

## 2021-11-16 MED ORDER — METHOCARBAMOL 500 MG PO TABS: 500.0000 mg | ORAL_TABLET | Freq: Four times a day (QID) | ORAL | Status: DC | PRN

## 2021-11-16 MED ORDER — SODIUM CHLORIDE 0.9% FLUSH
3.0000 mL | Freq: Two times a day (BID) | INTRAVENOUS | Status: DC
  Administered 2021-11-16 – 2021-11-17 (×2): 3 mL via INTRAVENOUS

## 2021-11-16 MED ORDER — CHLORHEXIDINE GLUCONATE 0.12 % MT SOLN
OROMUCOSAL | Status: AC
  Administered 2021-11-16: 15 mL via OROMUCOSAL
  Filled 2021-11-16: qty 15

## 2021-11-16 MED ORDER — 0.9 % SODIUM CHLORIDE (POUR BTL) OPTIME
TOPICAL | Status: DC | PRN
  Administered 2021-11-16: 300 mL

## 2021-11-16 MED ORDER — ENOXAPARIN SODIUM 40 MG/0.4ML IJ SOSY
40.0000 mg | PREFILLED_SYRINGE | INTRAMUSCULAR | Status: DC
  Administered 2021-11-17: 40 mg via SUBCUTANEOUS
  Filled 2021-11-16: qty 0.4

## 2021-11-16 MED ORDER — CEFAZOLIN SODIUM-DEXTROSE 2-4 GM/100ML-% IV SOLN
INTRAVENOUS | Status: AC
  Filled 2021-11-16: qty 100

## 2021-11-16 MED ORDER — KETAMINE HCL 10 MG/ML IJ SOLN
INTRAMUSCULAR | Status: DC | PRN
  Administered 2021-11-16: 20 mg via INTRAVENOUS

## 2021-11-16 MED ORDER — KETOROLAC TROMETHAMINE 15 MG/ML IJ SOLN
INTRAMUSCULAR | Status: AC
  Filled 2021-11-16: qty 1

## 2021-11-16 MED ORDER — ROCURONIUM BROMIDE 100 MG/10ML IV SOLN
INTRAVENOUS | Status: DC | PRN
  Administered 2021-11-16: 10 mg via INTRAVENOUS
  Administered 2021-11-16: 60 mg via INTRAVENOUS

## 2021-11-16 MED ORDER — BUPIVACAINE-EPINEPHRINE (PF) 0.5% -1:200000 IJ SOLN
INTRAMUSCULAR | Status: DC | PRN
  Administered 2021-11-16: 10 mL via PERINEURAL

## 2021-11-16 MED ORDER — MORPHINE SULFATE (PF) 2 MG/ML IV SOLN: 2.0000 mg | INTRAVENOUS | Status: DC | PRN

## 2021-11-16 MED ORDER — ORAL CARE MOUTH RINSE: 15.0000 mL | Freq: Once | OROMUCOSAL | Status: AC

## 2021-11-16 MED ORDER — OXYCODONE HCL 5 MG PO TABS: 10.0000 mg | ORAL_TABLET | ORAL | Status: DC | PRN

## 2021-11-16 MED ORDER — ONDANSETRON HCL 4 MG/2ML IJ SOLN: 4.0000 mg | Freq: Once | INTRAMUSCULAR | Status: DC | PRN

## 2021-11-16 MED ORDER — OXYCODONE HCL 5 MG PO TABS
5.0000 mg | ORAL_TABLET | Freq: Once | ORAL | Status: AC | PRN
  Administered 2021-11-16: 5 mg via ORAL

## 2021-11-16 MED ORDER — PHENOL 1.4 % MT LIQD: 1.0000 | OROMUCOSAL | Status: DC | PRN

## 2021-11-16 MED ORDER — LACTATED RINGERS IV SOLN: INTRAVENOUS | Status: DC

## 2021-11-16 MED ORDER — FLEET ENEMA 7-19 GM/118ML RE ENEM: 1.0000 | ENEMA | Freq: Once | RECTAL | Status: DC | PRN

## 2021-11-16 MED ORDER — BUPIVACAINE HCL (PF) 0.5 % IJ SOLN
INTRAMUSCULAR | Status: AC
  Filled 2021-11-16: qty 30

## 2021-11-16 MED ORDER — VASOPRESSIN 20 UNIT/ML IV SOLN
INTRAVENOUS | Status: DC | PRN
  Administered 2021-11-16: 2 [IU] via INTRAVENOUS
  Administered 2021-11-16 (×2): 1 [IU] via INTRAVENOUS
  Administered 2021-11-16: .5 [IU] via INTRAVENOUS

## 2021-11-16 MED ORDER — SODIUM CHLORIDE 0.9% FLUSH: 3.0000 mL | INTRAVENOUS | Status: DC | PRN

## 2021-11-16 MED ORDER — LIDOCAINE HCL (PF) 2 % IJ SOLN
INTRAMUSCULAR | Status: AC
  Filled 2021-11-16: qty 5

## 2021-11-16 MED ORDER — DOCUSATE SODIUM 100 MG PO CAPS
100.0000 mg | ORAL_CAPSULE | Freq: Two times a day (BID) | ORAL | Status: DC
  Administered 2021-11-16 – 2021-11-17 (×2): 100 mg via ORAL
  Filled 2021-11-16 (×2): qty 1

## 2021-11-16 MED ORDER — MENTHOL 3 MG MT LOZG: 1.0000 | LOZENGE | OROMUCOSAL | Status: DC | PRN

## 2021-11-16 MED ORDER — EPHEDRINE 5 MG/ML INJ
INTRAVENOUS | Status: AC
Start: 2021-11-16 — End: ?
  Filled 2021-11-16: qty 10

## 2021-11-16 MED ORDER — PANTOPRAZOLE SODIUM 40 MG PO TBEC
40.0000 mg | DELAYED_RELEASE_TABLET | Freq: Every day | ORAL | Status: DC
  Administered 2021-11-17: 40 mg via ORAL
  Filled 2021-11-16: qty 1

## 2021-11-16 MED ORDER — BUPIVACAINE LIPOSOME 1.3 % IJ SUSP
INTRAMUSCULAR | Status: AC
  Filled 2021-11-16: qty 20

## 2021-11-16 MED ORDER — KETOROLAC TROMETHAMINE 15 MG/ML IJ SOLN
7.5000 mg | Freq: Four times a day (QID) | INTRAMUSCULAR | Status: AC
  Administered 2021-11-16 – 2021-11-17 (×4): 7.5 mg via INTRAVENOUS
  Filled 2021-11-16 (×3): qty 1

## 2021-11-16 MED ORDER — ONDANSETRON HCL 4 MG/2ML IJ SOLN
INTRAMUSCULAR | Status: AC
  Filled 2021-11-16: qty 2

## 2021-11-16 MED ORDER — LACTATED RINGERS IV SOLN: INTRAVENOUS | Status: DC | PRN

## 2021-11-16 MED ORDER — BUPIVACAINE HCL (PF) 0.5 % IJ SOLN
INTRAMUSCULAR | Status: DC | PRN
  Administered 2021-11-16: 20 mL

## 2021-11-16 MED ORDER — ACETAMINOPHEN 500 MG PO TABS
1000.0000 mg | ORAL_TABLET | Freq: Four times a day (QID) | ORAL | Status: DC
  Administered 2021-11-16 – 2021-11-17 (×3): 1000 mg via ORAL
  Filled 2021-11-16 (×3): qty 2

## 2021-11-16 MED ORDER — SODIUM CHLORIDE 0.9 % IV SOLN: 250.0000 mL | INTRAVENOUS | Status: DC

## 2021-11-16 MED ORDER — GLYCOPYRROLATE 0.2 MG/ML IJ SOLN
INTRAMUSCULAR | Status: DC | PRN
  Administered 2021-11-16: .2 mg via INTRAVENOUS

## 2021-11-16 MED ORDER — CEFAZOLIN IN SODIUM CHLORIDE 2-0.9 GM/100ML-% IV SOLN
2.0000 g | Freq: Once | INTRAVENOUS | Status: AC
  Administered 2021-11-16: 2 g via INTRAVENOUS

## 2021-11-16 MED ORDER — BISACODYL 10 MG RE SUPP: 10.0000 mg | Freq: Every day | RECTAL | Status: DC | PRN

## 2021-11-16 MED ORDER — SURGIPHOR WOUND IRRIGATION SYSTEM - OPTIME
TOPICAL | Status: DC | PRN
  Administered 2021-11-16: 450 mL via TOPICAL

## 2021-11-16 MED ORDER — PROPOFOL 10 MG/ML IV BOLUS
INTRAVENOUS | Status: DC | PRN
  Administered 2021-11-16: 120 mg via INTRAVENOUS
  Administered 2021-11-16: 50 mg via INTRAVENOUS

## 2021-11-16 MED ORDER — DEXAMETHASONE SODIUM PHOSPHATE 10 MG/ML IJ SOLN
INTRAMUSCULAR | Status: DC | PRN
  Administered 2021-11-16: 5 mg via INTRAVENOUS

## 2021-11-16 MED ORDER — SURGIFLO WITH THROMBIN (HEMOSTATIC MATRIX KIT) OPTIME
TOPICAL | Status: DC | PRN
  Administered 2021-11-16: 1 via TOPICAL

## 2021-11-16 MED ORDER — METHOCARBAMOL 1000 MG/10ML IJ SOLN
500.0000 mg | Freq: Four times a day (QID) | INTRAVENOUS | Status: DC | PRN
  Administered 2021-11-16: 500 mg via INTRAVENOUS
  Filled 2021-11-16: qty 5

## 2021-11-16 MED ORDER — OXYCODONE HCL 5 MG PO TABS: 5.0000 mg | ORAL_TABLET | ORAL | Status: DC | PRN

## 2021-11-16 MED ORDER — DEXAMETHASONE SODIUM PHOSPHATE 10 MG/ML IJ SOLN
INTRAMUSCULAR | Status: AC
  Filled 2021-11-16: qty 1

## 2021-11-16 MED ORDER — AMLODIPINE BESYLATE 5 MG PO TABS
5.0000 mg | ORAL_TABLET | Freq: Every day | ORAL | Status: DC
  Administered 2021-11-17: 5 mg via ORAL
  Filled 2021-11-16: qty 1

## 2021-11-16 MED ORDER — LIDOCAINE HCL (CARDIAC) PF 100 MG/5ML IV SOSY
PREFILLED_SYRINGE | INTRAVENOUS | Status: DC | PRN
  Administered 2021-11-16: 80 mg via INTRAVENOUS

## 2021-11-16 MED ORDER — FENTANYL CITRATE (PF) 100 MCG/2ML IJ SOLN
INTRAMUSCULAR | Status: DC | PRN
  Administered 2021-11-16: 100 ug via INTRAVENOUS

## 2021-11-16 MED ORDER — ATORVASTATIN CALCIUM 20 MG PO TABS
80.0000 mg | ORAL_TABLET | Freq: Every day | ORAL | Status: DC
  Administered 2021-11-16 – 2021-11-17 (×2): 80 mg via ORAL
  Filled 2021-11-16 (×2): qty 4

## 2021-11-16 MED ORDER — OXYBUTYNIN CHLORIDE 5 MG PO TABS
5.0000 mg | ORAL_TABLET | Freq: Two times a day (BID) | ORAL | Status: DC
  Administered 2021-11-16 – 2021-11-17 (×2): 5 mg via ORAL
  Filled 2021-11-16 (×2): qty 1

## 2021-11-16 MED ORDER — ONDANSETRON HCL 4 MG PO TABS: 4.0000 mg | ORAL_TABLET | Freq: Four times a day (QID) | ORAL | Status: DC | PRN

## 2021-11-16 MED ORDER — KETAMINE HCL 50 MG/5ML IJ SOSY
PREFILLED_SYRINGE | INTRAMUSCULAR | Status: AC
  Filled 2021-11-16: qty 5

## 2021-11-16 MED ORDER — CEFAZOLIN SODIUM-DEXTROSE 2-3 GM-%(50ML) IV SOLR
INTRAVENOUS | Status: DC | PRN
  Administered 2021-11-16: 2 g via INTRAVENOUS

## 2021-11-16 MED ORDER — PHENYLEPHRINE HCL (PRESSORS) 10 MG/ML IV SOLN
INTRAVENOUS | Status: DC | PRN
  Administered 2021-11-16 (×6): 160 ug via INTRAVENOUS

## 2021-11-16 MED ORDER — FENTANYL CITRATE (PF) 100 MCG/2ML IJ SOLN
INTRAMUSCULAR | Status: AC
  Filled 2021-11-16: qty 2

## 2021-11-16 MED ORDER — PHENYLEPHRINE HCL-NACL 20-0.9 MG/250ML-% IV SOLN
INTRAVENOUS | Status: DC | PRN
  Administered 2021-11-16: 25 ug/min via INTRAVENOUS

## 2021-11-16 MED ORDER — ANASTROZOLE 1 MG PO TABS
1.0000 mg | ORAL_TABLET | ORAL | Status: DC
  Administered 2021-11-17: 1 mg via ORAL
  Filled 2021-11-16: qty 1

## 2021-11-16 MED ORDER — POLYETHYLENE GLYCOL 3350 17 G PO PACK: 17.0000 g | PACK | Freq: Every day | ORAL | Status: DC | PRN

## 2021-11-16 MED ORDER — ACETAMINOPHEN 10 MG/ML IV SOLN
INTRAVENOUS | Status: DC | PRN
  Administered 2021-11-16: 1000 mg via INTRAVENOUS

## 2021-11-16 MED ORDER — PHENYLEPHRINE 80 MCG/ML (10ML) SYRINGE FOR IV PUSH (FOR BLOOD PRESSURE SUPPORT)
PREFILLED_SYRINGE | INTRAVENOUS | Status: AC
  Filled 2021-11-16: qty 10

## 2021-11-16 MED ORDER — CEFAZOLIN IN SODIUM CHLORIDE 2-0.9 GM/100ML-% IV SOLN
2.0000 g | Freq: Once | INTRAVENOUS | Status: DC
  Filled 2021-11-16: qty 100

## 2021-11-16 MED ORDER — SODIUM CHLORIDE FLUSH 0.9 % IV SOLN
INTRAVENOUS | Status: AC
  Filled 2021-11-16: qty 20

## 2021-11-16 MED ORDER — ONDANSETRON HCL 4 MG/2ML IJ SOLN
INTRAMUSCULAR | Status: DC | PRN
  Administered 2021-11-16: 4 mg via INTRAVENOUS

## 2021-11-16 MED ORDER — CHLORHEXIDINE GLUCONATE 0.12 % MT SOLN: 15.0000 mL | Freq: Once | OROMUCOSAL | Status: AC

## 2021-11-16 MED ORDER — SUGAMMADEX SODIUM 200 MG/2ML IV SOLN
INTRAVENOUS | Status: DC | PRN
  Administered 2021-11-16: 180 mg via INTRAVENOUS

## 2021-11-16 MED ORDER — BUPIVACAINE-EPINEPHRINE (PF) 0.5% -1:200000 IJ SOLN
INTRAMUSCULAR | Status: AC
  Filled 2021-11-16: qty 30

## 2021-11-16 MED ORDER — ONDANSETRON HCL 4 MG/2ML IJ SOLN: 4.0000 mg | Freq: Four times a day (QID) | INTRAMUSCULAR | Status: DC | PRN

## 2021-11-16 MED ORDER — FENTANYL CITRATE (PF) 100 MCG/2ML IJ SOLN
25.0000 ug | INTRAMUSCULAR | Status: DC | PRN
  Administered 2021-11-16 (×3): 25 ug via INTRAVENOUS

## 2021-11-16 MED ORDER — ACETAMINOPHEN 10 MG/ML IV SOLN
INTRAVENOUS | Status: AC
  Filled 2021-11-16: qty 100

## 2021-11-16 MED ORDER — SENNA 8.6 MG PO TABS
1.0000 | ORAL_TABLET | Freq: Two times a day (BID) | ORAL | Status: DC
  Administered 2021-11-16 – 2021-11-17 (×2): 8.6 mg via ORAL
  Filled 2021-11-16 (×2): qty 1

## 2021-11-16 MED ORDER — VASOPRESSIN 20 UNIT/ML IV SOLN
INTRAVENOUS | Status: AC
  Filled 2021-11-16: qty 1

## 2021-11-16 MED ORDER — SODIUM CHLORIDE 0.9 % IV SOLN: INTRAVENOUS | Status: DC

## 2021-11-16 MED ORDER — SODIUM CHLORIDE 0.9 % IV SOLN
INTRAVENOUS | Status: DC | PRN
  Administered 2021-11-16: 40 mL

## 2021-11-16 MED ORDER — FENTANYL CITRATE (PF) 100 MCG/2ML IJ SOLN
INTRAMUSCULAR | Status: AC
  Administered 2021-11-16: 25 ug via INTRAVENOUS
  Filled 2021-11-16: qty 2

## 2021-11-16 SURGICAL SUPPLY — 83 items
ALLOGRAFT BONE FIBER KORE 5 (Bone Implant) ×1 IMPLANT
BASIN KIT SINGLE STR (MISCELLANEOUS) ×2 IMPLANT
BLADE CLIPPER SPEC (BLADE) ×1 IMPLANT
BULB RESERV EVAC DRAIN JP 100C (MISCELLANEOUS) ×1 IMPLANT
BUR NEURO DRILL SOFT 3.0X3.8M (BURR) ×2 IMPLANT
CAP LOCKING (Cap) IMPLANT
CHLORAPREP W/TINT 26 (MISCELLANEOUS) ×4 IMPLANT
CONNECTOR LATERAL SHORT (Connector) ×1 IMPLANT
COUNTER NEEDLE 20/40 LG (NEEDLE) ×2 IMPLANT
DERMABOND ADVANCED (GAUZE/BANDAGES/DRESSINGS) ×1
DERMABOND ADVANCED .7 DNX12 (GAUZE/BANDAGES/DRESSINGS) ×1 IMPLANT
DRAIN CHANNEL JP 10F RND 20C F (MISCELLANEOUS) IMPLANT
DRAPE 3D C-ARM OEC (DRAPES) IMPLANT
DRAPE C ARM PK CFD 31 SPINE (DRAPES) ×1 IMPLANT
DRAPE C-ARMOR (DRAPES) IMPLANT
DRAPE LAPAROTOMY 100X77 ABD (DRAPES) ×2 IMPLANT
DRAPE MICROSCOPE SPINE 48X150 (DRAPES) ×2 IMPLANT
DRAPE SCAN PATIENT (DRAPES) ×2 IMPLANT
DRAPE SURG 17X11 SM STRL (DRAPES) ×2 IMPLANT
DRSG OPSITE POSTOP 3X4 (GAUZE/BANDAGES/DRESSINGS) ×2 IMPLANT
DRSG OPSITE POSTOP 4X6 (GAUZE/BANDAGES/DRESSINGS) ×1 IMPLANT
DRSG TEGADERM 2-3/8X2-3/4 SM (GAUZE/BANDAGES/DRESSINGS) ×1 IMPLANT
ELECT CAUTERY BLADE TIP 2.5 (TIP)
ELECTRODE CAUTERY BLDE TIP 2.5 (TIP) IMPLANT
EX-PIN ORTHOLOCK NAV 4X150 (PIN) IMPLANT
FEE INTRAOP CADWELL SUPPLY NCS (MISCELLANEOUS) IMPLANT
FEE INTRAOP MONITOR IMPULS NCS (MISCELLANEOUS) IMPLANT
GAUZE XEROFORM 1X8 LF (GAUZE/BANDAGES/DRESSINGS) ×1 IMPLANT
GLOVE BIOGEL PI IND STRL 6.5 (GLOVE) ×1 IMPLANT
GLOVE BIOGEL PI IND STRL 8.5 (GLOVE) ×1 IMPLANT
GLOVE BIOGEL PI INDICATOR 6.5 (GLOVE) ×1
GLOVE BIOGEL PI INDICATOR 8.5 (GLOVE) ×1
GLOVE SURG SYN 6.5 ES PF (GLOVE) ×2 IMPLANT
GLOVE SURG SYN 6.5 PF PI (GLOVE) ×1 IMPLANT
GLOVE SURG SYN 8.5  E (GLOVE) ×3
GLOVE SURG SYN 8.5 E (GLOVE) ×3 IMPLANT
GLOVE SURG SYN 8.5 PF PI (GLOVE) ×3 IMPLANT
GOWN SRG LRG LVL 4 IMPRV REINF (GOWNS) ×1 IMPLANT
GOWN SRG XL LVL 3 NONREINFORCE (GOWNS) ×1 IMPLANT
GOWN STRL NON-REIN TWL XL LVL3 (GOWNS) ×1
GOWN STRL REIN LRG LVL4 (GOWNS) ×1
GRADUATE 1200CC STRL 31836 (MISCELLANEOUS) ×2 IMPLANT
HEMOVAC 400CC 10FR (MISCELLANEOUS) ×1 IMPLANT
HOLDER FOLEY CATH W/STRAP (MISCELLANEOUS) ×1 IMPLANT
INTRAOP CADWELL SUPPLY FEE NCS (MISCELLANEOUS)
INTRAOP DISP SUPPLY FEE NCS (MISCELLANEOUS)
INTRAOP MONITOR FEE IMPULS NCS (MISCELLANEOUS)
INTRAOP MONITOR FEE IMPULSE (MISCELLANEOUS)
IV NS 500ML (IV SOLUTION) ×1
IV NS 500ML BAXH (IV SOLUTION) IMPLANT
KIT SPINAL PRONEVIEW (KITS) ×1 IMPLANT
KIT TURNOVER KIT A (KITS) ×2 IMPLANT
LOCKING CAP (Cap) ×10 IMPLANT
MANIFOLD NEPTUNE II (INSTRUMENTS) ×2 IMPLANT
MARKER SKIN DUAL TIP RULER LAB (MISCELLANEOUS) ×4 IMPLANT
MARKER SPHERE PSV REFLC 13MM (MARKER) ×14 IMPLANT
NDL SAFETY ECLIPSE 18X1.5 (NEEDLE) ×1 IMPLANT
NEEDLE HYPO 18GX1.5 SHARP (NEEDLE) ×1
NS IRRIG 1000ML POUR BTL (IV SOLUTION) ×2 IMPLANT
PACK LAMINECTOMY NEURO (CUSTOM PROCEDURE TRAY) ×2 IMPLANT
PAD ARMBOARD 7.5X6 YLW CONV (MISCELLANEOUS) ×4 IMPLANT
PIN MAYFIELD SKULL DISP (PIN) ×1 IMPLANT
ROD SPINAL 3.5D 25L CVD (Rod) ×2 IMPLANT
SCREW QUARTEX 3.5X30 TI (Screw) ×2 IMPLANT
SCREW QUARTEX POLY 3.5X24 (Screw) ×1 IMPLANT
SCREW QUARTEX POLY 3.5X28 (Screw) ×1 IMPLANT
SOLUTION IRRIG SURGIPHOR (IV SOLUTION) ×2 IMPLANT
SPONGE GAUZE 2X2 8PLY STRL LF (GAUZE/BANDAGES/DRESSINGS) ×1 IMPLANT
STAPLER SKIN PROX 35W (STAPLE) ×5 IMPLANT
SURGIFLO W/THROMBIN 8M KIT (HEMOSTASIS) ×2 IMPLANT
SUT ETHILON 3-0 FS-10 30 BLK (SUTURE) ×2
SUT V-LOC 90 ABS DVC 3-0 CL (SUTURE) ×1 IMPLANT
SUT VIC AB 0 CT1 27 (SUTURE)
SUT VIC AB 0 CT1 27XCR 8 STRN (SUTURE) IMPLANT
SUT VIC AB 2-0 CT1 18 (SUTURE) ×2 IMPLANT
SUTURE EHLN 3-0 FS-10 30 BLK (SUTURE) IMPLANT
TAPE CLOTH 3X10 WHT NS LF (GAUZE/BANDAGES/DRESSINGS) ×4 IMPLANT
TOWEL OR 17X26 4PK STRL BLUE (TOWEL DISPOSABLE) ×8 IMPLANT
TRAY FOLEY MTR SLVR 16FR STAT (SET/KITS/TRAYS/PACK) ×1 IMPLANT
TROCAR INSERT W/PEDICLE NDL (TROCAR) IMPLANT
TROCAR INSERT W/PEDICLE NDLE (TROCAR)
TUBING CONNECTING 10 (TUBING) ×2 IMPLANT
WATER STERILE IRR 1000ML POUR (IV SOLUTION) ×4 IMPLANT

## 2021-11-16 NOTE — Interval H&P Note (Signed)
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  The various methods of treatment have been discussed with the patient and family. After consideration of risks, benefits and other options for treatment, the patient has consented to  Procedure(s): C1-2 POSTERIOR FUSION WITH ORIF (OPEN REDUCTION INTERNAL FIXATION) C2 FRACTURE (GLOBUS [place] FIBER) (N/A) APPLICATION OF INTRAOPERATIVE CT SCAN (N/A) as a surgical intervention.  The patient's history has been reviewed, patient examined, no change in status, stable for surgery.  I have reviewed the patient's chart and labs.  Questions were answered to the patient's satisfaction.    Heart sounds normal no MRG. Chest Clear to Auscultation Bilaterally.   Jakoby Melendrez

## 2021-11-16 NOTE — Progress Notes (Signed)
Patient awake/alert x4, moving all ext, able to bend, hand grasps equal. Reviewed procedure with patient, verbalizes understanding.. States she feels her neck/head "numb"  reviewed medication with patient, medicated as prescribed.  Note patient states she feel down 6-7 cement stairs, noted bruises, abrasions bil thigh/groin area.

## 2021-11-16 NOTE — Op Note (Addendum)
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  Due to instability from her fracture, surgery was recommended.    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   Preoperative Note:   Risks of surgery discussed include: infection, bleeding, stroke, coma, death, paralysis, CSF leak, nerve/spinal cord injury, numbness, tingling, weakness, complex regional pain syndrome, recurrent stenosis and/or disc herniation, vascular injury, development of instability, neck/back pain, need for further surgery, persistent symptoms, development of deformity, and the risks of anesthesia. The patient understood these risks and agreed to proceed.  Operative Note:    OPERATIVE PROCEDURE:  1. Posterior Spinal Instrumentation C1-2 using Globus Quartex 2. Posterolateral arthrodesis from C1 to C2  3. Use of allograft to aid in fusion 4. Use of stereotaxis ([employer]lab) 5.  Open reduction and internal fixation of C2 fracture  OPERATIVE PROCEDURE:  After induction of general anesthesia, the patient was placed in the prone position on operating table.  The head was secured.  A midline incision was then planned over the level of C1 and C2.  A timeout was performed, and antibiotics given.  Next, the posterior cervical region was prepped and draped in the usual sterile fashion. The incision was injected with local anesthetic, the opened sharply. A subperiosteal dissection was then carried out to expose the remaining posterior  elements at C1 and C2.  After satisfactory exposure had been obtained, our attention was turned to placement of our C1 lateral mass screws.  Using the #1 [place], a subperiosteal dissection was then carried out to gently peel down the venous plexus in the C2 nerve root off to see the C1 lateral mass.  The venous bleeding was controlled using  a combination of bipolar cautery and FloSeal.  C2 neurectomy was performed bilaterally.  The stereotactic array was placed.  Stereotactic images were acquired and registered to the patient using the [place] system.  Stereotactic guidance was used for cannulation of all screw tracts.  After exposure the lateral masses at C1 bilaterally, a pilot hole was drilled in the middle of the lateral mass using the high-speed drill on each side.  Next, a drill and drill guide were used to cannulate the lateral mass.  The tract was palpated and measured, then a 3.5 x 30 mm smooth-shank screw was placed on the left, followed by placement of a 3.5 x 30 mm smooth-shank screw on the right.  Attention was then turned to placement of our pars screws at C2.  Using the usual anatomic landmarks for C2 pars screw placement, pilot holes were drilled in the pars at C2 on the right to approximately 5 mm depth.  Using the stereotactic drill guide, the pars was cannulated.  A 3.5 x 24 mm screw was then placed.  The left vertebral artery was and suboptimal position for placement of a partial pedicle screw on the left side.  Thus, translaminar screw was placed on the left.  The lamina was cannulated and a 3.5 x 35m screw was placed.    Next, a rod was selected and connected to the C1 and C2 screws using the locking caps.  The  caps were then tightened and locked to their final position.   By reducing the screws to the rod, the C2 fracture was openly reduced and internally fixated.  The remaining cortical surfaces at C1 and C2 were then gently decorticated using the high-speed drill with 3 mm  cutting drill bit.  Our mixture of demineralized bone matrix was then packed over the decorticated surfaces bilaterally.  The wound was closed in layers using 0, 2-0, and 3-0 vicryl, followed by staples on the skin. A sterile bandage was then placed and the patient was awakened from general anesthesia in stable condition moving all extremities.    All counts were correct at the conclusion of the procedure.  Cooper Render PA assisted in the entire procedure. An assistant was required for this procedure due to the complexity.  The assistant provided assistance in tissue manipulation and suction, and was required for the successful and safe performance of the procedure. I performed the critical portions of the procedure.   Meade Maw MD

## 2021-11-16 NOTE — Anesthesia Preprocedure Evaluation (Addendum)
Anesthesia Evaluation  Patient identified by MRN, date of birth, ID band Patient awake    Reviewed: Allergy & Precautions, NPO status , Patient's Chart, lab work & pertinent test results  Airway Mallampati: III  TM Distance: >3 FB Neck ROM: Limited  Mouth opening: Limited Mouth Opening  Dental  (+) Teeth Intact   Pulmonary neg pulmonary ROS,    Pulmonary exam normal breath sounds clear to auscultation       Cardiovascular Exercise Tolerance: Good hypertension, Pt. on medications negative cardio ROS Normal cardiovascular exam Rhythm:Regular     Neuro/Psych negative neurological ROS  negative psych ROS   GI/Hepatic negative GI ROS, Neg liver ROS, GERD  ,  Endo/Other  negative endocrine ROS  Renal/GU negative Renal ROS  negative genitourinary   Musculoskeletal  (+) Arthritis ,   Abdominal Normal abdominal exam  (+)   Peds negative pediatric ROS (+)  Hematology negative hematology ROS (+)   Anesthesia Other Findings Past Medical History: No date: Arthritis No date: Breast cancer (HCC) No date: Diverticulitis No date: Family history of breast cancer No date: GERD (gastroesophageal reflux disease) No date: Hyperlipidemia No date: Hypertension No date: Lymphedema     Comment:  right arm No date: Personal history of chemotherapy No date: Personal history of radiation therapy No date: Pre-diabetes  Past Surgical History: 05/28/2018: BREAST BIOPSY; Right     Comment:  [place] bx, INVASIVE MAMMARY CARCINOMA WITH LOBULAR FEATURES 06/12/2018: BREAST LUMPECTOMY; Right     Comment:  IMC, lobular features 06/12/2018: BREAST LUMPECTOMY WITH SENTINEL LYMPH NODE BIOPSY; Right     Comment:  Procedure: RIGHT BREAST WIDE EXCISION WITH SENTINEL               LYMPH NODE BX;  Surgeon: Robert Bellow, MD;                Location: [facility] ORS;  Service: General;  Laterality:               Right; No date: CHOLECYSTECTOMY 1997:  GALLBLADDER SURGERY 10/30/2018: INCISION AND DRAINAGE OF WOUND; Right     Comment:  Procedure: IRRIGATION AND DEBRIDEMENT RIGHT CHEST WALL               WOUND;  Surgeon: Robert Bellow, MD;  Location: [facility]               ORS;  Service: General;  Laterality: Right; No date: MASTECTOMY; Right 09/20/2018: MASTECTOMY WITH AXILLARY LYMPH NODE DISSECTION; Right     Comment:  Procedure: MASTECTOMY WITH AXILLARY LYMPH NODE               DISSECTION RIGHT;  Surgeon: Robert Bellow, MD;                Location: [facility] ORS;  Service: General;  Laterality:               Right; No date: ovaraian cyst removal; Right     Comment:  Cyst only (NOT OVARY) 09/20/2018: PORTACATH PLACEMENT; Left     Comment:  Procedure: INSERTION PORT-A-CATH, LEFT;  Surgeon:               Robert Bellow, MD;  Location: [facility] ORS;  Service:               General;  Laterality: Left; 05/05/2010: TOTAL KNEE ARTHROPLASTY; Bilateral  BMI    Body Mass Index: 32.96 kg/m      Reproductive/Obstetrics negative OB ROS  Anesthesia Physical Anesthesia Plan  ASA: 3  Anesthesia Plan: General   Post-op Pain Management:    Induction: Intravenous  PONV Risk Score and Plan: Ondansetron, Dexamethasone, Midazolam and Treatment may vary due to age or medical condition  Airway Management Planned: Oral ETT  Additional Equipment:   Intra-op Plan:   Post-operative Plan: Extubation in OR  Informed Consent: I have reviewed the patients History and Physical, chart, labs and discussed the procedure including the risks, benefits and alternatives for the proposed anesthesia with the patient or authorized representative who has indicated his/her understanding and acceptance.     Dental Advisory Given  Plan Discussed with: CRNA and Surgeon  Anesthesia Plan Comments:         Anesthesia Quick Evaluation

## 2021-11-16 NOTE — Anesthesia Procedure Notes (Signed)
Procedure Name: Intubation Date/Time: 11/16/2021 2:40 PM  Performed by: Lowry Bowl, CRNAPre-anesthesia Checklist: Patient identified, Emergency Drugs available, Suction available and Patient being monitored Patient Re-evaluated:Patient Re-evaluated prior to induction Oxygen Delivery Method: Circle system utilized Preoxygenation: Pre-oxygenation with 100% oxygen Induction Type: IV induction and Cricoid Pressure applied Ventilation: Mask ventilation without difficulty Laryngoscope Size: 3 and McGraph Grade View: Grade I Tube type: Oral Tube size: 7.0 mm Number of attempts: 1 Airway Equipment and Method: Stylet and Video-laryngoscopy Placement Confirmation: ETT inserted through vocal cords under direct vision, positive ETCO2 and breath sounds checked- equal and bilateral Secured at: 21 cm Tube secured with: Tape Dental Injury: Teeth and Oropharynx as per pre-operative assessment

## 2021-11-16 NOTE — Transfer of Care (Signed)
Immediate Anesthesia Transfer of Care Note  Patient: Theresa Reid  Procedure(s) Performed: C1-2 POSTERIOR FUSION WITH ORIF (OPEN REDUCTION INTERNAL FIXATION) C2 FRACTURE (GLOBUS [place]) APPLICATION OF INTRAOPERATIVE CT SCAN  Patient Location: PACU  Anesthesia Type:General  Level of Consciousness: awake, drowsy and patient cooperative  Airway & Oxygen Therapy: Patient Spontanous Breathing and Patient connected to face mask oxygen  Post-op Assessment: Report given to RN and Post -op Vital signs reviewed and stable  Post vital signs: Reviewed and stable  Last Vitals:  Vitals Value Taken Time  BP 119/72 11/16/21 1700  Temp    Pulse 80 11/16/21 1706  Resp 14 11/16/21 1706  SpO2 100 % 11/16/21 1706  Vitals shown include unvalidated device data.  Last Pain:  Vitals:   11/16/21 1321  TempSrc: Oral         Complications: No notable events documented.

## 2021-11-17 ENCOUNTER — Encounter: Payer: Self-pay | Admitting: Neurosurgery

## 2021-11-17 ENCOUNTER — Ambulatory Visit: Payer: Medicare Other | Admitting: Neurosurgery

## 2021-11-17 MED ORDER — METHOCARBAMOL 500 MG PO TABS: 500.0000 mg | ORAL_TABLET | Freq: Three times a day (TID) | ORAL | 0 refills | Status: DC | PRN

## 2021-11-17 MED ORDER — OXYCODONE HCL 5 MG PO TABS: 5.0000 mg | ORAL_TABLET | ORAL | 0 refills | Status: AC | PRN

## 2021-11-17 NOTE — Discharge Instructions (Signed)
NEUROSURGERY DISCHARGE INSTRUCTIONS  Admission diagnosis: S/P cervical spinal fusion [Z98.1]  Operative procedure: C1-2 fusion  What to do after you leave the hospital:  Recommended diet: regular diet. Increase protein intake to promote wound healing.  Recommended activity: no lifting, driving, or strenuous exercise for 4 weeks . You should walk multiple times per day  Special Instructions  No straining, no heavy lifting > 10lbs x 4 weeks.  Keep incision area clean and dry. May shower in 2 days. No baths or pools for 6 weeks.  Please remove dressing tomorrow, no need to apply a bandage afterwards  You have sutures or staples that will be removed in clinic.   Please take pain medications as directed. Take a stool softener if on pain medications   Please Report any of the following: Nausea or Vomiting, Temperature is greater than 101.69F (38.1C) degrees, Dizziness, Abdominal Pain, Difficulty Breathing or Shortness of Breath, Inability to Eat, drink Fluids, or Take medications, Bleeding, swelling, or drainage from surgical incision sites, New numbness or weakness, and Bowel or bladder dysfunction to the neurosurgeon on call at [phone]  Additional Follow up appointments Please follow up with Cooper Render PA-C in [facility] [place] clinic as scheduled in 2-3 weeks   Please see below for scheduled appointments:  Future Appointments  Date Time Provider [place]  12/01/2021  2:00 PM Loleta Dicker, PA [facility] None  12/16/2021  9:00 AM Noreene Filbert, MD [facility] None  12/20/2021 12:45 PM [facility]-PORT FLUSH [facility] None  12/20/2021  1:00 PM Sindy Guadeloupe, MD [facility] None  12/20/2021  1:30 PM [facility]- MO INFUSION CHAIR 5 [facility] None  12/29/2021  2:30 PM Meade Maw, MD [facility] None  02/09/2022  2:30 PM Meade Maw, MD AS[facility] None  05/05/2022  1:45 PM Leone Haven, MD [facility]  10/02/2022  1:00 PM Benjamine Mola, Resa Miner, MD [facility] None

## 2021-11-17 NOTE — Plan of Care (Signed)
Patient discharged per MD orders at this time.All discharge instructions, education and medications reviewed with the patient.Pt expressed understanding and will comply with dc instructions.follow up appointments was also communicated to the patient.no verbal c/o or any ssx of distress.Pt was discharged home with self-care per order.Pt was transported home by daughter in a privately owned vehicle.

## 2021-11-17 NOTE — Discharge Summary (Signed)
Physician Discharge Summary  Patient ID: Theresa Reid MRN: [id number] DOB/AGE: 09-06-1940 81 y.o.  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  Discharge Diagnoses:  Principal Problem:   S/P cervical spinal fusion   Discharged Condition: good  Hospital Course:  Theresa Reid is a 81 y.o s/p C1-2 fusion for odontoid fracture.  Her intraoperative course was uncomplicated and she was admitted for drain output management, pain control, and therapy evaluation.  Her drain output reduced to acceptable level and was removed on postop day 1.  She was seen and evaluated by therapy and deemed appropriate for discharge home.  She was discharged home on postop day 1 with medications for pain.  Consults: None  Significant Diagnostic Studies: none   Treatments: surgery: as above.  Please see separately dictated operative report for further details  Discharge Exam: Blood pressure (!) 111/44, pulse 73, temperature 97.7 F (36.5 C), resp. rate 16, height '5\' 4"'$  (1.626 m), weight 87.1 kg, SpO2 97 %. AA Ox3 sitting up in bed eating breakfast CNI   Strength:5/5 throughout  Incision covered with post-op bandage   Disposition: Discharge disposition: 01-Home or Self Care       Discharge Instructions     Incentive spirometry RT   Complete by: As directed    Remove dressing in 24 hours   Complete by: As directed       Allergies as of 11/17/2021   No Known Allergies      Medication List     TAKE these medications    acetaminophen 325 MG tablet Commonly known as: TYLENOL Take 2 tablets (650 mg total) by mouth every 6 (six) hours as needed for mild pain (or Fever >/= 101).   amLODipine 5 MG tablet Commonly known as: NORVASC TAKE 1 TABLET BY MOUTH  DAILY What changed: when to take this    anastrozole 1 MG tablet Commonly known as: ARIMIDEX TAKE 1 TABLET BY MOUTH  DAILY DO NOT START UNTIL  COMPLETED RADIATION IN JAN  2021 What changed: See the new instructions.   atorvastatin 80 MG tablet Commonly known as: LIPITOR TAKE 1 TABLET BY MOUTH  DAILY What changed: when to take this   CALCIUM-VITAMIN D PO Take 1 tablet by mouth daily.   gabapentin 300 MG capsule Commonly known as: Neurontin Take 1 capsule (300 mg total) by mouth 3 (three) times daily for 20 days.   ketoconazole 2 % shampoo Commonly known as: NIZORAL Apply 1 application. topically 2 (two) times a week. What changed: when to take this   methocarbamol 500 MG tablet Commonly known as: ROBAXIN Take 1 tablet (500 mg total) by mouth every 8 (eight) hours as needed for muscle spasms.   Multiple Vitamins tablet Take 1 tablet by mouth daily.   omeprazole 20 MG capsule Commonly known as: PRILOSEC TAKE 1 CAPSULE BY MOUTH DAILY What changed:  how to take this when to take this   ondansetron 4 MG tablet Commonly known as: ZOFRAN Take 1 tablet (4 mg total) by mouth every 6 (six) hours as needed for nausea.   oxybutynin 5 MG tablet Commonly known as: DITROPAN TAKE 1 TABLET BY MOUTH  TWICE DAILY   oxyCODONE 5 MG immediate release tablet Commonly known as: Oxy IR/ROXICODONE Take 1 tablet (5 mg total) by mouth every 4 (four) hours as needed for up to 5 days for moderate pain or  severe pain ((score 4 to 6)). What changed:  when to take this reasons to take this   predniSONE 5 MG tablet Commonly known as: DELTASONE prednisone 5 mg tablet  TAKE 1 TO 2 TABLETS BY MOUTH EVERY DAY AS NEEDED FOR ARTHRITIS FLARE   senna-docusate 8.6-50 MG tablet Commonly known as: Senokot-S Take 1 tablet by mouth at bedtime as needed for mild constipation.        Follow-up Information     Loleta Dicker, PA Follow up in 2 week(s).   Specialty: Neurosurgery Why: for incision check and staple removal Contact  information: [address] [place] [address] [phone]                 Signed: Loleta Dicker 11/17/2021, 2:26 PM

## 2021-11-17 NOTE — Evaluation (Signed)
Occupational Therapy Evaluation Patient Details Name: Theresa Reid MRN: [id number] DOB: 06-Dec-1940 Today's Date: 11/17/2021   History of Present Illness 81yo female lost her balance and fell down 6 stairs on 11/04/21 resulting in C1 posterior arch fractures with unstable closed C2 type 2 odontoid fracture. Now s/p C1-2 posterior cervical fusion with ORIF of C2 fracture on 11/16/21. PMHx includes arthritis, breast cancer, diverticulitis, GERD, HLD, HTN, lymphedema RUE.   Clinical Impression   Pt seen for OT evaluation this date, POD#1 from the above surgery. Prior to recent fall, pt was independent with ADL and most IADL, dtr drives, and she uses a SPC outside the home for balance. No other falls in past 4mo Since fall she has been wearing a [place] J collar off and on but finds it very uncomfortable. Pt lives alone but plans to stay at her daughter's home for a period of time after discharge to assist with recovery. 2 steps and R rail to enter daughter's home. Pt required CGA-MIN A for bed mobility during log roll instruction to improve technique/safety. Pt/dtr instructed in home/routines modifications, AE/DME, cervical precautions and how to maintain, and falls prevention strategies. Pt requesting soft collar for comfort. MD notified. Pt/dtr verbalized understanding. Handout provided to support recall and carry over of learned precautions/techniques for bed mobility, functional transfers, and self care skills. Pt will benefit from additional skilled OT while hospitalized to maximize safety/indep with ADL/mobility prior to discharge.    Recommendations for follow up therapy are one component of a multi-disciplinary discharge planning process, led by the attending physician.  Recommendations may be updated based on patient status, additional functional criteria and insurance authorization.   Follow Up Recommendations  Follow physician's recommendations for discharge plan and follow up therapies     Assistance Recommended at Discharge Frequent or constant Supervision/Assistance  Patient can return home with the following A little help with walking and/or transfers;A little help with bathing/dressing/bathroom;Help with stairs or ramp for entrance;Assist for transportation;Assistance with cooking/housework    Functional Status Assessment  Patient has had a recent decline in their functional status and demonstrates the ability to make significant improvements in function in a reasonable and predictable amount of time.  Equipment Recommendations  None recommended by OT (Pt has all necessary equipment)    Recommendations for Other Services       Precautions / Restrictions Precautions Precautions: Cervical Precaution Booklet Issued: Yes (comment) Required Braces or Orthoses: Cervical Brace Cervical Brace: Soft collar;For comfort Restrictions Weight Bearing Restrictions: No      Mobility Bed Mobility Overal bed mobility: Needs Assistance Bed Mobility: Rolling, Sidelying to Sit Rolling: Supervision Sidelying to sit: Min assist       General bed mobility comments: VC for sequencing of log roll    Transfers Overall transfer level: Needs assistance Equipment used: Rolling walker (2 wheels) Transfers: Sit to/from Stand Sit to Stand: Min guard                  Balance Overall balance assessment: Needs assistance Sitting-balance support: Feet supported Sitting balance-Leahy Scale: Good     Standing balance support: No upper extremity supported, During functional activity Standing balance-Leahy Scale: Fair Standing balance comment: able to let go of RW to manage LB clothing items over hips without LOB                           ADL either performed or assessed with clinical judgement   ADL Overall  ADL's : Needs assistance/impaired                     Lower Body Dressing: Sitting/lateral leans;Sit to/from stand;Supervision/safety;Min  guard Lower Body Dressing Details (indicate cue type and reason): able to use figure 4 technique to independently manage socks, SBA in standing to manage clothing over hips Toilet Transfer: Min guard;Rolling walker (2 wheels)           Functional mobility during ADLs: Supervision/safety;Min guard;Rolling walker (2 wheels)       Vision         Perception     Praxis      Pertinent Vitals/Pain Pain Assessment Pain Assessment: No/denies pain     Hand Dominance Right   Extremity/Trunk Assessment Upper Extremity Assessment Upper Extremity Assessment: Generalized weakness   Lower Extremity Assessment Lower Extremity Assessment: Generalized weakness   Cervical / Trunk Assessment Cervical / Trunk Assessment: Neck Surgery   Communication Communication Communication: No difficulties   Cognition Arousal/Alertness: Awake/alert Behavior During Therapy: WFL for tasks assessed/performed Overall Cognitive Status: Within Functional Limits for tasks assessed                                       General Comments       Exercises Other Exercises Other Exercises: Pt/dtr instructed in home/routines modifications, AE/DME, cervical precautions and how to maintain, and falls prevention strategies. Pt requesting soft collar for comfort. MD notified.   Shoulder Instructions      Home Living Family/patient expects to be discharged to:: Private residence Living Arrangements: Alone Available Help at Discharge: Family;Available 24 hours/day Type of Home: House Home Access: Stairs to enter [employer] of Steps: 2 Entrance Stairs-Rails: Right Home Layout: One level     Bathroom Shower/Tub: Tub/shower unit         Home Equipment: Conservation officer, nature (2 wheels);Cane - single point;BSC/3in1;Adaptive equipment;Hand held shower head;Tub bench Adaptive Equipment: Reacher Additional Comments: Pt normally lives alone but will discharge to daughter's home initially  (home set up above for daughter's home). Pt's single story home has 2 steps from back entrance and tub/shower      Prior Functioning/Environment Prior Level of Function : Independent/Modified Independent             Mobility Comments: Use of SPC in community. ADLs Comments: Ind in self care and IADLs. She does not drive and daughter takes her to appointments, grocery store, etc.        OT Problem List: Decreased strength;Decreased range of motion;Decreased activity tolerance;Impaired balance (sitting and/or standing);Decreased safety awareness;Decreased knowledge of use of DME or AE;Decreased knowledge of precautions      OT Treatment/Interventions: Self-care/ADL training;Therapeutic exercise;Therapeutic activities;DME and/or AE instruction;Patient/family education;Balance training;Manual therapy    OT Goals(Current goals can be found in the care plan section) Acute Rehab OT Goals Patient Stated Goal: go home wiht dtr OT Goal Formulation: With patient/family Time For Goal Achievement: 12/01/21 Potential to Achieve Goals: Good  OT Frequency: Min 2X/week    Co-evaluation              AM-PAC OT "6 Clicks" Daily Activity     Outcome Measure Help from another person eating meals?: None Help from another person taking care of personal grooming?: None Help from another person toileting, which includes using toliet, bedpan, or urinal?: A Little Help from another person bathing (including washing, rinsing, drying)?: A Little  Help from another person to put on and taking off regular upper body clothing?: None Help from another person to put on and taking off regular lower body clothing?: None 6 Click Score: 22   End of Session Equipment Utilized During Treatment: Rolling walker (2 wheels)  Activity Tolerance: Patient tolerated treatment well Patient left: in chair;with call bell/phone within reach;with family/visitor present  OT Visit Diagnosis: Unsteadiness on feet  (R26.81);Repeated falls (R29.6);Muscle weakness (generalized) (M62.81)                Time: 0300-9233 OT Time Calculation (min): 33 min Charges:  OT General Charges $OT Visit: 1 Visit OT Evaluation $OT Eval Moderate Complexity: 1 Mod OT Treatments $Self Care/Home Management : 23-37 mins  Ardeth Perfect., MPH, MS, OTR/L ascom [phone] 11/17/21, 10:39 AM

## 2021-11-17 NOTE — Evaluation (Signed)
Physical Therapy Evaluation Patient Details Name: Theresa Reid MRN: [id number] DOB: August 15, 1940 Today's Date: 11/17/2021  History of Present Illness  81yo female lost her balance and fell down 6 stairs on 11/04/21 resulting in C1 posterior arch fractures with unstable closed C2 type 2 odontoid fracture. Now s/p C1-2 posterior cervical fusion with ORIF of C2 fracture on 11/16/21. PMHx includes arthritis, breast cancer, diverticulitis, GERD, HLD, HTN, lymphedema RUE.  Clinical Impression  Pt admitted with above diagnosis. Pt currently with functional limitations due to the deficits listed below (see "PT Problem List"). Upon entry, pt in bed, awake and agreeable to participate. The pt is alert, pleasant, interactive, and able to provide info regarding prior level of function, both in tolerance and independence. DTR at bedside. Pt performing transfers and AMB at her PLOF, no concerns for acute motor impairment. Patient's performance this date reveals decreased ability, independence, and tolerance in performing all basic mobility required for performance of activities of daily living. Pt requires additional DME, close physical assistance, and cues for safe participate in mobility. Pt will benefit from skilled PT intervention to increase independence and safety with basic mobility in preparation for discharge to the venue listed below.     No data found.        Recommendations for follow up therapy are one component of a multi-disciplinary discharge planning process, led by the attending physician.  Recommendations may be updated based on patient status, additional functional criteria and insurance authorization.  Follow Up Recommendations Follow physician's recommendations for discharge plan and follow up therapies      Assistance Recommended at Discharge Set up Supervision/Assistance  Patient can return home with the following  A little help with walking and/or transfers;A little help with  bathing/dressing/bathroom    Equipment Recommendations None recommended by PT  Recommendations for Other Services       Functional Status Assessment Patient has had a recent decline in their functional status and demonstrates the ability to make significant improvements in function in a reasonable and predictable amount of time.     Precautions / Restrictions Precautions Precautions: Fall;None Precaution Booklet Issued: Yes (comment) Required Braces or Orthoses: Cervical Brace Cervical Brace: Soft collar;For comfort Restrictions Weight Bearing Restrictions: No      Mobility  Bed Mobility               General bed mobility comments: in chair    Transfers Overall transfer level: Modified independent Equipment used: Rolling walker (2 wheels) Transfers: Sit to/from Stand Sit to Stand: Supervision                Ambulation/Gait Ambulation/Gait assistance: Supervision Gait Distance (Feet): 270 Feet Assistive device: Rolling walker (2 wheels) Gait Pattern/deviations: WFL(Within Functional Limits) Gait velocity: ISQ     General Gait Details: endorses baseline function, AMB doorway back to chair without device  Stairs            Wheelchair Mobility    Modified Rankin (Stroke Patients Only)       Balance                                             Pertinent Vitals/Pain Pain Assessment Pain Assessment: 0-10 Pain Score: 0-No pain Pain Location: posterior neck Pain Descriptors / Indicators: Discomfort Pain Intervention(s): Limited activity within patient's tolerance, Monitored during session, Premedicated before session    Home  Living Family/patient expects to be discharged to:: Private residence Living Arrangements: Alone Available Help at Discharge: Family;Available 24 hours/day Type of Home: House Home Access: Stairs to enter Entrance Stairs-Rails: Right Entrance Stairs-Number of Steps: 2   Home Layout: One level Home  Equipment: Conservation officer, nature (2 wheels);Cane - single point;BSC/3in1;Adaptive equipment;Hand held shower head;Tub bench Additional Comments: Pt normally lives alone but will discharge to daughter's home initially (home set up above for daughter's home). Pt's single story home has 2 steps from back entrance and tub/shower    Prior Function Prior Level of Function : Independent/Modified Independent             Mobility Comments: Use of SPC in community. ADLs Comments: Ind in self care and IADLs. She does not drive and daughter takes her to appointments, grocery store, etc.     Hand Dominance   Dominant Hand: Right    Extremity/Trunk Assessment   Upper Extremity Assessment Upper Extremity Assessment: Generalized weakness    Lower Extremity Assessment Lower Extremity Assessment: Generalized weakness    Cervical / Trunk Assessment Cervical / Trunk Assessment: Neck Surgery  Communication   Communication: No difficulties  Cognition Arousal/Alertness: Awake/alert Behavior During Therapy: WFL for tasks assessed/performed Overall Cognitive Status: Within Functional Limits for tasks assessed                                          General Comments      Exercises     Assessment/Plan    PT Assessment Patient needs continued PT services  PT Problem List Decreased strength;Decreased activity tolerance;Decreased balance;Decreased mobility       PT Treatment Interventions DME instruction;Gait training;Stair training;Functional mobility training;Therapeutic activities;Therapeutic exercise;Balance training;Neuromuscular re-education    PT Goals (Current goals can be found in the Care Plan section)  Acute Rehab PT Goals Patient Stated Goal: to go home. PT Goal Formulation: With patient Time For Goal Achievement: 12/01/21 Potential to Achieve Goals: Good    Frequency 7X/week     Co-evaluation               AM-PAC PT "6 Clicks" Mobility  Outcome  Measure Help needed turning from your back to your side while in a flat bed without using bedrails?: None Help needed moving from lying on your back to sitting on the side of a flat bed without using bedrails?: None Help needed moving to and from a bed to a chair (including a wheelchair)?: None Help needed standing up from a chair using your arms (e.g., wheelchair or bedside chair)?: None Help needed to walk in hospital room?: A Little Help needed climbing 3-5 steps with a railing? : A Little 6 Click Score: 22    End of Session Equipment Utilized During Treatment: Gait belt Activity Tolerance: Patient tolerated treatment well;No increased pain Patient left: in chair;with call bell/phone within reach;with family/visitor present Nurse Communication: Mobility status PT Visit Diagnosis: Unsteadiness on feet (R26.81);Other abnormalities of gait and mobility (R26.89);Muscle weakness (generalized) (M62.81);Difficulty in walking, not elsewhere classified (R26.2)    Time: 1034-1050 PT Time Calculation (min) (ACUTE ONLY): 16 min   Charges:   PT Evaluation $PT Eval Moderate Complexity: 1 Mod         12:21 PM, 11/17/21 Etta Grandchild, PT, DPT Physical Therapist - [facility]  [phone] ([place])    [place] C 11/17/2021, 12:19 PM

## 2021-11-17 NOTE — Progress Notes (Signed)
    Attending Progress Note  History: TZIPPORAH NAGORSKI is s/p C1-2 fusion   POD1: Expected neck soreness but pt reports good pain control.  Physical Exam: Vitals:   11/17/21 0447 11/17/21 0752  BP: (!) 112/51 (!) 128/51  Pulse: 79 84  Resp: 20 16  Temp: 97.8 F (36.6 C) 98.3 F (36.8 C)  SpO2: 93% 95%    AA Ox3 sitting up in bed eating breakfast CNI  Strength:5/5 throughout  Incision covered with post-op bandage HV 42 since surgery  Data:  No results for input(s): "NA", "K", "CL", "CO2", "BUN", "CREATININE", "LABGLOM", "GLUCOSE", "CALCIUM" in the last 168 hours. No results for input(s): "AST", "ALT", "ALKPHOS" in the last 168 hours.  Invalid input(s): "TBILI"   No results for input(s): "WBC", "HGB", "HCT", "PLT" in the last 168 hours. No results for input(s): "APTT", "INR" in the last 168 hours.       Other tests/results: none   Assessment/Plan:  KENNEDI LIZARDO is a 81 y.o with an odontoid fracture s/p C1-2 fusion  - mobilize - pain control - DVT prophylaxis - PTOT - removed HV this morning - dispo planning underway  Cooper Render PA-C Department of Neurosurgery

## 2021-11-17 NOTE — Anesthesia Postprocedure Evaluation (Signed)
Anesthesia Post Note  Patient: Theresa Reid  Procedure(s) Performed: C1-2 POSTERIOR FUSION WITH ORIF (OPEN REDUCTION INTERNAL FIXATION) C2 FRACTURE (GLOBUS [place]) APPLICATION OF INTRAOPERATIVE CT SCAN  Patient location during evaluation: PACU Anesthesia Type: General Level of consciousness: awake and alert Pain management: pain level controlled Vital Signs Assessment: post-procedure vital signs reviewed and stable Respiratory status: spontaneous breathing, nonlabored ventilation and respiratory function stable Cardiovascular status: blood pressure returned to baseline and stable Postop Assessment: no apparent nausea or vomiting Anesthetic complications: no   No notable events documented.   Last Vitals:  Vitals:   11/16/21 2025 11/17/21 0447  BP: 113/69 (!) 112/51  Pulse: 80 79  Resp: 20 20  Temp: 36.7 C 36.6 C  SpO2: 94% 93%    Last Pain:  Vitals:   11/16/21 2000  TempSrc:   PainSc: 0-No pain                 [place] Ouch

## 2021-11-18 ENCOUNTER — Ambulatory Visit: Payer: Self-pay | Admitting: *Deleted

## 2021-11-18 NOTE — Patient Outreach (Signed)
  Care Coordination [facility] Note Transition Care Management Unsuccessful Follow-up Telephone Call  Date of discharge and from where:  11/17/21 from [place]  Attempts:  1st Attempt  Reason for unsuccessful TCM follow-up call:  Left voice message  Hubert Azure RN, MSN RN Care Management Coordinator  [place] [phone] [email or web address]

## 2021-11-21 ENCOUNTER — Ambulatory Visit: Payer: Medicare Other | Admitting: Dermatology

## 2021-11-21 ENCOUNTER — Other Ambulatory Visit: Payer: Self-pay | Admitting: *Deleted

## 2021-11-21 NOTE — Patient Outreach (Signed)
  Care Coordination [facility] Note Transition Care Management Unsuccessful Follow-up Telephone Call  Date of discharge and from where:  11/17/21 [facility]  Attempts:  2nd Attempt  Reason for unsuccessful TCM follow-up call:  Left voice message.  Emelia Loron RN, BSN [place] [phone] Kolleen [email or web address]

## 2021-11-22 ENCOUNTER — Ambulatory Visit: Payer: Self-pay

## 2021-11-22 NOTE — Patient Outreach (Signed)
  Care Coordination TOC Note Transition Care Management Unsuccessful Follow-up Telephone Call  Date of discharge and from where:  [facility] 11/16/21-11/17/21  Attempts:  3rd Attempt  Reason for unsuccessful TCM follow-up call:  Unable to leave message

## 2021-12-01 ENCOUNTER — Ambulatory Visit (INDEPENDENT_AMBULATORY_CARE_PROVIDER_SITE_OTHER): Payer: Medicare Other | Admitting: Neurosurgery

## 2021-12-01 ENCOUNTER — Encounter: Payer: Self-pay | Admitting: Neurosurgery

## 2021-12-01 VITALS — BP 140/72 | Temp 98.5°F | Ht 64.0 in | Wt 187.4 lb

## 2021-12-01 DIAGNOSIS — M532X2 Spinal instabilities, cervical region: Secondary | ICD-10-CM

## 2021-12-01 DIAGNOSIS — Z981 Arthrodesis status: Secondary | ICD-10-CM

## 2021-12-01 DIAGNOSIS — Z09 Encounter for follow-up examination after completed treatment for conditions other than malignant neoplasm: Secondary | ICD-10-CM

## 2021-12-01 DIAGNOSIS — S12111K Posterior displaced Type II dens fracture, subsequent encounter for fracture with nonunion: Secondary | ICD-10-CM

## 2021-12-01 NOTE — Progress Notes (Signed)
   REFERRING PHYSICIAN:  Alease Medina [address] [place],  [place] [address]  DOS: 11/16/21 C1-2 PSF for odontoid.   HISTORY OF PRESENT ILLNESS: Theresa Reid is about 2 weeks status post C1-2 PSF. Overall, she is doing very well post-op.  She reports improved neck pain since her surgery.  She denies any incisional complaints.  She has since returned home and is doing the vast majority of her ADLs.  PHYSICAL EXAMINATION:  NEUROLOGICAL:  General: In no acute distress.   Awake, alert, oriented to person, place, and time.  Pupils equal round and reactive to light.  Facial tone is symmetric.  Tongue protrusion is midline.  There is no pronator drift.  Strength: Side Biceps Triceps Deltoid Interossei Grip Wrist Ext. Wrist Flex.  R '5 5 5 5 5 5 5  '$ L '5 5 5 5 5 5 5   '$ Incision c/d/I and healing well  Imaging:  No interval imaging to review.  Assessment / Plan: Theresa Reid is doing well after C1-2 fusion. We discussed activity escalation and I have advised the patient to lift up to 10 pounds until 6 weeks after surgery, then increase up to 25 pounds until 12 weeks after surgery.  After 12 weeks post-op, the patient advised to increase activity as tolerated. she will return to clinic in approximately 4 weeks to see Dr. Izora Ribas with cervical xrays prior.  She was encouraged to call the office in the meantime with any questions or concerns.  She expressed understanding was in agreement with this plan.  Cooper Render PA-C Dept of Neurosurgery

## 2021-12-06 ENCOUNTER — Ambulatory Visit: Payer: Medicare Other | Admitting: Occupational Therapy

## 2021-12-16 ENCOUNTER — Other Ambulatory Visit: Payer: Medicare Other

## 2021-12-16 ENCOUNTER — Ambulatory Visit: Payer: Medicare Other | Admitting: Radiation Oncology

## 2021-12-20 ENCOUNTER — Inpatient Hospital Stay: Payer: Medicare Other

## 2021-12-20 ENCOUNTER — Inpatient Hospital Stay: Payer: Medicare Other | Attending: Oncology

## 2021-12-20 ENCOUNTER — Inpatient Hospital Stay (HOSPITAL_BASED_OUTPATIENT_CLINIC_OR_DEPARTMENT_OTHER): Payer: Medicare Other | Admitting: Oncology

## 2021-12-20 VITALS — BP 137/70 | HR 96 | Temp 98.4°F | Ht 65.0 in | Wt 187.5 lb

## 2021-12-20 DIAGNOSIS — Z170 Estrogen receptor positive status [ER+]: Secondary | ICD-10-CM | POA: Insufficient documentation

## 2021-12-20 DIAGNOSIS — Z7983 Long term (current) use of bisphosphonates: Secondary | ICD-10-CM

## 2021-12-20 DIAGNOSIS — Z08 Encounter for follow-up examination after completed treatment for malignant neoplasm: Secondary | ICD-10-CM

## 2021-12-20 DIAGNOSIS — C50911 Malignant neoplasm of unspecified site of right female breast: Secondary | ICD-10-CM | POA: Diagnosis not present

## 2021-12-20 DIAGNOSIS — Z803 Family history of malignant neoplasm of breast: Secondary | ICD-10-CM | POA: Diagnosis not present

## 2021-12-20 DIAGNOSIS — Z853 Personal history of malignant neoplasm of breast: Secondary | ICD-10-CM | POA: Diagnosis not present

## 2021-12-20 DIAGNOSIS — Z79811 Long term (current) use of aromatase inhibitors: Secondary | ICD-10-CM | POA: Insufficient documentation

## 2021-12-20 DIAGNOSIS — Z5181 Encounter for therapeutic drug level monitoring: Secondary | ICD-10-CM | POA: Diagnosis not present

## 2021-12-20 DIAGNOSIS — I10 Essential (primary) hypertension: Secondary | ICD-10-CM | POA: Diagnosis not present

## 2021-12-20 DIAGNOSIS — I890 Lymphedema, not elsewhere classified: Secondary | ICD-10-CM | POA: Diagnosis not present

## 2021-12-20 LAB — CBC WITH DIFFERENTIAL/PLATELET
Abs Immature Granulocytes: 0.03 10*3/uL (ref 0.00–0.07)
Basophils Absolute: 0 10*3/uL (ref 0.0–0.1)
Basophils Relative: 0 %
Eosinophils Absolute: 0.2 10*3/uL (ref 0.0–0.5)
Eosinophils Relative: 2 %
HCT: 40.8 % (ref 36.0–46.0)
Hemoglobin: 13.5 g/dL (ref 12.0–15.0)
Immature Granulocytes: 0 %
Lymphocytes Relative: 30 %
Lymphs Abs: 2.8 10*3/uL (ref 0.7–4.0)
MCH: 30.1 pg (ref 26.0–34.0)
MCHC: 33.1 g/dL (ref 30.0–36.0)
MCV: 90.9 fL (ref 80.0–100.0)
Monocytes Absolute: 0.8 10*3/uL (ref 0.1–1.0)
Monocytes Relative: 9 %
Neutro Abs: 5.5 10*3/uL (ref 1.7–7.7)
Neutrophils Relative %: 59 %
Platelets: 219 10*3/uL (ref 150–400)
RBC: 4.49 MIL/uL (ref 3.87–5.11)
RDW: 15 % (ref 11.5–15.5)
WBC: 9.4 10*3/uL (ref 4.0–10.5)
nRBC: 0 % (ref 0.0–0.2)

## 2021-12-20 LAB — COMPREHENSIVE METABOLIC PANEL
ALT: 15 U/L (ref 0–44)
AST: 21 U/L (ref 15–41)
Albumin: 3.7 g/dL (ref 3.5–5.0)
Alkaline Phosphatase: 94 U/L (ref 38–126)
Anion gap: 6 (ref 5–15)
BUN: 18 mg/dL (ref 8–23)
CO2: 25 mmol/L (ref 22–32)
Calcium: 8.5 mg/dL — ABNORMAL LOW (ref 8.9–10.3)
Chloride: 106 mmol/L (ref 98–111)
Creatinine, Ser: 0.58 mg/dL (ref 0.44–1.00)
GFR, Estimated: >60 mL/min (ref >60)
Glucose, Bld: 104 mg/dL — ABNORMAL HIGH (ref 70–99)
Potassium: 3.4 mmol/L — ABNORMAL LOW (ref 3.5–5.1)
Sodium: 137 mmol/L (ref 135–145)
Total Bilirubin: 0.3 mg/dL (ref 0.3–1.2)
Total Protein: 6.8 g/dL (ref 6.5–8.1)

## 2021-12-20 MED ORDER — HEPARIN SOD (PORK) LOCK FLUSH 100 UNIT/ML IV SOLN
INTRAVENOUS | Status: AC
  Administered 2021-12-20: 500 [IU]
  Filled 2021-12-20: qty 5

## 2021-12-20 MED ORDER — SODIUM CHLORIDE 0.9 % IV SOLN
INTRAVENOUS | Status: DC
  Filled 2021-12-20: qty 250

## 2021-12-20 MED ORDER — ZOLEDRONIC ACID 4 MG/100ML IV SOLN
4.0000 mg | Freq: Once | INTRAVENOUS | Status: AC
  Administered 2021-12-20: 4 mg via INTRAVENOUS
  Filled 2021-12-20: qty 100

## 2021-12-20 MED ORDER — HEPARIN SOD (PORK) LOCK FLUSH 100 UNIT/ML IV SOLN
500.0000 [IU] | Freq: Once | INTRAVENOUS | Status: AC | PRN
  Filled 2021-12-20: qty 5

## 2021-12-20 NOTE — Progress Notes (Signed)
Ok to give Zometa today with a calcium level of 8.5 per Dr. Janese Banks.

## 2021-12-21 ENCOUNTER — Ambulatory Visit: Payer: Medicare Other | Admitting: Family Medicine

## 2021-12-22 ENCOUNTER — Encounter: Payer: Self-pay | Admitting: Oncology

## 2021-12-22 NOTE — Progress Notes (Signed)
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  Patient underwent lumpectomy and sentinel lymph node biopsy in February 2020.  Pathology showed invasive lobular carcinoma with positive medial margin which was reexcised with still positive.  Tumor was 24 mm, grade 2 ER PR positive and HER-2/neu negative.  One sentinel lymph node that was excised was positive for macro met of 8 mm.  No extranodal extension was present.  MammaPrint came back as high risk with an average 10-year risk of untreated disease is 29%.  Patient then underwent a bilateral MRI which showed additional suspicious non-mass enhancement along the inferior margin of the biopsy cavity extending 2 cm highly suspicious for continuous disease and another highly suspicious linear non-mass enhancement at the base of the nipple concerning for multifocal multicentric disease.   Patient underwent right mastectomy and axillary lymph node dissection on 09/20/2018.  There was no residual invasive mammary carcinoma within the breast.  However 15 out of 20 lymph nodes were involved with metastatic carcinoma measuring up to 6 mm and remaining 5 lymph nodes with isolated tumor cells.  Pathologic stage was [id number]   Despite having surgery in May 2020 which was a second surgery-patient has not been able to start adjuvant chemotherapy.  Her mastectomy was complicated by flap necrosis requiring debridement antibiotics and she had to be taken back to the OR for the same. Given her age and postmastectomy complications and delayed wound healing-weekly Taxol x12 cycles was chosen as the regimen for her adjuvant chemotherapy which she completed on 02/25/2019.   Patient started Arimidex in December 2020 and also received dental clearance to start Zometa.  She completed adjuvant radiation treatment    Interval history-tolerating Arimidex along with calcium and vitamin D well.  She recently had a fall and injured her neck requiring surgery for her cervical spine.  She does report neck stiffness and has not recovered her full range of motion.  Has chronic right upper extremity lymphedema.  She wears a compression sleeve  ECOG PS- 1 Pain scale- 0   Review of systems- Review of Systems  Constitutional:  Negative for chills, fever, malaise/fatigue and weight loss.  HENT:  Negative for congestion, ear discharge and nosebleeds.   Eyes:  Negative for blurred vision.  Respiratory:  Negative for cough, hemoptysis, sputum production, shortness of breath and wheezing.   Cardiovascular:  Negative for chest pain, palpitations, orthopnea and claudication.  Gastrointestinal:  Negative for abdominal pain, blood in stool, constipation, diarrhea, heartburn, melena, nausea and vomiting.  Genitourinary:  Negative  for dysuria, flank pain, frequency, hematuria and urgency.  Musculoskeletal:  Positive for neck  pain. Negative for back pain, joint pain and myalgias.  Skin:  Negative for rash.  Neurological:  Negative for dizziness, tingling, focal weakness, seizures, weakness and headaches.  Endo/Heme/Allergies:  Does not bruise/bleed easily.  Psychiatric/Behavioral:  Negative for depression and suicidal ideas. The patient does not have insomnia.       No Known Allergies   Past Medical History:  Diagnosis Date   Arthritis    Breast cancer ([place])    Diverticulitis    Family history of breast cancer    GERD (gastroesophageal reflux disease)    Hyperlipidemia    Hypertension    Lymphedema    right arm   Personal history of chemotherapy    Personal history of radiation therapy    Pre-diabetes      Past Surgical History:  Procedure Laterality Date   APPLICATION OF INTRAOPERATIVE CT SCAN N/A 11/16/2021   Procedure: APPLICATION OF INTRAOPERATIVE CT SCAN;  Surgeon: Meade Maw, MD;  Location: [facility] ORS;  Service: Neurosurgery;  Laterality: N/A;   BREAST BIOPSY Right 05/28/2018   [place] bx, INVASIVE MAMMARY CARCINOMA WITH LOBULAR FEATURES   BREAST LUMPECTOMY Right 06/12/2018   [place], lobular features   BREAST LUMPECTOMY WITH SENTINEL LYMPH NODE BIOPSY Right 06/12/2018   Procedure: RIGHT BREAST WIDE EXCISION WITH SENTINEL LYMPH NODE BX;  Surgeon: Robert Bellow, MD;  Location: [facility] ORS;  Service: General;  Laterality: Right;   [place] Right 10/30/2018   Procedure: IRRIGATION AND DEBRIDEMENT RIGHT CHEST WALL WOUND;  Surgeon: Robert Bellow, MD;  Location: [facility] ORS;  Service: General;  Laterality: Right;   MASTECTOMY Right    MASTECTOMY WITH AXILLARY LYMPH NODE DISSECTION Right 09/20/2018   Procedure: MASTECTOMY WITH AXILLARY LYMPH NODE DISSECTION RIGHT;  Surgeon: Robert Bellow, MD;  Location: [facility] ORS;  Service: General;  Laterality: Right;   ovaraian cyst removal Right    Cyst only (NOT OVARY)   PORTACATH  PLACEMENT Left 09/20/2018   Procedure: INSERTION PORT-A-CATH, LEFT;  Surgeon: Robert Bellow, MD;  Location: [facility] ORS;  Service: General;  Laterality: Left;   POSTERIOR CERVICAL FUSION/FORAMINOTOMY N/A 11/16/2021   Procedure: C1-2 POSTERIOR FUSION WITH ORIF (OPEN REDUCTION INTERNAL FIXATION) C2 FRACTURE ([place]);  Surgeon: Meade Maw, MD;  Location: [facility] ORS;  Service: Neurosurgery;  Laterality: N/A;   TOTAL KNEE ARTHROPLASTY Bilateral 05/05/2010    Social History   Socioeconomic History   Marital status: Single    Spouse name: Not on file   Number of children: Not on file   Years of education: Not on file   Highest education level: Not on file  Occupational History   Occupation: retired  Tobacco Use   Smoking status: Never   Smokeless tobacco: Never  Vaping Use   Vaping Use: Never used  Substance and Sexual Activity   Alcohol use: No    Alcohol/week: 0.0 standard drinks of alcohol   Drug use: No   Sexual activity: Never  Other Topics Concern   Not on file  Social History Narrative   Retired    Lives by herself    Pets: None   Caffeine- Coffee 2 cups daily, no tea/soda      Social Determinants of Health   Financial Resource Strain: Low Risk  (05/20/2021)   Overall Financial Resource Strain (CARDIA)    Difficulty of  Paying Living Expenses: Not hard at all  Food Insecurity: No Food Insecurity (05/20/2021)   Hunger Vital Sign    Worried About Running Out of Food in the Last Year: Never true    Ran Out of Food in the Last Year: Never true  Transportation Needs: No Transportation Needs (05/20/2021)   PRAPARE - Hydrologist (Medical): No    Lack of Transportation (Non-Medical): No  Physical Activity: Not on file  Stress: No Stress Concern Present (05/20/2021)   [place]    Feeling of Stress : Not at all  Social Connections: Unknown (05/20/2021)   Social  Connection and Isolation Panel [NHANES]    Frequency of Communication with Friends and Family: More than three times a week    Frequency of Social Gatherings with Friends and Family: More than three times a week    Attends Religious Services: Not on file    Active Member of [place] or Organizations: Not on file    Attends Archivist Meetings: Not on file    Marital Status: Not on file  Intimate Partner Violence: Not At Risk (05/20/2021)   Humiliation, Afraid, Rape, and Kick questionnaire    Fear of Current or Ex-Partner: No    Emotionally Abused: No    Physically Abused: No    Sexually Abused: No    Family History  Problem Relation Age of Onset   Alcohol abuse Mother        deceased 49   Arthritis Mother    Diabetes Father    Breast cancer Maternal Aunt        dx 77s; deceased 19s   Breast cancer Maternal Aunt        dx 19s; deceased 84s   Arthritis Maternal Grandmother      Current Outpatient Medications:    acetaminophen (TYLENOL) 325 MG tablet, Take 2 tablets (650 mg total) by mouth every 6 (six) hours as needed for mild pain (or Fever >/= 101)., Disp: , Rfl:    amLODipine (NORVASC) 5 MG tablet, TAKE 1 TABLET BY MOUTH  DAILY (Patient taking differently: Take 5 mg by mouth at bedtime.), Disp: 90 tablet, Rfl: 3   anastrozole (ARIMIDEX) 1 MG tablet, TAKE 1 TABLET BY MOUTH  DAILY DO NOT START UNTIL  COMPLETED RADIATION IN JAN  2021 (Patient taking differently: Take 1 mg by mouth every morning.), Disp: 90 tablet, Rfl: 3   atorvastatin (LIPITOR) 80 MG tablet, TAKE 1 TABLET BY MOUTH  DAILY (Patient taking differently: Take 80 mg by mouth at bedtime.), Disp: 90 tablet, Rfl: 3   CALCIUM-VITAMIN D PO, Take 1 tablet by mouth daily. , Disp: , Rfl:    Multiple Vitamins tablet, Take 1 tablet by mouth daily. , Disp: , Rfl:    omeprazole (PRILOSEC) 20 MG capsule, TAKE 1 CAPSULE BY MOUTH DAILY (Patient taking differently: 20 mg every morning.), Disp: 100 capsule, Rfl: 2   oxybutynin  (DITROPAN) 5 MG tablet, TAKE 1 TABLET BY MOUTH  TWICE DAILY (Patient taking differently: Take 5 mg by mouth 2 (two) times daily.), Disp: 180 tablet, Rfl: 3   predniSONE (DELTASONE) 5 MG tablet, prednisone 5 mg tablet  TAKE 1 TO 2 TABLETS BY MOUTH EVERY DAY AS NEEDED FOR ARTHRITIS FLARE, Disp: 90 tablet, Rfl: 0   gabapentin (NEURONTIN) 300 MG capsule, Take 1 capsule (300 mg total) by mouth 3 (three) times daily for 20 days., Disp: 60 capsule, Rfl: 0  ketoconazole (NIZORAL) 2 % shampoo, Apply 1 application. topically 2 (two) times a week. (Patient taking differently: Apply 1 application  topically once a week.), Disp: 120 mL, Rfl: 6   senna-docusate (SENOKOT-S) 8.6-50 MG tablet, Take 1 tablet by mouth at bedtime as needed for mild constipation. (Patient not taking: Reported on 12/20/2021), Disp: 20 tablet, Rfl: 0  Physical exam:  Vitals:   12/20/21 1331  BP: 137/70  Pulse: 96  Temp: 98.4 F (36.9 C)  TempSrc: Tympanic  SpO2: 95%  Weight: 187 lb 8 oz (85 kg)  Height: _0  (1.651 m)   Physical Exam Constitutional:      General: She is not in acute distress. Cardiovascular:     Rate and Rhythm: Normal rate and regular rhythm.     Heart sounds: Normal heart sounds.  Pulmonary:     Effort: Pulmonary effort is normal.     Breath sounds: Normal breath sounds.  Musculoskeletal:     Comments: Right upper extremity has compression wrap in place  Skin:    General: Skin is warm and dry.  Neurological:     Mental Status: She is alert and oriented to person, place, and time.   Breast exam: Performed in sitting and lying down position.Patient is s/p right mastectomy with a well-healed surgical scar and no evidence of chest wall recurrence.  No palpable bilateral axillary adenopathy.  No palpable masses in the left breast.     Latest Ref Rng & Units 12/20/2021    1:11 PM  CMP  Glucose 70 - 99 mg/dL 104   BUN 8 - 23 mg/dL 18   Creatinine 0.44 - 1.00 mg/dL 0.58   Sodium 135 - 145 mmol/L 137    Potassium 3.5 - 5.1 mmol/L 3.4   Chloride 98 - 111 mmol/L 106   CO2 22 - 32 mmol/L 25   Calcium 8.9 - 10.3 mg/dL 8.5   Total Protein 6.5 - 8.1 g/dL 6.8   Total Bilirubin 0.3 - 1.2 mg/dL 0.3   Alkaline Phos 38 - 126 U/L 94   AST 15 - 41 U/L 21   ALT 0 - 44 U/L 15       Latest Ref Rng & Units 12/20/2021    1:11 PM  CBC  WBC 4.0 - 10.5 K/uL 9.4   Hemoglobin 12.0 - 15.0 g/dL 13.5   Hematocrit 36.0 - 46.0 % 40.8   Platelets 150 - 400 K/uL 219      Assessment and plan- Patient is a 81 y.o. female with history of right breast cancer ER/PR positive HER2 negative stage III s/p mastectomy adjuvant chemotherapy and radiation therapy currently on Arimidex and Zometa here for routine follow-up  Calcium levels are 8.5 today and just at the cutoff to receive Zometa today.  Patient will continue Arimidex along with calcium and vitamin D.  I will see her back in 6 months with CMP and for her to receive next dose of Zometa.  She will need a bone density scan prior.  Patient still has a port in place which will be flushed every 3 months.  Patient started her Zometa in February 2021 and will come continue until February 2024.   Visit Diagnosis 1. Encounter for follow-up surveillance of breast cancer   2. Visit for monitoring Arimidex therapy   3. Encounter for monitoring zoledronate therapy      Dr. Randa Evens, MD, MPH [facility] [phone] 12/22/2021 9:35 AM

## 2021-12-28 ENCOUNTER — Other Ambulatory Visit: Payer: Self-pay

## 2021-12-28 DIAGNOSIS — Z981 Arthrodesis status: Secondary | ICD-10-CM

## 2021-12-29 ENCOUNTER — Ambulatory Visit (INDEPENDENT_AMBULATORY_CARE_PROVIDER_SITE_OTHER): Payer: Medicare Other | Admitting: Neurosurgery

## 2021-12-29 ENCOUNTER — Encounter: Payer: Self-pay | Admitting: Neurosurgery

## 2021-12-29 VITALS — BP 159/98 | HR 85 | Temp 98.0°F | Ht 64.0 in | Wt 181.2 lb

## 2021-12-29 DIAGNOSIS — S12111K Posterior displaced Type II dens fracture, subsequent encounter for fracture with nonunion: Secondary | ICD-10-CM

## 2021-12-29 DIAGNOSIS — M532X2 Spinal instabilities, cervical region: Secondary | ICD-10-CM

## 2021-12-29 DIAGNOSIS — Z09 Encounter for follow-up examination after completed treatment for conditions other than malignant neoplasm: Secondary | ICD-10-CM

## 2021-12-29 DIAGNOSIS — Z981 Arthrodesis status: Secondary | ICD-10-CM

## 2021-12-29 NOTE — Progress Notes (Signed)
   REFERRING PHYSICIAN:  Meade Maw, [address] [place],  [place] [address]  DOS: 11/16/21 C1-2 PSF for odontoid.   HISTORY OF PRESENT ILLNESS: Theresa Reid is status post C1-2 PSF. Overall, she is doing very well post-op.  She reports minimal neck pain.   PHYSICAL EXAMINATION:  NEUROLOGICAL:  General: In no acute distress.   Awake, alert, oriented to person, place, and time.  Pupils equal round and reactive to light.  Facial tone is symmetric.  Tongue protrusion is midline.  There is no pronator drift.  Strength: Side Biceps Triceps Deltoid Interossei Grip Wrist Ext. Wrist Flex.  R '5 5 5 5 5 5 5  '$ L '5 5 5 5 5 5 5   '$ Incision c/d/I and healing well  Imaging:  No interval imaging to review.  Assessment / Plan: KAILONI VAHLE is doing well after C1-2 fusion. We discussed activity escalation.  She is doing extremely well. I will see her in 6 weeks with xrays.  Meade Maw MD Dept of Neurosurgery

## 2022-01-02 ENCOUNTER — Ambulatory Visit: Payer: Medicare Other | Attending: Oncology | Admitting: Occupational Therapy

## 2022-01-02 ENCOUNTER — Telehealth: Payer: Self-pay | Admitting: *Deleted

## 2022-01-02 ENCOUNTER — Encounter: Payer: Self-pay | Admitting: Occupational Therapy

## 2022-01-02 DIAGNOSIS — I972 Postmastectomy lymphedema syndrome: Secondary | ICD-10-CM | POA: Insufficient documentation

## 2022-01-02 NOTE — Telephone Encounter (Signed)
Theresa Reid sent me my chat for a new sleeve for the pt. The order with notes were sent to [facility] and the fax transmission complete

## 2022-01-02 NOTE — Therapy (Signed)
[facility] [address], [place], [address] Phone: [phone]   Fax:  [phone]  Occupational Therapy Treatment/recert  Patient Details  Name: Theresa Reid MRN: [id number] Date of Birth: 05/17/1940 No data recorded  Encounter Date: 01/02/2022   OT End of Session - 01/02/22 1513     Visit Number 17    Number of Visits 18    Date for OT Re-Evaluation 02/13/22    OT Start Time 1432    OT Stop Time 1500    OT Time Calculation (min) 28 min    Activity Tolerance Patient tolerated treatment well    Behavior During Therapy [facility] for tasks assessed/performed             Past Medical History:  Diagnosis Date   Arthritis    Breast cancer ([place])    Diverticulitis    Family history of breast cancer    GERD (gastroesophageal reflux disease)    Hyperlipidemia    Hypertension    Lymphedema    right arm   Personal history of chemotherapy    Personal history of radiation therapy    Pre-diabetes     Past Surgical History:  Procedure Laterality Date   APPLICATION OF INTRAOPERATIVE CT SCAN N/A 11/16/2021   Procedure: APPLICATION OF INTRAOPERATIVE CT SCAN;  Surgeon: Meade Maw, MD;  Location: [facility] OR[facility];  Service: Neurosurgery;  Laterality: N/A;   BREAST BIOPSY Right 05/28/2018   [place] bx, INVASIVE MAMMARY CARCINOMA WITH LOBULAR FEATURES   BREAST LUMPECTOMY Right 06/12/2018   [place], lobular features   BREAST LUMPECTOMY WITH SENTINEL LYMPH NODE BIOPSY Right 06/12/2018   Procedure: RIGHT BREAST WIDE EXCISION WITH SENTINEL LYMPH NODE BX;  Surgeon: Robert Bellow, MD;  Location: [facility];  Service: General;  Laterality: Right;   [place] Right 10/30/2018   Procedure: IRRIGATION AND DEBRIDEMENT RIGHT CHEST WALL WOUND;  Surgeon: Robert Bellow, MD;  Location: [facility];  Service: General;  Laterality: Right;   MASTECTOMY Right     MASTECTOMY WITH AXILLARY LYMPH NODE DISSECTION Right 09/20/2018   Procedure: MASTECTOMY WITH AXILLARY LYMPH NODE DISSECTION RIGHT;  Surgeon: Robert Bellow, MD;  Location: [facility];  Service: General;  Laterality: Right;   ovaraian cyst removal Right    Cyst only (NOT OVARY)   PORTACATH PLACEMENT Left 09/20/2018   Procedure: INSERTION PORT-A-CATH, LEFT;  Surgeon: Robert Bellow, MD;  Location: [facility] OR[facility];  Service: General;  Laterality: Left;   POSTERIOR CERVICAL FUSION/FORAMINOTOMY N/A 11/16/2021   Procedure: C1-2 POSTERIOR FUSION WITH ORIF (OPEN REDUCTION INTERNAL FIXATION) C2 FRACTURE ([place]);  Surgeon: Meade Maw, MD;  Location: [facility] OR[facility];  Service: Neurosurgery;  Laterality: N/A;   TOTAL KNEE ARTHROPLASTY Bilateral 05/05/2010    There were no vitals filed for this visit.   Subjective Assessment - 01/02/22 1509     Subjective  I fell since I have seen you and had fusion of my neck about 6 wks ago- did see you in March but we did not get replacement sleeve at that time- my neck stiff now - did move closer to my daughter last month    Pertinent History Patient had her original lumpectomy and sentinel lymph node biopsy back in February 2020 R breast   She then had an MRI which showed more areas of enhancement and was taken back to the OR for mastectomy and  axillary lymph node dissection on 09/20/2018 which showed 15 of the lymph nodes were also positive for malignancy and this upstaged her to stage IIIa disease.  Plan was to offer her adjuvant chemotherapy following this.  However her mastectomy course was complicated by flap necrosis requiring debridement antibiotics and she had to be also taken to the OR on 10/30/2018.  Her wound did not completely healed  and she still has a drain in place.  She seen  Dr. Dahlia Byes on 11/18/2018.  There is concern for early cellulitis of her chest wall.  Dr. Dahlia Byes after his visit started  patient on Augmentin for 2 more weeks which further delays  her chemotherapy. Lymphedema in R UE per pt developed after surgery on 10/30/2018 - stayed the same with compression on R UE and use of jovipak breast pad  - denies pain - pt comes every about 5-7 months for re assessent and getting replacement for her daytime compression sleeve- was seen March 23 and did not get replacement - pt fell about 2 months ago and ended up with neck fx and had cervcal fusion of C1 and C2 about 6 wks ago    Patient Stated Goals I want to see if need new compression sleeve to prevent lymphedema to increase and infection    Currently in Pain? No/denies                 LYMPHEDEMA/ONCOLOGY QUESTIONNAIRE - 01/02/22 0001       Right Upper Extremity Lymphedema   15 cm Proximal to Olecranon Process 38 cm    10 cm Proximal to Olecranon Process 37 cm    Olecranon Process 25 cm    15 cm Proximal to Ulnar Styloid Process 22 cm    10 cm Proximal to Ulnar Styloid Process 19 cm    Just Proximal to Ulnar Styloid Process 14.8 cm    Across Hand at [employer] 17.2 cm      Left Upper Extremity Lymphedema   15 cm Proximal to Olecranon Process 37 cm    10 cm Proximal to Olecranon Process 37 cm    Olecranon Process 25.5 cm    15 cm Proximal to Ulnar Styloid Process 21.7 cm    10 cm Proximal to Ulnar Styloid Process 19 cm    Just Proximal to Ulnar Styloid Process 15 cm    Across Hand at [employer] 17.5 cm              TODAY'S TREATMENT:  Pt was seen last time 07/11/21 Patient arrive with right Jobst Elvarex compression sleeve and glove.  Wearing it daily reported doing well and able to maintain her circumference in the right upper extremity. Lymphedema measurements within normal limits compared to her left upper extremity and compare to the past  She did had fall since last time and had Fusion of C1-C2 about 6 wks ago - doing well She did not need new compression in March but due for new one this date. Patient was just fitted with a new compression sleeve in  December 2022 and glove Pt do not need new glove at this time- hand doing well   PATIENT EDUCATION: Circumference for lymphedema maintained continue compression Reinforce to use her jovipak breast pad for thoracic lymphedema Pt to follow up with me when she gets her new compression sleeve                OT Education - 01/02/22 1511  Education Details compression sleeve and garments    Person(s) Educated Patient    Methods Explanation;Demonstration;Tactile cues;Verbal cues;Handout    Comprehension Returned demonstration;Verbalized understanding              OT Short Term Goals - 07/11/21 1502       OT SHORT TERM GOAL #1   Title Pt's R UE circumference decrease by at least 1cm -2 cm from hand to upper arm - to be fitted for appropriate compression garments    Status Achieved               OT Long Term Goals - 01/02/22 1514       OT LONG TERM GOAL #2   Title Pt maintain her R UE and thoracic lymphedema  circumference with correct compression garments and HEP    Baseline She is wearing jovipak breastpad but per pt not as she should -and her daytime sleeve and glove older than 7 months - need new    Time 6    Period Weeks    Status New    Target Date 02/13/22                   Plan - 01/02/22 1514     Clinical Impression Statement Pt with diagnosis of R UE lymphedema - she done great thru the years to maintain her R UE circumference with Jobst Elvarex sleeve and glove and jovipak breast pad as needed- was fitted in Dec 22 last time -and did not need new compression sleeve when seen in March 23 - pt return this date. Circumference in R UE compare to in the past WNL using Jobst Elvarex soft sleeve and glove- pt due for new compression sleeve- will ask for precription and pink ribbon assistance - pt to get with [facility] to get measured for new sleeve. Per pt she has 2  gloves that still is working well. Recommend for her to use her Jovipak  breastpad  as needed night time or day time to  clear thoracic lymphedema - pt to contact me when she get her new sleeve and glove to assess fit Pt did had fall since seen last time but no fear for falling - had about 6 wks ago C1-C2 fusion - but doing well - moved closer to her daughter since then.    OT Occupational Profile and History Problem Focused Assessment - Including review of records relating to presenting problem    Occupational performance deficits (Please refer to evaluation for details): ADL's;Play;Leisure;IADL's    Body Structure / Function / Physical Skills ADL;UE functional use;Scar mobility;Skin integrity;Edema    Rehab Potential Good    Clinical Decision Making Several treatment options, min-mod task modification necessary    Comorbidities Affecting Occupational Performance: May have comorbidities impacting occupational performance    Modification or Assistance to Complete Evaluation  No modification of tasks or assist necessary to complete eval    OT Frequency --   follow up when getting sleeve   OT Duration 6 weeks    OT Treatment/Interventions Self-care/ADL training;Manual lymph drainage;Therapeutic exercise;Patient/family education;Manual Therapy;Compression bandaging    Consulted and Agree with Plan of Care Patient             Patient will benefit from skilled therapeutic intervention in order to improve the following deficits and impairments:   Body Structure / Function / Physical Skills: ADL, UE functional use, Scar mobility, Skin integrity, Edema       Visit Diagnosis: Postmastectomy  lymphedema syndrome - Plan: Ot plan of care cert/re-cert    Problem List Patient Active Problem List   Diagnosis Date Noted   S/P cervical spinal fusion 11/16/2021   Dens fracture ([place]) 11/04/2021   Pain in right shoulder 09/27/2021   Prediabetes 12/16/2020   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 [place], [address] Phone: [phone]   Fax:  [phone]  Name: Theresa Reid MRN: [id number] Date of Birth: [birth year]

## 2022-01-04 ENCOUNTER — Encounter: Payer: Self-pay | Admitting: Family Medicine

## 2022-01-04 ENCOUNTER — Ambulatory Visit (INDEPENDENT_AMBULATORY_CARE_PROVIDER_SITE_OTHER): Payer: Medicare Other | Admitting: Family Medicine

## 2022-01-04 VITALS — BP 130/80 | HR 76 | Temp 98.0°F | Ht 65.0 in | Wt 187.6 lb

## 2022-01-04 DIAGNOSIS — R5382 Chronic fatigue, unspecified: Secondary | ICD-10-CM | POA: Diagnosis not present

## 2022-01-04 DIAGNOSIS — S12100A Unspecified displaced fracture of second cervical vertebra, initial encounter for closed fracture: Secondary | ICD-10-CM

## 2022-01-04 DIAGNOSIS — K219 Gastro-esophageal reflux disease without esophagitis: Secondary | ICD-10-CM

## 2022-01-04 DIAGNOSIS — I10 Essential (primary) hypertension: Secondary | ICD-10-CM | POA: Diagnosis not present

## 2022-01-04 DIAGNOSIS — Z23 Encounter for immunization: Secondary | ICD-10-CM | POA: Diagnosis not present

## 2022-01-04 DIAGNOSIS — R5383 Other fatigue: Secondary | ICD-10-CM | POA: Insufficient documentation

## 2022-01-04 LAB — TSH: TSH: 1.95 u[IU]/mL (ref 0.35–5.50)

## 2022-01-04 LAB — VITAMIN D 25 HYDROXY (VIT D DEFICIENCY, FRACTURES): VITD: 34.82 ng/mL (ref 30.00–100.00)

## 2022-01-04 LAB — VITAMIN B12: Vitamin B-12: 367 pg/mL (ref 211–911)

## 2022-01-04 NOTE — Assessment & Plan Note (Signed)
Adequately controlled.  She will continue amlodipine 5 mg daily. 

## 2022-01-04 NOTE — Assessment & Plan Note (Signed)
Asymptomatic.  Continue omeprazole 20 mg daily.

## 2022-01-04 NOTE — Assessment & Plan Note (Signed)
Doing well.  She will continue to follow with neurosurgery.

## 2022-01-04 NOTE — Assessment & Plan Note (Signed)
Chronic ongoing issue.  Recent CBC and CMP without any significant cause of her fatigue.  We will check TSH, B12, and vitamin D.

## 2022-01-04 NOTE — Progress Notes (Signed)
Tommi Rumps, MD Phone: [phone]  Theresa Reid is a 81 y.o. female who presents today for f/u.  HYPERTENSION Disease Monitoring Home BP Monitoring not checking Chest pain- no    Dyspnea- no Medications Compliance-  taking amlodipine.  Edema- no BMET    Component Value Date/Time   NA 137 12/20/2021 1311   NA 142 07/16/2015 0000   NA 138 11/08/2012 0548   K 3.4 (L) 12/20/2021 1311   K 3.3 (L) 11/08/2012 0548   CL 106 12/20/2021 1311   CL 103 11/08/2012 0548   CO2 25 12/20/2021 1311   CO2 28 11/08/2012 0548   GLUCOSE 104 (H) 12/20/2021 1311   GLUCOSE 97 11/08/2012 0548   BUN 18 12/20/2021 1311   BUN 10 07/16/2015 0000   BUN 6 (L) 11/08/2012 0548   CREATININE 0.58 12/20/2021 1311   CREATININE 0.60 11/08/2012 0548   CALCIUM 8.5 (L) 12/20/2021 1311   CALCIUM 8.7 11/08/2012 0548   GFRNONAA >60 12/20/2021 1311   GFRNONAA >60 11/08/2012 0548   GFRAA >60 12/08/2019 1247   GFRAA >60 11/08/2012 0548   GERD:   Reflux symptoms: no   Abd pain: no   Blood in stool: no  Dysphagia: no   EGD: no  Medication: omeprazole  Dens fracture: Patient had a fall down 6 stairs at her house.  She notes she picked up a package and then just fell over.  She underwent cervical spine fusion and has tolerated that well.  She notes no pain.  No numbness.  No weakness.  No tingling in her arms or legs.  She continues to see neurosurgery.  Fatigue: Patient notes this has been going on since she had chemotherapy in 2020.  Notes her energy level has gone down since then.  She notes she sleeps well.  She is not sleepy during the day.  No chest pain or shortness of breath.  No depression or anxiety.  She notes she is able to do all her housework.   Social History   Tobacco Use  Smoking Status Never  Smokeless Tobacco Never    Current Outpatient Medications on File Prior to Visit  Medication Sig Dispense Refill   acetaminophen (TYLENOL) 325 MG tablet Take 2 tablets (650 mg total) by mouth  every 6 (six) hours as needed for mild pain (or Fever >/= 101).     amLODipine (NORVASC) 5 MG tablet TAKE 1 TABLET BY MOUTH  DAILY (Patient taking differently: Take 5 mg by mouth at bedtime.) 90 tablet 3   anastrozole (ARIMIDEX) 1 MG tablet TAKE 1 TABLET BY MOUTH  DAILY DO NOT START UNTIL  COMPLETED RADIATION IN JAN  2021 (Patient taking differently: Take 1 mg by mouth every morning.) 90 tablet 3   atorvastatin (LIPITOR) 80 MG tablet TAKE 1 TABLET BY MOUTH  DAILY (Patient taking differently: Take 80 mg by mouth at bedtime.) 90 tablet 3   CALCIUM-VITAMIN D PO Take 1 tablet by mouth daily.      ketoconazole (NIZORAL) 2 % shampoo Apply 1 application. topically 2 (two) times a week. (Patient taking differently: Apply 1 application  topically once a week.) 120 mL 6   Multiple Vitamins tablet Take 1 tablet by mouth daily.      omeprazole (PRILOSEC) 20 MG capsule TAKE 1 CAPSULE BY MOUTH DAILY (Patient taking differently: 20 mg every morning.) 100 capsule 2   oxybutynin (DITROPAN) 5 MG tablet TAKE 1 TABLET BY MOUTH  TWICE DAILY (Patient taking differently: Take 5 mg by  mouth 2 (two) times daily.) 180 tablet 3   predniSONE (DELTASONE) 5 MG tablet prednisone 5 mg tablet  TAKE 1 TO 2 TABLETS BY MOUTH EVERY DAY AS NEEDED FOR ARTHRITIS FLARE 90 tablet 0   [DISCONTINUED] prochlorperazine (COMPAZINE) 10 MG tablet Take 1 tablet (10 mg total) by mouth every 6 (six) hours as needed (Nausea or vomiting). 30 tablet 1   No current facility-administered medications on file prior to visit.     ROS see history of present illness  Objective  Physical Exam Vitals:   01/04/22 1101  BP: 130/80  Pulse: 76  Temp: 98 F (36.7 C)  SpO2: 97%    BP Readings from Last 3 Encounters:  01/04/22 130/80  12/29/21 (!) 159/98  12/20/21 137/70   Wt Readings from Last 3 Encounters:  01/04/22 187 lb 9.6 oz (85.1 kg)  12/29/21 181 lb 3.2 oz (82.2 kg)  12/20/21 187 lb 8 oz (85 kg)    Physical Exam Constitutional:       General: She is not in acute distress.    Appearance: She is not diaphoretic.  Cardiovascular:     Rate and Rhythm: Normal rate and regular rhythm.     Heart sounds: Normal heart sounds.  Pulmonary:     Effort: Pulmonary effort is normal.     Breath sounds: Normal breath sounds.  Musculoskeletal:     Comments: No midline neck tenderness, no midline neck step-off, well-healing surgical scar midline posterior neck  Skin:    General: Skin is warm and dry.  Neurological:     Mental Status: She is alert.      Assessment/Plan: Please see individual problem list.  Problem List Items Addressed This Visit     GERD (gastroesophageal reflux disease) (Chronic)    Asymptomatic.  Continue omeprazole 20 mg daily.      Hypertension - Primary (Chronic)    Adequately controlled.  She will continue amlodipine 5 mg daily.      Dens fracture ([place])    Doing well.  She will continue to follow with neurosurgery.      Fatigue    Chronic ongoing issue.  Recent CBC and CMP without any significant cause of her fatigue.  We will check TSH, B12, and vitamin D.      Relevant Orders   TSH   B12   Vitamin D (25 hydroxy)   Other Visit Diagnoses     Need for immunization against influenza       Relevant Orders   Flu Vaccine QUAD High Dose(Fluad) (Completed)        Health Maintenance: She was encouraged to get her second shingles vaccine.  Return in about 6 months (around 07/05/2022).   Tommi Rumps, MD [place]

## 2022-01-04 NOTE — Patient Instructions (Signed)
Nice to see you. We will get lab work today and contact you with the results. 

## 2022-01-16 ENCOUNTER — Ambulatory Visit: Payer: Medicare Other | Admitting: Radiation Oncology

## 2022-01-19 ENCOUNTER — Ambulatory Visit
Admission: RE | Admit: 2022-01-19 | Discharge: 2022-01-19 | Disposition: A | Discharge status: Home / Self Care | Payer: Medicare Other | Source: Ambulatory Visit | Attending: Radiation Oncology | Admitting: Radiation Oncology

## 2022-01-19 ENCOUNTER — Encounter: Payer: Self-pay | Admitting: Radiation Oncology

## 2022-01-19 VITALS — BP 148/62 | HR 85 | Temp 97.0°F | Ht 65.0 in | Wt 187.0 lb

## 2022-01-19 DIAGNOSIS — C50911 Malignant neoplasm of unspecified site of right female breast: Secondary | ICD-10-CM | POA: Diagnosis not present

## 2022-01-19 DIAGNOSIS — Z853 Personal history of malignant neoplasm of breast: Secondary | ICD-10-CM | POA: Diagnosis not present

## 2022-01-19 DIAGNOSIS — Z923 Personal history of irradiation: Secondary | ICD-10-CM | POA: Insufficient documentation

## 2022-01-19 DIAGNOSIS — Z170 Estrogen receptor positive status [ER+]: Secondary | ICD-10-CM | POA: Diagnosis not present

## 2022-01-19 NOTE — Progress Notes (Signed)
Radiation Oncology Follow up Note  Name: Theresa Reid   Date:   01/19/2022 MRN:  [id number] DOB: 01/21/1941    This 81 y.o. 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  REFERRING PROVIDER: Leone Haven, MD  HPI: Patient is an 81 year old female now out 2-1/2 years having completed right chest wall and peripheral lymphatic radiation therapy for a T2 N3 M0 ER/PR positive invasive mammary carcinoma.  Seen today in routine follow-up she is doing well specifically denies any chest wall masses or nodularity cough or bone pain.  She does wear a lymphedema sleeve which she said helps during the day.  She had a fall this summer and had an acute oblique type II dens fracture of C1.  She also has subtle acute compression fractures involving endplates of Y2-Q8 and T4.  No evidence of metastatic disease is noted.  She also had a mammogram 6 months ago of her left breast which was BI-RADS 2 benign I have reviewed both MRI scans CT scans and mammograms.  COMPLICATIONS OF TREATMENT: none  FOLLOW UP COMPLIANCE: keeps appointments   PHYSICAL EXAM:  BP (!) 148/62   Pulse 85   Temp (!) 97 F (36.1 C)   Ht '5\' 5"'$  (1.651 m)   Wt 187 lb (84.8 kg)   BMI 31.12 kg/m  Patient is status post right modified radical mastectomy chest wall is well-healed without evidence of mass or nodularity.  Left breast is free of dominant mass no axillary or supraclavicular adenopathy is identified.  Well-developed well-nourished patient in NAD. HEENT reveals PERLA, EOMI, discs not visualized.  Oral cavity is clear. No oral mucosal lesions are identified. Neck is clear without evidence of cervical or supraclavicular adenopathy. Lungs are clear to A&P. Cardiac examination is essentially unremarkable with regular rate and  rhythm without murmur rub or thrill. Abdomen is benign with no organomegaly or masses noted. Motor sensory and DTR levels are equal and symmetric in the upper and lower extremities. Cranial nerves II through XII are grossly intact. Proprioception is intact. No peripheral adenopathy or edema is identified. No motor or sensory levels are noted. Crude visual fields are within normal range.  RADIOLOGY RESULTS: CT scans MRI scans and mammograms reviewed compatible with above-stated findings  PLAN: At the present time patient is doing well now out 2 and half years from right chest wall radiation with no evidence of disease.  I am going to discontinue follow-up care based on her age her mobility and transportation needs.  She continues close follow-up care with medical oncology.  Patient knows to call with any concerns at any time.  I would like to take this opportunity to thank you for allowing me to participate in the care of your patient.Noreene Filbert, MD

## 2022-02-07 ENCOUNTER — Other Ambulatory Visit: Payer: Self-pay

## 2022-02-07 DIAGNOSIS — Z981 Arthrodesis status: Secondary | ICD-10-CM

## 2022-02-09 ENCOUNTER — Ambulatory Visit
Admission: RE | Admit: 2022-02-09 | Discharge: 2022-02-09 | Disposition: A | Discharge status: Home / Self Care | Payer: Medicare Other | Attending: Neurosurgery | Admitting: Neurosurgery

## 2022-02-09 ENCOUNTER — Ambulatory Visit
Admission: RE | Admit: 2022-02-09 | Discharge: 2022-02-09 | Disposition: A | Discharge status: Home / Self Care | Payer: Medicare Other | Source: Ambulatory Visit | Attending: Neurosurgery | Admitting: Neurosurgery

## 2022-02-09 ENCOUNTER — Ambulatory Visit (INDEPENDENT_AMBULATORY_CARE_PROVIDER_SITE_OTHER): Payer: Medicare Other | Admitting: Neurosurgery

## 2022-02-09 ENCOUNTER — Encounter: Payer: Self-pay | Admitting: Neurosurgery

## 2022-02-09 VITALS — BP 130/76 | Ht 65.0 in | Wt 187.0 lb

## 2022-02-09 DIAGNOSIS — Z981 Arthrodesis status: Secondary | ICD-10-CM

## 2022-02-09 DIAGNOSIS — Z09 Encounter for follow-up examination after completed treatment for conditions other than malignant neoplasm: Secondary | ICD-10-CM

## 2022-02-09 DIAGNOSIS — M47812 Spondylosis without myelopathy or radiculopathy, cervical region: Secondary | ICD-10-CM | POA: Diagnosis not present

## 2022-02-09 DIAGNOSIS — S12111K Posterior displaced Type II dens fracture, subsequent encounter for fracture with nonunion: Secondary | ICD-10-CM

## 2022-02-09 DIAGNOSIS — M4322 Fusion of spine, cervical region: Secondary | ICD-10-CM | POA: Diagnosis not present

## 2022-02-09 NOTE — Progress Notes (Signed)
   REFERRING PHYSICIAN:  Alease Medina [address] [place],  [place] [address]  DOS: 11/16/21 C1-2 PSF for odontoid.   HISTORY OF PRESENT ILLNESS: Theresa Reid is status post C1-2 PSF. Overall, she is doing very well post-op.  She reports minimal neck pain.   PHYSICAL EXAMINATION:  NEUROLOGICAL:  General: In no acute distress.   Awake, alert, oriented to person, place, and time.  Pupils equal round and reactive to light.  Facial tone is symmetric.  Tongue protrusion is midline.  There is no pronator drift.  Strength: Side Biceps Triceps Deltoid Interossei Grip Wrist Ext. Wrist Flex.  R '5 5 5 5 5 5 5  '$ L '5 5 5 5 5 5 5   '$ Incision c/d/I and healing well  Imaging:  No complications noted  Assessment / Plan: Theresa Reid is doing well after C1-2 fusion. We discussed activity escalation.  She is doing extremely well. I will see her in 6 months with xrays.  Meade Maw MD Dept of Neurosurgery

## 2022-02-13 ENCOUNTER — Encounter: Payer: Medicare Other | Admitting: Occupational Therapy

## 2022-02-23 ENCOUNTER — Ambulatory Visit: Payer: Medicare Other | Attending: Oncology | Admitting: Occupational Therapy

## 2022-02-23 DIAGNOSIS — I972 Postmastectomy lymphedema syndrome: Secondary | ICD-10-CM | POA: Diagnosis not present

## 2022-02-23 NOTE — Therapy (Signed)
[facility] [address], [place], [address] Phone: [phone]   Fax:  [phone]  Occupational Therapy Treatment  Patient Details  Name: Theresa Reid MRN: [id number] Date of Birth: Apr 09, 1941 No data recorded  Encounter Date: 02/23/2022   OT End of Session - 02/23/22 1453     Visit Number 18    Number of Visits 18    Date for OT Re-Evaluation 02/23/22    OT Start Time 1420    OT Stop Time 1445    OT Time Calculation (min) 25 min    Activity Tolerance Patient tolerated treatment well    Behavior During Therapy WFL for tasks assessed/performed             Past Medical History:  Diagnosis Date   Arthritis    Breast cancer ([place])    Diverticulitis    Family history of breast cancer    GERD (gastroesophageal reflux disease)    Hyperlipidemia    Hypertension    Lymphedema    right arm   Personal history of chemotherapy    Personal history of radiation therapy    Pre-diabetes     Past Surgical History:  Procedure Laterality Date   APPLICATION OF INTRAOPERATIVE CT SCAN N/A 11/16/2021   Procedure: APPLICATION OF INTRAOPERATIVE CT SCAN;  Surgeon: Meade Maw, MD;  Location: [facility];  Service: Neurosurgery;  Laterality: N/A;   BREAST BIOPSY Right 05/28/2018   [place] bx, INVASIVE MAMMARY CARCINOMA WITH LOBULAR FEATURES   BREAST LUMPECTOMY Right 06/12/2018   [place], lobular features   BREAST LUMPECTOMY WITH SENTINEL LYMPH NODE BIOPSY Right 06/12/2018   Procedure: RIGHT BREAST WIDE EXCISION WITH SENTINEL LYMPH NODE BX;  Surgeon: Robert Bellow, MD;  Location: [facility] OR[facility];  Service: General;  Laterality: Right;   [place] Right 10/30/2018   Procedure: IRRIGATION AND DEBRIDEMENT RIGHT CHEST WALL WOUND;  Surgeon: Robert Bellow, MD;  Location: [facility];  Service: General;  Laterality: Right;   MASTECTOMY Right     MASTECTOMY WITH AXILLARY LYMPH NODE DISSECTION Right 09/20/2018   Procedure: MASTECTOMY WITH AXILLARY LYMPH NODE DISSECTION RIGHT;  Surgeon: Robert Bellow, MD;  Location: [facility];  Service: General;  Laterality: Right;   ovaraian cyst removal Right    Cyst only (NOT OVARY)   PORTACATH PLACEMENT Left 09/20/2018   Procedure: INSERTION PORT-A-CATH, LEFT;  Surgeon: Robert Bellow, MD;  Location: [facility];  Service: General;  Laterality: Left;   POSTERIOR CERVICAL FUSION/FORAMINOTOMY N/A 11/16/2021   Procedure: C1-2 POSTERIOR FUSION WITH ORIF (OPEN REDUCTION INTERNAL FIXATION) C2 FRACTURE ([place]);  Surgeon: Meade Maw, MD;  Location: [facility];  Service: Neurosurgery;  Laterality: N/A;   TOTAL KNEE ARTHROPLASTY Bilateral 05/05/2010    There were no vitals filed for this visit.   Subjective Assessment - 02/23/22 1453     Subjective  I got a new sleeve I tried to put it on but so really hard putting it on with this left thumb of mine.  As a lot of arthritis.    Pertinent History Patient had her original lumpectomy and sentinel lymph node biopsy back in February 2020 R breast   She then had an MRI which showed more areas of enhancement and was taken back to the OR for mastectomy and axillary lymph node dissection on 09/20/2018 which showed 15 of the lymph nodes were also  positive for malignancy and this upstaged her to stage IIIa disease.  Plan was to offer her adjuvant chemotherapy following this.  However her mastectomy course was complicated by flap necrosis requiring debridement antibiotics and she had to be also taken to the OR on 10/30/2018.  Her wound did not completely healed  and she still has a drain in place.  She seen  Dr. Dahlia Byes on 11/18/2018.  There is concern for early cellulitis of her chest wall.  Dr. Dahlia Byes after his visit started  patient on Augmentin for 2 more weeks which further delays her chemotherapy. Lymphedema in R UE per pt developed after surgery on 10/30/2018  - stayed the same with compression on R UE and use of jovipak breast pad  - denies pain - pt comes every about 5-7 months for re assessent and getting replacement for her daytime compression sleeve- was seen March 23 and did not get replacement - pt fell about 2 months ago and ended up with neck fx and had cervcal fusion of C1 and C2 about 6 wks ago    Patient Stated Goals I want to see if need new compression sleeve to prevent lymphedema to increase and infection    Currently in Pain? No/denies                 LYMPHEDEMA/ONCOLOGY QUESTIONNAIRE - 02/23/22 0001       Right Upper Extremity Lymphedema   15 cm Proximal to Olecranon Process 39 cm    10 cm Proximal to Olecranon Process 37 cm    Olecranon Process 24 cm    15 cm Proximal to Ulnar Styloid Process 21 cm    10 cm Proximal to Ulnar Styloid Process 18.5 cm    Just Proximal to Ulnar Styloid Process 14.4 cm    Across Hand at [employer] 17.2 cm               Patient arrives with right Jobst Elvarex compression sleeve and glove.  Patient bring in new sleeve to be started by OT.   She is wearing her compression daily and doing well and able to maintain her circumference in the right upper extremity. Fitted patient with new compression sleeve fitting well.  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  Reviewed with patient new technique to don her compression sleeve in supine using gravity to ease or stabilization and weightbearing through palm on wall donning. Patient was fitted with a CMC neoprene wrap to use on left hand with donning of compression sleeve. Lymphedema measurements within normal limits compared to her left upper extremity and prior measurements. Glove still fitting well. Patient to follow-up with me as needed in 7 to 9 months.              OT Education - 02/23/22 1453     Education Details Wearing compression sleeve and garments    Person(s) Educated Patient     Methods Explanation;Demonstration;Tactile cues;Verbal cues;Handout    Comprehension Returned demonstration;Verbalized understanding              OT Short Term Goals - 07/11/21 1502       OT SHORT TERM GOAL #1   Title Pt's R UE circumference decrease by at least 1cm -2 cm from hand to upper arm - to be fitted for appropriate compression garments    Status Achieved               OT Long  Term Goals - 02/23/22 1458       OT LONG TERM GOAL #2   Title Pt maintain her R UE and thoracic lymphedema  circumference with correct compression garments and HEP    Baseline She is wearing jovipak breastpad but per pt not as she should -and fitted  with new daytime sleeve and  glove still has some good compression    Status Achieved                   Plan - 02/23/22 1454     Clinical Impression Statement Pt with diagnosis of R UE lymphedema - she done great thru the years to maintain her R UE circumference with Jobst Elvarex sleeve and glove and jovipak breast pad as needed- was fitted in Dec 22 last time -and did not need new compression sleeve when seen in March 23 . Circumference in R UE compare to in the past WNL using Jobst Elvarex soft sleeve and glove- pt due for new compression sleeve -patient came in today with a new sleeve to be fitted. Per pt she has 2  gloves that still is working well. Recommend for her to use her Jovipak breastpad  as needed night time or day time to  clear thoracic lymphedema.  Patient do have some left thumb CMC arthritis provided patient with a CMC neoprene brace to use for donning new compression sleeve.  Educated him in new technique donning garment and supine that gravity can test or she can wait.  Through her hand on the wall blocking elbow while donning new sleeve.  Patient had increased ease with new change just. Pt to follow up with me as needed 6-9 months    OT Occupational Profile and History Problem Focused Assessment - Including review of  records relating to presenting problem    Occupational performance deficits (Please refer to evaluation for details): ADL's;Play;Leisure;IADL's    Body Structure / Function / Physical Skills ADL;UE functional use;Scar mobility;Skin integrity;Edema    Rehab Potential Good    Clinical Decision Making Several treatment options, min-mod task modification necessary    Comorbidities Affecting Occupational Performance: May have comorbidities impacting occupational performance    Modification or Assistance to Complete Evaluation  No modification of tasks or assist necessary to complete eval    OT Frequency 1x / week    OT Duration --   1 wks   OT Treatment/Interventions Self-care/ADL training;Manual lymph drainage;Therapeutic exercise;Patient/family education;Manual Therapy;Compression bandaging    Consulted and Agree with Plan of Care Patient             Patient will benefit from skilled therapeutic intervention in order to improve the following deficits and impairments:   Body Structure / Function / Physical Skills: ADL, UE functional use, Scar mobility, Skin integrity, Edema       Visit Diagnosis: Postmastectomy lymphedema syndrome    Problem List Patient Active Problem List   Diagnosis Date Noted   Fatigue 01/04/2022   S/P cervical spinal fusion 11/16/2021   Dens fracture ([place]) 11/04/2021   Pain in right shoulder 09/27/2021   Prediabetes 12/16/2020   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 [place], [address] Phone: [phone]   Fax:  [phone]  Name: MIRELLE BISKUP MRN: [id number] Date of Birth: 1941/03/22

## 2022-03-15 ENCOUNTER — Ambulatory Visit: Payer: Medicare Other | Admitting: Family Medicine

## 2022-03-22 ENCOUNTER — Inpatient Hospital Stay: Payer: Medicare Other | Attending: Oncology

## 2022-03-22 DIAGNOSIS — Z170 Estrogen receptor positive status [ER+]: Secondary | ICD-10-CM | POA: Insufficient documentation

## 2022-03-22 DIAGNOSIS — Z95828 Presence of other vascular implants and grafts: Secondary | ICD-10-CM

## 2022-03-22 DIAGNOSIS — C50911 Malignant neoplasm of unspecified site of right female breast: Secondary | ICD-10-CM | POA: Insufficient documentation

## 2022-03-22 DIAGNOSIS — Z452 Encounter for adjustment and management of vascular access device: Secondary | ICD-10-CM | POA: Diagnosis not present

## 2022-03-22 MED ORDER — HEPARIN SOD (PORK) LOCK FLUSH 100 UNIT/ML IV SOLN
500.0000 [IU] | Freq: Once | INTRAVENOUS | Status: AC
  Administered 2022-03-22: 500 [IU] via INTRAVENOUS
  Filled 2022-03-22: qty 5

## 2022-03-22 MED ORDER — SODIUM CHLORIDE 0.9% FLUSH
10.0000 mL | INTRAVENOUS | Status: DC | PRN
  Administered 2022-03-22: 10 mL via INTRAVENOUS
  Filled 2022-03-22: qty 10

## 2022-03-22 NOTE — Patient Instructions (Signed)

## 2022-03-26 ENCOUNTER — Ambulatory Visit: Payer: Medicare Other

## 2022-05-03 ENCOUNTER — Ambulatory Visit (INDEPENDENT_AMBULATORY_CARE_PROVIDER_SITE_OTHER): Payer: Medicare Other | Admitting: Family Medicine

## 2022-05-03 ENCOUNTER — Encounter: Payer: Self-pay | Admitting: Family Medicine

## 2022-05-03 VITALS — BP 130/70 | HR 83 | Temp 97.9°F | Ht 65.0 in | Wt 187.2 lb

## 2022-05-03 DIAGNOSIS — M25512 Pain in left shoulder: Secondary | ICD-10-CM | POA: Diagnosis not present

## 2022-05-03 DIAGNOSIS — S46811A Strain of other muscles, fascia and tendons at shoulder and upper arm level, right arm, initial encounter: Secondary | ICD-10-CM

## 2022-05-03 DIAGNOSIS — I10 Essential (primary) hypertension: Secondary | ICD-10-CM

## 2022-05-03 DIAGNOSIS — Z8719 Personal history of other diseases of the digestive system: Secondary | ICD-10-CM | POA: Diagnosis not present

## 2022-05-03 DIAGNOSIS — M25511 Pain in right shoulder: Secondary | ICD-10-CM | POA: Diagnosis not present

## 2022-05-03 DIAGNOSIS — E785 Hyperlipidemia, unspecified: Secondary | ICD-10-CM

## 2022-05-03 DIAGNOSIS — S46819A Strain of other muscles, fascia and tendons at shoulder and upper arm level, unspecified arm, initial encounter: Secondary | ICD-10-CM

## 2022-05-03 DIAGNOSIS — M159 Polyosteoarthritis, unspecified: Secondary | ICD-10-CM | POA: Diagnosis not present

## 2022-05-03 NOTE — Assessment & Plan Note (Addendum)
No symptoms today. Advised patient to continue with her current routine during flares: liquid diet for a day, then gently easing back into her regular diet, and avoiding triggering foods. She will let us know if she develops nausea/vomiting/bloody stools or if she has persistent symptoms.

## 2022-05-03 NOTE — Progress Notes (Signed)
Patient seen along with medical student Zada Zhong.  I personally evaluated this patient along with the student, and verified all aspects of the history, physical exam, and medical decision making as documented by the student.  I agree with the student's documentation and have made all necessary edits.  Ursala Cressy, MD  

## 2022-05-03 NOTE — Progress Notes (Signed)
Tommi Rumps, MD Phone: [phone]  Theresa Reid is a 82 y.o. female who presents today for f/u.  Hypertension: patient reports that she has a wrist cuff at home but it is inaccurate so she doesn't check her pressures at home. She takes her amlodipine daily and denies chest pain/shortness of breath/edema in her legs.  Hyperlipidemia: patient currently taking her atorvastatin. She denies muscle or stomach aches.   Diverticulitis flares: patient reports that a couple of times a year she will have flares of diarrhea and localized left quadrant pain. She states that a liquid diet and gently easing back into her regular diet typically alleviates her symptoms. She denies nausea/vomiting/blood in stool during the diverticulitis episodes.   Shoulder aches: patient has had aching on bilateral shoulder blades that migrate up to her shoulders and left jaw since last summer. She was recommended some exercises that do alleviate the pain but she continues to have pain. She reports that daytime activity helps with the ache and sitting still or laying down at night exacerbates it. She rarely take tylenol for this. She has a history of C1-2 fusion. Still following with neurosurgery.   Social History   Tobacco Use  Smoking Status Never  Smokeless Tobacco Never    Current Outpatient Medications on File Prior to Visit  Medication Sig Dispense Refill   acetaminophen (TYLENOL) 325 MG tablet Take 2 tablets (650 mg total) by mouth every 6 (six) hours as needed for mild pain (or Fever >/= 101).     amLODipine (NORVASC) 5 MG tablet TAKE 1 TABLET BY MOUTH  DAILY (Patient taking differently: Take 5 mg by mouth at bedtime.) 90 tablet 3   anastrozole (ARIMIDEX) 1 MG tablet TAKE 1 TABLET BY MOUTH  DAILY DO NOT START UNTIL  COMPLETED RADIATION IN JAN  2021 (Patient taking differently: Take 1 mg by mouth every morning.) 90 tablet 3   atorvastatin (LIPITOR) 80 MG tablet TAKE 1 TABLET BY MOUTH  DAILY (Patient taking  differently: Take 80 mg by mouth at bedtime.) 90 tablet 3   CALCIUM-VITAMIN D PO Take 1 tablet by mouth daily.      ketoconazole (NIZORAL) 2 % shampoo Apply 1 application. topically 2 (two) times a week. (Patient taking differently: Apply 1 application  topically once a week.) 120 mL 6   Multiple Vitamins tablet Take 1 tablet by mouth daily.      omeprazole (PRILOSEC) 20 MG capsule TAKE 1 CAPSULE BY MOUTH DAILY (Patient taking differently: 20 mg every morning.) 100 capsule 2   oxybutynin (DITROPAN) 5 MG tablet TAKE 1 TABLET BY MOUTH  TWICE DAILY (Patient taking differently: Take 5 mg by mouth 2 (two) times daily.) 180 tablet 3   predniSONE (DELTASONE) 5 MG tablet prednisone 5 mg tablet  TAKE 1 TO 2 TABLETS BY MOUTH EVERY DAY AS NEEDED FOR ARTHRITIS FLARE 90 tablet 0   [DISCONTINUED] prochlorperazine (COMPAZINE) 10 MG tablet Take 1 tablet (10 mg total) by mouth every 6 (six) hours as needed (Nausea or vomiting). 30 tablet 1   No current facility-administered medications on file prior to visit.     ROS see history of present illness  Objective  Physical Exam Vitals:   05/03/22 1342  BP: 130/70  Pulse: 83  Temp: 97.9 F (36.6 C)  SpO2: 97%    BP Readings from Last 3 Encounters:  05/03/22 130/70  02/09/22 130/76  01/19/22 (!) 148/62   Wt Readings from Last 3 Encounters:  05/03/22 187 lb 3.2 oz (84.9  kg)  02/09/22 187 lb (84.8 kg)  01/19/22 187 lb (84.8 kg)    Physical Exam Constitutional:      Appearance: Normal appearance.  HENT:     Head: Normocephalic and atraumatic.  Cardiovascular:     Rate and Rhythm: Normal rate and regular rhythm.  Pulmonary:     Effort: Pulmonary effort is normal.     Breath sounds: Normal breath sounds.  Musculoskeletal:     Comments: Muscle spasm and tenderness to palpation over left and right trapezius muscles, both shoulders, and left neck, no midline neck or thoracic spine tenderness, no midline neck or thoracic spine step-off  Skin:     General: Skin is warm and dry.  Neurological:     General: No focal deficit present.     Mental Status: She is alert.     Sensory: Sensation is intact.     Motor: Motor function is intact.     Comments: 5/5 strength bilateral biceps, triceps, grip, quads, hamstrings, plantarflexion, and dorsiflexion, sensation to light touch intact bilateral upper and lower extremities      Assessment/Plan: Please see individual problem list.  Problem List Items Addressed This Visit     HLD (hyperlipidemia) (Chronic)    She will continue Lipitor 80 mg daily.       Hypertension (Chronic)    Adequately controlled.  She will continue amlodipine 5 mg daily.       History of diverticulitis    No symptoms today. Advised patient to continue with her current routine during flares: liquid diet for a day, then gently easing back into her regular diet, and avoiding triggering foods. She will let us know if she develops nausea/vomiting/bloody stools or if she has persistent symptoms.       Osteoarthritis of multiple joints    Chronic issue and stable. She will continue as needed over-the-counter Tylenol as needed.      Trapezius strain - Primary    Likely related to trapezius strain. Advised patient to apply dry heat to the area as needed and to not leave the heat on too long in case of burning. Patient can take ibuprofen '600mg'$  Q8 PRN for aches. We will also refer her to PT. I will also send a message to her neurosurgeon so he is aware of her going to physical therapy.      Relevant Orders   Ambulatory referral to Physical Therapy     Return in about 6 months (around 11/01/2022).   Marisa Cyphers, Medical Student [place]

## 2022-05-03 NOTE — Assessment & Plan Note (Signed)
Chronic issue and stable. She will continue as needed over-the-counter Tylenol as needed.

## 2022-05-03 NOTE — Assessment & Plan Note (Signed)
Adequately controlled.  She will continue amlodipine 5 mg daily.

## 2022-05-03 NOTE — Patient Instructions (Signed)
Nice to see you. I suspect you have some strain to the trapezius muscles.  I will refer you for physical therapy.  You can use heat on this for 10 minutes at a time several times a day.  You can also try ibuprofen 600 mg every 8 hours as needed for pain.  Please take this with food.  If it irritates her stomach please discontinue use of ibuprofen.  If you use this on a daily basis for more than a week or 2 please let me know so we can check your kidney function.

## 2022-05-03 NOTE — Assessment & Plan Note (Addendum)
She will continue Lipitor 80 mg daily.

## 2022-05-03 NOTE — Assessment & Plan Note (Addendum)
Likely related to trapezius strain. Advised patient to apply dry heat to the area as needed and to not leave the heat on too long in case of burning. Patient can take ibuprofen '600mg'$  Q8 PRN for aches. We will also refer her to PT. I will also send a message to her neurosurgeon so he is aware of her going to physical therapy.

## 2022-05-05 ENCOUNTER — Ambulatory Visit: Payer: Medicare Other | Admitting: Family Medicine

## 2022-05-11 ENCOUNTER — Other Ambulatory Visit: Payer: Self-pay | Admitting: Family Medicine

## 2022-05-11 DIAGNOSIS — Z1231 Encounter for screening mammogram for malignant neoplasm of breast: Secondary | ICD-10-CM

## 2022-05-12 ENCOUNTER — Other Ambulatory Visit: Payer: Self-pay | Admitting: Internal Medicine

## 2022-05-12 ENCOUNTER — Other Ambulatory Visit: Payer: Self-pay | Admitting: Family Medicine

## 2022-05-12 ENCOUNTER — Other Ambulatory Visit: Payer: Self-pay | Admitting: Oncology

## 2022-05-12 DIAGNOSIS — M159 Polyosteoarthritis, unspecified: Secondary | ICD-10-CM

## 2022-05-12 DIAGNOSIS — N3941 Urge incontinence: Secondary | ICD-10-CM

## 2022-05-12 DIAGNOSIS — I10 Essential (primary) hypertension: Secondary | ICD-10-CM

## 2022-05-12 DIAGNOSIS — Z8739 Personal history of other diseases of the musculoskeletal system and connective tissue: Secondary | ICD-10-CM

## 2022-05-12 NOTE — Telephone Encounter (Signed)
Next Visit: 10/02/2022  Last Visit: 09/27/2021  Last Fill: 10/04/2021  Dx: degenerative arthritis in multiple sites   Current Dose per office note on 09/27/2021: Her current steroid dosing 5-'10mg'$  daily PRN   Okay to refill Prednisone?

## 2022-05-17 DIAGNOSIS — M542 Cervicalgia: Secondary | ICD-10-CM | POA: Diagnosis not present

## 2022-05-18 DIAGNOSIS — H353132 Nonexudative age-related macular degeneration, bilateral, intermediate dry stage: Secondary | ICD-10-CM | POA: Diagnosis not present

## 2022-05-18 DIAGNOSIS — H2513 Age-related nuclear cataract, bilateral: Secondary | ICD-10-CM | POA: Diagnosis not present

## 2022-05-22 ENCOUNTER — Ambulatory Visit (INDEPENDENT_AMBULATORY_CARE_PROVIDER_SITE_OTHER): Payer: Medicare Other

## 2022-05-22 VITALS — BP 132/74 | HR 80 | Temp 97.1°F | Resp 16 | Ht 65.0 in | Wt 187.0 lb

## 2022-05-22 DIAGNOSIS — Z0000 Encounter for general adult medical examination without abnormal findings: Secondary | ICD-10-CM | POA: Diagnosis not present

## 2022-05-22 NOTE — Progress Notes (Addendum)
Subjective:   Theresa Reid is a 82 y.o. female who presents for Medicare Annual (Subsequent) preventive examination.  Review of Systems    No ROS.  Medicare Wellness.   Cardiac Risk Factors include: advanced age (>40mn, >>58women);hypertension     Objective:    Today's Vitals   05/22/22 1327  BP: 132/74  Pulse: 80  Resp: 16  Temp: (!) 97.1 F (36.2 C)  SpO2: 97%  Weight: 187 lb (84.8 kg)  Height: '5\' 5"'$  (1.651 m)   Body mass index is 31.12 kg/m.     05/22/2022    1:44 PM 12/20/2021    1:33 PM 11/16/2021    1:17 PM 11/14/2021   11:37 AM 11/04/2021   10:22 PM 11/04/2021    5:26 PM 10/01/2021    2:10 PM  Advanced Directives  Does Patient Have a Medical Advance Directive? Yes Yes Yes Yes Yes Yes No  Type of AParamed[place]of [place] will Living will;Healthcare Power of ALower BurrellLiving will  Living will [place]   Does patient want to make changes to medical advance directive? No - Patient declined  No - Patient declined  No - Patient declined    Copy of [place] Chart? Yes - validated most recent copy scanned in chart (See row information)        Would patient like information on creating a medical advance directive?       No - Guardian declined    Current Medications (verified) Outpatient Encounter Medications as of 05/22/2022  Medication Sig   acetaminophen (TYLENOL) 325 MG tablet Take 2 tablets (650 mg total) by mouth every 6 (six) hours as needed for mild pain (or Fever >/= 101).   amLODipine (NORVASC) 5 MG tablet TAKE 1 TABLET BY MOUTH DAILY   anastrozole (ARIMIDEX) 1 MG tablet TAKE 1 TABLET BY MOUTH  DAILY DO NOT START UNTIL  COMPLETED RADIATION IN JAN  2021 (Patient taking differently: Take 1 mg by mouth every morning.)   atorvastatin (LIPITOR) 80 MG tablet TAKE 1 TABLET BY MOUTH ONCE  DAILY   CALCIUM-VITAMIN D PO Take 1 tablet by mouth daily.     ketoconazole (NIZORAL) 2 % shampoo Apply 1 application. topically 2 (two) times a week. (Patient taking differently: Apply 1 application  topically once a week.)   Multiple Vitamins tablet Take 1 tablet by mouth daily.    omeprazole (PRILOSEC) 20 MG capsule TAKE 1 CAPSULE BY MOUTH DAILY (Patient taking differently: 20 mg every morning.)   oxybutynin (DITROPAN) 5 MG tablet TAKE 1 TABLET BY MOUTH TWICE  DAILY   predniSONE (DELTASONE) 5 MG tablet TAKE 1 TO 2 TABLETS BY MOUTH  DAILY AS NEEDED FOR ARTHRITIS  FLARE   [DISCONTINUED] prochlorperazine (COMPAZINE) 10 MG tablet Take 1 tablet (10 mg total) by mouth every 6 (six) hours as needed (Nausea or vomiting).   No facility-administered encounter medications on file as of 05/22/2022.    Allergies (verified) Patient has no known allergies.   History: Past Medical History:  Diagnosis Date   Arthritis    [place]ast cancer ([place]    Diverticulitis    Family history of breast cancer    GERD (gastroesophageal reflux disease)    Hyperlipidemia    Hypertension    Lymphedema    right arm   Personal history of chemotherapy    Personal history of radiation therapy    Pre-diabetes    Past Surgical  History:  Procedure Laterality Date   APPLICATION OF INTRAOPERATIVE CT SCAN N/A 11/16/2021   Procedure: APPLICATION OF INTRAOPERATIVE CT SCAN;  Surgeon: Meade Maw, MD;  Location: [facility] ORS;  Service: Neurosurgery;  Laterality: N/A;   BREAST BIOPSY Right 05/28/2018   [place] bx, INVASIVE MAMMARY CARCINOMA WITH LOBULAR FEATURES   BREAST LUMPECTOMY Right 06/12/2018   [place], lobular features   BREAST LUMPECTOMY WITH SENTINEL LYMPH NODE BIOPSY Right 06/12/2018   Procedure: RIGHT BREAST WIDE EXCISION WITH SENTINEL LYMPH NODE BX;  Surgeon: Robert Bellow, MD;  Location: [facility] ORS;  Service: General;  Laterality: Right;   [place] Right 10/30/2018   Procedure: IRRIGATION AND DEBRIDEMENT  RIGHT CHEST WALL WOUND;  Surgeon: Robert Bellow, MD;  Location: [facility] ORS;  Service: General;  Laterality: Right;   MASTECTOMY Right    MASTECTOMY WITH AXILLARY LYMPH NODE DISSECTION Right 09/20/2018   Procedure: MASTECTOMY WITH AXILLARY LYMPH NODE DISSECTION RIGHT;  Surgeon: Robert Bellow, MD;  Location: [facility] ORS;  Service: General;  Laterality: Right;   ovaraian cyst removal Right    Cyst only (NOT OVARY)   PORTACATH PLACEMENT Left 09/20/2018   Procedure: INSERTION PORT-A-CATH, LEFT;  Surgeon: Robert Bellow, MD;  Location: [facility] OR[facility];  Service: General;  Laterality: Left;   POSTERIOR CERVICAL FUSION/FORAMINOTOMY N/A 11/16/2021   Procedure: C1-2 POSTERIOR FUSION WITH ORIF (OPEN REDUCTION INTERNAL FIXATION) C2 FRACTURE ([place]);  Surgeon: Meade Maw, MD;  Location: [facility] OR[facility];  Service: Neurosurgery;  Laterality: N/A;   TOTAL KNEE ARTHROPLASTY Bilateral 05/05/2010   Family History  Problem Relation Age of Onset   Alcohol abuse Mother        deceased 10   Arthritis Mother    Diabetes Father    Breast cancer Maternal Aunt        dx 30s; deceased 90s   Breast cancer Maternal Aunt        dx 68s; deceased 34s   Arthritis Maternal Grandmother    Social History   Socioeconomic History   Marital status: Single    Spouse name: Not on file   Number of children: Not on file   Years of education: Not on file   Highest education level: Not on file  Occupational History   Occupation: retired  Tobacco Use   Smoking status: Never   Smokeless tobacco: Never  Vaping Use   Vaping Use: Never used  Substance and Sexual Activity   Alcohol use: No    Alcohol/week: 0.0 standard drinks of alcohol   Drug use: No   Sexual activity: Never  Other Topics Concern   Not on file  Social History Narrative   Retired    Lives by herself    Pets: None   Caffeine- Coffee 2 cups daily, no tea/soda      Social Determinants of Health   Financial Resource Strain: Low Risk   (05/22/2022)   Overall Financial Resource Strain (CARDIA)    Difficulty of Paying Living Expenses: Not hard at all  Food Insecurity: No Food Insecurity (05/22/2022)   Hunger Vital Sign    Worried About Running Out of Food in the Last Year: Never true    [place] in the Last Year: Never true  Transportation Needs: No Transportation Needs (05/22/2022)   PRAPARE - Hydrologist (Medical): No    Lack of Transportation (Non-Medical): No  Physical Activity:  Sufficiently Active (05/22/2022)   Exercise Vital Sign    Days of Exercise per Week: 5 days    Minutes of Exercise per Session: 30 min  Stress: No Stress Concern Present (05/22/2022)   [place]    Feeling of Stress : Not at all  Social Connections: Unknown (05/22/2022)   Social Connection and Isolation Panel [NHANES]    Frequency of Communication with Friends and Family: More than three times a week    Frequency of Social Gatherings with Friends and Family: More than three times a week    Attends Religious Services: Not on Advertising copywriter or Organizations: Not on file    Attends Archivist Meetings: Not on file    Marital Status: Not on file    Tobacco Counseling Counseling given: Not Answered   Clinical Intake:  Pre-visit preparation completed: Yes        Diabetes: No  How often do you need to have someone help you when you read instructions, pamphlets, or other written materials from your doctor or pharmacy?: 1 - Never    Interpreter Needed?: No    Activities of Daily Living    05/22/2022    1:45 PM 11/16/2021    6:37 PM  In your present state of health, do you have any difficulty performing the following activities:  Hearing? 0 0  Vision? 0 0  Difficulty concentrating or making decisions? 0 0  Walking or climbing stairs? 0 1  Dressing or bathing? 0 0  Doing errands, shopping? 1 0  Comment  Family Diplomatic Services operational officer and eating ? N   Using the Toilet? N   In the past six months, have you accidently leaked urine? Y   Comment Managed with medication and daily pad   Do you have problems with loss of bowel control? N   Managing your Medications? N   Managing your Finances? N   Housekeeping or managing your Housekeeping? N     Patient Care Team: Leone Haven, MD as PCP - General (Family Medicine) Sindy Guadeloupe, MD as Consulting Physician (Oncology) Rico Junker, RN as Registered Nurse Bary Castilla, Forest Gleason, MD as Consulting Physician (General Surgery) Noreene Filbert, MD as Referring Physician (Radiation Oncology)  Indicate any recent Medical Services you may have received from other than [facility] providers in the past year (date may be approximate).     Assessment:   This is a routine wellness examination for Theresa Reid.  Hearing/Vision screen Hearing Screening - Comments:: Patient is able to hear conversational tones without difficulty. No issues reported. Vision Screening - Comments:: Followed by [facility] Wears corrective lenses  They have seen their ophthalmologist in the last 12 months  Dietary issues and exercise activities discussed: Current Exercise Habits: Home exercise routine, Type of exercise: walking (physical therapy), Time (Minutes): 45, Frequency (Times/Week): 1, Weekly Exercise (Minutes/Week): 45, Intensity: Mild   Goals Addressed               This Visit's Progress     Patient Stated     Maintain Healthy Lifestyle (pt-stated)        Stay active Healthy diet Good water intake       Depression Screen    05/22/2022    1:30 PM 05/03/2022    1:43 PM 01/04/2022   11:06 AM 11/02/2021    2:17 PM 05/20/2021   11:31 AM 05/19/2020  11:45 AM 05/05/2020   10:30 AM  PHQ 2/9 Scores  PHQ - 2 Score 0 0 0 0 0 0 0    Fall Risk    05/22/2022    1:45 PM 05/03/2022    1:43 PM 01/04/2022   11:05 AM 11/02/2021    2:16 PM 05/20/2021   11:30  AM  Fall Risk   Falls in the past year? 0 0 0 0 0  Number falls in past yr: 0 0 0 0 0  Injury with Fall? 0 0 0 0   Risk for fall due to :  No Fall Risks No Fall Risks No Fall Risks   Follow up Falls evaluation completed;Falls prevention discussed Falls evaluation completed Falls evaluation completed Falls evaluation completed Falls evaluation completed    FALL RISK PREVENTION PERTAINING TO THE HOME: Home free of loose throw rugs in walkways, pet beds, electrical cords, etc? Yes  Adequate lighting in your home to reduce risk of falls? Yes   ASSISTIVE DEVICES UTILIZED TO PREVENT FALLS: Life alert? No  Use of a cane, walker or w/c? Yes  Grab bars in the bathroom? Yes  Shower chair or bench in shower? Yes  Elevated toilet seat or a handicapped toilet? Yes   TIMED UP AND GO: Was the test performed? Yes . Gait steady and fast with use of cane.  Cognitive Function:    05/02/2018   12:14 PM 04/25/2017    2:56 PM 02/11/2016    3:02 PM  MMSE - Mini Mental State Exam  Orientation to time '5 5 5  '$ Orientation to Place '5 5 5  '$ Registration '3 3 3  '$ Attention/ Calculation '5 5 5  '$ Recall '3 3 3  '$ Language- name 2 objects '2 2 2  '$ Language- repeat '1 1 1  '$ Language- follow 3 step command '3 3 3  '$ Language- read & follow direction '1 1 1  '$ Write a sentence '1 1 1  '$ Copy design '1 1 1  '$ Total score '30 30 30        '$ 05/22/2022    1:47 PM 05/07/2019   10:53 AM  6CIT Screen  What Year? 0 points 0 points  What month? 0 points 0 points  What time? 0 points 0 points  Count back from 20 0 points 0 points  Months in reverse 0 points 0 points  Repeat phrase 0 points 0 points  Total Score 0 points 0 points    Immunizations Immunization History  Administered Date(s) Administered   Fluad Quad(high Dose 65+) 01/06/2019, 01/04/2022   Influenza Split 12/28/2010   Influenza, High Dose Seasonal PF 01/22/2018   Influenza, Seasonal, Injecte, Preservative Fre 01/23/2012   Influenza,inj,Quad PF,6+ Mos 02/06/2013,  02/03/2014   Influenza,inj,quad, With Preservative 01/22/2017   Influenza-Unspecified 12/15/2014, 01/12/2016, 12/30/2016, 02/19/2020, 02/07/2021   PFIZER(Purple Top)SARS-COV-2 Vaccination 05/23/2019, 06/13/2019, 04/13/2020   Pfizer Covid-19 Vaccine Bivalent Booster 43yr & up 02/18/2021, 02/18/2022   Pneumococcal Conjugate-13 02/11/2016, 03/01/2017   Pneumococcal Polysaccharide-23 01/04/2010, 11/08/2012   Pneumococcal-Unspecified 01/22/2017   Tdap 01/22/2017   Zoster Recombinat (Shingrix) 03/01/2017, 02/18/2022   Zoster, Live 09/22/2008   Screening Tests Health Maintenance  Topic Date Due   COVID-19 Vaccine (6 - 2023-24 season) 06/07/2022 (Originally 04/15/2022)   Medicare Annual Wellness (AWV)  05/23/2023   DTaP/Tdap/Td (2 - Td or Tdap) 01/23/2027   Pneumonia Vaccine 82 Years old  Completed   INFLUENZA VACCINE  Completed   DEXA SCAN  Completed   Zoster Vaccines- Shingrix  Completed   HPV VACCINES  Aged Out   Health Maintenance There are no preventive care reminders to display for this patient.  Lung Cancer Screening: (Low Dose CT Chest recommended if Age 65-80 years, 30 pack-year currently smoking OR have quit w/in 15years.) does not qualify.   Hepatitis C Screening: does not qualify.  Vision Screening: Recommended annual ophthalmology exams for early detection of glaucoma and other disorders of the eye.  Dental Screening: Recommended annual dental exams for proper oral hygiene  Community Resource Referral / Chronic Care Management: CRR required this visit?  No   CCM required this visit?  No      Plan:     I have personally reviewed and noted the following in the patient's chart:   Medical and social history Use of alcohol, tobacco or illicit drugs  Current medications and supplements including opioid prescriptions. Patient is not currently taking opioid prescriptions. Functional ability and status Nutritional status Physical activity Advanced directives List  of other physicians Hospitalizations, surgeries, and ER visits in previous 12 months Vitals Screenings to include cognitive, depression, and falls Referrals and appointments  In addition, I have reviewed and discussed with patient certain preventive protocols, quality metrics, and best practice recommendations. A written personalized care plan for preventive services as well as general preventive health recommendations were provided to patient.     Leta Jungling, LPN   01/07/3845

## 2022-05-22 NOTE — Patient Instructions (Addendum)
Theresa Reid , Thank you for taking time to come for your Medicare Wellness Visit. I appreciate your ongoing commitment to your health goals. Please review the following plan we discussed and let me know if I can assist you in the future.   These are the goals we discussed:  Goals       Patient Stated     Maintain Healthy Lifestyle (pt-stated)      Stay active Healthy diet Good water intake        This is a list of the screening recommended for you and due dates:  Health Maintenance  Topic Date Due   COVID-19 Vaccine (6 - 2023-24 season) 06/07/2022*   Medicare Annual Wellness Visit  05/23/2023   DTaP/Tdap/Td vaccine (2 - Td or Tdap) 01/23/2027   Pneumonia Vaccine  Completed   Flu Shot  Completed   DEXA scan (bone density measurement)  Completed   Zoster (Shingles) Vaccine  Completed   HPV Vaccine  Aged Out  *Topic was postponed. The date shown is not the original due date.    Advanced directives: on file  Conditions/risks identified: none new  Next appointment: Follow up in one year for your annual wellness visit    Preventive Care 65 Years and Older, Female Preventive care refers to lifestyle choices and visits with your health care provider that can promote health and wellness. What does preventive care include? A yearly physical exam. This is also called an annual well check. Dental exams once or twice a year. Routine eye exams. Ask your health care provider how often you should have your eyes checked. Personal lifestyle choices, including: Daily care of your teeth and gums. Regular physical activity. Eating a healthy diet. Avoiding tobacco and drug use. Limiting alcohol use. Practicing safe sex. Taking low-dose aspirin every day. Taking vitamin and mineral supplements as recommended by your health care provider. What happens during an annual well check? The services and screenings done by your health care provider during your annual well check will depend on  your age, overall health, lifestyle risk factors, and family history of disease. Counseling  Your health care provider may ask you questions about your: Alcohol use. Tobacco use. Drug use. Emotional well-being. Home and relationship well-being. Sexual activity. Eating habits. History of falls. Memory and ability to understand (cognition). Work and work Statistician. Reproductive health. Screening  You may have the following tests or measurements: Height, weight, and BMI. Blood pressure. Lipid and cholesterol levels. These may be checked every 5 years, or more frequently if you are over 85 years old. Skin check. Lung cancer screening. You may have this screening every year starting at age 39 if you have a 30-pack-year history of smoking and currently smoke or have quit within the past 15 years. Fecal occult blood test (FOBT) of the stool. You may have this test every year starting at age 29. Flexible sigmoidoscopy or colonoscopy. You may have a sigmoidoscopy every 5 years or a colonoscopy every 10 years starting at age 28. Hepatitis C blood test. Hepatitis B blood test. Sexually transmitted disease (STD) testing. Diabetes screening. This is done by checking your blood sugar (glucose) after you have not eaten for a while (fasting). You may have this done every 1-3 years. Bone density scan. This is done to screen for osteoporosis. You may have this done starting at age 67. Mammogram. This may be done every 1-2 years. Talk to your health care provider about how often you should have regular mammograms. Talk  with your health care provider about your test results, treatment options, and if necessary, the need for more tests. Vaccines  Your health care provider may recommend certain vaccines, such as: Influenza vaccine. This is recommended every year. Tetanus, diphtheria, and acellular pertussis (Tdap, Td) vaccine. You may need a Td booster every 10 years. Zoster vaccine. You may need this  after age 23. Pneumococcal 13-valent conjugate (PCV13) vaccine. One dose is recommended after age 47. Pneumococcal polysaccharide (PPSV23) vaccine. One dose is recommended after age 6. Talk to your health care provider about which screenings and vaccines you need and how often you need them. This information is not intended to replace advice given to you by your health care provider. Make sure you discuss any questions you have with your health care provider. Document Released: 05/07/2015 Document Revised: 12/29/2015 Document Reviewed: 02/09/2015 Elsevier Interactive Patient Education  2017 [place] Prevention in the Home Falls can cause injuries. They can happen to people of all ages. There are many things you can do to make your home safe and to help prevent falls. What can I do on the outside of my home? Regularly fix the edges of walkways and driveways and fix any cracks. Remove anything that might make you trip as you walk through a door, such as a raised step or threshold. Trim any bushes or trees on the path to your home. Use bright outdoor lighting. Clear any walking paths of anything that might make someone trip, such as rocks or tools. Regularly check to see if handrails are loose or broken. Make sure that both sides of any steps have handrails. Any raised decks and porches should have guardrails on the edges. Have any leaves, snow, or ice cleared regularly. Use sand or salt on walking paths during winter. Clean up any spills in your garage right away. This includes oil or grease spills. What can I do in the bathroom? Use night lights. Install grab bars by the toilet and in the tub and shower. Do not use towel bars as grab bars. Use non-skid mats or decals in the tub or shower. If you need to sit down in the shower, use a plastic, non-slip stool. Keep the floor dry. Clean up any water that spills on the floor as soon as it happens. Remove soap buildup in the tub or  shower regularly. Attach bath mats securely with double-sided non-slip rug tape. Do not have throw rugs and other things on the floor that can make you trip. What can I do in the bedroom? Use night lights. Make sure that you have a light by your bed that is easy to reach. Do not use any sheets or blankets that are too big for your bed. They should not hang down onto the floor. Have a firm chair that has side arms. You can use this for support while you get dressed. Do not have throw rugs and other things on the floor that can make you trip. What can I do in the kitchen? Clean up any spills right away. Avoid walking on wet floors. Keep items that you use a lot in easy-to-reach places. If you need to reach something above you, use a strong step stool that has a grab bar. Keep electrical cords out of the way. Do not use floor polish or wax that makes floors slippery. If you must use wax, use non-skid floor wax. Do not have throw rugs and other things on the floor that can make you  trip. What can I do with my stairs? Do not leave any items on the stairs. Make sure that there are handrails on both sides of the stairs and use them. Fix handrails that are broken or loose. Make sure that handrails are as long as the stairways. Check any carpeting to make sure that it is firmly attached to the stairs. Fix any carpet that is loose or worn. Avoid having throw rugs at the top or bottom of the stairs. If you do have throw rugs, attach them to the floor with carpet tape. Make sure that you have a light switch at the top of the stairs and the bottom of the stairs. If you do not have them, ask someone to add them for you. What else can I do to help prevent falls? Wear shoes that: Do not have high heels. Have rubber bottoms. Are comfortable and fit you well. Are closed at the toe. Do not wear sandals. If you use a stepladder: Make sure that it is fully opened. Do not climb a closed stepladder. Make  sure that both sides of the stepladder are locked into place. Ask someone to hold it for you, if possible. Clearly mark and make sure that you can see: Any grab bars or handrails. First and last steps. Where the edge of each step is. Use tools that help you move around (mobility aids) if they are needed. These include: Canes. Walkers. Scooters. Crutches. Turn on the lights when you go into a dark area. Replace any light bulbs as soon as they burn out. Set up your furniture so you have a clear path. Avoid moving your furniture around. If any of your floors are uneven, fix them. If there are any pets around you, be aware of where they are. Review your medicines with your doctor. Some medicines can make you feel dizzy. This can increase your chance of falling. Ask your doctor what other things that you can do to help prevent falls. This information is not intended to replace advice given to you by your health care provider. Make sure you discuss any questions you have with your health care provider. Document Released: 02/04/2009 Document Revised: 09/16/2015 Document Reviewed: 05/15/2014 Elsevier Interactive Patient Education  2017 [employer].

## 2022-05-31 DIAGNOSIS — M542 Cervicalgia: Secondary | ICD-10-CM | POA: Diagnosis not present

## 2022-06-19 ENCOUNTER — Ambulatory Visit
Admission: RE | Admit: 2022-06-19 | Discharge: 2022-06-19 | Disposition: A | Discharge status: Home / Self Care | Payer: Medicare Other | Source: Ambulatory Visit | Attending: Oncology | Admitting: Oncology

## 2022-06-19 DIAGNOSIS — Z08 Encounter for follow-up examination after completed treatment for malignant neoplasm: Secondary | ICD-10-CM | POA: Insufficient documentation

## 2022-06-19 DIAGNOSIS — Z780 Asymptomatic menopausal state: Secondary | ICD-10-CM | POA: Diagnosis not present

## 2022-06-19 DIAGNOSIS — Z853 Personal history of malignant neoplasm of breast: Secondary | ICD-10-CM | POA: Insufficient documentation

## 2022-06-23 ENCOUNTER — Telehealth: Payer: Self-pay | Admitting: *Deleted

## 2022-06-23 ENCOUNTER — Encounter: Payer: Self-pay | Admitting: Oncology

## 2022-06-23 ENCOUNTER — Inpatient Hospital Stay: Payer: Medicare Other

## 2022-06-23 ENCOUNTER — Inpatient Hospital Stay: Payer: Medicare Other | Attending: Oncology | Admitting: Oncology

## 2022-06-23 VITALS — BP 164/79 | HR 86 | Temp 98.6°F | Ht 65.0 in | Wt 192.3 lb

## 2022-06-23 DIAGNOSIS — C50911 Malignant neoplasm of unspecified site of right female breast: Secondary | ICD-10-CM

## 2022-06-23 DIAGNOSIS — Z9011 Acquired absence of right breast and nipple: Secondary | ICD-10-CM | POA: Insufficient documentation

## 2022-06-23 DIAGNOSIS — Z9221 Personal history of antineoplastic chemotherapy: Secondary | ICD-10-CM | POA: Diagnosis not present

## 2022-06-23 DIAGNOSIS — Z79899 Other long term (current) drug therapy: Secondary | ICD-10-CM | POA: Insufficient documentation

## 2022-06-23 DIAGNOSIS — Z7983 Long term (current) use of bisphosphonates: Secondary | ICD-10-CM | POA: Diagnosis not present

## 2022-06-23 DIAGNOSIS — Z7952 Long term (current) use of systemic steroids: Secondary | ICD-10-CM | POA: Diagnosis not present

## 2022-06-23 DIAGNOSIS — Z08 Encounter for follow-up examination after completed treatment for malignant neoplasm: Secondary | ICD-10-CM

## 2022-06-23 DIAGNOSIS — I10 Essential (primary) hypertension: Secondary | ICD-10-CM | POA: Diagnosis not present

## 2022-06-23 DIAGNOSIS — Z923 Personal history of irradiation: Secondary | ICD-10-CM | POA: Diagnosis not present

## 2022-06-23 DIAGNOSIS — Z5181 Encounter for therapeutic drug level monitoring: Secondary | ICD-10-CM | POA: Diagnosis not present

## 2022-06-23 DIAGNOSIS — Z853 Personal history of malignant neoplasm of breast: Secondary | ICD-10-CM

## 2022-06-23 DIAGNOSIS — Z170 Estrogen receptor positive status [ER+]: Secondary | ICD-10-CM | POA: Diagnosis not present

## 2022-06-23 DIAGNOSIS — Z79811 Long term (current) use of aromatase inhibitors: Secondary | ICD-10-CM | POA: Insufficient documentation

## 2022-06-23 DIAGNOSIS — C773 Secondary and unspecified malignant neoplasm of axilla and upper limb lymph nodes: Secondary | ICD-10-CM | POA: Insufficient documentation

## 2022-06-23 LAB — COMPREHENSIVE METABOLIC PANEL
ALT: 15 U/L (ref 0–44)
AST: 20 U/L (ref 15–41)
Albumin: 3.7 g/dL (ref 3.5–5.0)
Alkaline Phosphatase: 91 U/L (ref 38–126)
Anion gap: 9 (ref 5–15)
BUN: 13 mg/dL (ref 8–23)
CO2: 23 mmol/L (ref 22–32)
Calcium: 8.9 mg/dL (ref 8.9–10.3)
Chloride: 104 mmol/L (ref 98–111)
Creatinine, Ser: 0.54 mg/dL (ref 0.44–1.00)
GFR, Estimated: >60 mL/min (ref >60)
Glucose, Bld: 138 mg/dL — ABNORMAL HIGH (ref 70–99)
Potassium: 3.5 mmol/L (ref 3.5–5.1)
Sodium: 136 mmol/L (ref 135–145)
Total Bilirubin: 0.5 mg/dL (ref 0.3–1.2)
Total Protein: 6.8 g/dL (ref 6.5–8.1)

## 2022-06-23 MED ORDER — SODIUM CHLORIDE 0.9% FLUSH
10.0000 mL | INTRAVENOUS | Status: DC | PRN
  Administered 2022-06-23: 10 mL via INTRAVENOUS
  Filled 2022-06-23: qty 10

## 2022-06-23 MED ORDER — ZOLEDRONIC ACID 4 MG/100ML IV SOLN
4.0000 mg | Freq: Once | INTRAVENOUS | Status: AC
  Administered 2022-06-23: 4 mg via INTRAVENOUS

## 2022-06-23 MED ORDER — ALTEPLASE 2 MG IJ SOLR
2.0000 mg | Freq: Once | INTRAMUSCULAR | Status: AC | PRN
  Administered 2022-06-23: 2 mg
  Filled 2022-06-23: qty 2

## 2022-06-23 MED ORDER — SODIUM CHLORIDE 0.9 % IV SOLN
INTRAVENOUS | Status: DC | PRN
  Filled 2022-06-23: qty 250

## 2022-06-23 MED ORDER — SODIUM CHLORIDE 0.9% FLUSH
10.0000 mL | Freq: Once | INTRAVENOUS | Status: DC | PRN
  Filled 2022-06-23: qty 10

## 2022-06-23 NOTE — Progress Notes (Signed)
No concerns for the provider today.

## 2022-06-23 NOTE — Telephone Encounter (Signed)
Dr Bary Castilla originally placed the portacath insertion but he is retired and we have requested to see if Dr. Peyton Najjar or Dr. Lysle Pearl to remove port in next few weeks. Faxed the request and transmission went through.

## 2022-06-25 ENCOUNTER — Encounter: Payer: Self-pay | Admitting: Oncology

## 2022-06-25 NOTE — Progress Notes (Signed)
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  Patient underwent lumpectomy and sentinel lymph node biopsy in February 2020.  Pathology showed invasive lobular carcinoma with positive medial margin which was reexcised with still positive.  Tumor was 24 mm, grade 2 ER PR positive and HER-2/neu negative.  One sentinel lymph node that was excised was positive for macro met of 8 mm.  No extranodal extension was present.  MammaPrint came back as high risk with an average 10-year risk of untreated disease is 29%.  Patient then underwent a bilateral MRI which showed additional suspicious non-mass enhancement along the inferior margin of the biopsy cavity extending 2 cm highly suspicious for continuous disease and another highly suspicious linear non-mass enhancement at the base of the nipple concerning for multifocal multicentric disease.  Patient underwent right mastectomy and  axillary lymph node dissection on 09/20/2018.  There was no residual invasive mammary carcinoma within the breast.  However 15 out of 20 lymph nodes were involved with metastatic carcinoma measuring up to 6 mm and remaining 5 lymph nodes with isolated tumor cells.  Pathologic stage was [id number]   Despite having surgery in May 2020 which was a second surgery-patient has not been able to start adjuvant chemotherapy.  Her mastectomy was complicated by flap necrosis requiring debridement antibiotics and she had to be taken back to the OR for the same. Given her age and postmastectomy complications and delayed wound healing-weekly Taxol x12 cycles was chosen as the regimen for her adjuvant chemotherapy which she completed on 02/25/2019.   Patient started Arimidex in December 2020 and also received dental clearance to start Zometa.  She completed adjuvant radiation treatment     Interval history- she is doing well for her age. Tolerating arimidex well without significant side effects. Denies  any breast concerns  ECOG PS- 2 Pain scale- 2   Review of systems- Review of Systems  Constitutional:  Negative for chills, fever, malaise/fatigue and weight loss.  HENT:  Negative for congestion, ear discharge and nosebleeds.   Eyes:  Negative for blurred vision.  Respiratory:  Negative for cough, hemoptysis, sputum production, shortness of breath and wheezing.   Cardiovascular:  Negative for chest pain, palpitations, orthopnea and claudication.  Gastrointestinal:  Negative for abdominal pain, blood in stool, constipation, diarrhea, heartburn, melena, nausea and vomiting.  Genitourinary:  Negative for dysuria, flank pain, frequency, hematuria and urgency.  Musculoskeletal:  Negative for back pain, joint pain and myalgias.  Skin:  Negative for rash.  Neurological:  Negative for dizziness, tingling, focal weakness, seizures, weakness  and headaches.  Endo/Heme/Allergies:  Does not bruise/bleed easily.   Psychiatric/Behavioral:  Negative for depression and suicidal ideas. The patient does not have insomnia.       No Known Allergies   Past Medical History:  Diagnosis Date   Arthritis    Breast cancer ([place])    Diverticulitis    Family history of breast cancer    GERD (gastroesophageal reflux disease)    Hyperlipidemia    Hypertension    Lymphedema    right arm   Personal history of chemotherapy    Personal history of radiation therapy    Pre-diabetes      Past Surgical History:  Procedure Laterality Date   APPLICATION OF INTRAOPERATIVE CT SCAN N/A 11/16/2021   Procedure: APPLICATION OF INTRAOPERATIVE CT SCAN;  Surgeon: Meade Maw, MD;  Location: [facility] ORS;  Service: Neurosurgery;  Laterality: N/A;   BREAST BIOPSY Right 05/28/2018   [place] bx, INVASIVE MAMMARY CARCINOMA WITH LOBULAR FEATURES   BREAST LUMPECTOMY Right 06/12/2018   [place], lobular features   BREAST LUMPECTOMY WITH SENTINEL LYMPH NODE BIOPSY Right 06/12/2018   Procedure: RIGHT BREAST WIDE EXCISION WITH SENTINEL LYMPH NODE BX;  Surgeon: Robert Bellow, MD;  Location: [facility] ORS;  Service: General;  Laterality: Right;   [place] Right 10/30/2018   Procedure: IRRIGATION AND DEBRIDEMENT RIGHT CHEST WALL WOUND;  Surgeon: Robert Bellow, MD;  Location: [facility] ORS;  Service: General;  Laterality: Right;   MASTECTOMY Right    MASTECTOMY WITH AXILLARY LYMPH NODE DISSECTION Right 09/20/2018   Procedure: MASTECTOMY WITH AXILLARY LYMPH NODE DISSECTION RIGHT;  Surgeon: Robert Bellow, MD;  Location: [facility] ORS;  Service: General;  Laterality: Right;   ovaraian cyst removal Right    Cyst only (NOT OVARY)   PORTACATH PLACEMENT Left 09/20/2018   Procedure: INSERTION PORT-A-CATH, LEFT;  Surgeon: Robert Bellow, MD;  Location: [facility] ORS;  Service: General;  Laterality: Left;   POSTERIOR CERVICAL FUSION/FORAMINOTOMY N/A 11/16/2021   Procedure: C1-2  POSTERIOR FUSION WITH ORIF (OPEN REDUCTION INTERNAL FIXATION) C2 FRACTURE ([place]);  Surgeon: Meade Maw, MD;  Location: [facility] ORS;  Service: Neurosurgery;  Laterality: N/A;   TOTAL KNEE ARTHROPLASTY Bilateral 05/05/2010    Social History   Socioeconomic History   Marital status: Single    Spouse name: Not on file   Number of children: Not on file   Years of education: Not on file   Highest education level: Not on file  Occupational History   Occupation: retired  Tobacco Use   Smoking status: Never   Smokeless tobacco: Never  Vaping Use   Vaping Use: Never used  Substance and Sexual Activity   Alcohol use: No    Alcohol/week: 0.0 standard drinks of alcohol   Drug use: No   Sexual activity: Never  Other Topics Concern   Not on file  Social History Narrative   Retired    Lives by herself    Pets: None   Caffeine- Coffee 2 cups daily, no tea/soda      Social Determinants of Health   Financial Resource Strain: Low Risk  (05/22/2022)   Overall Financial Resource Strain (CARDIA)    Difficulty of Paying Living Expenses: Not hard at all  Food Insecurity: No Food Insecurity (05/22/2022)   Hunger Vital Sign    Worried About Running Out of Food in the Last Year: Never true    Ran Out of  Food in the Last Year: Never true  Transportation Needs: No Transportation Needs (05/22/2022)   PRAPARE - Hydrologist (Medical): No    Lack of Transportation (Non-Medical): No  Physical Activity: Sufficiently Active (05/22/2022)   Exercise Vital Sign    Days of Exercise per Week: 5 days    Minutes of Exercise per Session: 30 min  Stress: No Stress Concern Present (05/22/2022)   [place]    Feeling of Stress : Not at all  Social Connections: Unknown (05/22/2022)   Social Connection and Isolation Panel [NHANES]    Frequency of Communication with Friends and Family: More than three  times a week    Frequency of Social Gatherings with Friends and Family: More than three times a week    Attends Religious Services: Not on file    Active Member of Green or Organizations: Not on file    Attends Archivist Meetings: Not on file    Marital Status: Not on file  Intimate Partner Violence: Not At Risk (05/22/2022)   Humiliation, Afraid, Rape, and Kick questionnaire    Fear of Current or Ex-Partner: No    Emotionally Abused: No    Physically Abused: No    Sexually Abused: No    Family History  Problem Relation Age of Onset   Alcohol abuse Mother        deceased 67   Arthritis Mother    Diabetes Father    Breast cancer Maternal Aunt        dx 15s; deceased 39s   Breast cancer Maternal Aunt        dx 3s; deceased 77s   Arthritis Maternal Grandmother      Current Outpatient Medications:    acetaminophen (TYLENOL) 325 MG tablet, Take 2 tablets (650 mg total) by mouth every 6 (six) hours as needed for mild pain (or Fever >/= 101)., Disp: , Rfl:    amLODipine (NORVASC) 5 MG tablet, TAKE 1 TABLET BY MOUTH DAILY, Disp: 100 tablet, Rfl: 2   anastrozole (ARIMIDEX) 1 MG tablet, TAKE 1 TABLET BY MOUTH  DAILY DO NOT START UNTIL  COMPLETED RADIATION IN JAN  2021 (Patient taking differently: Take 1 mg by mouth every morning.), Disp: 90 tablet, Rfl: 3   atorvastatin (LIPITOR) 80 MG tablet, TAKE 1 TABLET BY MOUTH ONCE  DAILY, Disp: 100 tablet, Rfl: 2   CALCIUM-VITAMIN D PO, Take 1 tablet by mouth daily. , Disp: , Rfl:    ketoconazole (NIZORAL) 2 % shampoo, Apply 1 application. topically 2 (two) times a week. (Patient taking differently: Apply 1 application  topically once a week.), Disp: 120 mL, Rfl: 6   Multiple Vitamins tablet, Take 1 tablet by mouth daily. , Disp: , Rfl:    omeprazole (PRILOSEC) 20 MG capsule, TAKE 1 CAPSULE BY MOUTH DAILY (Patient taking differently: 20 mg every morning.), Disp: 100 capsule, Rfl: 2   oxybutynin (DITROPAN) 5 MG tablet, TAKE 1 TABLET BY  MOUTH TWICE  DAILY, Disp: 200 tablet, Rfl: 2   predniSONE (DELTASONE) 5 MG tablet, TAKE 1 TO 2 TABLETS BY MOUTH  DAILY AS NEEDED FOR ARTHRITIS  FLARE, Disp: 90 tablet, Rfl: 0  Physical exam:  Vitals:   06/23/22 1430 06/23/22 1445  BP: (!) 163/65 (!) 164/79  Pulse: 86   Temp: 98.6 F (37 C)   TempSrc: Tympanic   SpO2: 100%   Weight: 192 lb 4.8 oz (87.2 kg)  Height: '5\' 5"'$  (1.651 m)    Physical Exam Cardiovascular:     Rate and Rhythm: Normal rate and regular rhythm.     Heart sounds: Normal heart sounds.  Pulmonary:     Effort: Pulmonary effort is normal.     Breath sounds: Normal breath sounds.  Abdominal:     General: Bowel sounds are normal.     Palpations: Abdomen is soft.  Musculoskeletal:     Comments: Right lymphedema sleeve in place  Skin:    General: Skin is warm and dry.  Neurological:     Mental Status: She is alert and oriented to person, place, and time.     Breast exam: Patient is s/p right mastectomy without reconstruction. No evidence of chest wall recurrence. No palpable masses in left breast. No palpable axillary adenopathy     Latest Ref Rng & Units 06/23/2022    1:59 PM  CMP  Glucose 70 - 99 mg/dL 138   BUN 8 - 23 mg/dL 13   Creatinine 0.44 - 1.00 mg/dL 0.54   Sodium 135 - 145 mmol/L 136   Potassium 3.5 - 5.1 mmol/L 3.5   Chloride 98 - 111 mmol/L 104   CO2 22 - 32 mmol/L 23   Calcium 8.9 - 10.3 mg/dL 8.9   Total Protein 6.5 - 8.1 g/dL 6.8   Total Bilirubin 0.3 - 1.2 mg/dL 0.5   Alkaline Phos 38 - 126 U/L 91   AST 15 - 41 U/L 20   ALT 0 - 44 U/L 15       Latest Ref Rng & Units 12/20/2021    1:11 PM  CBC  WBC 4.0 - 10.5 K/uL 9.4   Hemoglobin 12.0 - 15.0 g/dL 13.5   Hematocrit 36.0 - 46.0 % 40.8   Platelets 150 - 400 K/uL 219     No images are attached to the encounter.  DG Bone Density  Result Date: 06/19/2022 EXAM: DUAL X-RAY ABSORPTIOMETRY (DXA) FOR BONE MINERAL DENSITY IMPRESSION: Your patient Kennidy Wyszynski completed a BMD test on  06/19/2022 using the [place] (software version: 14.10) manufactured by [employer]. The following summarizes the results of our evaluation. Technologist: [facility] PATIENT BIOGRAPHICAL: Name: Almarie, Glazer Patient ID: [id number] Birth Date: July 15, 1940 Height: 64.0 in. Gender: Female Exam Date: 06/19/2022 Weight: 191.5 lbs. Indications: Advanced Age, bilateral knee replacements, Caucasian, High Risk Meds, History of Breast Cancer, History of Chemo, History of Radiation, Osteoarthritis, Postmenopausal Fractures: Treatments: Anastrozole, Calcium, Multi-Vitamin, Vitamin D DENSITOMETRY RESULTS: Site         Region     Measured Date Measured Age WHO Classification Young Adult T-score BMD         %Change vs. Previous Significant Change (*) DualFemur Neck Left 4/19/2020 83.1 Normal -1.0 0.899 g/cm2 -2.8% - DualFemur Neck Left 02/27/2019 79.8 Normal -0.8 0.925 g/cm2 -6.4% Yes DualFemur Neck Left 11/08/2016 77.5 Normal -0.4 0.988 g/cm2 - - DualFemur Total Mean 06/19/2022 83.1 Normal -0.2 0.986 g/cm2 2.8% Yes DualFemur Total Mean 02/27/2019 79.8 Normal -0.4 0.959 g/cm2 -5.7% Yes DualFemur Total Mean 11/08/2016 77.5 Normal 0.1 1.017 g/cm2 - - Left Forearm Radius 33% 06/19/2022 83.1 Normal -0.6 0.823 g/cm2 0.7% - Left Forearm Radius 33% 02/27/2019 79.8 Normal -0.7 0.817 g/cm2 -5.3% Yes Left Forearm Radius 33% 11/08/2016 77.5 Normal -0.2 0.863 g/cm2 - - ASSESSMENT: The BMD measured at Femur Neck Left is 0.899 g/cm2 with a T-score of -1.0. This patient is considered normal according to [place] [facility])  criteria. The scan quality is good. Compared with prior study, there has been significant increase in the total hip. Lumbar spine evaluation was excluded due to advanced degenerative changes on previously evaluation. World Pharmacologist [facility]) criteria for post-menopausal, Caucasian Women Normal:                   T-score at or above -1 SD Osteopenia/low bone mass: T-score between -1 and  -2.5 SD Osteoporosis:             T-score at or below -2.5 SD RECOMMENDATIONS: 1. All patients should optimize calcium and vitamin D intake. 2. Consider FDA-approved medical therapies in postmenopausal women and men aged 74 years and older, based on the following: a. A hip or vertebral(clinical or morphometric) fracture b. T-score < -2.5 at the femoral neck or spine after appropriate evaluation to exclude secondary causes c. Low bone mass (T-score between -1.0 and -2.5 at the femoral neck or spine) and a 10-year probability of a hip fracture > 3% or a 10-year probability of a major osteoporosis-related fracture > 20% based on the [place]-adapted WHO algorithm 3. Clinician judgment and/or patient preferences may indicate treatment for people with 10-year fracture probabilities above or below these levels FOLLOW-UP: People with diagnosed cases of osteoporosis or at high risk for fracture should have regular bone mineral density tests. For patients eligible for Medicare, routine testing is allowed once every 2 years. The testing frequency can be increased to one year for patients who have rapidly progressing disease, those who are receiving or discontinuing medical therapy to restore bone mass, or have additional risk factors. I have reviewed this report, and agree with the above findings. Mark A. Thornton Papas, M.D. [facility] Electronically Signed   By: Lavonia Dana M.D.   On: 06/19/2022 14:22     Assessment and plan- Patient is a 82 y.o. female with history of right breast cancer ER/PR positive HER2 negative stage III s/p mastectomy adjuvant chemotherapy and radiation. She is here for routine follow up and to receive zometa  Calcium levels are acceptable to proceed with Zometa today which would be her last dose.  She has completed 3 years of adjuvant Zometa with this.  With regards to her breast cancer she had a high risk of breast cancer with multiple positive lymph nodes and I would like to keep her on  Arimidex for 10 years if she can tolerate it based on her overall quality of life.  Clinically otherwise she is doing well with no concerning signs and symptoms of recurrence based on today's exam.  I will see her back in 6 months no labs.  Her mammograms will be coordinated by Dr. Peyton Najjar or Dr. Lysle Pearl.  Her port can be taken out at this time   Visit Diagnosis 1. Encounter for follow-up surveillance of breast cancer   2. Visit for monitoring Arimidex therapy      Dr. Randa Evens, MD, MPH [facility] [id number] 06/25/2022 10:08 AM

## 2022-06-26 ENCOUNTER — Ambulatory Visit
Admission: RE | Admit: 2022-06-26 | Discharge: 2022-06-26 | Disposition: A | Discharge status: Home / Self Care | Payer: Medicare Other | Source: Ambulatory Visit | Attending: Family Medicine | Admitting: Family Medicine

## 2022-06-26 DIAGNOSIS — Z1231 Encounter for screening mammogram for malignant neoplasm of breast: Secondary | ICD-10-CM | POA: Diagnosis not present

## 2022-07-05 ENCOUNTER — Ambulatory Visit: Payer: Medicare Other | Admitting: Family Medicine

## 2022-07-06 DIAGNOSIS — Z95828 Presence of other vascular implants and grafts: Secondary | ICD-10-CM | POA: Diagnosis not present

## 2022-07-13 ENCOUNTER — Other Ambulatory Visit: Payer: Self-pay | Admitting: Oncology

## 2022-07-18 ENCOUNTER — Ambulatory Visit (INDEPENDENT_AMBULATORY_CARE_PROVIDER_SITE_OTHER): Payer: Medicare Other | Admitting: Family Medicine

## 2022-07-18 ENCOUNTER — Encounter: Payer: Self-pay | Admitting: Family Medicine

## 2022-07-18 VITALS — BP 142/86 | HR 91 | Temp 97.9°F | Ht 65.0 in | Wt 191.0 lb

## 2022-07-18 DIAGNOSIS — I10 Essential (primary) hypertension: Secondary | ICD-10-CM

## 2022-07-18 DIAGNOSIS — R002 Palpitations: Secondary | ICD-10-CM | POA: Diagnosis not present

## 2022-07-18 LAB — BASIC METABOLIC PANEL
BUN: 10 mg/dL (ref 6–23)
CO2: 28 mEq/L (ref 19–32)
Calcium: 9.2 mg/dL (ref 8.4–10.5)
Chloride: 103 mEq/L (ref 96–112)
Creatinine, Ser: 0.64 mg/dL (ref 0.40–1.20)
GFR: 81.83 mL/min (ref >60.00)
Glucose, Bld: 88 mg/dL (ref 70–99)
Potassium: 3.8 mEq/L (ref 3.5–5.1)
Sodium: 141 mEq/L (ref 135–145)

## 2022-07-18 LAB — CBC
HCT: 42.8 % (ref 36.0–46.0)
Hemoglobin: 14 g/dL (ref 12.0–15.0)
MCHC: 32.8 g/dL (ref 30.0–36.0)
MCV: 88.4 fl (ref 78.0–100.0)
Platelets: 256 10*3/uL (ref 150.0–400.0)
RBC: 4.84 Mil/uL (ref 3.87–5.11)
RDW: 15 % (ref 11.5–15.5)
WBC: 7.7 10*3/uL (ref 4.0–10.5)

## 2022-07-18 LAB — TSH: TSH: 1.54 u[IU]/mL (ref 0.35–5.50)

## 2022-07-18 NOTE — Assessment & Plan Note (Signed)
Recent issue.  Undetermined cause.  EKG performed today.  Lab work as outlined to look for an underlying cause.  Discussed potential for doing a ZIO monitor if her labs are unremarkable.

## 2022-07-18 NOTE — Progress Notes (Signed)
Tommi Rumps, MD Phone: [phone]  Theresa Reid is a 82 y.o. female who presents today for f/u.  HYPERTENSION Disease Monitoring Home BP Monitoring [id number] systolic at home checking 3x/week, had [id number] systolic at several doctors offices Chest pain- no    Dyspnea- no Medications Compliance-  taking amlodipine.  Edema- chronic and unchanged BMET    Component Value Date/Time   NA 136 06/23/2022 1359   NA 142 07/16/2015 0000   NA 138 11/08/2012 0548   K 3.5 06/23/2022 1359   K 3.3 (L) 11/08/2012 0548   CL 104 06/23/2022 1359   CL 103 11/08/2012 0548   CO2 23 06/23/2022 1359   CO2 28 11/08/2012 0548   GLUCOSE 138 (H) 06/23/2022 1359   GLUCOSE 97 11/08/2012 0548   BUN 13 06/23/2022 1359   BUN 10 07/16/2015 0000   BUN 6 (L) 11/08/2012 0548   CREATININE 0.54 06/23/2022 1359   CREATININE 0.60 11/08/2012 0548   CALCIUM 8.9 06/23/2022 1359   CALCIUM 8.7 11/08/2012 0548   GFRNONAA >60 06/23/2022 1359   GFRNONAA >60 11/08/2012 0548   GFRAA >60 12/08/2019 1247   GFRAA >60 11/08/2012 0548   Palpitations: Patient notes at times she feels palpitations in the morning when she wakes up.  These occur intermittently.  They have been going on for a couple of weeks.  They last for few minutes and resolve on their own.  She denies anxiety.  Social History   Tobacco Use  Smoking Status Never  Smokeless Tobacco Never    Current Outpatient Medications on File Prior to Visit  Medication Sig Dispense Refill   acetaminophen (TYLENOL) 325 MG tablet Take 2 tablets (650 mg total) by mouth every 6 (six) hours as needed for mild pain (or Fever >/= 101).     amLODipine (NORVASC) 5 MG tablet TAKE 1 TABLET BY MOUTH DAILY 100 tablet 2   anastrozole (ARIMIDEX) 1 MG tablet TAKE 1 TABLET BY MOUTH DAILY 60 tablet 5   atorvastatin (LIPITOR) 80 MG tablet TAKE 1 TABLET BY MOUTH ONCE  DAILY 100 tablet 2   CALCIUM-VITAMIN D PO Take 1 tablet by mouth daily.      ketoconazole (NIZORAL) 2 % shampoo Apply  1 application. topically 2 (two) times a week. (Patient taking differently: Apply 1 application  topically once a week.) 120 mL 6   Multiple Vitamins tablet Take 1 tablet by mouth daily.      omeprazole (PRILOSEC) 20 MG capsule TAKE 1 CAPSULE BY MOUTH DAILY (Patient taking differently: 20 mg every morning.) 100 capsule 2   oxybutynin (DITROPAN) 5 MG tablet TAKE 1 TABLET BY MOUTH TWICE  DAILY 200 tablet 2   predniSONE (DELTASONE) 5 MG tablet TAKE 1 TO 2 TABLETS BY MOUTH  DAILY AS NEEDED FOR ARTHRITIS  FLARE 90 tablet 0   [DISCONTINUED] prochlorperazine (COMPAZINE) 10 MG tablet Take 1 tablet (10 mg total) by mouth every 6 (six) hours as needed (Nausea or vomiting). 30 tablet 1   No current facility-administered medications on file prior to visit.     ROS see history of present illness  Objective  Physical Exam Vitals:   07/18/22 1120  BP: (!) 158/86  Pulse: 91  Temp: 97.9 F (36.6 C)  SpO2: 97%    BP Readings from Last 3 Encounters:  07/18/22 (!) 158/86  06/23/22 (!) 164/79  05/22/22 132/74   Wt Readings from Last 3 Encounters:  07/18/22 191 lb (86.6 kg)  06/23/22 192 lb 4.8 oz (87.2 kg)  05/22/22 187 lb (84.8 kg)    Physical Exam Constitutional:      General: She is not in acute distress.    Appearance: She is not diaphoretic.  Cardiovascular:     Rate and Rhythm: Normal rate and regular rhythm.     Heart sounds: Normal heart sounds.  Pulmonary:     Effort: Pulmonary effort is normal.     Breath sounds: Normal breath sounds.  Skin:    General: Skin is warm and dry.  Neurological:     Mental Status: She is alert.    EKG: Sinus rhythm, rate 70, negative T waves in V1 and V2 are likely normal  Assessment/Plan: Please see individual problem list.  Primary hypertension Assessment & Plan: Chronic issue.  At goal at home for her age.  Discussed checking daily with a goal of less than 140/90.  She will continue amlodipine 5 mg daily.   Palpitations Assessment &  Plan: Recent issue.  Undetermined cause.  EKG performed today.  Lab work as outlined to look for an underlying cause.  Discussed potential for doing a ZIO monitor if her labs are unremarkable.  Orders: -     EKG 12-Lead -     Basic metabolic panel -     TSH -     CBC    Return for As scheduled.   Tommi Rumps, MD [place]

## 2022-07-18 NOTE — Patient Instructions (Signed)
Nice to see you. Please start checking your blood pressure on a daily basis.  Your goal blood pressure is less than 140/90.  If you start running above that consistently please let me know. We will contact you with your lab results.

## 2022-07-18 NOTE — Assessment & Plan Note (Signed)
Chronic issue.  At goal at home for her age.  Discussed checking daily with a goal of less than 140/90.  She will continue amlodipine 5 mg daily.

## 2022-07-19 ENCOUNTER — Ambulatory Visit: Payer: Medicare Other | Attending: Family Medicine

## 2022-07-19 ENCOUNTER — Other Ambulatory Visit: Payer: Self-pay | Admitting: Family Medicine

## 2022-07-19 DIAGNOSIS — R002 Palpitations: Secondary | ICD-10-CM

## 2022-08-01 DIAGNOSIS — R002 Palpitations: Secondary | ICD-10-CM | POA: Diagnosis not present

## 2022-08-04 ENCOUNTER — Other Ambulatory Visit: Payer: Self-pay | Admitting: Family Medicine

## 2022-08-04 DIAGNOSIS — R002 Palpitations: Secondary | ICD-10-CM

## 2022-08-09 ENCOUNTER — Other Ambulatory Visit: Payer: Self-pay

## 2022-08-09 ENCOUNTER — Other Ambulatory Visit: Payer: Self-pay | Admitting: Family Medicine

## 2022-08-09 DIAGNOSIS — M532X2 Spinal instabilities, cervical region: Secondary | ICD-10-CM

## 2022-08-09 DIAGNOSIS — K219 Gastro-esophageal reflux disease without esophagitis: Secondary | ICD-10-CM

## 2022-08-10 ENCOUNTER — Encounter: Payer: Self-pay | Admitting: Family Medicine

## 2022-08-10 ENCOUNTER — Ambulatory Visit (INDEPENDENT_AMBULATORY_CARE_PROVIDER_SITE_OTHER): Payer: Medicare Other | Admitting: Neurosurgery

## 2022-08-10 ENCOUNTER — Encounter: Payer: Self-pay | Admitting: Neurosurgery

## 2022-08-10 ENCOUNTER — Ambulatory Visit
Admission: RE | Admit: 2022-08-10 | Discharge: 2022-08-10 | Disposition: A | Discharge status: Home / Self Care | Payer: Medicare Other | Source: Ambulatory Visit | Attending: Neurosurgery | Admitting: Neurosurgery

## 2022-08-10 VITALS — BP 140/82 | Ht 65.0 in | Wt 191.0 lb

## 2022-08-10 DIAGNOSIS — M532X2 Spinal instabilities, cervical region: Secondary | ICD-10-CM

## 2022-08-10 DIAGNOSIS — M47812 Spondylosis without myelopathy or radiculopathy, cervical region: Secondary | ICD-10-CM | POA: Diagnosis not present

## 2022-08-10 DIAGNOSIS — Z09 Encounter for follow-up examination after completed treatment for conditions other than malignant neoplasm: Secondary | ICD-10-CM

## 2022-08-10 NOTE — Progress Notes (Signed)
   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   HISTORY OF PRESENT ILLNESS: Theresa Reid is status post C1-2 PSF. Overall, she is doing very well post-op.  She reports no neck pain.   PHYSICAL EXAMINATION:  NEUROLOGICAL:  General: In no acute distress.   Awake, alert, oriented to person, place, and time.  Pupils equal round and reactive to light.  Facial tone is symmetric.  Tongue protrusion is midline.  There is no pronator drift.  Strength: Side Biceps Triceps Deltoid Interossei Grip Wrist Ext. Wrist Flex.  R L Incision c/d/I and healing well  Imaging:  No complications noted  Assessment / Plan: Theresa Reid is doing well after C1-2 fusion.   She is doing extremely well. I will see her as needed.  Venetia Night MD Dept of Neurosurgery

## 2022-08-16 ENCOUNTER — Encounter: Payer: Self-pay | Admitting: Internal Medicine

## 2022-08-16 ENCOUNTER — Ambulatory Visit: Payer: Medicare Other | Attending: Internal Medicine | Admitting: Internal Medicine

## 2022-08-16 VITALS — BP 130/83 | HR 69 | Resp 14 | Ht 65.0 in | Wt 191.0 lb

## 2022-08-16 DIAGNOSIS — Z8739 Personal history of other diseases of the musculoskeletal system and connective tissue: Secondary | ICD-10-CM | POA: Diagnosis not present

## 2022-08-16 DIAGNOSIS — M159 Polyosteoarthritis, unspecified: Secondary | ICD-10-CM | POA: Diagnosis not present

## 2022-08-16 MED ORDER — PREDNISONE 5 MG PO TABS: ORAL_TABLET | ORAL | 0 refills | Status: DC

## 2022-08-16 NOTE — Progress Notes (Signed)
Office Visit Note  Patient: LESSLY Reid             Date of Birth: 06-15-1940           MRN: [id number]             PCP: Glori Luis, MD Referring: Glori Luis, MD Visit Date: 08/16/2022   Subjective:  Follow-up (Patient states her knees have started to bother her.)   History of Present Illness: Theresa Reid is a 82 y.o. female here for follow up for PMR and generalized osteoarthritis on prednisone 5 mg PRN. She has been taking the prednisone intermittently, when experiencing a flare up takes a tapering dose for less than 2 weeks total duration usually with quick resolution of symptoms. She has daily joint stiffness about 30 minutes. Knee pain bothers her worse on days she spends longer on her feet. She had a recent fall on her stairs at home and hit her back. There was no significant injury found. She has not fallen for greater than 30 years prior to this. She had recent bone density test in February with t-score -1.0.   Previous HPI 09/27/21 Theresa Reid is a 82 y.o. female here for follow up for PMR and generalized osteoarthritis. Since our visit last year she has infrequent exacerbations of symptoms less than monthly which quickly improve on prednisone 5 mg. Each such incident is usually less than 1 week. Besides this symptoms are overall stable. She also reports some right shoulder pain when lying in bed to sleep on that side. She has some increased stiffness and difficulty reaching far above and behind.   Previous HPI 10/01/2020 Theresa Reid is a 82 y.o. female here for evaluation for PMR previously diagnosed 2016 and has been taking prednisone 5 mg PO daily dose and seeing [facility] rheumatology Doreen Salvage for this now transitioning care due to leaving the practice.  Currently she experiences a chronic mild to moderate level of joint pain in multiple sites with osteoarthritis.  She experiences flareup of joint pain symptoms which she treats with  prednisone 5-10 milligrams daily as needed which she estimates is maybe only a few days out of each month.  Her most problematic area is in the right shoulder.  She does have history of right breast cancer treatment of which has some residual right arm lymphedema.  Currently today she is feeling well.   Review of Systems  Constitutional:  Negative for fatigue.  HENT:  Negative for mouth sores and mouth dryness.   Eyes:  Negative for dryness.  Respiratory:  Negative for shortness of breath.   Cardiovascular:  Positive for palpitations. Negative for chest pain.  Gastrointestinal:  Negative for blood in stool, constipation and diarrhea.  Endocrine: Negative for increased urination.  Genitourinary:  Negative for involuntary urination.  Musculoskeletal:  Positive for joint pain, gait problem, joint pain, joint swelling, myalgias, morning stiffness and myalgias. Negative for muscle weakness and muscle tenderness.  Skin:  Negative for color change, rash, hair loss and sensitivity to sunlight.  Allergic/Immunologic: Negative for susceptible to infections.  Neurological:  Negative for dizziness and headaches.  Hematological:  Negative for swollen glands.  Psychiatric/Behavioral:  Negative for depressed mood and sleep disturbance. The patient is not nervous/anxious.     PMFS History:  Patient Active Problem List   Diagnosis Date Noted   Palpitations 07/18/2022   Trapezius strain 05/03/2022   History of diverticulitis 05/03/2022   Fatigue 01/04/2022   S/P  cervical spinal fusion 11/16/2021   Dens fracture 11/04/2021   Pain in right shoulder 09/27/2021   Prediabetes 12/16/2020   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   Cataract 09/24/2018   Urge incontinence of urine 09/24/2018   Malignant neoplasm of right female breast 06/07/2018   Family history of breast cancer    Dairy product intolerance 01/14/2018   Seborrheic keratosis 04/10/2017   Dysuria 02/19/2017   Visual disturbance  10/03/2016   GERD (gastroesophageal reflux disease) 09/14/2016   HLD (hyperlipidemia) 08/13/2015   Seborrheic dermatitis 07/01/2015   Hypertension 11/13/2014   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    Past Medical History:  Diagnosis Date   Arthritis    Breast cancer    Diverticulitis    Family history of breast cancer    GERD (gastroesophageal reflux disease)    Hyperlipidemia    Hypertension    Lymphedema    right arm   Personal history of chemotherapy    Personal history of radiation therapy    Pre-diabetes     Family History  Problem Relation Age of Onset   Alcohol abuse Mother        deceased 55   Arthritis Mother    Diabetes Father    Breast cancer Maternal Aunt        dx 2s; deceased 9s   Breast cancer Maternal Aunt        dx 39s; deceased 15s   Arthritis Maternal Grandmother    Past Surgical History:  Procedure Laterality Date   APPLICATION OF INTRAOPERATIVE CT SCAN N/A 11/16/2021   Procedure: APPLICATION OF INTRAOPERATIVE CT SCAN;  Surgeon: Venetia Night, MD;  Location: [facility];  Service: Neurosurgery;  Laterality: N/A;   BREAST BIOPSY Right 05/28/2018   [place] bx, INVASIVE MAMMARY CARCINOMA WITH LOBULAR FEATURES   BREAST LUMPECTOMY Right 06/12/2018   [facility], lobular features   BREAST LUMPECTOMY WITH SENTINEL LYMPH NODE BIOPSY Right 06/12/2018   Procedure: RIGHT BREAST WIDE EXCISION WITH SENTINEL LYMPH NODE BX;  Surgeon: Earline Mayotte, MD;  Location: [facility];  Service: General;  Laterality: Right;   CHOLECYSTECTOMY     GALLBLADDER SURGERY  1997   INCISION AND DRAINAGE OF WOUND Right 10/30/2018   Procedure: IRRIGATION AND DEBRIDEMENT RIGHT CHEST WALL WOUND;  Surgeon: Earline Mayotte, MD;  Location: [facility];  Service: General;  Laterality: Right;   MASTECTOMY Right    MASTECTOMY WITH AXILLARY LYMPH NODE DISSECTION Right 09/20/2018   Procedure: MASTECTOMY WITH AXILLARY LYMPH NODE DISSECTION RIGHT;  Surgeon: Earline Mayotte, MD;  Location:  [facility];  Service: General;  Laterality: Right;   ovaraian cyst removal Right    Cyst only (NOT OVARY)   PORTACATH PLACEMENT Left 09/20/2018   Procedure: INSERTION PORT-A-CATH, LEFT;  Surgeon: Earline Mayotte, MD;  Location: [facility];  Service: General;  Laterality: Left;   POSTERIOR CERVICAL FUSION/FORAMINOTOMY N/A 11/16/2021   Procedure: C1-2 POSTERIOR FUSION WITH ORIF (OPEN REDUCTION INTERNAL FIXATION) C2 FRACTURE (GLOBUS KORE FIBER);  Surgeon: Venetia Night, MD;  Location: [facility];  Service: Neurosurgery;  Laterality: N/A;   TOTAL KNEE ARTHROPLASTY Bilateral 05/05/2010   Social History   Social History Narrative   Retired    Lives by herself    Pets: None   Caffeine- Coffee 2 cups daily, no tea/soda      Immunization History  Administered Date(s) Administered   Fluad Quad(high Dose 65+) 01/06/2019, 01/04/2022   Influenza Split 12/28/2010   Influenza, High Dose Seasonal PF 01/22/2018   Influenza,  Seasonal, Injecte, Preservative Fre 01/23/2012   Influenza,inj,Quad PF,6+ Mos 02/06/2013, 02/03/2014   Influenza,inj,quad, With Preservative 01/22/2017   Influenza-Unspecified 12/15/2014, 01/12/2016, 12/30/2016, 02/19/2020, 02/07/2021   PFIZER(Purple Top)SARS-COV-2 Vaccination 05/23/2019, 06/13/2019, 04/13/2020   Pfizer Covid-19 Vaccine Bivalent Booster 63yrs & up 02/18/2021, 02/18/2022   Pneumococcal Conjugate-13 02/11/2016, 03/01/2017   Pneumococcal Polysaccharide-23 01/04/2010, 11/08/2012   Pneumococcal-Unspecified 01/22/2017   Tdap 01/22/2017   Zoster Recombinat (Shingrix) 03/01/2017, 02/18/2022   Zoster, Live 09/22/2008     Objective: Vital Signs: BP 130/83 (BP Location: Left Arm, Patient Position: Sitting, Cuff Size: Normal)   Pulse 69   Resp 14   Ht 5\' 5"  (1.651 m)   Wt 191 lb (86.6 kg)   BMI 31.78 kg/m    Physical Exam Constitutional:      Appearance: She is obese.  Cardiovascular:     Rate and Rhythm: Normal rate and regular rhythm.  Pulmonary:      Effort: Pulmonary effort is normal.     Breath sounds: Normal breath sounds.  Skin:    Findings: No rash.  Neurological:     Mental Status: She is alert.  Psychiatric:        Mood and Affect: Mood normal.     Musculoskeletal Exam:  Neck restricted lateral rotation and flexion and extension Shoulders limited in abduction and external rotation b/l, no tenderness to pressure Elbows full ROM no tenderness or swelling Wrists full ROM no tenderness or swelling Fingers full ROM no tenderness or swelling Knees tenderness to pressure on left knee along superior side of joint, no palpable effusions   Investigation: No additional findings.  Imaging: DG Cervical Spine 2 or 3 views  Result Date: 08/14/2022 CLINICAL DATA:  Cervical fusion. History of cervical spine instability. EXAM: CERVICAL SPINE - 2-3 VIEW COMPARISON:  Cervical spine series 02/09/2022. MRI cervical spine 11/05/2021. CT cervical spine 11/04/2021. FINDINGS: C1-C2 posterior fusion again noted. Hardware intact. Anatomic alignment. Stable appearance from prior exam. Diffuse degenerative change. No new acute bony abnormality identified. Pulmonary apices are clear. IMPRESSION: 1. C1-C2 posterior fusion again noted. Hardware intact. Anatomic alignment. Stable appearance from prior exam. 2. Diffuse degenerative change. No new acute bony abnormality. Electronically Signed   By: Maisie Fus  Register M.D.   On: 08/14/2022 08:07   LONG TERM MONITOR (3-14 DAYS)  Result Date: 08/02/2022 HR 44 - 128 bpm, average 79 bpm. Rare supraventricular and ventricular ectopy. No sustained arrhythmias. No atrial fibrillation. Wenckebach (Mobitz I) present. Sheria Lang T. Lalla Brothers, MD, [facility], [facility] Cardiac Electrophysiology    Recent Labs: Lab Results  Component Value Date   WBC 7.7 07/18/2022   HGB 14.0 07/18/2022   PLT 256.0 07/18/2022   NA 141 07/18/2022   K 3.8 07/18/2022   CL 103 07/18/2022   CO2 28 07/18/2022   GLUCOSE 88 07/18/2022   BUN 10 07/18/2022    CREATININE 0.64 07/18/2022   BILITOT 0.5 06/23/2022   ALKPHOS 91 06/23/2022   AST 20 06/23/2022   ALT 15 06/23/2022   PROT 6.8 06/23/2022   ALBUMIN 3.7 06/23/2022   CALCIUM 9.2 07/18/2022   GFRAA >60 12/08/2019    Speciality Comments: No specialty comments available.  Procedures:  No procedures performed Allergies: Patient has no known allergies.   Assessment / Plan:     Visit Diagnoses: Primary osteoarthritis involving multiple joints - Plan: predniSONE (DELTASONE) 5 MG tablet  Knee pain more consistent with primary osteoarthritis.  Probably getting partial benefit to this from the intermittent low-dose steroids as well.  Not currently in  exacerbation.  Discussed if she starts to have repeated falls or even partial loss of balance consider physical therapy a good option.  History of polymyalgia rheumatica - Plan: predniSONE (DELTASONE) 5 MG tablet  Polymyalgia symptoms appear to be well-controlled on current 5 mg prednisone as needed.  By pharmacy fill history total utilization on average closer to 2 to 3 mg/day and had okay bone densitometry.  Orders: No orders of the defined types were placed in this encounter.  Meds ordered this encounter  Medications   predniSONE (DELTASONE) 5 MG tablet    Sig: TAKE 1 TO 2 TABLETS BY MOUTH  DAILY AS NEEDED FOR ARTHRITIS  FLARE    Dispense:  90 tablet    Refill:  0     Follow-Up Instructions: Return in about 1 year (around 08/16/2023) for OA/PMR on GC PRN f/u 37yr.   Fuller Plan, MD  Note - This record has been created using [employer].  Chart creation errors have been sought, but may not always  have been located. Such creation errors do not reflect on  the standard of medical care.

## 2022-08-28 ENCOUNTER — Ambulatory Visit: Payer: Medicare Other | Admitting: Family Medicine

## 2022-09-08 ENCOUNTER — Ambulatory Visit: Payer: Medicare Other | Admitting: Occupational Therapy

## 2022-09-14 ENCOUNTER — Ambulatory Visit: Payer: Medicare Other | Attending: Internal Medicine | Admitting: Occupational Therapy

## 2022-09-14 DIAGNOSIS — I972 Postmastectomy lymphedema syndrome: Secondary | ICD-10-CM | POA: Diagnosis not present

## 2022-09-14 NOTE — Therapy (Signed)
[facility] [address], [place], [address] Phone: [phone]   Fax:  [phone]  Occupational Therapy Treatment/Recert  Patient Details  Name: Theresa Reid MRN: [id number] Date of Birth: 1940-08-16 No data recorded  Encounter Date: 09/14/2022   OT End of Session - 09/14/22 1440     Visit Number 19    Number of Visits 19    Date for OT Re-Evaluation 09/14/22    OT Start Time 1400    OT Stop Time 1430    OT Time Calculation (min) 30 min    Activity Tolerance Patient tolerated treatment well    Behavior During Therapy [facility] for tasks assessed/performed             Past Medical History:  Diagnosis Date   Arthritis    Breast cancer (HCC)    Diverticulitis    Family history of breast cancer    GERD (gastroesophageal reflux disease)    Hyperlipidemia    Hypertension    Lymphedema    right arm   Personal history of chemotherapy    Personal history of radiation therapy    Pre-diabetes     Past Surgical History:  Procedure Laterality Date   APPLICATION OF INTRAOPERATIVE CT SCAN N/A 11/16/2021   Procedure: APPLICATION OF INTRAOPERATIVE CT SCAN;  Surgeon: Venetia Night, MD;  Location: [facility];  Service: Neurosurgery;  Laterality: N/A;   BREAST BIOPSY Right 05/28/2018   [place] bx, INVASIVE MAMMARY CARCINOMA WITH LOBULAR FEATURES   BREAST LUMPECTOMY Right 06/12/2018   [facility], lobular features   BREAST LUMPECTOMY WITH SENTINEL LYMPH NODE BIOPSY Right 06/12/2018   Procedure: RIGHT BREAST WIDE EXCISION WITH SENTINEL LYMPH NODE BX;  Surgeon: Earline Mayotte, MD;  Location: [facility];  Service: General;  Laterality: Right;   CHOLECYSTECTOMY     GALLBLADDER SURGERY  1997   INCISION AND DRAINAGE OF WOUND Right 10/30/2018   Procedure: IRRIGATION AND DEBRIDEMENT RIGHT CHEST WALL WOUND;  Surgeon: Earline Mayotte, MD;  Location: [facility];  Service: General;  Laterality: Right;   MASTECTOMY Right    MASTECTOMY  WITH AXILLARY LYMPH NODE DISSECTION Right 09/20/2018   Procedure: MASTECTOMY WITH AXILLARY LYMPH NODE DISSECTION RIGHT;  Surgeon: Earline Mayotte, MD;  Location: [facility];  Service: General;  Laterality: Right;   ovaraian cyst removal Right    Cyst only (NOT OVARY)   PORTACATH PLACEMENT Left 09/20/2018   Procedure: INSERTION PORT-A-CATH, LEFT;  Surgeon: Earline Mayotte, MD;  Location: [facility];  Service: General;  Laterality: Left;   POSTERIOR CERVICAL FUSION/FORAMINOTOMY N/A 11/16/2021   Procedure: C1-2 POSTERIOR FUSION WITH ORIF (OPEN REDUCTION INTERNAL FIXATION) C2 FRACTURE (GLOBUS KORE FIBER);  Surgeon: Venetia Night, MD;  Location: [facility];  Service: Neurosurgery;  Laterality: N/A;   TOTAL KNEE ARTHROPLASTY Bilateral 05/05/2010    There were no vitals filed for this visit.   Subjective Assessment - 09/14/22 1438     Subjective  The soft black splint you gave me really helped the L thumb putting on my sleeve- doing okay my shoulder bothered me and did 2 sessions of PT    Pertinent History Patient had her original lumpectomy and sentinel lymph node biopsy back in February 2020 R breast   She then had an MRI which showed more areas of enhancement and was taken back to the OR for mastectomy and axillary lymph node dissection on 09/20/2018 which showed 15 of the lymph nodes were also positive for malignancy  and this upstaged her to stage IIIa disease.  Plan was to offer her adjuvant chemotherapy following this.  However her mastectomy course was complicated by flap necrosis requiring debridement antibiotics and she had to be also taken to the OR on 10/30/2018.  Her wound did not completely healed  and she still has a drain in place.  She seen  Dr. Everlene Farrier on 11/18/2018.  There is concern for early cellulitis of her chest wall.  Dr. Everlene Farrier after his visit started  patient on Augmentin for 2 more weeks which further delays her chemotherapy. Lymphedema in R UE per pt developed after surgery on  10/30/2018 - stayed the same with compression on R UE and use of jovipak breast pad  - denies pain - pt comes every about 5-7 months for re assessent and getting replacement for her daytime compression sleeve- was seen March 23 and did not get replacement - pt fell about 2 months ago and ended up with neck fx and had cervcal fusion of C1 and C2 about 6 wks ago    Patient Stated Goals I want to see if need new compression sleeve to prevent lymphedema to increase and infection    Currently in Pain? No/denies                 LYMPHEDEMA/ONCOLOGY QUESTIONNAIRE - 09/14/22 0001       Right Upper Extremity Lymphedema   15 cm Proximal to Olecranon Process 39 cm    10 cm Proximal to Olecranon Process 38 cm    Olecranon Process 25 cm    15 cm Proximal to Ulnar Styloid Process 22 cm    10 cm Proximal to Ulnar Styloid Process 19 cm    Just Proximal to Ulnar Styloid Process 14.7 cm    Across Hand at [facility] 17 cm      Left Upper Extremity Lymphedema   15 cm Proximal to Olecranon Process 39 cm    10 cm Proximal to Olecranon Process 39 cm    Olecranon Process 25.5 cm    15 cm Proximal to Ulnar Styloid Process 21.5 cm    10 cm Proximal to Ulnar Styloid Process 19 cm    Just Proximal to Ulnar Styloid Process 15 cm            Patient arrives with right Jobst Elvarex compression sleeve and glove.  Patient had this one since Nov 23- report she has 2nd one she got and did not use. She is wearing her compression daily and doing well and able to maintain her circumference in the right upper extremity. Pt has  left CMC thumb arthritis as well as history of cervical fracture.  Reviewed with patient again technique to don her compression sleeve in supine using gravity to ease  donning. Patient was fitted with a CMC neoprene wrap  last time to use on left hand with donning of compression sleeve. Report helping a lot. Lymphedema measurements within normal limits compared to her left upper  extremity and prior measurements. Glove still fitting well. Patient to follow-up with me as needed in 7 to 9 months. Pt had some pain in upper traps since seen last time and had PT - pt report doing HEP for mid and lower traps- but google and started 2 other HEP  And some discomfort under arm and Pect  Recommend for pt to do only PT HEP  Several times in session pt used upper traps to stabilize L UE Pt ed on  anatomy                   OT Education - 09/14/22 1440     Education Details Wearing compression sleeve and garments    Person(s) Educated Patient    Methods Explanation;Demonstration;Tactile cues;Verbal cues;Handout    Comprehension Returned demonstration;Verbalized understanding              OT Short Term Goals - 07/11/21 1502       OT SHORT TERM GOAL #1   Title Pt's R UE circumference decrease by at least 1cm -2 cm from hand to upper arm - to be fitted for appropriate compression garments    Status Achieved               OT Long Term Goals - 09/14/22 1449       OT LONG TERM GOAL #2   Title Pt maintain her R UE and thoracic lymphedema  circumference with correct compression garments and HEP    Baseline Pt cont to wear  Jobst compression sleeve with glove -as well as jovipak breast pad- measurements WNL compare to L and in past    Time 1    Period Weeks    Status Achieved    Target Date 09/14/22                   Plan - 09/14/22 1441     Clinical Impression Statement Pt with diagnosis of R UE lymphedema - she done great thru the years to maintain her R UE circumference with Jobst Elvarex sleeve and glove and jovipak breast pad as needed- was fitted in Nov 23 last time. . Circumference in R UE compare to the L  and in the past  WNL using Jobst Elvarex soft sleeve and glove. Pt report she had another sleeve she got around Nov 23 that she can switch to. Per pt her glove is still  working well. Recommend for her to use her Jovipak breastpad   as needed night time or day time to  clear thoracic lymphedema.  Pt had some pain in upper trap since seen last time and had PT. Report she started couple of exercises she googled- some pain at times under arm and pect. Recommend for pt to do only the HEP that the PT provided her.  REviewed again donning garment in supine that gravity can assist her.  Pt to follow up with me as needed 6-9 months.    OT Occupational Profile and History Problem Focused Assessment - Including review of records relating to presenting problem    Occupational performance deficits (Please refer to evaluation for details): ADL's;Play;Leisure;IADL's    Body Structure / Function / Physical Skills ADL;UE functional use;Scar mobility;Skin integrity;Edema    Rehab Potential Good    Clinical Decision Making Several treatment options, min-mod task modification necessary    Comorbidities Affecting Occupational Performance: May have comorbidities impacting occupational performance    Modification or Assistance to Complete Evaluation  No modification of tasks or assist necessary to complete eval    OT Frequency 1x / week    OT Duration --   1 wk   OT Treatment/Interventions Self-care/ADL training;Manual lymph drainage;Therapeutic exercise;Patient/family education;Manual Therapy;Compression bandaging    Consulted and Agree with Plan of Care Patient             Patient will benefit from skilled therapeutic intervention in order to improve the following deficits and impairments:   Body Structure / Function / Physical  Skills: ADL, UE functional use, Scar mobility, Skin integrity, Edema       Visit Diagnosis: Postmastectomy lymphedema syndrome    Problem List Patient Active Problem List   Diagnosis Date Noted   Palpitations 07/18/2022   Trapezius strain 05/03/2022   History of diverticulitis 05/03/2022   Fatigue 01/04/2022   S/P cervical spinal fusion 11/16/2021   Dens fracture (HCC) 11/04/2021   Pain in right  shoulder 09/27/2021   Prediabetes 12/16/2020   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   Cataract 09/24/2018   Urge incontinence of urine 09/24/2018   Malignant neoplasm of right female breast (HCC) 06/07/2018   Family history of breast cancer    Dairy product intolerance 01/14/2018   Seborrheic keratosis 04/10/2017   Dysuria 02/19/2017   Visual disturbance 10/03/2016   GERD (gastroesophageal reflux disease) 09/14/2016   HLD (hyperlipidemia) 08/13/2015   Seborrheic dermatitis 07/01/2015   Hypertension 11/13/2014   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    Oletta Cohn, OTR/L,CLT 09/14/2022, 2:53 PM  [facility] [address], [place], [address] Phone: [phone]   Fax:  [phone]  Name: SHELISA GRIST MRN: [id number] Date of Birth: 10-06-1940

## 2022-10-02 ENCOUNTER — Ambulatory Visit: Payer: Medicare Other | Admitting: Internal Medicine

## 2022-10-06 ENCOUNTER — Ambulatory Visit: Payer: Medicare Other | Admitting: Family Medicine

## 2022-10-13 ENCOUNTER — Encounter: Payer: Self-pay | Admitting: Cardiology

## 2022-10-13 ENCOUNTER — Ambulatory Visit: Payer: Medicare Other | Attending: Cardiology | Admitting: Cardiology

## 2022-10-13 VITALS — BP 156/84 | HR 83 | Ht 65.0 in | Wt 194.8 lb

## 2022-10-13 DIAGNOSIS — R002 Palpitations: Secondary | ICD-10-CM

## 2022-10-13 DIAGNOSIS — I10 Essential (primary) hypertension: Secondary | ICD-10-CM | POA: Diagnosis not present

## 2022-10-13 DIAGNOSIS — E782 Mixed hyperlipidemia: Secondary | ICD-10-CM

## 2022-10-13 NOTE — Progress Notes (Signed)
Cardiology Office Note:    Date:  10/13/2022   ID:  BROCK MOKRY, DOB 03-13-1941, MRN [id number]  PCP:  Glori Luis, MD   [facility] Providers Cardiologist:  None     Referring MD: Glori Luis, MD   Chief Complaint  Patient presents with   New Patient (Initial Visit)    Patient denies new or acute cardiac problems/concerns today.  Wore Zio monitor as ordered by PCP with no cardiac history.    History of Present Illness:    Theresa Reid is a 81 y.o. female with a hx of hypertension, hyperlipidemia presenting with palpitations.    Complain of symptoms of palpitations ongoing over several months lasting a few seconds at a time.  She saw her primary care physician due to symptoms of palpitations, cardiac monitor was placed.  Monitor revealed rare PVCs and PACs, no sustained arrhythmias, no A-fib.  She states feeling well, denies any significant palpitations over the past 2 to 3 weeks.  Denies dizziness, presyncope or syncope.  Denies chest pain or shortness of breath.  Blood pressure usually runs in the 120s to 140s at home.  Echocardiogram 2020 EF 60 to 65%.  Past Medical History:  Diagnosis Date   Arthritis    Breast cancer (HCC)    Diverticulitis    Family history of breast cancer    GERD (gastroesophageal reflux disease)    Hyperlipidemia    Hypertension    Lymphedema    right arm   Personal history of chemotherapy    Personal history of radiation therapy    Pre-diabetes     Past Surgical History:  Procedure Laterality Date   APPLICATION OF INTRAOPERATIVE CT SCAN N/A 11/16/2021   Procedure: APPLICATION OF INTRAOPERATIVE CT SCAN;  Surgeon: Venetia Night, MD;  Location: [facility] OR[facility];  Service: Neurosurgery;  Laterality: N/A;   BREAST BIOPSY Right 05/28/2018   [place] bx, INVASIVE MAMMARY CARCINOMA WITH LOBULAR FEATURES   BREAST LUMPECTOMY Right 06/12/2018   [facility], lobular features   BREAST LUMPECTOMY WITH SENTINEL LYMPH NODE BIOPSY Right  06/12/2018   Procedure: RIGHT BREAST WIDE EXCISION WITH SENTINEL LYMPH NODE BX;  Surgeon: Earline Mayotte, MD;  Location: [facility];  Service: General;  Laterality: Right;   CHOLECYSTECTOMY     GALLBLADDER SURGERY  1997   INCISION AND DRAINAGE OF WOUND Right 10/30/2018   Procedure: IRRIGATION AND DEBRIDEMENT RIGHT CHEST WALL WOUND;  Surgeon: Earline Mayotte, MD;  Location: [facility];  Service: General;  Laterality: Right;   MASTECTOMY Right    MASTECTOMY WITH AXILLARY LYMPH NODE DISSECTION Right 09/20/2018   Procedure: MASTECTOMY WITH AXILLARY LYMPH NODE DISSECTION RIGHT;  Surgeon: Earline Mayotte, MD;  Location: [facility];  Service: General;  Laterality: Right;   ovaraian cyst removal Right    Cyst only (NOT OVARY)   PORTACATH PLACEMENT Left 09/20/2018   Procedure: INSERTION PORT-A-CATH, LEFT;  Surgeon: Earline Mayotte, MD;  Location: [facility];  Service: General;  Laterality: Left;   POSTERIOR CERVICAL FUSION/FORAMINOTOMY N/A 11/16/2021   Procedure: C1-2 POSTERIOR FUSION WITH ORIF (OPEN REDUCTION INTERNAL FIXATION) C2 FRACTURE (GLOBUS KORE FIBER);  Surgeon: Venetia Night, MD;  Location: [facility];  Service: Neurosurgery;  Laterality: N/A;   TOTAL KNEE ARTHROPLASTY Bilateral 05/05/2010    Current Medications: Current Meds  Medication Sig   acetaminophen (TYLENOL) 325 MG tablet Take 2 tablets (650 mg total) by mouth every 6 (six) hours as needed for mild pain (or Fever >/= 101).  amLODipine (NORVASC) 5 MG tablet TAKE 1 TABLET BY MOUTH DAILY   anastrozole (ARIMIDEX) 1 MG tablet TAKE 1 TABLET BY MOUTH DAILY   atorvastatin (LIPITOR) 80 MG tablet TAKE 1 TABLET BY MOUTH ONCE  DAILY   CALCIUM-VITAMIN D PO Take 1 tablet by mouth daily.    Multiple Vitamins tablet Take 1 tablet by mouth daily.    omeprazole (PRILOSEC) 20 MG capsule TAKE 1 CAPSULE BY MOUTH DAILY   oxybutynin (DITROPAN) 5 MG tablet TAKE 1 TABLET BY MOUTH TWICE  DAILY   predniSONE (DELTASONE) 5 MG tablet TAKE 1 TO 2  TABLETS BY MOUTH  DAILY AS NEEDED FOR ARTHRITIS  FLARE     Allergies:   Patient has no known allergies.   Social History   Socioeconomic History   Marital status: Single    Spouse name: Not on file   Number of children: Not on file   Years of education: Not on file   Highest education level: Some college, no degree  Occupational History   Occupation: retired  Tobacco Use   Smoking status: Never    Passive exposure: Never   Smokeless tobacco: Never  Vaping Use   Vaping Use: Never used  Substance and Sexual Activity   Alcohol use: No    Alcohol/week: 0.0 standard drinks of alcohol   Drug use: No   Sexual activity: Never  Other Topics Concern   Not on file  Social History Narrative   Retired    Lives by herself    Pets: None   Caffeine- Coffee 2 cups daily, no tea/soda      Social Determinants of Health   Financial Resource Strain: Low Risk  (07/17/2022)   Overall Financial Resource Strain (CARDIA)    Difficulty of Paying Living Expenses: Not hard at all  Food Insecurity: No Food Insecurity (07/17/2022)   Hunger Vital Sign    Worried About Running Out of Food in the Last Year: Never true    Ran Out of Food in the Last Year: Never true  Transportation Needs: No Transportation Needs (07/17/2022)   PRAPARE - Administrator, Civil Service (Medical): No    Lack of Transportation (Non-Medical): No  Physical Activity: Insufficiently Active (07/17/2022)   Exercise Vital Sign    Days of Exercise per Week: 4 days    Minutes of Exercise per Session: 30 min  Stress: No Stress Concern Present (07/17/2022)   [employer] of Occupational Health - Occupational Stress Questionnaire    Feeling of Stress : Not at all  Social Connections: Socially Isolated (07/17/2022)   Social Connection and Isolation Panel [NHANES]    Frequency of Communication with Friends and Family: More than three times a week    Frequency of Social Gatherings with Friends and Family: More than  three times a week    Attends Religious Services: Never    Database administrator or Organizations: No    Attends Engineer, structural: Not on file    Marital Status: Divorced     Family History: The patient's family history includes Alcohol abuse in her mother; Arthritis in her maternal grandmother and mother; Breast cancer in her maternal aunt and maternal aunt; Diabetes in her father.  ROS:   Please see the history of present illness.     All other systems reviewed and are negative.  EKGs/Labs/Other Studies Reviewed:    The following studies were reviewed today:  EKG shows sinus rhythm,      Recent  Labs: 06/23/2022: ALT 15 07/18/2022: BUN 10; Creatinine, Ser 0.64; Hemoglobin 14.0; Platelets 256.0; Potassium 3.8; Sodium 141; TSH 1.54  Recent Lipid Panel    Component Value Date/Time   CHOL 184 10/31/2021 1022   CHOL 204 (H) 11/13/2014 0948   TRIG 189.0 (H) 10/31/2021 1022   HDL 56.10 10/31/2021 1022   HDL 56 11/13/2014 0948   CHOLHDL 3 10/31/2021 1022   VLDL 37.8 10/31/2021 1022   LDLCALC 90 10/31/2021 1022   LDLCALC 106 (H) 11/13/2014 0948   LDLDIRECT 90.0 07/11/2017 0948     Risk Assessment/Calculations:    HYPERTENSION CONTROL Vitals:   10/13/22 1414 10/13/22 1422  BP: (!) 156/92 (!) 156/84    The patient's blood pressure is elevated above target today.  In order to address the patient's elevated BP: Blood pressure will be monitored at home to determine if medication changes need to be made.         Physical Exam:    VS:  BP (!) 156/84 (BP Location: Right Arm, Patient Position: Sitting, Cuff Size: Large)   Pulse 83   Ht 5\' 5"  (1.651 m)   Wt 194 lb 12.8 oz (88.4 kg)   SpO2 96%   BMI 32.42 kg/m     Wt Readings from Last 3 Encounters:  10/13/22 194 lb 12.8 oz (88.4 kg)  08/16/22 191 lb (86.6 kg)  08/10/22 191 lb (86.6 kg)     GEN:  Well nourished, well developed in no acute distress HEENT: Normal NECK: No JVD; No carotid bruits CARDIAC:  RRR, no murmurs, rubs, gallops RESPIRATORY:  Clear to auscultation without rales, wheezing or rhonchi  ABDOMEN: Soft, non-tender, non-distended MUSCULOSKELETAL:  No edema; No deformity  SKIN: Warm and dry NEUROLOGIC:  Alert and oriented x 3 PSYCHIATRIC:  Normal affect   ASSESSMENT:    1. Palpitations   2. Primary hypertension   3. Mixed hyperlipidemia    PLAN:    In order of problems listed above:  Palpitations, cardiac monitor showed no significant arrhythmias.  Rare PACs and PVCs.  Symptoms now rarely according, previous echocardiogram with normal EF.  Patient reassured. Hypertension, BP elevated, usually controlled.  Monitor for now.  Continue Norvasc 5 mg daily. Hyperlipidemia, continue Lipitor 80  Follow-up as needed     Medication Adjustments/Labs and Tests Ordered: Current medicines are reviewed at length with the patient today.  Concerns regarding medicines are outlined above.  Orders Placed This Encounter  Procedures   EKG 12-Lead   No orders of the defined types were placed in this encounter.   Patient Instructions  Medication Instructions:   Your physician recommends that you continue on your current medications as directed. Please refer to the Current Medication list given to you today.  *If you need a refill on your cardiac medications before your next appointment, please call your pharmacy*   Lab Work:  None Ordered  If you have labs (blood work) drawn today and your tests are completely normal, you will receive your results only by: MyChart Message (if you have MyChart) OR A paper copy in the mail If you have any lab test that is abnormal or we need to change your treatment, we will call you to review the results.   Testing/Procedures:  None Ordered   Follow-Up: At [facility], you and your health needs are our priority.  As part of our continuing mission to provide you with exceptional heart care, we have created designated Provider  Care Teams.  These Care Teams  include your primary Cardiologist (physician) and Advanced Practice Providers (APPs -  Physician Assistants and Nurse Practitioners) who all work together to provide you with the care you need, when you need it.  We recommend signing up for the patient portal called "MyChart".  Sign up information is provided on this After Visit Summary.  MyChart is used to connect with patients for Virtual Visits (Telemedicine).  Patients are able to view lab/test results, encounter notes, upcoming appointments, etc.  Non-urgent messages can be sent to your provider as well.   To learn more about what you can do with MyChart, go to [email or web address].    Your next appointment:    As needed    Signed, Debbe Odea, MD  10/13/2022 3:12 PM    [facility]

## 2022-10-13 NOTE — Patient Instructions (Signed)
Medication Instructions:   Your physician recommends that you continue on your current medications as directed. Please refer to the Current Medication list given to you today.  *If you need a refill on your cardiac medications before your next appointment, please call your pharmacy*   Lab Work:  None Ordered  If you have labs (blood work) drawn today and your tests are completely normal, you will receive your results only by: MyChart Message (if you have MyChart) OR A paper copy in the mail If you have any lab test that is abnormal or we need to change your treatment, we will call you to review the results.   Testing/Procedures:  None Ordered   Follow-Up: At [facility], you and your health needs are our priority.  As part of our continuing mission to provide you with exceptional heart care, we have created designated Provider Care Teams.  These Care Teams include your primary Cardiologist (physician) and Advanced Practice Providers (APPs -  Physician Assistants and Nurse Practitioners) who all work together to provide you with the care you need, when you need it.  We recommend signing up for the patient portal called "MyChart".  Sign up information is provided on this After Visit Summary.  MyChart is used to connect with patients for Virtual Visits (Telemedicine).  Patients are able to view lab/test results, encounter notes, upcoming appointments, etc.  Non-urgent messages can be sent to your provider as well.   To learn more about what you can do with MyChart, go to [email or web address].    Your next appointment:    As needed 

## 2022-11-01 ENCOUNTER — Ambulatory Visit (INDEPENDENT_AMBULATORY_CARE_PROVIDER_SITE_OTHER): Payer: Medicare Other | Admitting: Family Medicine

## 2022-11-01 ENCOUNTER — Encounter: Payer: Self-pay | Admitting: Family Medicine

## 2022-11-01 VITALS — BP 130/80 | HR 74 | Temp 98.2°F | Ht 65.0 in | Wt 195.2 lb

## 2022-11-01 DIAGNOSIS — L609 Nail disorder, unspecified: Secondary | ICD-10-CM | POA: Diagnosis not present

## 2022-11-01 DIAGNOSIS — I10 Essential (primary) hypertension: Secondary | ICD-10-CM

## 2022-11-01 DIAGNOSIS — E785 Hyperlipidemia, unspecified: Secondary | ICD-10-CM | POA: Diagnosis not present

## 2022-11-01 DIAGNOSIS — G629 Polyneuropathy, unspecified: Secondary | ICD-10-CM

## 2022-11-01 DIAGNOSIS — K219 Gastro-esophageal reflux disease without esophagitis: Secondary | ICD-10-CM

## 2022-11-01 NOTE — Patient Instructions (Signed)
Nice to see you. Will get lab work today. Please avoid getting your fingernails overly exposed to wet conditions.  Please avoid use of nail polish or nail polish remover's.  You can also try starting biotin.

## 2022-11-01 NOTE — Assessment & Plan Note (Signed)
Chronic issue.  At goal at home for her age.  She will continue to check her blood pressure at home.  She will continue amlodipine 5 mg daily.

## 2022-11-01 NOTE — Progress Notes (Signed)
Marikay Alar, MD Phone: [phone]  Theresa Reid is a 82 y.o. female who presents today for f/u.  HYPERTENSION Disease Monitoring Home BP Monitoring 135-140 systolic Chest pain- no    Dyspnea- no Medications Compliance-  taking amlodipine.   Edema- no BMET    Component Value Date/Time   NA 141 07/18/2022 1153   NA 142 07/16/2015 0000   NA 138 11/08/2012 0548   K 3.8 07/18/2022 1153   K 3.3 (L) 11/08/2012 0548   CL 103 07/18/2022 1153   CL 103 11/08/2012 0548   CO2 28 07/18/2022 1153   CO2 28 11/08/2012 0548   GLUCOSE 88 07/18/2022 1153   GLUCOSE 97 11/08/2012 0548   BUN 10 07/18/2022 1153   BUN 10 07/16/2015 0000   BUN 6 (L) 11/08/2012 0548   CREATININE 0.64 07/18/2022 1153   CREATININE 0.60 11/08/2012 0548   CALCIUM 9.2 07/18/2022 1153   CALCIUM 8.7 11/08/2012 0548   GFRNONAA >60 06/23/2022 1359   GFRNONAA >60 11/08/2012 0548   GFRAA >60 12/08/2019 1247   GFRAA >60 11/08/2012 0548   HYPERLIPIDEMIA Symptoms Chest pain on exertion:  no Medications: Compliance- taking lipitor Right upper quadrant pain- no  Muscle aches- no Lipid Panel     Component Value Date/Time   CHOL 184 10/31/2021 1022   CHOL 204 (H) 11/13/2014 0948   TRIG 189.0 (H) 10/31/2021 1022   HDL 56.10 10/31/2021 1022   HDL 56 11/13/2014 0948   CHOLHDL 3 10/31/2021 1022   VLDL 37.8 10/31/2021 1022   LDLCALC 90 10/31/2021 1022   LDLCALC 106 (H) 11/13/2014 0948   LDLDIRECT 90.0 07/11/2017 0948   LABVLDL 42 (H) 11/13/2014 0948   GERD:   Reflux symptoms: no   Abd pain: no   Blood in stool: no  Dysphagia: no   EGD: none  Medication: taking omeprazole  Pertinent fingernails: Patient notes this has been going on for about 6 months.  They break easily and are splitting at times.  She has no diet changes.  She does take a multivitamin.  Neuropathy: Patient notes this is a chronic issue with some numbness in her feet and fingertips that has been going on since she had chemotherapy for her  breast cancer.  She wonders if there is to help with this.  Social History   Tobacco Use  Smoking Status Never   Passive exposure: Never  Smokeless Tobacco Never    Current Outpatient Medications on File Prior to Visit  Medication Sig Dispense Refill   acetaminophen (TYLENOL) 325 MG tablet Take 2 tablets (650 mg total) by mouth every 6 (six) hours as needed for mild pain (or Fever >/= 101).     amLODipine (NORVASC) 5 MG tablet TAKE 1 TABLET BY MOUTH DAILY 100 tablet 2   anastrozole (ARIMIDEX) 1 MG tablet TAKE 1 TABLET BY MOUTH DAILY 60 tablet 5   atorvastatin (LIPITOR) 80 MG tablet TAKE 1 TABLET BY MOUTH ONCE  DAILY 100 tablet 2   CALCIUM-VITAMIN D PO Take 1 tablet by mouth daily.      Multiple Vitamins tablet Take 1 tablet by mouth daily.      omeprazole (PRILOSEC) 20 MG capsule TAKE 1 CAPSULE BY MOUTH DAILY 100 capsule 2   oxybutynin (DITROPAN) 5 MG tablet TAKE 1 TABLET BY MOUTH TWICE  DAILY 200 tablet 2   predniSONE (DELTASONE) 5 MG tablet TAKE 1 TO 2 TABLETS BY MOUTH  DAILY AS NEEDED FOR ARTHRITIS  FLARE 90 tablet 0   [DISCONTINUED]  prochlorperazine (COMPAZINE) 10 MG tablet Take 1 tablet (10 mg total) by mouth every 6 (six) hours as needed (Nausea or vomiting). 30 tablet 1   No current facility-administered medications on file prior to visit.     ROS see history of present illness  Objective  Physical Exam Vitals:   11/01/22 1348 11/01/22 1420  BP: (!) 142/86 130/80  Pulse: 74   Temp: 98.2 F (36.8 C)   SpO2: 96%     BP Readings from Last 3 Encounters:  11/01/22 130/80  10/13/22 (!) 156/84  08/16/22 130/83   Wt Readings from Last 3 Encounters:  11/01/22 195 lb 3.2 oz (88.5 kg)  10/13/22 194 lb 12.8 oz (88.4 kg)  08/16/22 191 lb (86.6 kg)    Physical Exam Constitutional:      General: She is not in acute distress.    Appearance: She is not diaphoretic.  Cardiovascular:     Rate and Rhythm: Normal rate and regular rhythm.     Heart sounds: Normal heart  sounds.  Pulmonary:     Effort: Pulmonary effort is normal.     Breath sounds: Normal breath sounds.  Skin:    General: Skin is warm and dry.     Comments: Fingernails bilaterally do look thin and brittle, no apparent pitting  Neurological:     Mental Status: She is alert.      Assessment/Plan: Please see individual problem list.  Primary hypertension Assessment & Plan: Chronic issue.  At goal at home for her age.  She will continue to check her blood pressure at home.  She will continue amlodipine 5 mg daily.   Hyperlipidemia, unspecified hyperlipidemia type Assessment & Plan: Chronic issue.  Continue Lipitor 80 mg daily.  Check labs.  Orders: -     Lipid panel  Gastroesophageal reflux disease without esophagitis Assessment & Plan: Chronic issue.  Patient will continue omeprazole 20 mg daily.   Fingernail abnormalities Assessment & Plan: Plan to check TSH today.  Patient can try biotin over-the-counter.  Discussed avoiding keeping the fingernails wet, using any chemicals on her hands, and avoiding use of nail polish and nail polish removers  Orders: -     TSH  Neuropathy Assessment & Plan: Likely due to her prior chemotherapy.  Only has some numbness in her feet and her fingertips.  Discussed if she had tingling and burning we could potentially use gabapentin or Lyrica though I noted that these medications may not help quite as much for just numbness.  Patient opts against any medication at this time.     Return in about 6 months (around 05/04/2023).   Marikay Alar, MD [facility]

## 2022-11-01 NOTE — Assessment & Plan Note (Signed)
Chronic issue.  Continue Lipitor 80 mg daily.  Check labs. 

## 2022-11-01 NOTE — Assessment & Plan Note (Signed)
Chronic issue.  Patient will continue omeprazole 20 mg daily.

## 2022-11-01 NOTE — Assessment & Plan Note (Signed)
Plan to check TSH today.  Patient can try biotin over-the-counter.  Discussed avoiding keeping the fingernails wet, using any chemicals on her hands, and avoiding use of nail polish and nail polish removers

## 2022-11-01 NOTE — Assessment & Plan Note (Signed)
Likely due to her prior chemotherapy.  Only has some numbness in her feet and her fingertips.  Discussed if she had tingling and burning we could potentially use gabapentin or Lyrica though I noted that these medications may not help quite as much for just numbness.  Patient opts against any medication at this time.

## 2022-11-02 LAB — LIPID PANEL
Cholesterol: 180 mg/dL (ref 0–200)
HDL: 62.6 mg/dL (ref >39.00)
LDL Cholesterol: 91 mg/dL (ref 0–99)
NonHDL: 117.56
Total CHOL/HDL Ratio: 3
Triglycerides: 132 mg/dL (ref 0.0–149.0)
VLDL: 26.4 mg/dL (ref 0.0–40.0)

## 2022-11-02 LAB — TSH: TSH: 1.82 u[IU]/mL (ref 0.35–5.50)

## 2022-11-22 ENCOUNTER — Encounter (INDEPENDENT_AMBULATORY_CARE_PROVIDER_SITE_OTHER): Payer: Self-pay

## 2022-12-15 ENCOUNTER — Ambulatory Visit (INDEPENDENT_AMBULATORY_CARE_PROVIDER_SITE_OTHER): Payer: Medicare Other | Admitting: Family Medicine

## 2022-12-15 ENCOUNTER — Encounter: Payer: Self-pay | Admitting: Family Medicine

## 2022-12-15 VITALS — BP 142/82 | HR 82 | Temp 97.5°F | Ht 65.0 in | Wt 194.2 lb

## 2022-12-15 DIAGNOSIS — R42 Dizziness and giddiness: Secondary | ICD-10-CM | POA: Diagnosis not present

## 2022-12-15 DIAGNOSIS — R053 Chronic cough: Secondary | ICD-10-CM | POA: Diagnosis not present

## 2022-12-15 HISTORY — DX: Chronic cough: R05.3

## 2022-12-15 NOTE — Assessment & Plan Note (Signed)
Possibly related to allergies given some of her symptoms could be allergy related.  Unlikely related to reflux given she reports no reflux.  She will trial Claritin over-the-counter to see if that is beneficial.

## 2022-12-15 NOTE — Progress Notes (Signed)
Marikay Alar, MD Phone: [phone]  Theresa Reid is a 82 y.o. female who presents today for f/u.  Months: Patient notes over the last few weeks she has had an episode of feeling off balance when she just standing there.  It seems as though it is more of a disequilibrium issue based on her report.  She notes no numbness or weakness or vision changes.  She notes no vertiginous symptoms.  Last occurred 2 weeks ago.  Cough: Patient notes dry mild cough since the spring.  She notes occasional mucus in her throat and occasional sneezing.  No rhinorrhea.  No ear fullness.  No reflux symptoms.  She does take omeprazole.  No history of allergies.  Social History   Tobacco Use  Smoking Status Never   Passive exposure: Never  Smokeless Tobacco Never    Current Outpatient Medications on File Prior to Visit  Medication Sig Dispense Refill   acetaminophen (TYLENOL) 325 MG tablet Take 2 tablets (650 mg total) by mouth every 6 (six) hours as needed for mild pain (or Fever >/= 101).     amLODipine (NORVASC) 5 MG tablet TAKE 1 TABLET BY MOUTH DAILY 100 tablet 2   anastrozole (ARIMIDEX) 1 MG tablet TAKE 1 TABLET BY MOUTH DAILY 60 tablet 5   atorvastatin (LIPITOR) 80 MG tablet TAKE 1 TABLET BY MOUTH ONCE  DAILY 100 tablet 2   CALCIUM-VITAMIN D PO Take 1 tablet by mouth daily.      Multiple Vitamins tablet Take 1 tablet by mouth daily.      omeprazole (PRILOSEC) 20 MG capsule TAKE 1 CAPSULE BY MOUTH DAILY 100 capsule 2   oxybutynin (DITROPAN) 5 MG tablet TAKE 1 TABLET BY MOUTH TWICE  DAILY 200 tablet 2   predniSONE (DELTASONE) 5 MG tablet TAKE 1 TO 2 TABLETS BY MOUTH  DAILY AS NEEDED FOR ARTHRITIS  FLARE 90 tablet 0   [DISCONTINUED] prochlorperazine (COMPAZINE) 10 MG tablet Take 1 tablet (10 mg total) by mouth every 6 (six) hours as needed (Nausea or vomiting). 30 tablet 1   No current facility-administered medications on file prior to visit.     ROS see history of present  illness  Objective  Physical Exam Vitals:   12/15/22 1443  BP: (!) 142/82  Pulse: 82  Temp: (!) 97.5 F (36.4 C)  SpO2: 96%    BP Readings from Last 3 Encounters:  12/15/22 (!) 142/82  11/01/22 130/80  10/13/22 (!) 156/84   Wt Readings from Last 3 Encounters:  12/15/22 194 lb 3.2 oz (88.1 kg)  11/01/22 195 lb 3.2 oz (88.5 kg)  10/13/22 194 lb 12.8 oz (88.4 kg)    Physical Exam Constitutional:      General: She is not in acute distress.    Appearance: She is not diaphoretic.  HENT:     Right Ear: Tympanic membrane normal.     Left Ear: Tympanic membrane normal.     Mouth/Throat:     Mouth: Mucous membranes are moist.     Pharynx: Oropharynx is clear.  Cardiovascular:     Rate and Rhythm: Normal rate and regular rhythm.     Heart sounds: Normal heart sounds.  Pulmonary:     Effort: Pulmonary effort is normal.     Breath sounds: Normal breath sounds.  Skin:    General: Skin is warm and dry.  Neurological:     Mental Status: She is alert.     Comments: CN 3-12 intact, 5/5 strength in bilateral biceps,  triceps, grip, quads, hamstrings, plantar and dorsiflexion, sensation to light touch intact in bilateral UE and LE      Assessment/Plan: Please see individual problem list.  Disequilibrium Assessment & Plan: Undetermined cause.  Discussed this could be related to an inner ear issue, sodium issue, or some other underlying intracranial issue.  We will check a sodium level.  Discussed the next step after that would be consideration of seeing ENT versus imaging of her head.  Orders: -     Basic metabolic panel  Chronic cough Assessment & Plan: Possibly related to allergies given some of her symptoms could be allergy related.  Unlikely related to reflux given she reports no reflux.  She will trial Claritin over-the-counter to see if that is beneficial.      Return if symptoms worsen or fail to improve.   Marikay Alar, MD [facility]

## 2022-12-15 NOTE — Assessment & Plan Note (Addendum)
Undetermined cause.  Discussed this could be related to an inner ear issue, sodium issue, or some other underlying intracranial issue.  We will check a sodium level.  Discussed the next step after that would be consideration of seeing ENT versus imaging of her head.

## 2022-12-16 LAB — BASIC METABOLIC PANEL
BUN/Creatinine Ratio: 19 (calc) (ref 6–22)
BUN: 11 mg/dL (ref 7–25)
CO2: 26 mmol/L (ref 20–32)
Calcium: 9.6 mg/dL (ref 8.6–10.4)
Chloride: 103 mmol/L (ref 98–110)
Creat: 0.57 mg/dL — ABNORMAL LOW (ref 0.60–0.95)
Glucose, Bld: 96 mg/dL (ref 65–99)
Potassium: 4.4 mmol/L (ref 3.5–5.3)
Sodium: 141 mmol/L (ref 135–146)

## 2022-12-20 ENCOUNTER — Other Ambulatory Visit: Payer: Self-pay | Admitting: Family Medicine

## 2022-12-20 DIAGNOSIS — R42 Dizziness and giddiness: Secondary | ICD-10-CM

## 2022-12-21 ENCOUNTER — Ambulatory Visit: Payer: Medicare Other | Admitting: Family Medicine

## 2022-12-22 ENCOUNTER — Other Ambulatory Visit: Payer: Self-pay | Admitting: Family Medicine

## 2022-12-22 DIAGNOSIS — S12100A Unspecified displaced fracture of second cervical vertebra, initial encounter for closed fracture: Secondary | ICD-10-CM

## 2022-12-26 ENCOUNTER — Encounter: Payer: Self-pay | Admitting: Family Medicine

## 2022-12-26 ENCOUNTER — Encounter: Payer: Self-pay | Admitting: Oncology

## 2022-12-26 ENCOUNTER — Inpatient Hospital Stay: Payer: Medicare Other | Attending: Oncology | Admitting: Oncology

## 2022-12-26 VITALS — BP 142/82 | HR 79 | Temp 97.3°F | Resp 18 | Ht 65.0 in | Wt 195.4 lb

## 2022-12-26 DIAGNOSIS — Z9221 Personal history of antineoplastic chemotherapy: Secondary | ICD-10-CM | POA: Insufficient documentation

## 2022-12-26 DIAGNOSIS — Z5181 Encounter for therapeutic drug level monitoring: Secondary | ICD-10-CM

## 2022-12-26 DIAGNOSIS — Z79811 Long term (current) use of aromatase inhibitors: Secondary | ICD-10-CM | POA: Insufficient documentation

## 2022-12-26 DIAGNOSIS — Z08 Encounter for follow-up examination after completed treatment for malignant neoplasm: Secondary | ICD-10-CM

## 2022-12-26 DIAGNOSIS — Z923 Personal history of irradiation: Secondary | ICD-10-CM | POA: Diagnosis not present

## 2022-12-26 DIAGNOSIS — C50911 Malignant neoplasm of unspecified site of right female breast: Secondary | ICD-10-CM | POA: Diagnosis not present

## 2022-12-26 DIAGNOSIS — Z170 Estrogen receptor positive status [ER+]: Secondary | ICD-10-CM | POA: Insufficient documentation

## 2022-12-26 DIAGNOSIS — Z853 Personal history of malignant neoplasm of breast: Secondary | ICD-10-CM | POA: Diagnosis not present

## 2022-12-28 ENCOUNTER — Encounter: Payer: Self-pay | Admitting: Oncology

## 2022-12-28 NOTE — Progress Notes (Signed)
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  Patient underwent lumpectomy and sentinel lymph node biopsy in February 2020.  Pathology showed invasive lobular carcinoma with positive medial margin which was reexcised with still positive.  Tumor was 24 mm, grade 2 ER PR positive and HER-2/neu negative.  One sentinel lymph node that was excised was positive for macro met of 8 mm.  No extranodal extension was present.  MammaPrint came back as high risk with an average 10-year risk of untreated disease is 29%.  Patient then underwent a bilateral MRI which showed additional suspicious non-mass enhancement along the inferior margin of the biopsy cavity extending 2 cm highly suspicious for continuous disease and another highly suspicious linear non-mass enhancement at the base of the nipple concerning for multifocal multicentric disease.  Patient underwent right mastectomy and  axillary lymph node dissection on 09/20/2018.  There was no residual invasive mammary carcinoma within the breast.  However 15 out of 20 lymph nodes were involved with metastatic carcinoma measuring up to 6 mm and remaining 5 lymph nodes with isolated tumor cells.  Pathologic stage was [id number]   Despite having surgery in May 2020 which was a second surgery-patient has not been able to start adjuvant chemotherapy.  Her mastectomy was complicated by flap necrosis requiring debridement antibiotics and she had to be taken back to the OR for the same. Given her age and postmastectomy complications and delayed wound healing-weekly Taxol x12 cycles was chosen as the regimen for her adjuvant chemotherapy which she completed on 02/25/2019.   Patient started Arimidex in December 2020 and also received dental clearance to start Zometa.  She completed adjuvant radiation treatment    Interval history- tolerating arimidex well without significant side effects. She is taking her calcium and vit D as well. Denies any breast concerns. Denies any new aches and pains anywhere  ECOG PS- 1 Pain scale- 0   Review of systems- Review of Systems  Constitutional:  Negative for chills, fever, malaise/fatigue and weight loss.  HENT:  Negative for congestion, ear discharge and nosebleeds.   Eyes:  Negative for blurred vision.  Respiratory:  Negative for cough, hemoptysis, sputum production, shortness of breath and wheezing.   Cardiovascular:  Negative for chest pain, palpitations, orthopnea and claudication.  Gastrointestinal:  Negative for abdominal pain, blood in stool, constipation, diarrhea, heartburn, melena, nausea and vomiting.  Genitourinary:  Negative for dysuria, flank pain, frequency, hematuria and urgency.  Musculoskeletal:  Negative for back pain, joint pain and myalgias.  Skin:  Negative for rash.  Neurological:  Negative for dizziness, tingling, focal weakness, seizures, weakness and headaches.   Endo/Heme/Allergies:  Does not bruise/bleed easily.  Psychiatric/Behavioral:  Negative for depression and suicidal ideas. The patient does not have insomnia.       No Known Allergies   Past Medical History:  Diagnosis Date   Arthritis    Breast cancer (HCC)    Diverticulitis    Family history of breast cancer    GERD (gastroesophageal reflux disease)    Hyperlipidemia    Hypertension    Lymphedema    right arm   Personal history of chemotherapy    Personal history of radiation therapy    Pre-diabetes      Past Surgical History:  Procedure Laterality Date   APPLICATION OF INTRAOPERATIVE CT SCAN N/A 11/16/2021   Procedure: APPLICATION OF INTRAOPERATIVE CT SCAN;  Surgeon: Venetia Night, MD;  Location: [facility];  Service: Neurosurgery;  Laterality: N/A;   BREAST BIOPSY Right 05/28/2018   [place] bx, INVASIVE MAMMARY CARCINOMA WITH LOBULAR FEATURES   BREAST LUMPECTOMY Right 06/12/2018   [facility], lobular features   BREAST LUMPECTOMY WITH SENTINEL LYMPH NODE BIOPSY Right 06/12/2018   Procedure: RIGHT BREAST WIDE EXCISION WITH SENTINEL LYMPH NODE BX;  Surgeon: Earline Mayotte, MD;  Location: [facility];  Service: General;  Laterality: Right;   CHOLECYSTECTOMY     GALLBLADDER SURGERY  1997   INCISION AND DRAINAGE OF WOUND Right 10/30/2018   Procedure: IRRIGATION AND DEBRIDEMENT RIGHT CHEST WALL WOUND;  Surgeon: Earline Mayotte, MD;  Location: [facility];  Service: General;  Laterality: Right;   MASTECTOMY Right    MASTECTOMY WITH AXILLARY LYMPH NODE DISSECTION Right 09/20/2018   Procedure: MASTECTOMY WITH AXILLARY LYMPH NODE DISSECTION RIGHT;  Surgeon: Earline Mayotte, MD;  Location: [facility];  Service: General;  Laterality: Right;   ovaraian cyst removal Right    Cyst only (NOT OVARY)   PORTACATH PLACEMENT Left 09/20/2018   Procedure: INSERTION PORT-A-CATH, LEFT;  Surgeon: Earline Mayotte, MD;  Location: [facility];  Service: General;  Laterality: Left;   POSTERIOR CERVICAL  FUSION/FORAMINOTOMY N/A 11/16/2021   Procedure: C1-2 POSTERIOR FUSION WITH ORIF (OPEN REDUCTION INTERNAL FIXATION) C2 FRACTURE (GLOBUS KORE FIBER);  Surgeon: Venetia Night, MD;  Location: [facility];  Service: Neurosurgery;  Laterality: N/A;   TOTAL KNEE ARTHROPLASTY Bilateral 05/05/2010    Social History   Socioeconomic History   Marital status: Single    Spouse name: Not on file   Number of children: Not on file   Years of education: Not on file   Highest education level: Some college, no degree  Occupational History   Occupation: retired  Tobacco Use   Smoking status: Never    Passive exposure: Never   Smokeless tobacco: Never  Vaping Use   Vaping status: Never Used  Substance and Sexual Activity   Alcohol use: No    Alcohol/week: 0.0 standard drinks of alcohol   Drug use: No   Sexual activity: Never  Other Topics Concern   Not on file  Social History Narrative   Retired    Lives by herself    Pets: None   Caffeine- Coffee 2 cups daily, no tea/soda      Social Determinants of Health   Financial Resource Strain: Low Risk  (07/17/2022)   Overall Financial Resource Strain (CARDIA)    Difficulty of Paying Living Expenses: Not hard at all  Food Insecurity: No Food Insecurity (07/17/2022)   Hunger Vital Sign    Worried About Running  Out of Food in the Last Year: Never true    Ran Out of Food in the Last Year: Never true  Transportation Needs: No Transportation Needs (07/17/2022)   PRAPARE - Administrator, Civil Service (Medical): No    Lack of Transportation (Non-Medical): No  Physical Activity: Insufficiently Active (07/17/2022)   Exercise Vital Sign    Days of Exercise per Week: 4 days    Minutes of Exercise per Session: 30 min  Stress: No Stress Concern Present (07/17/2022)   [employer] of Occupational Health - Occupational Stress Questionnaire    Feeling of Stress : Not at all  Social Connections: Socially Isolated (07/17/2022)   Social  Connection and Isolation Panel [NHANES]    Frequency of Communication with Friends and Family: More than three times a week    Frequency of Social Gatherings with Friends and Family: More than three times a week    Attends Religious Services: Never    Database administrator or Organizations: No    Attends Engineer, structural: Not on file    Marital Status: Divorced  Intimate Partner Violence: Not At Risk (05/22/2022)   Humiliation, Afraid, Rape, and Kick questionnaire    Fear of Current or Ex-Partner: No    Emotionally Abused: No    Physically Abused: No    Sexually Abused: No    Family History  Problem Relation Age of Onset   Alcohol abuse Mother        deceased 61   Arthritis Mother    Diabetes Father    Breast cancer Maternal Aunt        dx 38s; deceased 39s   Breast cancer Maternal Aunt        dx 69s; deceased 23s   Arthritis Maternal Grandmother      Current Outpatient Medications:    acetaminophen (TYLENOL) 325 MG tablet, Take 2 tablets (650 mg total) by mouth every 6 (six) hours as needed for mild pain (or Fever >/= 101)., Disp: , Rfl:    amLODipine (NORVASC) 5 MG tablet, TAKE 1 TABLET BY MOUTH DAILY, Disp: 100 tablet, Rfl: 2   anastrozole (ARIMIDEX) 1 MG tablet, TAKE 1 TABLET BY MOUTH DAILY, Disp: 60 tablet, Rfl: 5   atorvastatin (LIPITOR) 80 MG tablet, TAKE 1 TABLET BY MOUTH ONCE  DAILY, Disp: 100 tablet, Rfl: 2   CALCIUM-VITAMIN D PO, Take 1 tablet by mouth daily. , Disp: , Rfl:    Multiple Vitamins tablet, Take 1 tablet by mouth daily. , Disp: , Rfl:    omeprazole (PRILOSEC) 20 MG capsule, TAKE 1 CAPSULE BY MOUTH DAILY, Disp: 100 capsule, Rfl: 2   oxybutynin (DITROPAN) 5 MG tablet, TAKE 1 TABLET BY MOUTH TWICE  DAILY, Disp: 200 tablet, Rfl: 2   predniSONE (DELTASONE) 5 MG tablet, TAKE 1 TO 2 TABLETS BY MOUTH  DAILY AS NEEDED FOR ARTHRITIS  FLARE, Disp: 90 tablet, Rfl: 0  Physical exam:  Vitals:   12/26/22 1405 12/26/22 1417  BP: (!) 154/80 (!) 142/82   Pulse: 77 79  Resp: 18   Temp: (!) 97.3 F (36.3 C)   TempSrc: Tympanic   SpO2: 97%   Weight: 195 lb 6.4 oz (88.6 kg)   Height: 5\' 5"  (1.651 m)    Physical Exam Cardiovascular:     Rate and Rhythm: Normal rate and regular rhythm.     Heart sounds: Normal heart sounds.  Pulmonary:     Effort: Pulmonary effort is normal.  Breath sounds: Normal breath sounds.  Abdominal:     General: Bowel sounds are normal.     Palpations: Abdomen is soft.  Skin:    General: Skin is warm and dry.  Neurological:     Mental Status: She is alert and oriented to person, place, and time.   Breast exam: No palpable masses in the left breast.  No palpable bilateral axillary adenopathy.  Patient is s/p right mastectomy without reconstruction with no evidence of chest wall recurrence.  No palpable bilateral axillary adenopathy.     Latest Ref Rng & Units 12/15/2022    2:18 PM  CMP  Glucose 65 - 99 mg/dL 96   BUN 7 - 25 mg/dL 11   Creatinine 2.70 - 0.95 mg/dL 3.50   Sodium 093 - 818 mmol/L 141   Potassium 3.5 - 5.3 mmol/L 4.4   Chloride 98 - 110 mmol/L 103   CO2 20 - 32 mmol/L 26   Calcium 8.6 - 10.4 mg/dL 9.6       Latest Ref Rng & Units 07/18/2022   11:53 AM  CBC  WBC 4.0 - 10.5 K/uL 7.7   Hemoglobin 12.0 - 15.0 g/dL 29.9   Hematocrit 37.1 - 46.0 % 42.8   Platelets 150.0 - 400.0 K/uL 256.0     No images are attached to the encounter.  No results found.   Assessment and plan- Patient is a 82 y.o. female with history of stage III ER/PR positive HER2 negative right breast cancer s/p mastectomy adjuvant chemotherapy and radiation currently on Arimidex.  She is here for a routine follow-up visit.  Clinically patient is doing well with no concerning signs and symptoms of recurrence based onToday's exam.  She is tolerating Arimidex along with calcium and vitamin D well without any side effects.  Her mammogram from March 2024 was unremarkable.  I will see her back in 6 months no labs.  She  completes 4 years of Arimidex in December 2024.  Her bone density scan from February 2024 was normal   Visit Diagnosis 1. Encounter for follow-up surveillance of breast cancer   2. Visit for monitoring Arimidex therapy      Dr. Owens Shark, MD, MPH [facility] [phone] 12/28/2022 10:13 AM

## 2023-01-02 ENCOUNTER — Ambulatory Visit
Admission: RE | Admit: 2023-01-02 | Discharge: 2023-01-02 | Disposition: A | Discharge status: Home / Self Care | Payer: Medicare Other | Source: Ambulatory Visit | Attending: Family Medicine | Admitting: Family Medicine

## 2023-01-02 DIAGNOSIS — R42 Dizziness and giddiness: Secondary | ICD-10-CM

## 2023-01-02 MED ORDER — GADOBUTROL 1 MMOL/ML IV SOLN: 8.0000 mL | Freq: Once | INTRAVENOUS | Status: DC | PRN

## 2023-01-03 ENCOUNTER — Encounter: Payer: Self-pay | Admitting: Family Medicine

## 2023-01-05 ENCOUNTER — Encounter: Payer: Self-pay | Admitting: Family Medicine

## 2023-01-05 MED ORDER — ALPRAZOLAM 0.5 MG PO TABS: 0.5000 mg | ORAL_TABLET | Freq: Once | ORAL | 0 refills | Status: AC

## 2023-01-12 ENCOUNTER — Ambulatory Visit
Admission: RE | Admit: 2023-01-12 | Discharge: 2023-01-12 | Disposition: A | Discharge status: Home / Self Care | Payer: Medicare Other | Source: Ambulatory Visit | Attending: Family Medicine | Admitting: Family Medicine

## 2023-01-12 DIAGNOSIS — R42 Dizziness and giddiness: Secondary | ICD-10-CM | POA: Insufficient documentation

## 2023-01-12 DIAGNOSIS — I6782 Cerebral ischemia: Secondary | ICD-10-CM | POA: Diagnosis not present

## 2023-01-12 DIAGNOSIS — S12000A Unspecified displaced fracture of first cervical vertebra, initial encounter for closed fracture: Secondary | ICD-10-CM | POA: Diagnosis not present

## 2023-01-12 DIAGNOSIS — S12101A Unspecified nondisplaced fracture of second cervical vertebra, initial encounter for closed fracture: Secondary | ICD-10-CM | POA: Diagnosis not present

## 2023-01-12 MED ORDER — GADOBUTROL 1 MMOL/ML IV SOLN
7.5000 mL | Freq: Once | INTRAVENOUS | Status: AC | PRN
  Administered 2023-01-12: 7.5 mL via INTRAVENOUS

## 2023-01-16 ENCOUNTER — Ambulatory Visit: Payer: Medicare Other

## 2023-01-17 ENCOUNTER — Ambulatory Visit: Payer: Medicare Other

## 2023-01-17 ENCOUNTER — Ambulatory Visit
Admission: RE | Admit: 2023-01-17 | Discharge: 2023-01-17 | Disposition: A | Discharge status: Home / Self Care | Payer: Medicare Other | Source: Ambulatory Visit | Attending: Emergency Medicine | Admitting: Emergency Medicine

## 2023-01-17 VITALS — BP 116/75 | HR 91 | Temp 98.2°F | Resp 18

## 2023-01-17 DIAGNOSIS — R300 Dysuria: Secondary | ICD-10-CM | POA: Diagnosis not present

## 2023-01-17 LAB — POCT URINALYSIS DIP (MANUAL ENTRY)
Bilirubin, UA: NEGATIVE
Blood, UA: NEGATIVE
Glucose, UA: NEGATIVE mg/dL
Ketones, POC UA: NEGATIVE mg/dL
Leukocytes, UA: NEGATIVE
Nitrite, UA: NEGATIVE
Protein Ur, POC: NEGATIVE mg/dL
Spec Grav, UA: <=1.005 — AB (ref 1.010–1.025)
Urobilinogen, UA: 0.2 E.U./dL
pH, UA: 6 (ref 5.0–8.0)

## 2023-01-17 NOTE — Discharge Instructions (Addendum)
Your urine does not show signs of infection today.  Please follow-up with your primary care provider.

## 2023-01-17 NOTE — ED Provider Notes (Signed)
[facility]    CSN: [id number] Arrival date & time: 01/17/23  1330      History   Chief Complaint Chief Complaint  Patient presents with   Urinary Frequency    Possible UTI - Entered by patient    HPI Theresa Reid is a 82 y.o. female.  Patient presents with dysuria and urinary frequency x 3 days.  She denies fever, chills, abdominal pain, flank pain, pelvic pain, or other symptoms.  She has been treating her symptoms with increased water intake.  Her medical history includes breast cancer, GERD, diverticulitis, hypertension, lymphedema, prediabetes arthritis.  The history is provided by the patient and medical records.    Past Medical History:  Diagnosis Date   Arthritis    Breast cancer (HCC)    Diverticulitis    Family history of breast cancer    GERD (gastroesophageal reflux disease)    Hyperlipidemia    Hypertension    Lymphedema    right arm   Personal history of chemotherapy    Personal history of radiation therapy    Pre-diabetes     Patient Active Problem List   Diagnosis Date Noted   Disequilibrium 12/15/2022   Chronic cough 12/15/2022   Fingernail abnormalities 11/01/2022   Neuropathy 11/01/2022   Palpitations 07/18/2022   Trapezius strain 05/03/2022   History of diverticulitis 05/03/2022   Fatigue 01/04/2022   S/P cervical spinal fusion 11/16/2021   Dens fracture (HCC) 11/04/2021   Pain in right shoulder 09/27/2021   Prediabetes 12/16/2020   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   Cataract 09/24/2018   Urge incontinence of urine 09/24/2018   Malignant neoplasm of right female breast (HCC) 06/07/2018   Family history of breast cancer    Dairy product intolerance 01/14/2018   Seborrheic keratosis 04/10/2017   Dysuria 02/19/2017   Visual disturbance 10/03/2016   GERD (gastroesophageal reflux disease) 09/14/2016   HLD (hyperlipidemia) 08/13/2015   Seborrheic dermatitis 07/01/2015   Hypertension 11/13/2014   Osteoarthritis of  multiple joints 11/13/2014    Past Surgical History:  Procedure Laterality Date   APPLICATION OF INTRAOPERATIVE CT SCAN N/A 11/16/2021   Procedure: APPLICATION OF INTRAOPERATIVE CT SCAN;  Surgeon: Venetia Night, MD;  Location: [facility];  Service: Neurosurgery;  Laterality: N/A;   BREAST BIOPSY Right 05/28/2018   [place] bx, INVASIVE MAMMARY CARCINOMA WITH LOBULAR FEATURES   BREAST LUMPECTOMY Right 06/12/2018   [facility], lobular features   BREAST LUMPECTOMY WITH SENTINEL LYMPH NODE BIOPSY Right 06/12/2018   Procedure: RIGHT BREAST WIDE EXCISION WITH SENTINEL LYMPH NODE BX;  Surgeon: Earline Mayotte, MD;  Location: [facility];  Service: General;  Laterality: Right;   CHOLECYSTECTOMY     GALLBLADDER SURGERY  1997   INCISION AND DRAINAGE OF WOUND Right 10/30/2018   Procedure: IRRIGATION AND DEBRIDEMENT RIGHT CHEST WALL WOUND;  Surgeon: Earline Mayotte, MD;  Location: [facility];  Service: General;  Laterality: Right;   MASTECTOMY Right    MASTECTOMY WITH AXILLARY LYMPH NODE DISSECTION Right 09/20/2018   Procedure: MASTECTOMY WITH AXILLARY LYMPH NODE DISSECTION RIGHT;  Surgeon: Earline Mayotte, MD;  Location: [facility];  Service: General;  Laterality: Right;   ovaraian cyst removal Right    Cyst only (NOT OVARY)   PORTACATH PLACEMENT Left 09/20/2018   Procedure: INSERTION PORT-A-CATH, LEFT;  Surgeon: Earline Mayotte, MD;  Location: [facility];  Service: General;  Laterality: Left;   POSTERIOR CERVICAL FUSION/FORAMINOTOMY N/A 11/16/2021   Procedure: C1-2 POSTERIOR FUSION WITH ORIF (OPEN REDUCTION  INTERNAL FIXATION) C2 FRACTURE (GLOBUS KORE FIBER);  Surgeon: Venetia Night, MD;  Location: [facility];  Service: Neurosurgery;  Laterality: N/A;   TOTAL KNEE ARTHROPLASTY Bilateral 05/05/2010    OB History     Gravida  2   Para  2   Term      Preterm      AB      Living         SAB      IAB      Ectopic      Multiple      Live Births           Obstetric Comments  1st  Menstrual Cycle:   10 1st Pregnancy:  20           Home Medications    Prior to Admission medications   Medication Sig Start Date End Date Taking? Authorizing Provider  acetaminophen (TYLENOL) 325 MG tablet Take 2 tablets (650 mg total) by mouth every 6 (six) hours as needed for mild pain (or Fever >/= 101). 11/08/21   Tresa Moore, MD  amLODipine (NORVASC) 5 MG tablet TAKE 1 TABLET BY MOUTH DAILY 05/12/22   Glori Luis, MD  anastrozole (ARIMIDEX) 1 MG tablet TAKE 1 TABLET BY MOUTH DAILY 07/13/22   Creig Hines, MD  atorvastatin (LIPITOR) 80 MG tablet TAKE 1 TABLET BY MOUTH ONCE  DAILY 05/12/22   Glori Luis, MD  CALCIUM-VITAMIN D PO Take 1 tablet by mouth daily.     [provider]  Multiple Vitamins tablet Take 1 tablet by mouth daily.     [provider]  omeprazole (PRILOSEC) 20 MG capsule TAKE 1 CAPSULE BY MOUTH DAILY 08/11/22   Glori Luis, MD  oxybutynin (DITROPAN) 5 MG tablet TAKE 1 TABLET BY MOUTH TWICE  DAILY 05/12/22   Glori Luis, MD  predniSONE (DELTASONE) 5 MG tablet TAKE 1 TO 2 TABLETS BY MOUTH  DAILY AS NEEDED FOR ARTHRITIS  FLARE 08/16/22   Rice, Jamesetta Orleans, MD  prochlorperazine (COMPAZINE) 10 MG tablet Take 1 tablet (10 mg total) by mouth every 6 (six) hours as needed (Nausea or vomiting). 10/10/18 11/18/18  Creig Hines, MD    Family History Family History  Problem Relation Age of Onset   Alcohol abuse Mother        deceased 87   Arthritis Mother    Diabetes Father    Breast cancer Maternal Aunt        dx 7s; deceased 20s   Breast cancer Maternal Aunt        dx 3s; deceased 45s   Arthritis Maternal Grandmother     Social History Social History   Tobacco Use   Smoking status: Never    Passive exposure: Never   Smokeless tobacco: Never  Vaping Use   Vaping status: Never Used  Substance Use Topics   Alcohol use: No    Alcohol/week: 0.0 standard drinks of alcohol   Drug use: No     Allergies    Patient has no known allergies.   Review of Systems Review of Systems  Constitutional:  Negative for chills and fever.  Gastrointestinal:  Negative for abdominal pain, diarrhea and vomiting.  Genitourinary:  Positive for dysuria and frequency. Negative for flank pain, hematuria and pelvic pain.     Physical Exam Triage Vital Signs ED Triage Vitals  Encounter Vitals Group     BP 01/17/23 1405 116/75     Systolic  BP Percentile --      Diastolic BP Percentile --      Pulse Rate 01/17/23 1354 91     Resp 01/17/23 1354 18     Temp 01/17/23 1354 98.2 F (36.8 C)     Temp src --      SpO2 01/17/23 1354 94 %     Weight --      Height --      Head Circumference --      Peak Flow --      Pain Score 01/17/23 1405 0     Pain Loc --      Pain Education --      Exclude from Growth Chart --    No data found.  Updated Vital Signs BP 116/75   Pulse 91   Temp 98.2 F (36.8 C)   Resp 18   SpO2 94%   Visual Acuity Right Eye Distance:   Left Eye Distance:   Bilateral Distance:    Right Eye Near:   Left Eye Near:    Bilateral Near:     Physical Exam Vitals and nursing note reviewed.  Constitutional:      General: She is not in acute distress.    Appearance: She is well-developed.  HENT:     Mouth/Throat:     Mouth: Mucous membranes are moist.  Cardiovascular:     Rate and Rhythm: Normal rate and regular rhythm.     Heart sounds: Normal heart sounds.  Pulmonary:     Effort: Pulmonary effort is normal. No respiratory distress.     Breath sounds: Normal breath sounds.  Abdominal:     General: Bowel sounds are normal.     Palpations: Abdomen is soft.     Tenderness: There is no abdominal tenderness. There is no right CVA tenderness, left CVA tenderness, guarding or rebound.  Musculoskeletal:     Cervical back: Neck supple.  Skin:    General: Skin is warm and dry.  Neurological:     Mental Status: She is alert.      UC Treatments / Results  Labs (all labs  ordered are listed, but only abnormal results are displayed) Labs Reviewed  POCT URINALYSIS DIP (MANUAL ENTRY) - Abnormal; Notable for the following components:      Result Value   Spec Grav, UA <=1.005 (*)    All other components within normal limits    EKG   Radiology No results found.  Procedures Procedures (including critical care time)  Medications Ordered in UC Medications - No data to display  Initial Impression / Assessment and Plan / UC Course  I have reviewed the triage vital signs and the nursing notes.  Pertinent labs & imaging results that were available during my care of the patient were reviewed by me and considered in my medical decision making (see chart for details).    Dysuria.  Urine does not show signs of infection.  Afebrile and vital signs are stable.  Abdomen is soft and nontender with good bowel sounds; no CVAT.  Education provided on dysuria.  Instructed patient to follow up with her PCP if her symptoms are not improving.  She agrees to plan of care.   Final Clinical Impressions(s) / UC Diagnoses   Final diagnoses:  Dysuria     Discharge Instructions      Your urine does not show signs of infection today.  Please follow-up with your primary care provider.     ED Prescriptions  None    PDMP not reviewed this encounter.   Mickie Bail, NP 01/17/23 1426

## 2023-01-17 NOTE — ED Triage Notes (Signed)
Patient to Urgent Care with complaints of urinary frequency/ dysuria that started three days ago.  Denies any known fevers.

## 2023-01-25 ENCOUNTER — Encounter: Payer: Self-pay | Admitting: Family Medicine

## 2023-01-27 ENCOUNTER — Other Ambulatory Visit: Payer: Self-pay | Admitting: Family Medicine

## 2023-01-27 DIAGNOSIS — N3941 Urge incontinence: Secondary | ICD-10-CM

## 2023-01-27 DIAGNOSIS — I10 Essential (primary) hypertension: Secondary | ICD-10-CM

## 2023-02-02 ENCOUNTER — Other Ambulatory Visit: Payer: Self-pay | Admitting: Family Medicine

## 2023-02-02 DIAGNOSIS — D329 Benign neoplasm of meninges, unspecified: Secondary | ICD-10-CM

## 2023-02-12 NOTE — Progress Notes (Unsigned)
Referring Physician:  Glori Luis, MD [address] [place],  [place] [address]  Primary Physician:  Glori Luis, MD  History of Present Illness: 02/15/2023 Theresa Reid is here today with a chief complaint of incidentally found lesion in her brain.  She was suffering from problems with balance and had an MRI scan approximately 1 month ago.  She is currently feeling better.  She is being very careful with her walking.  She has no headaches, nausea, or vomiting.  She has no additional neurologic complaints.  She is still doing well from her neck surgery.   Past Surgery: C1-2 PSF with ORIF on 11/16/21   Review of Systems:  A 10 point review of systems is negative, except for the pertinent positives and negatives detailed in the HPI.  Past Medical History: Past Medical History:  Diagnosis Date   Arthritis    Breast cancer (HCC)    Diverticulitis    Family history of breast cancer    GERD (gastroesophageal reflux disease)    Hyperlipidemia    Hypertension    Lymphedema    right arm   Personal history of chemotherapy    Personal history of radiation therapy    Pre-diabetes     Past Surgical History: Past Surgical History:  Procedure Laterality Date   APPLICATION OF INTRAOPERATIVE CT SCAN N/A 11/16/2021   Procedure: APPLICATION OF INTRAOPERATIVE CT SCAN;  Surgeon: Venetia Night, MD;  Location: [facility];  Service: Neurosurgery;  Laterality: N/A;   BREAST BIOPSY Right 05/28/2018   [place] bx, INVASIVE MAMMARY CARCINOMA WITH LOBULAR FEATURES   BREAST LUMPECTOMY Right 06/12/2018   [facility], lobular features   BREAST LUMPECTOMY WITH SENTINEL LYMPH NODE BIOPSY Right 06/12/2018   Procedure: RIGHT BREAST WIDE EXCISION WITH SENTINEL LYMPH NODE BX;  Surgeon: Earline Mayotte, MD;  Location: [facility];  Service: General;  Laterality: Right;   CHOLECYSTECTOMY     GALLBLADDER SURGERY  1997   INCISION AND DRAINAGE OF WOUND Right 10/30/2018   Procedure:  IRRIGATION AND DEBRIDEMENT RIGHT CHEST WALL WOUND;  Surgeon: Earline Mayotte, MD;  Location: [facility];  Service: General;  Laterality: Right;   MASTECTOMY Right    MASTECTOMY WITH AXILLARY LYMPH NODE DISSECTION Right 09/20/2018   Procedure: MASTECTOMY WITH AXILLARY LYMPH NODE DISSECTION RIGHT;  Surgeon: Earline Mayotte, MD;  Location: [facility];  Service: General;  Laterality: Right;   ovaraian cyst removal Right    Cyst only (NOT OVARY)   PORTACATH PLACEMENT Left 09/20/2018   Procedure: INSERTION PORT-A-CATH, LEFT;  Surgeon: Earline Mayotte, MD;  Location: [facility];  Service: General;  Laterality: Left;   POSTERIOR CERVICAL FUSION/FORAMINOTOMY N/A 11/16/2021   Procedure: C1-2 POSTERIOR FUSION WITH ORIF (OPEN REDUCTION INTERNAL FIXATION) C2 FRACTURE (GLOBUS KORE FIBER);  Surgeon: Venetia Night, MD;  Location: [facility];  Service: Neurosurgery;  Laterality: N/A;   TOTAL KNEE ARTHROPLASTY Bilateral 05/05/2010    Allergies: Allergies as of 02/15/2023   (No Known Allergies)    Medications:  Current Outpatient Medications:    acetaminophen (TYLENOL) 325 MG tablet, Take 2 tablets (650 mg total) by mouth every 6 (six) hours as needed for mild pain (or Fever >/= 101)., Disp: , Rfl:    amLODipine (NORVASC) 5 MG tablet, TAKE 1 TABLET BY MOUTH DAILY, Disp: 100 tablet, Rfl: 2   anastrozole (ARIMIDEX) 1 MG tablet, TAKE 1 TABLET BY MOUTH DAILY, Disp: 60 tablet, Rfl: 5   atorvastatin (LIPITOR) 80 MG tablet, TAKE 1  TABLET BY MOUTH ONCE  DAILY, Disp: 100 tablet, Rfl: 2   CALCIUM-VITAMIN D PO, Take 1 tablet by mouth daily. , Disp: , Rfl:    Multiple Vitamins tablet, Take 1 tablet by mouth daily. , Disp: , Rfl:    omeprazole (PRILOSEC) 20 MG capsule, TAKE 1 CAPSULE BY MOUTH DAILY, Disp: 100 capsule, Rfl: 2   oxybutynin (DITROPAN) 5 MG tablet, TAKE 1 TABLET BY MOUTH TWICE  DAILY, Disp: 200 tablet, Rfl: 2   predniSONE (DELTASONE) 5 MG tablet, TAKE 1 TO 2 TABLETS BY MOUTH  DAILY AS NEEDED FOR  ARTHRITIS  FLARE, Disp: 90 tablet, Rfl: 0  Social History: Social History   Tobacco Use   Smoking status: Never    Passive exposure: Never   Smokeless tobacco: Never  Vaping Use   Vaping status: Never Used  Substance Use Topics   Alcohol use: No    Alcohol/week: 0.0 standard drinks of alcohol   Drug use: No    Family Medical History: Family History  Problem Relation Age of Onset   Alcohol abuse Mother        deceased 66   Arthritis Mother    Diabetes Father    Breast cancer Maternal Aunt        dx 76s; deceased 20s   Breast cancer Maternal Aunt        dx 83s; deceased 60s   Arthritis Maternal Grandmother     Physical Examination: Vitals:   02/15/23 1304  BP: (!) 140/78    General: Patient is in no apparent distress. Attention to examination is appropriate.  Respiratory: Patient is breathing without any difficulty.   NEUROLOGICAL:     Awake, alert, oriented to person, place, and time.  Speech is clear and fluent.   Cranial Nerves: Pupils equal round and reactive to light.  Facial tone is symmetric.  Facial sensation is symmetric. Shoulder shrug is symmetric. Tongue protrusion is midline.  There is no pronator drift.  Strength: MAEW  Bilateral upper and lower extremity sensation is intact to light touch.    No evidence of dysmetria noted.  Gait is abnormal - requires cane.     Medical Decision Making  Imaging: MRI Brain 01/12/2023 IMPRESSION: 1. No acute brain finding. Mild chronic small-vessel ischemic change of the cerebral hemispheric white matter, often seen at this age. 2. 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 No significant mass-effect upon the cerebellum. 3. Previous ORIF for treatment of C1 and C2 fractures. No apparent compromise of the foramen magnum or upper cervical spinal canal.     Electronically Signed   By: Paulina Fusi M.D.    On: 01/31/2023 15:35  I have personally reviewed the images and agree with the above interpretation.  Assessment and Plan: Theresa Reid is a pleasant 82 y.o. female with small posterior fossa lesion consistent with meningioma.  I have reviewed her CT scans from 2018 and 2023.  In both of those prior CT scans, there is a lesion in the area of this tumor.  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  We reviewed the management recommendations.  I will repeat her scan in 6 months.  Will then follow with serial imaging.  Thank you for involving me in the care of this patient.      Abdel Effinger K. Myer Haff MD, [facility] Neurosurgery

## 2023-02-15 ENCOUNTER — Encounter: Payer: Self-pay | Admitting: Neurosurgery

## 2023-02-15 ENCOUNTER — Ambulatory Visit: Payer: Medicare Other | Admitting: Neurosurgery

## 2023-02-15 VITALS — BP 140/78 | Ht 63.0 in | Wt 195.4 lb

## 2023-02-15 DIAGNOSIS — D329 Benign neoplasm of meninges, unspecified: Secondary | ICD-10-CM

## 2023-02-21 ENCOUNTER — Ambulatory Visit: Payer: Medicare Other | Admitting: Family Medicine

## 2023-03-06 ENCOUNTER — Ambulatory Visit: Payer: Medicare Other | Attending: Family Medicine | Admitting: Occupational Therapy

## 2023-03-06 ENCOUNTER — Encounter: Payer: Self-pay | Admitting: Occupational Therapy

## 2023-03-06 DIAGNOSIS — I972 Postmastectomy lymphedema syndrome: Secondary | ICD-10-CM | POA: Diagnosis not present

## 2023-03-06 NOTE — Therapy (Signed)
[facility] [address] [place], [address] Phone: [phone]   Fax:  [phone]  Occupational Therapy Treatment/Reeval  Patient Details  Name: Theresa Reid MRN: [id number] Date of Birth: 02-13-1941 No data recorded  Encounter Date: 03/06/2023   OT End of Session - 03/06/23 1824     Visit Number 20    Number of Visits 22    Date for OT Re-Evaluation 05/01/23    OT Start Time 1401    OT Stop Time 1435    OT Time Calculation (min) 34 min    Activity Tolerance Patient tolerated treatment well    Behavior During Therapy [facility] for tasks assessed/performed             Past Medical History:  Diagnosis Date   Arthritis    Breast cancer (HCC)    Diverticulitis    Family history of breast cancer    GERD (gastroesophageal reflux disease)    Hyperlipidemia    Hypertension    Lymphedema    right arm   Personal history of chemotherapy    Personal history of radiation therapy    Pre-diabetes     Past Surgical History:  Procedure Laterality Date   APPLICATION OF INTRAOPERATIVE CT SCAN N/A 11/16/2021   Procedure: APPLICATION OF INTRAOPERATIVE CT SCAN;  Surgeon: Venetia Night, MD;  Location: [facility];  Service: Neurosurgery;  Laterality: N/A;   BREAST BIOPSY Right 05/28/2018   [place] bx, INVASIVE MAMMARY CARCINOMA WITH LOBULAR FEATURES   BREAST LUMPECTOMY Right 06/12/2018   [facility], lobular features   BREAST LUMPECTOMY WITH SENTINEL LYMPH NODE BIOPSY Right 06/12/2018   Procedure: RIGHT BREAST WIDE EXCISION WITH SENTINEL LYMPH NODE BX;  Surgeon: Earline Mayotte, MD;  Location: [facility];  Service: General;  Laterality: Right;   CHOLECYSTECTOMY     GALLBLADDER SURGERY  1997   INCISION AND DRAINAGE OF WOUND Right 10/30/2018   Procedure: IRRIGATION AND DEBRIDEMENT RIGHT CHEST WALL WOUND;  Surgeon: Earline Mayotte, MD;  Location: [facility];  Service: General;  Laterality: Right;   MASTECTOMY Right    MASTECTOMY  WITH AXILLARY LYMPH NODE DISSECTION Right 09/20/2018   Procedure: MASTECTOMY WITH AXILLARY LYMPH NODE DISSECTION RIGHT;  Surgeon: Earline Mayotte, MD;  Location: [facility];  Service: General;  Laterality: Right;   ovaraian cyst removal Right    Cyst only (NOT OVARY)   PORTACATH PLACEMENT Left 09/20/2018   Procedure: INSERTION PORT-A-CATH, LEFT;  Surgeon: Earline Mayotte, MD;  Location: [facility];  Service: General;  Laterality: Left;   POSTERIOR CERVICAL FUSION/FORAMINOTOMY N/A 11/16/2021   Procedure: C1-2 POSTERIOR FUSION WITH ORIF (OPEN REDUCTION INTERNAL FIXATION) C2 FRACTURE (GLOBUS KORE FIBER);  Surgeon: Venetia Night, MD;  Location: [facility];  Service: Neurosurgery;  Laterality: N/A;   TOTAL KNEE ARTHROPLASTY Bilateral 05/05/2010    There were no vitals filed for this visit.   Subjective Assessment - 03/06/23 1823     Subjective  Had been wearing the sleeve and glove since my.  Doing okay.  Shoulder and neck bothers me a little bit.    Pertinent History Patient had her original lumpectomy and sentinel lymph node biopsy back in February 2020 R breast   She then had an MRI which showed more areas of enhancement and was taken back to the OR for mastectomy and axillary lymph node dissection on 09/20/2018 which showed 15 of the lymph nodes were also positive for malignancy and this upstaged her to stage IIIa  disease.  Plan was to offer her adjuvant chemotherapy following this.  However her mastectomy course was complicated by flap necrosis requiring debridement antibiotics and she had to be also taken to the OR on 10/30/2018.  Her wound did not completely healed  and she still has a drain in place.  She seen  Dr. Everlene Farrier on 11/18/2018.  There is concern for early cellulitis of her chest wall.  Dr. Everlene Farrier after his visit started  patient on Augmentin for 2 more weeks which further delays her chemotherapy. Lymphedema in R UE per pt developed after surgery on 10/30/2018 - stayed the same with  compression on R UE and use of jovipak breast pad  - denies pain - pt comes every about 5-7 months for re assessent and getting replacement for her daytime compression sleeve- was seen March 23 and did not get replacement - pt fell about 2 months ago and ended up with neck fx and had cervcal fusion of C1 and C2 about 6 wks ago    Patient Stated Goals I want to see if need new compression sleeve to prevent lymphedema to increase and infection    Currently in Pain? No/denies                 LYMPHEDEMA/ONCOLOGY QUESTIONNAIRE - 03/06/23 0001       Right Upper Extremity Lymphedema   15 cm Proximal to Olecranon Process 39 cm    10 cm Proximal to Olecranon Process 38.8 cm    Olecranon Process 25.6 cm    15 cm Proximal to Ulnar Styloid Process 21.5 cm    10 cm Proximal to Ulnar Styloid Process 19 cm    Just Proximal to Ulnar Styloid Process 14.7 cm    Across Hand at [facility] 17.5 cm      Left Upper Extremity Lymphedema   15 cm Proximal to Olecranon Process 41.5 cm    10 cm Proximal to Olecranon Process 39 cm    Olecranon Process 26.5 cm    15 cm Proximal to Ulnar Styloid Process 21.5 cm    10 cm Proximal to Ulnar Styloid Process 19 cm    Just Proximal to Ulnar Styloid Process 15 cm    Across Hand at [facility] 18 cm             Patient arrives with right Jobst Elvarex soft custom compression sleeve and glove.  Patient had this one since May 24 and her glove since last year. She is wearing her compression daily and doing well and able to maintain her circumference in the right upper extremity for years. Pt has  left CMC thumb arthritis as well as history of cervical fracture.  Reviewed with patient again technique to don her compression sleeve pushing thru her palm on table or in supine to ease  donning. Patient was fitted with a CMC neoprene wrap  in the past to use on left hand with donning of compression sleeve. Report helping a lot. Lymphedema measurements within  normal limits compared to her left upper extremity and prior measurements. Pt in need for new compression sleeve and glove. Patient to contact Rep - [employer] -and follow up with me again when she gets her compression sleeve and glove .                        OT Education - 03/06/23 1824     Education Details Wearing compression sleeve and garments  Person(s) Educated Patient    Methods Explanation;Demonstration;Tactile cues;Verbal cues;Handout    Comprehension Returned demonstration;Verbalized understanding              OT Short Term Goals - 07/11/21 1502       OT SHORT TERM GOAL #1   Title Pt's R UE circumference decrease by at least 1cm -2 cm from hand to upper arm - to be fitted for appropriate compression garments    Status Achieved               OT Long Term Goals - 03/06/23 1826       OT LONG TERM GOAL #2   Title Pt maintain her R UE and thoracic lymphedema  circumference with correct compression garments and HEP    Baseline Pt cont to wear  Jobst  Elvarex soft compression sleeve with glove -as well as jovipak breast pad- measurements WNL compare to L and in past- pt in need for new compression sleeve    Time 8    Period Weeks    Status New    Target Date 05/01/23                   Plan - 03/06/23 1825     Clinical Impression Statement Pt with diagnosis of R UE lymphedema - she done great thru the years to maintain her R UE circumference with wearing daily Jobst Elvarex sleeve and glove and  her Unilateral post mastectomy jovipak breast pad as needed- was fitted in Nov 23 last time. . Circumference in R UE compare to the L  and in the past  WNL using Jobst Elvarex soft sleeve and glove. Pt in need for new sleeve and glovel. Recommend for her to use her Jovipak breastpad  as needed night time or day time to  clear thoracic lymphedema. Pt get with [employer] about getting measured for new sleeve and glove -and contact me when  she gets to to assess fit and review homeprogram. Pt can benefit from skilled OT services to assess fit of new compression and review homeprogram including donning to decrease strain on cervical and shoulder.    OT Occupational Profile and History Problem Focused Assessment - Including review of records relating to presenting problem    Occupational performance deficits (Please refer to evaluation for details): ADL's;Play;Leisure;IADL's    Body Structure / Function / Physical Skills ADL;UE functional use;Scar mobility;Skin integrity;Edema    Rehab Potential Good    Clinical Decision Making Several treatment options, min-mod task modification necessary    Comorbidities Affecting Occupational Performance: May have comorbidities impacting occupational performance    Modification or Assistance to Complete Evaluation  No modification of tasks or assist necessary to complete eval    OT Frequency --   2 visits   OT Duration 8 weeks    OT Treatment/Interventions Self-care/ADL training;Manual lymph drainage;Therapeutic exercise;Patient/family education;Manual Therapy;Compression bandaging    Consulted and Agree with Plan of Care Patient             Patient will benefit from skilled therapeutic intervention in order to improve the following deficits and impairments:   Body Structure / Function / Physical Skills: ADL, UE functional use, Scar mobility, Skin integrity, Edema       Visit Diagnosis: Postmastectomy lymphedema syndrome    Problem List Patient Active Problem List   Diagnosis Date Noted   Disequilibrium 12/15/2022   Chronic cough 12/15/2022   Fingernail abnormalities 11/01/2022   Neuropathy 11/01/2022  Palpitations 07/18/2022   Trapezius strain 05/03/2022   History of diverticulitis 05/03/2022   Fatigue 01/04/2022   S/P cervical spinal fusion 11/16/2021   Dens fracture (HCC) 11/04/2021   Pain in right shoulder 09/27/2021   Prediabetes 12/16/2020   History of polymyalgia  rheumatica 10/01/2020   Cataract 09/24/2018   Urge incontinence of urine 09/24/2018   Malignant neoplasm of right female breast (HCC) 06/07/2018   Family history of breast cancer    Dairy product intolerance 01/14/2018   Seborrheic keratosis 04/10/2017   Dysuria 02/19/2017   Visual disturbance 10/03/2016   GERD (gastroesophageal reflux disease) 09/14/2016   HLD (hyperlipidemia) 08/13/2015   Seborrheic dermatitis 07/01/2015   Hypertension 11/13/2014   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    Oletta Cohn, OTR/L,CLT 03/06/2023, 6:33 PM  [facility] [address], [place], [address] Phone: [phone]   Fax:  [phone]  Name: Theresa Reid MRN: [id number] Date of Birth: [birth year]

## 2023-03-19 ENCOUNTER — Ambulatory Visit (INDEPENDENT_AMBULATORY_CARE_PROVIDER_SITE_OTHER): Payer: Medicare Other | Admitting: Family Medicine

## 2023-03-19 ENCOUNTER — Encounter: Payer: Self-pay | Admitting: Family Medicine

## 2023-03-19 VITALS — BP 126/82 | HR 89 | Temp 98.3°F | Ht 63.0 in | Wt 196.0 lb

## 2023-03-19 DIAGNOSIS — K5792 Diverticulitis of intestine, part unspecified, without perforation or abscess without bleeding: Secondary | ICD-10-CM | POA: Insufficient documentation

## 2023-03-19 LAB — CBC WITH DIFFERENTIAL/PLATELET
Basophils Absolute: 0.1 10*3/uL (ref 0.0–0.1)
Basophils Relative: 1 % (ref 0.0–3.0)
Eosinophils Absolute: 0.1 10*3/uL (ref 0.0–0.7)
Eosinophils Relative: 1.9 % (ref 0.0–5.0)
HCT: 42.4 % (ref 36.0–46.0)
Hemoglobin: 13.8 g/dL (ref 12.0–15.0)
Lymphocytes Relative: 29.8 % (ref 12.0–46.0)
Lymphs Abs: 2.3 10*3/uL (ref 0.7–4.0)
MCHC: 32.6 g/dL (ref 30.0–36.0)
MCV: 90.4 fL (ref 78.0–100.0)
Monocytes Absolute: 0.8 10*3/uL (ref 0.1–1.0)
Monocytes Relative: 10.3 % (ref 3.0–12.0)
Neutro Abs: 4.4 10*3/uL (ref 1.4–7.7)
Neutrophils Relative %: 57 % (ref 43.0–77.0)
Platelets: 229 10*3/uL (ref 150.0–400.0)
RBC: 4.68 Mil/uL (ref 3.87–5.11)
RDW: 15.3 % (ref 11.5–15.5)
WBC: 7.6 10*3/uL (ref 4.0–10.5)

## 2023-03-19 LAB — COMPREHENSIVE METABOLIC PANEL
ALT: 13 U/L (ref 0–35)
AST: 19 U/L (ref 0–37)
Albumin: 4 g/dL (ref 3.5–5.2)
Alkaline Phosphatase: 103 U/L (ref 39–117)
BUN: 9 mg/dL (ref 6–23)
CO2: 28 meq/L (ref 19–32)
Calcium: 9.8 mg/dL (ref 8.4–10.5)
Chloride: 103 meq/L (ref 96–112)
Creatinine, Ser: 0.56 mg/dL (ref 0.40–1.20)
GFR: 84.11 mL/min (ref >60.00)
Glucose, Bld: 121 mg/dL — ABNORMAL HIGH (ref 70–99)
Potassium: 3.9 meq/L (ref 3.5–5.1)
Sodium: 141 meq/L (ref 135–145)
Total Bilirubin: 0.4 mg/dL (ref 0.2–1.2)
Total Protein: 7 g/dL (ref 6.0–8.3)

## 2023-03-19 MED ORDER — AMOXICILLIN-POT CLAVULANATE 875-125 MG PO TABS: 1.0000 | ORAL_TABLET | Freq: Three times a day (TID) | ORAL | 0 refills | Status: DC

## 2023-03-19 NOTE — Assessment & Plan Note (Signed)
Symptoms and exam are consistent with diverticulitis given exam and history.  She does feel some better today and has not had any diarrhea.  Will treat with Augmentin 1 tablet every 8 hours for 10 days.  Advised to monitor for worsening diarrhea.  If she develops severe abdominal pain, fevers, or worsening symptoms she will be reevaluated.  Discussed the potential need for CT scan if not improving with empiric therapy.

## 2023-03-19 NOTE — Progress Notes (Signed)
Marikay Alar, MD Phone: [phone]  Theresa Reid is a 82 y.o. female who presents today for same-day visit.  Diarrhea/abdominal discomfort: Patient notes this has been going on a week.  She has had some nausea.  She has had 2-3 episodes of diarrhea each day.  She notes it is a little better today and she has not had a bowel movement today.  Notes typically bowel movements are in the morning and that she feels better the rest of the day.  Notes her abdomen feels bruised.  She does report a history of diverticulitis and notes this is typically how it presents.  Notes no dysuria, urinary frequency, or vaginal discharge.  Social History   Tobacco Use  Smoking Status Never   Passive exposure: Never  Smokeless Tobacco Never    Current Outpatient Medications on File Prior to Visit  Medication Sig Dispense Refill   acetaminophen (TYLENOL) 325 MG tablet Take 2 tablets (650 mg total) by mouth every 6 (six) hours as needed for mild pain (or Fever >/= 101).     amLODipine (NORVASC) 5 MG tablet TAKE 1 TABLET BY MOUTH DAILY 100 tablet 2   anastrozole (ARIMIDEX) 1 MG tablet TAKE 1 TABLET BY MOUTH DAILY 60 tablet 5   atorvastatin (LIPITOR) 80 MG tablet TAKE 1 TABLET BY MOUTH ONCE  DAILY 100 tablet 2   CALCIUM-VITAMIN D PO Take 1 tablet by mouth daily.      Multiple Vitamins tablet Take 1 tablet by mouth daily.      omeprazole (PRILOSEC) 20 MG capsule TAKE 1 CAPSULE BY MOUTH DAILY 100 capsule 2   oxybutynin (DITROPAN) 5 MG tablet TAKE 1 TABLET BY MOUTH TWICE  DAILY 200 tablet 2   predniSONE (DELTASONE) 5 MG tablet TAKE 1 TO 2 TABLETS BY MOUTH  DAILY AS NEEDED FOR ARTHRITIS  FLARE 90 tablet 0   [DISCONTINUED] prochlorperazine (COMPAZINE) 10 MG tablet Take 1 tablet (10 mg total) by mouth every 6 (six) hours as needed (Nausea or vomiting). 30 tablet 1   No current facility-administered medications on file prior to visit.     ROS see history of present illness  Objective  Physical  Exam Vitals:   03/19/23 1255  BP: 126/82  Pulse: 89  Temp: 98.3 F (36.8 C)  SpO2: 95%    BP Readings from Last 3 Encounters:  03/19/23 126/82  02/15/23 (!) 140/78  01/17/23 116/75   Wt Readings from Last 3 Encounters:  03/19/23 196 lb (88.9 kg)  02/15/23 195 lb 6.4 oz (88.6 kg)  12/26/22 195 lb 6.4 oz (88.6 kg)    Physical Exam Constitutional:      General: She is not in acute distress.    Appearance: She is not diaphoretic.  Pulmonary:     Effort: Pulmonary effort is normal.  Abdominal:     General: Bowel sounds are normal. There is no distension.     Palpations: Abdomen is soft.     Tenderness: There is abdominal tenderness (Left lower quadrant tenderness). There is no guarding.  Skin:    General: Skin is warm and dry.  Neurological:     Mental Status: She is alert.      Assessment/Plan: Please see individual problem list.  Diverticulitis Assessment & Plan: Symptoms and exam are consistent with diverticulitis given exam and history.  She does feel some better today and has not had any diarrhea.  Will treat with Augmentin 1 tablet every 8 hours for 10 days.  Advised to monitor for  worsening diarrhea.  If she develops severe abdominal pain, fevers, or worsening symptoms she will be reevaluated.  Discussed the potential need for CT scan if not improving with empiric therapy.  Orders: -     Amoxicillin-Pot Clavulanate; Take 1 tablet by mouth every 8 (eight) hours.  Dispense: 30 tablet; Refill: 0 -     CBC with Differential/Platelet -     Comprehensive metabolic panel     Return if symptoms worsen or fail to improve.   Marikay Alar, MD [facility]

## 2023-03-19 NOTE — Patient Instructions (Signed)
Nice to see you. We will start you on Augmentin to treat the likely diverticulitis.  If you are not improving please let us know.  If you develop fever, worsening abdominal pain, or worsening other symptoms please go to the emergency room for evaluation. Will contact you with your lab results.

## 2023-03-21 ENCOUNTER — Telehealth: Payer: Self-pay | Admitting: Family Medicine

## 2023-03-21 NOTE — Telephone Encounter (Signed)
[employer] called and said they need clinical notes that supports the diagnosis of the patient. Their number is [phone]. She said it can be faxed to them. She said they have received everything else they need. They only need the clinical notes.

## 2023-03-22 ENCOUNTER — Encounter: Payer: Self-pay | Admitting: Emergency Medicine

## 2023-03-22 ENCOUNTER — Emergency Department: Payer: Medicare Other

## 2023-03-22 ENCOUNTER — Other Ambulatory Visit: Payer: Self-pay

## 2023-03-22 ENCOUNTER — Emergency Department
Admission: EM | Admit: 2023-03-22 | Discharge: 2023-03-22 | Disposition: A | Discharge status: Home / Self Care | Payer: Medicare Other | Attending: Emergency Medicine | Admitting: Emergency Medicine

## 2023-03-22 DIAGNOSIS — R1032 Left lower quadrant pain: Secondary | ICD-10-CM | POA: Diagnosis not present

## 2023-03-22 DIAGNOSIS — R197 Diarrhea, unspecified: Secondary | ICD-10-CM | POA: Insufficient documentation

## 2023-03-22 DIAGNOSIS — R109 Unspecified abdominal pain: Secondary | ICD-10-CM | POA: Diagnosis not present

## 2023-03-22 DIAGNOSIS — I10 Essential (primary) hypertension: Secondary | ICD-10-CM | POA: Diagnosis not present

## 2023-03-22 DIAGNOSIS — K5730 Diverticulosis of large intestine without perforation or abscess without bleeding: Secondary | ICD-10-CM | POA: Diagnosis not present

## 2023-03-22 DIAGNOSIS — K5792 Diverticulitis of intestine, part unspecified, without perforation or abscess without bleeding: Secondary | ICD-10-CM | POA: Diagnosis not present

## 2023-03-22 DIAGNOSIS — Z853 Personal history of malignant neoplasm of breast: Secondary | ICD-10-CM | POA: Diagnosis not present

## 2023-03-22 LAB — CBC WITH DIFFERENTIAL/PLATELET
Abs Immature Granulocytes: 0.02 10*3/uL (ref 0.00–0.07)
Basophils Absolute: 0 10*3/uL (ref 0.0–0.1)
Basophils Relative: 0 %
Eosinophils Absolute: 0.1 10*3/uL (ref 0.0–0.5)
Eosinophils Relative: 2 %
HCT: 42.4 % (ref 36.0–46.0)
Hemoglobin: 14 g/dL (ref 12.0–15.0)
Immature Granulocytes: 0 %
Lymphocytes Relative: 24 %
Lymphs Abs: 1.7 10*3/uL (ref 0.7–4.0)
MCH: 29.4 pg (ref 26.0–34.0)
MCHC: 33 g/dL (ref 30.0–36.0)
MCV: 89.1 fL (ref 80.0–100.0)
Monocytes Absolute: 0.6 10*3/uL (ref 0.1–1.0)
Monocytes Relative: 8 %
Neutro Abs: 4.8 10*3/uL (ref 1.7–7.7)
Neutrophils Relative %: 66 %
Platelets: 236 10*3/uL (ref 150–400)
RBC: 4.76 MIL/uL (ref 3.87–5.11)
RDW: 14.6 % (ref 11.5–15.5)
WBC: 7.2 10*3/uL (ref 4.0–10.5)
nRBC: 0 % (ref 0.0–0.2)

## 2023-03-22 LAB — URINALYSIS, ROUTINE W REFLEX MICROSCOPIC
Bacteria, UA: NONE SEEN
Bilirubin Urine: NEGATIVE
Glucose, UA: NEGATIVE mg/dL
Ketones, ur: NEGATIVE mg/dL
Leukocytes,Ua: NEGATIVE
Nitrite: NEGATIVE
Protein, ur: NEGATIVE mg/dL
Specific Gravity, Urine: 1.021 (ref 1.005–1.030)
pH: 6 (ref 5.0–8.0)

## 2023-03-22 LAB — COMPREHENSIVE METABOLIC PANEL
ALT: 17 U/L (ref 0–44)
AST: 22 U/L (ref 15–41)
Albumin: 3.9 g/dL (ref 3.5–5.0)
Alkaline Phosphatase: 107 U/L (ref 38–126)
Anion gap: 11 (ref 5–15)
BUN: 11 mg/dL (ref 8–23)
CO2: 24 mmol/L (ref 22–32)
Calcium: 9.4 mg/dL (ref 8.9–10.3)
Chloride: 105 mmol/L (ref 98–111)
Creatinine, Ser: 0.65 mg/dL (ref 0.44–1.00)
GFR, Estimated: >60 mL/min (ref >60)
Glucose, Bld: 120 mg/dL — ABNORMAL HIGH (ref 70–99)
Potassium: 3.6 mmol/L (ref 3.5–5.1)
Sodium: 140 mmol/L (ref 135–145)
Total Bilirubin: 0.7 mg/dL (ref <1.2)
Total Protein: 7.2 g/dL (ref 6.5–8.1)

## 2023-03-22 LAB — LIPASE, BLOOD: Lipase: 29 U/L (ref 11–51)

## 2023-03-22 MED ORDER — IOHEXOL 300 MG/ML  SOLN
100.0000 mL | Freq: Once | INTRAMUSCULAR | Status: AC | PRN
  Administered 2023-03-22: 100 mL via INTRAVENOUS

## 2023-03-22 NOTE — ED Notes (Signed)
Pt provided discharge instructions and prescription information. Pt was given the opportunity to ask questions and questions were answered.   

## 2023-03-22 NOTE — ED Provider Notes (Signed)
[facility] Provider Note    Event Date/Time   First MD Initiated Contact with Patient 03/22/23 [phone]     (approximate)   History   Abdominal Pain   HPI  Theresa Reid is a 82 y.o. female who presents today for evaluation of diarrhea for the past several days.  Patient reports that she thought that she had a diverticulitis flare so she went to see her primary care provider who started her on Augmentin.  She reports that she felt better for the first day, but then the diarrhea returned.  She denies any pain in her belly except for mild crampiness when she has to move her bowels.  She has not noticed any blood in her bowels.  She has not had any nausea or vomiting.  She does report that she feels gassy.  She has not had any fevers or chills.  No urinary symptoms.  She has been able to eat and drink.  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   Cataract 09/24/2018   Urge incontinence of urine 09/24/2018   Malignant neoplasm of right female breast (HCC) 06/07/2018   Family history of breast cancer    Dairy product intolerance 01/14/2018   Seborrheic keratosis 04/10/2017   Dysuria 02/19/2017   Visual disturbance 10/03/2016   GERD (gastroesophageal reflux disease) 09/14/2016   HLD (hyperlipidemia) 08/13/2015   Seborrheic dermatitis 07/01/2015   Hypertension 11/13/2014   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          Physical Exam   Triage Vital Signs: ED Triage Vitals  Encounter Vitals Group      BP 03/22/23 0628 130/78     Systolic BP Percentile --      Diastolic BP Percentile --      Pulse Rate 03/22/23 0628 (!) 120     Resp 03/22/23 0628 18     Temp 03/22/23 0628 98.5 F (36.9 C)     Temp src --      SpO2 03/22/23 0628 95 %     Weight 03/22/23 0623 194 lb (88 kg)     Height 03/22/23 0623 5\' 5"  (1.651 m)     Head Circumference --      Peak Flow --      Pain Score 03/22/23 0623 2     Pain Loc --      Pain Education --      Exclude from Growth Chart --     Most recent vital signs: Vitals:   03/22/23 0628 03/22/23 0934  BP: 130/78 (!) 153/69  Pulse: (!) 120 87  Resp: 18 18  Temp: 98.5 F (36.9 C) 98.4 F (36.9 C)  SpO2: 95% 94%    Physical Exam Vitals and nursing note reviewed.  Constitutional:      General: Awake and alert. No acute distress.    Appearance: Normal appearance. The patient is obese HENT:     Head: Normocephalic and atraumatic.     Mouth: Mucous membranes are moist.  Eyes:     General: PERRL. Normal EOMs  Right eye: No discharge.        Left eye: No discharge.     Conjunctiva/sclera: Conjunctivae normal.  Cardiovascular:     Rate and Rhythm: Normal rate and regular rhythm.     Pulses: Normal pulses.  Pulmonary:     Effort: Pulmonary effort is normal. No respiratory distress.     Breath sounds: Normal breath sounds.  Abdominal:     Abdomen is soft. There is no abdominal tenderness. No rebound or guarding. No distention. Musculoskeletal:        General: No swelling. Normal range of motion.     Cervical back: Normal range of motion and neck supple.  Skin:    General: Skin is warm and dry.     Capillary Refill: Capillary refill takes less than 2 seconds.     Findings: No rash.  Neurological:     Mental Status: The patient is awake and alert.      ED Results / Procedures / Treatments   Labs (all labs ordered are listed, but only abnormal results are displayed) Labs Reviewed  COMPREHENSIVE METABOLIC PANEL - Abnormal;  Notable for the following components:      Result Value   Glucose, Bld 120 (*)    All other components within normal limits  URINALYSIS, ROUTINE W REFLEX MICROSCOPIC - Abnormal; Notable for the following components:   Color, Urine YELLOW (*)    APPearance CLEAR (*)    Hgb urine dipstick SMALL (*)    All other components within normal limits  CBC WITH DIFFERENTIAL/PLATELET  LIPASE, BLOOD     EKG     RADIOLOGY I independently reviewed and interpreted imaging and agree with radiologists findings.     PROCEDURES:  Critical Care performed:   Procedures   MEDICATIONS ORDERED IN ED: Medications  iohexol (OMNIPAQUE) 300 MG/ML solution 100 mL (100 mLs Intravenous Contrast Given 03/22/23 0816)     IMPRESSION / MDM / ASSESSMENT AND PLAN / ED COURSE  I reviewed the triage vital signs and the nursing notes.   Differential diagnosis includes, but is not limited to, diverticulitis, perforation, abscess, gastroenteritis, urinary tract infection.  Patient presents the emergency department awake and alert, nontoxic in appearance.  Her initial triage vital signs revealed a heart rate of 120, though upon my evaluation her heart rate had normalized and upon recheck was in the 80s.  She is normotensive.  She is afebrile.  Her abdomen is soft and nontender throughout.  I reviewed the patient's chart.  Patient saw her PCP 3 days ago on 03/19/2023.  She was started empirically on Augmentin for possible diverticulitis, though no imaging was obtained.  Labs obtained in triage are overall reassuring.  She has no leukocytosis, normal LFTs and lipase, normal electrolytes, normal BUN to creatinine ratio, not consistent with prerenal volume depletion.  She is able to eat and drink.  CT scan obtained given continuing symptoms is normal.  Upon reevaluation, patient reports that she feels better.  She was unable to provide a stool sample for the 3.5 hours that she was here.  Discussed need for stool  sample if her symptoms worsen or persist.  We discussed symptomatic management and return precautions.  Patient understands and agrees with plan.  She was discharged in the care of her daughter.  Patient's presentation is most consistent with acute complicated illness / injury requiring diagnostic workup.    FINAL CLINICAL IMPRESSION(S) / ED DIAGNOSES   Final diagnoses:  Diarrhea, unspecified type  Rx / DC Orders   ED Discharge Orders     None        Note:  This document was prepared using Dragon voice recognition software and may include unintentional dictation errors.   Keturah Shavers 03/22/23 1208    Concha Se, MD 03/22/23 [phone]

## 2023-03-22 NOTE — ED Triage Notes (Signed)
Pt to triage via w/c with no distress noted; st was dx with diverticulitis on Monday by PCP and rx augmentin; today symptoms reoccured (nausea, diarrhea and upper abd pain, cramping, gas and burping)

## 2023-03-22 NOTE — Discharge Instructions (Signed)
You were seen in the Emergency Department for your abdominal pain. Your vital signs and physical exam were normal, you were observed and there is no evidence of any serious medical problems. We checked lab work including your blood counts and general chemistries and these were all normal today without any remarkable abnormalities.  Your CT scan was normal.  It is most likely that your symptoms today are caused by a viral infection and should improve over the next few days.  It is important that you maintain a gentle diet with plenty of clear fluids.  You can use the medication we provided you for symptomatic relief of the nausea.  You should stay well hydrated. -- Rest, Drink lots of fluids. Advance diet as tolerated, if your have worsening abdominal pain, if you are unable to keep fluids down, should you develop fever greater than 100.4, return to the Emergency Department. If you have any concerns at all, please return to the Emergency Department. -- For the next 48 hours maintain hydration with at least 8 glasses of fluid per day of clear juices- apple or cranberry, broth, jell-O, popsicles, sherbert, tea, ginger ale and dry toast and crackers. -- After 48 hours Progress your diet to soft BRAT Diet: rice, bananas, applesauce and toast: then mashed potatoes and vegetables, -- Advance as tolerated, vegetables, eggs, chicken, fish, fruit -- Refrain from dairy products, fatty greasy, spicy food for one week If you have increasing abdominal pain, bright red blood from rectum, uncontrollable nausea and vomiting, fever > 100.6F, chills, inability to hold down liquids, decrease in urination, or any symptoms

## 2023-04-02 ENCOUNTER — Telehealth: Payer: Self-pay | Admitting: Family Medicine

## 2023-04-02 NOTE — Telephone Encounter (Signed)
[facility] called again requesting clinical notes for pt

## 2023-04-02 NOTE — Telephone Encounter (Signed)
error 

## 2023-04-03 NOTE — Telephone Encounter (Signed)
Noted. Patient is scheduled with me next week and we can discuss and document then.

## 2023-04-03 NOTE — Telephone Encounter (Signed)
[facility] "needs most recent treatment notes regarding the need for compression therapy." I've reviewed her last visit with you on 03/19/23 and there was no documentation of compression therapy. Order is for sleeve, glove gauntlet and accessory for gradient compression garment or wrap.

## 2023-04-03 NOTE — Telephone Encounter (Signed)
Called number provided and left voicemail for office to call back

## 2023-04-04 NOTE — Telephone Encounter (Signed)
noted 

## 2023-04-05 ENCOUNTER — Telehealth: Payer: Self-pay

## 2023-04-05 NOTE — Telephone Encounter (Signed)
This note was made in error.  After I started this note, Diannia Ruder told me that she had called previously.  See other telephone note for full message.

## 2023-04-05 NOTE — Telephone Encounter (Signed)
Diannia Ruder called from [employer] to follow-up on previous message.  I let her know that we do not have documentation yet, but can get it at patient's next visit.  Diannia Ruder states she will talk with patient to find out when she will see Dr. Birdie Sons next to determine timing.

## 2023-04-05 NOTE — Telephone Encounter (Signed)
Diannia Ruder called from [employer] to request a clinical note

## 2023-04-05 NOTE — Telephone Encounter (Signed)
Noted  

## 2023-04-08 ENCOUNTER — Other Ambulatory Visit: Payer: Self-pay | Admitting: Oncology

## 2023-04-09 ENCOUNTER — Ambulatory Visit: Payer: Medicare Other | Admitting: Family Medicine

## 2023-04-11 ENCOUNTER — Ambulatory Visit (INDEPENDENT_AMBULATORY_CARE_PROVIDER_SITE_OTHER): Payer: Medicare Other | Admitting: Family Medicine

## 2023-04-11 ENCOUNTER — Encounter: Payer: Self-pay | Admitting: Family Medicine

## 2023-04-11 VITALS — BP 126/82 | HR 88 | Temp 98.4°F | Ht 65.0 in | Wt 192.4 lb

## 2023-04-11 DIAGNOSIS — K5792 Diverticulitis of intestine, part unspecified, without perforation or abscess without bleeding: Secondary | ICD-10-CM | POA: Diagnosis not present

## 2023-04-11 DIAGNOSIS — I890 Lymphedema, not elsewhere classified: Secondary | ICD-10-CM | POA: Diagnosis not present

## 2023-04-11 NOTE — Telephone Encounter (Signed)
Please fax today's note to [facility] medical supply.  Thanks.

## 2023-04-11 NOTE — Assessment & Plan Note (Signed)
Symptoms have resolved at this point.  Patient will monitor for recurrence.

## 2023-04-11 NOTE — Assessment & Plan Note (Signed)
Chronic issue.  Related to prior breast cancer surgery.  She is benefiting from compression sleeves.  She will continue with the compression sleeve and occasional occupational therapy for this.  Will fax our note to [facility] medical supply.

## 2023-04-11 NOTE — Progress Notes (Signed)
Marikay Alar, MD Phone: [phone]  Theresa Reid is a 82 y.o. female who presents today for f/u.  Diarrhea: Patient notes resolution of diarrhea.  She was in the emergency department for this after she last saw me.  She had negative CT scan.  She notes no abdominal pain, nausea, vomiting, diarrhea, or blood in her stool.  She feels like she is back to normal.  Lymphedema: Patient has lymphedema in her right upper extremity related to breast cancer surgery in the past.  She has no pain associated with this.  She wears compression sleeves which are beneficial and help reduce her swelling.  She periodically sees occupational therapy for treatment of this.  Social History   Tobacco Use  Smoking Status Never   Passive exposure: Never  Smokeless Tobacco Never    Current Outpatient Medications on File Prior to Visit  Medication Sig Dispense Refill   acetaminophen (TYLENOL) 325 MG tablet Take 2 tablets (650 mg total) by mouth every 6 (six) hours as needed for mild pain (or Fever >/= 101).     amLODipine (NORVASC) 5 MG tablet TAKE 1 TABLET BY MOUTH DAILY 100 tablet 2   amoxicillin-clavulanate (AUGMENTIN) 875-125 MG tablet Take 1 tablet by mouth every 8 (eight) hours. 30 tablet 0   anastrozole (ARIMIDEX) 1 MG tablet TAKE 1 TABLET BY MOUTH DAILY 100 tablet 2   atorvastatin (LIPITOR) 80 MG tablet TAKE 1 TABLET BY MOUTH ONCE  DAILY 100 tablet 2   CALCIUM-VITAMIN D PO Take 1 tablet by mouth daily.      Multiple Vitamins tablet Take 1 tablet by mouth daily.      omeprazole (PRILOSEC) 20 MG capsule TAKE 1 CAPSULE BY MOUTH DAILY 100 capsule 2   oxybutynin (DITROPAN) 5 MG tablet TAKE 1 TABLET BY MOUTH TWICE  DAILY 200 tablet 2   predniSONE (DELTASONE) 5 MG tablet TAKE 1 TO 2 TABLETS BY MOUTH  DAILY AS NEEDED FOR ARTHRITIS  FLARE 90 tablet 0   [DISCONTINUED] prochlorperazine (COMPAZINE) 10 MG tablet Take 1 tablet (10 mg total) by mouth every 6 (six) hours as needed (Nausea or vomiting). 30 tablet  1   No current facility-administered medications on file prior to visit.     ROS see history of present illness  Objective  Physical Exam Vitals:   04/11/23 1351  BP: 126/82  Pulse: 88  Temp: 98.4 F (36.9 C)  SpO2: 97%    BP Readings from Last 3 Encounters:  04/11/23 126/82  03/22/23 (!) 153/69  03/19/23 126/82   Wt Readings from Last 3 Encounters:  04/11/23 192 lb 6.4 oz (87.3 kg)  03/22/23 194 lb 9.6 oz (88.3 kg)  03/19/23 196 lb (88.9 kg)    Physical Exam Abdominal:     General: Bowel sounds are normal. There is no distension.     Palpations: Abdomen is soft.     Tenderness: There is no abdominal tenderness.  Musculoskeletal:     Comments: Compression stocking in place on right upper extremity      Assessment/Plan: Please see individual problem list.  Diverticulitis Assessment & Plan: Symptoms have resolved at this point.  Patient will monitor for recurrence.   Lymphedema Assessment & Plan: Chronic issue.  Related to prior breast cancer surgery.  She is benefiting from compression sleeves.  She will continue with the compression sleeve and occasional occupational therapy for this.  Will fax our note to [facility] medical supply.     Return for As scheduled.   Minerva Areola  Birdie Sons, MD [facility]

## 2023-04-12 NOTE — Telephone Encounter (Signed)
Office notes efaxed to [facility]

## 2023-04-19 DIAGNOSIS — I890 Lymphedema, not elsewhere classified: Secondary | ICD-10-CM | POA: Diagnosis not present

## 2023-04-21 ENCOUNTER — Other Ambulatory Visit: Payer: Self-pay | Admitting: Family Medicine

## 2023-04-21 DIAGNOSIS — K219 Gastro-esophageal reflux disease without esophagitis: Secondary | ICD-10-CM

## 2023-05-04 ENCOUNTER — Ambulatory Visit: Payer: Medicare Other | Admitting: Family Medicine

## 2023-05-24 DIAGNOSIS — H43813 Vitreous degeneration, bilateral: Secondary | ICD-10-CM | POA: Diagnosis not present

## 2023-05-24 DIAGNOSIS — H04123 Dry eye syndrome of bilateral lacrimal glands: Secondary | ICD-10-CM | POA: Diagnosis not present

## 2023-05-24 DIAGNOSIS — H2513 Age-related nuclear cataract, bilateral: Secondary | ICD-10-CM | POA: Diagnosis not present

## 2023-05-24 DIAGNOSIS — H353132 Nonexudative age-related macular degeneration, bilateral, intermediate dry stage: Secondary | ICD-10-CM | POA: Diagnosis not present

## 2023-05-29 ENCOUNTER — Ambulatory Visit: Payer: Medicare Other

## 2023-06-04 ENCOUNTER — Encounter: Payer: Medicare Other | Admitting: Family Medicine

## 2023-06-05 ENCOUNTER — Telehealth: Payer: Self-pay | Admitting: Family Medicine

## 2023-06-05 NOTE — Telephone Encounter (Signed)
Patient is needing a sooner appt then what is available she needs to be seen this month her dr she was seeing has left her daughter would like a call back

## 2023-06-05 NOTE — Telephone Encounter (Signed)
Spoke to pt daughter and she just wanted an earlier [facility]  appt for Mom because she does not want to run out of her medications or need medications and not be able to get them. I passed the message along to [facility] to speak to Dr Clent Ridges and see what could possibly be done and informed Daughter that someone would reach back out to her to let them know.

## 2023-06-05 NOTE — Telephone Encounter (Signed)
I have spoke to pt daughter and she is ok with waiting until the spring to get mom seen. Will get her in with the next TOC available

## 2023-06-06 ENCOUNTER — Telehealth: Payer: Self-pay

## 2023-06-06 NOTE — Telephone Encounter (Signed)
Copied from CRM [id number]. Topic: Appointments - Transfer of Care >> Jun 06, 2023  1:39 PM [name] wrote: Pt is requesting to transfer FROM: Theresa Reid Pt is requesting to transfer TO: Theresa Reid Reason for requested transfer: Theresa Reid It is the responsibility of the team the patient would like to transfer to (Dr. Evelene Croon) to reach out to the patient if for any reason this transfer is not acceptable.

## 2023-06-07 NOTE — Telephone Encounter (Signed)
Sent Patient a mychart message to let her know that Theresa Reid will accept her as a TOC.

## 2023-06-07 NOTE — Telephone Encounter (Signed)
I am fine with TOC.

## 2023-06-08 ENCOUNTER — Other Ambulatory Visit: Payer: Self-pay | Admitting: Oncology

## 2023-06-08 ENCOUNTER — Ambulatory Visit (INDEPENDENT_AMBULATORY_CARE_PROVIDER_SITE_OTHER): Payer: Medicare Other | Admitting: Family Medicine

## 2023-06-08 ENCOUNTER — Encounter: Payer: Self-pay | Admitting: Family Medicine

## 2023-06-08 VITALS — BP 112/72 | HR 84 | Temp 98.9°F | Wt 194.4 lb

## 2023-06-08 DIAGNOSIS — N3941 Urge incontinence: Secondary | ICD-10-CM

## 2023-06-08 DIAGNOSIS — E785 Hyperlipidemia, unspecified: Secondary | ICD-10-CM | POA: Diagnosis not present

## 2023-06-08 DIAGNOSIS — K5792 Diverticulitis of intestine, part unspecified, without perforation or abscess without bleeding: Secondary | ICD-10-CM

## 2023-06-08 DIAGNOSIS — C50911 Malignant neoplasm of unspecified site of right female breast: Secondary | ICD-10-CM

## 2023-06-08 DIAGNOSIS — I10 Essential (primary) hypertension: Secondary | ICD-10-CM | POA: Diagnosis not present

## 2023-06-08 DIAGNOSIS — R42 Dizziness and giddiness: Secondary | ICD-10-CM

## 2023-06-08 NOTE — Assessment & Plan Note (Signed)
Chronic issue.  She will continue to see oncology.  Mammogram ordered.

## 2023-06-08 NOTE — Assessment & Plan Note (Signed)
Chronic issue.  Adequately controlled.  Patient will continue amlodipine 5 mg daily.

## 2023-06-08 NOTE — Assessment & Plan Note (Signed)
Chronic issue.  Adequately controlled on most recent labs.  Continue Lipitor 80 mg daily.  She will be due for labs over the summer.

## 2023-06-08 NOTE — Progress Notes (Signed)
Marikay Alar, MD Phone: [phone]  Theresa Reid is a 83 y.o. female who presents today for f/u.  History of breast cancer: Patient notes she is due for her mammogram.  She needs this ordered.  She notes no breast changes.  She continues to see oncology for breast exams.  Hypertension: Typically less than 140/90.  Taking amlodipine.  No chest pain, shortness of breath.  Hyperlipidemia: Taking Lipitor.  No right upper quadrant pain or myalgias.  Social History   Tobacco Use  Smoking Status Never   Passive exposure: Never  Smokeless Tobacco Never    Current Outpatient Medications on File Prior to Visit  Medication Sig Dispense Refill   acetaminophen (TYLENOL) 325 MG tablet Take 2 tablets (650 mg total) by mouth every 6 (six) hours as needed for mild pain (or Fever >/= 101).     amLODipine (NORVASC) 5 MG tablet TAKE 1 TABLET BY MOUTH DAILY 100 tablet 2   anastrozole (ARIMIDEX) 1 MG tablet TAKE 1 TABLET BY MOUTH DAILY 100 tablet 2   atorvastatin (LIPITOR) 80 MG tablet TAKE 1 TABLET BY MOUTH ONCE  DAILY 100 tablet 2   CALCIUM-VITAMIN D PO Take 1 tablet by mouth daily.      Multiple Vitamins tablet Take 1 tablet by mouth daily.      omeprazole (PRILOSEC) 20 MG capsule TAKE 1 CAPSULE BY MOUTH DAILY 100 capsule 2   oxybutynin (DITROPAN) 5 MG tablet TAKE 1 TABLET BY MOUTH TWICE  DAILY 200 tablet 2   [DISCONTINUED] prochlorperazine (COMPAZINE) 10 MG tablet Take 1 tablet (10 mg total) by mouth every 6 (six) hours as needed (Nausea or vomiting). 30 tablet 1   No current facility-administered medications on file prior to visit.     ROS see history of present illness  Objective  Physical Exam Vitals:   06/08/23 1301  BP: 112/72  Pulse: 84  Temp: 98.9 F (37.2 C)  SpO2: 94%    BP Readings from Last 3 Encounters:  06/08/23 112/72  04/11/23 126/82  03/22/23 (!) 153/69   Wt Readings from Last 3 Encounters:  06/08/23 194 lb 6.4 oz (88.2 kg)  04/11/23 192 lb 6.4 oz (87.3  kg)  03/22/23 194 lb 9.6 oz (88.3 kg)    Physical Exam Constitutional:      General: She is not in acute distress.    Appearance: She is not diaphoretic.  Cardiovascular:     Rate and Rhythm: Normal rate and regular rhythm.     Heart sounds: Normal heart sounds.  Pulmonary:     Effort: Pulmonary effort is normal.  Musculoskeletal:     Right lower leg: No edema.     Left lower leg: No edema.  Neurological:     Mental Status: She is alert.      Assessment/Plan: Please see individual problem list.  Primary hypertension Assessment & Plan: Chronic issue.  Adequately controlled.  Patient will continue amlodipine 5 mg daily.   Hyperlipidemia, unspecified hyperlipidemia type Assessment & Plan: Chronic issue.  Adequately controlled on most recent labs.  Continue Lipitor 80 mg daily.  She will be due for labs over the summer.   Malignant neoplasm of right female breast, unspecified estrogen receptor status, unspecified site of breast [facility]) Assessment & Plan: Chronic issue.  She will continue to see oncology.  Mammogram ordered.  Orders: -     3D Screening Mammogram, Left and Right; Future     Return for As scheduled.   Marikay Alar, MD [facility]

## 2023-06-11 ENCOUNTER — Ambulatory Visit (INDEPENDENT_AMBULATORY_CARE_PROVIDER_SITE_OTHER): Payer: Medicare Other | Admitting: *Deleted

## 2023-06-11 VITALS — Ht 63.0 in | Wt 194.0 lb

## 2023-06-11 DIAGNOSIS — Z0000 Encounter for general adult medical examination without abnormal findings: Secondary | ICD-10-CM

## 2023-06-11 NOTE — Patient Instructions (Signed)
Theresa Reid , Thank you for taking time to come for your Medicare Wellness Visit. I appreciate your ongoing commitment to your health goals. Please review the following plan we discussed and let me know if I can assist you in the future.   Referrals/Orders/Follow-Ups/Clinician Recommendations: None  This is a list of the screening recommended for you and due dates:  Health Maintenance  Topic Date Due   COVID-19 Vaccine (6 - 2024-25 season) 12/24/2022   Mammogram  06/26/2023   Medicare Annual Wellness Visit  06/10/2024   DTaP/Tdap/Td vaccine (2 - Td or Tdap) 01/23/2027   Pneumonia Vaccine  Completed   Flu Shot  Completed   DEXA scan (bone density measurement)  Completed   Zoster (Shingles) Vaccine  Completed   HPV Vaccine  Aged Out    Advanced directives: (In Chart) A copy of your advanced directives are scanned into your chart should your provider ever need it.  Next Medicare Annual Wellness Visit scheduled for next year: Yes 06/11/24 @ 3:00

## 2023-06-11 NOTE — Progress Notes (Signed)
Subjective:   Theresa Reid is a 83 y.o. female who presents for Medicare Annual (Subsequent) preventive examination.  Visit Complete: Virtual I connected with  [name] on 06/11/23 by a audio enabled telemedicine application and verified that I am speaking with the correct person using two identifiers. This patient declined Interactive audio and Acupuncturist. Therefore the visit was completed with audio only.   Patient Location: Home  Provider Location: Office/Clinic  I discussed the limitations of evaluation and management by telemedicine. The patient expressed understanding and agreed to proceed.  Vital Signs: Because this visit was a virtual/telehealth visit, some criteria may be missing or patient reported. Any vitals not documented were not able to be obtained and vitals that have been documented are patient reported.   Cardiac Risk Factors include: advanced age (>72men, >33 women);dyslipidemia;hypertension;obesity (BMI >30kg/m2)     Objective:    Today's Vitals   06/11/23 1012  Weight: 194 lb (88 kg)  Height: 5\' 3"  (1.6 m)  PainSc: 2    Body mass index is 34.37 kg/m.     06/11/2023   10:27 AM 03/22/2023    6:24 AM 01/17/2023    2:00 PM 12/26/2022    2:16 PM 12/26/2022    2:05 PM 05/22/2022    1:44 PM 12/20/2021    1:33 PM  Advanced Directives  Does Patient Have a Medical Advance Directive? Yes No No Yes Yes Yes Yes  Type of Estate agent of [place];Living will   Healthcare Power of [place];Living will Healthcare Power of [place];Living will Healthcare Power of [place];Living will Living will;Healthcare Power of Attorney  Does patient want to make changes to medical advance directive? No - Patient declined     No - Patient declined   Copy of Healthcare Power of Attorney in Chart? Yes - validated most recent copy scanned in chart (See row information)     Yes - validated most recent copy scanned in chart (See row information)    Would patient like information on creating a medical advance directive?  No - Patient declined         Current Medications (verified) Outpatient Encounter Medications as of 06/11/2023  Medication Sig   acetaminophen (TYLENOL) 325 MG tablet Take 2 tablets (650 mg total) by mouth every 6 (six) hours as needed for mild pain (or Fever >/= 101).   amLODipine (NORVASC) 5 MG tablet TAKE 1 TABLET BY MOUTH DAILY   anastrozole (ARIMIDEX) 1 MG tablet TAKE 1 TABLET BY MOUTH DAILY   atorvastatin (LIPITOR) 80 MG tablet TAKE 1 TABLET BY MOUTH ONCE  DAILY   CALCIUM-VITAMIN D PO Take 1 tablet by mouth daily.    Multiple Vitamins tablet Take 1 tablet by mouth daily.    omeprazole (PRILOSEC) 20 MG capsule TAKE 1 CAPSULE BY MOUTH DAILY   oxybutynin (DITROPAN) 5 MG tablet TAKE 1 TABLET BY MOUTH TWICE  DAILY   predniSONE (DELTASONE) 5 MG tablet Take 5 mg by mouth as needed.   [DISCONTINUED] prochlorperazine (COMPAZINE) 10 MG tablet Take 1 tablet (10 mg total) by mouth every 6 (six) hours as needed (Nausea or vomiting).   No facility-administered encounter medications on file as of 06/11/2023.    Allergies (verified) Patient has no known allergies.   History: Past Medical History:  Diagnosis Date   Arthritis    Breast cancer (HCC)    Diverticulitis    Family history of breast cancer    GERD (gastroesophageal reflux disease)    Hyperlipidemia  Hypertension    Lymphedema    right arm   Personal history of chemotherapy    Personal history of radiation therapy    Pre-diabetes    Past Surgical History:  Procedure Laterality Date   APPLICATION OF INTRAOPERATIVE CT SCAN N/A 11/16/2021   Procedure: APPLICATION OF INTRAOPERATIVE CT SCAN;  Surgeon: Venetia Night, MD;  Location: [facility];  Service: Neurosurgery;  Laterality: N/A;   BREAST BIOPSY Right 05/28/2018   [place] bx, INVASIVE MAMMARY CARCINOMA WITH LOBULAR FEATURES   BREAST LUMPECTOMY Right 06/12/2018   [facility], lobular features   BREAST  LUMPECTOMY WITH SENTINEL LYMPH NODE BIOPSY Right 06/12/2018   Procedure: RIGHT BREAST WIDE EXCISION WITH SENTINEL LYMPH NODE BX;  Surgeon: Earline Mayotte, MD;  Location: [facility];  Service: General;  Laterality: Right;   CHOLECYSTECTOMY     GALLBLADDER SURGERY  1997   INCISION AND DRAINAGE OF WOUND Right 10/30/2018   Procedure: IRRIGATION AND DEBRIDEMENT RIGHT CHEST WALL WOUND;  Surgeon: Earline Mayotte, MD;  Location: [facility];  Service: General;  Laterality: Right;   MASTECTOMY Right    MASTECTOMY WITH AXILLARY LYMPH NODE DISSECTION Right 09/20/2018   Procedure: MASTECTOMY WITH AXILLARY LYMPH NODE DISSECTION RIGHT;  Surgeon: Earline Mayotte, MD;  Location: [facility];  Service: General;  Laterality: Right;   ovaraian cyst removal Right    Cyst only (NOT OVARY)   PORTACATH PLACEMENT Left 09/20/2018   Procedure: INSERTION PORT-A-CATH, LEFT;  Surgeon: Earline Mayotte, MD;  Location: [facility];  Service: General;  Laterality: Left;   POSTERIOR CERVICAL FUSION/FORAMINOTOMY N/A 11/16/2021   Procedure: C1-2 POSTERIOR FUSION WITH ORIF (OPEN REDUCTION INTERNAL FIXATION) C2 FRACTURE (GLOBUS KORE FIBER);  Surgeon: Venetia Night, MD;  Location: [facility];  Service: Neurosurgery;  Laterality: N/A;   TOTAL KNEE ARTHROPLASTY Bilateral 05/05/2010   Family History  Problem Relation Age of Onset   Alcohol abuse Mother        deceased 34   Arthritis Mother    Diabetes Father    Breast cancer Maternal Aunt        dx 37s; deceased 63s   Breast cancer Maternal Aunt        dx 1s; deceased 48s   Arthritis Maternal Grandmother    Social History   Socioeconomic History   Marital status: Single    Spouse name: Not on file   Number of children: Not on file   Years of education: Not on file   Highest education level: Some college, no degree  Occupational History   Occupation: retired  Tobacco Use   Smoking status: Never    Passive exposure: Never   Smokeless tobacco: Never  Vaping Use    Vaping status: Never Used  Substance and Sexual Activity   Alcohol use: No    Alcohol/week: 0.0 standard drinks of alcohol   Drug use: No   Sexual activity: Never  Other Topics Concern   Not on file  Social History Narrative   Retired    Lives by herself    Pets: None   Caffeine- Coffee 2 cups daily, no tea/soda      Social Drivers of Corporate investment banker Strain: Low Risk  (06/11/2023)   Overall Financial Resource Strain (CARDIA)    Difficulty of Paying Living Expenses: Not hard at all  Food Insecurity: No Food Insecurity (06/11/2023)   Hunger Vital Sign    Worried About Running Out of Food in the Last Year: Never true    Ran  Out of Food in the Last Year: Never true  Transportation Needs: No Transportation Needs (06/11/2023)   PRAPARE - Administrator, Civil Service (Medical): No    Lack of Transportation (Non-Medical): No  Physical Activity: Inactive (06/11/2023)   Exercise Vital Sign    Days of Exercise per Week: 0 days    Minutes of Exercise per Session: 0 min  Stress: No Stress Concern Present (06/11/2023)   [employer] of Occupational Health - Occupational Stress Questionnaire    Feeling of Stress : Not at all  Social Connections: Socially Isolated (06/11/2023)   Social Connection and Isolation Panel [NHANES]    Frequency of Communication with Friends and Family: More than three times a week    Frequency of Social Gatherings with Friends and Family: More than three times a week    Attends Religious Services: Never    Database administrator or Organizations: No    Attends Engineer, structural: Never    Marital Status: Divorced    Tobacco Counseling Counseling given: Not Answered   Clinical Intake:  Pre-visit preparation completed: Yes  Pain : 0-10 Pain Score: 2  Pain Type: Chronic pain (arthritis pain) Pain Location: Shoulder Pain Descriptors / Indicators: Dull, Aching Pain Onset: More than a month ago Pain Frequency:  Intermittent     BMI - recorded: 34.37 Nutritional Status: BMI > 30  Obese Nutritional Risks: None Diabetes: No  How often do you need to have someone help you when you read instructions, pamphlets, or other written materials from your doctor or pharmacy?: 1 - Never  Interpreter Needed?: No  Information entered by :: R. Arasely Akkerman LPN   Activities of Daily Living    06/11/2023   10:15 AM  In your present state of health, do you have any difficulty performing the following activities:  Hearing? 0  Vision? 0  Difficulty concentrating or making decisions? 0  Walking or climbing stairs? 1  Dressing or bathing? 0  Doing errands, shopping? 1  Preparing Food and eating ? N  Using the Toilet? N  In the past six months, have you accidently leaked urine? Y  Do you have problems with loss of bowel control? N  Managing your Medications? N  Managing your Finances? N  Housekeeping or managing your Housekeeping? N    Patient Care Team: Glori Luis, MD as PCP - General (Family Medicine) Creig Hines, MD as Consulting Physician (Oncology) Jim Like, RN as Registered Nurse Lemar Livings, Merrily Pew, MD as Consulting Physician (General Surgery) Carmina Miller, MD as Referring Physician (Radiation Oncology)  Indicate any recent Medical Services you may have received from other than [facility] providers in the past year (date may be approximate).     Assessment:   This is a routine wellness examination for Syeda.  Hearing/Vision screen Hearing Screening - Comments:: No issues Vision Screening - Comments:: glasses   Goals Addressed             This Visit's Progress    Patient Stated       Wants to keep going and stay active       Depression Screen    06/11/2023   10:21 AM 04/11/2023    1:52 PM 03/19/2023    1:04 PM 12/15/2022    2:45 PM 11/01/2022    1:58 PM 05/22/2022    1:30 PM 05/03/2022    1:43 PM  PHQ 2/9 Scores  PHQ - 2 Score 0 0 0 0 0  0 0  PHQ- 9 Score 1 0 1 1  0      Fall Risk    06/11/2023   10:17 AM 04/11/2023    1:52 PM 03/19/2023    1:04 PM 12/15/2022    2:45 PM 11/01/2022    1:58 PM  Fall Risk   Falls in the past year? 0 0 0 0 0  Number falls in past yr: 0 0 0 0 0  Injury with Fall? 0 0 0 0 0  Risk for fall due to : No Fall Risks No Fall Risks No Fall Risks No Fall Risks No Fall Risks  Follow up Falls prevention discussed;Falls evaluation completed Falls evaluation completed Falls evaluation completed Falls evaluation completed Falls evaluation completed    MEDICARE RISK AT HOME: Medicare Risk at Home Any stairs in or around the home?: Yes If so, are there any without handrails?: No Home free of loose throw rugs in walkways, pet beds, electrical cords, etc?: Yes Adequate lighting in your home to reduce risk of falls?: Yes Life alert?: Yes Use of a cane, walker or w/c?: Yes Grab bars in the bathroom?: Yes Shower chair or bench in shower?: Yes Elevated toilet seat or a handicapped toilet?: Yes   Cognitive Function:    05/02/2018   12:14 PM 04/25/2017    2:56 PM 02/11/2016    3:02 PM  MMSE - Mini Mental State Exam  Orientation to time 5 5 5   Orientation to Place 5 5 5   Registration 3 3 3   Attention/ Calculation 5 5 5   Recall 3 3 3   Language- name 2 objects 2 2 2   Language- repeat 1 1 1   Language- follow 3 step command 3 3 3   Language- read & follow direction 1 1 1   Write a sentence 1 1 1   Copy design 1 1 1   Total score 30 30 30         06/11/2023   10:27 AM 05/22/2022    1:47 PM 05/07/2019   10:53 AM  6CIT Screen  What Year?  0 points 0 points  What month? 0 points 0 points 0 points  What time? 0 points 0 points 0 points  Count back from 20 0 points 0 points 0 points  Months in reverse 0 points 0 points 0 points  Repeat phrase 2 points 0 points 0 points  Total Score  0 points 0 points    Immunizations Immunization History  Administered Date(s) Administered   Fluad Quad(high Dose 65+) 01/06/2019, 01/04/2022    Influenza Split 12/28/2010   Influenza, High Dose Seasonal PF 01/22/2018   Influenza, Seasonal, Injecte, Preservative Fre 01/23/2012   Influenza,inj,Quad PF,6+ Mos 02/06/2013, 02/03/2014   Influenza,inj,quad, With Preservative 01/22/2017   Influenza-Unspecified 12/15/2014, 01/12/2016, 12/30/2016, 02/19/2020, 02/07/2021   PFIZER(Purple Top)SARS-COV-2 Vaccination 05/23/2019, 06/13/2019, 04/13/2020   Pfizer Covid-19 Vaccine Bivalent Booster 73yrs & up 02/18/2021, 02/18/2022   Pneumococcal Conjugate-13 02/11/2016, 03/01/2017   Pneumococcal Polysaccharide-23 01/04/2010, 11/08/2012   Pneumococcal-Unspecified 01/22/2017   Tdap 01/22/2017   Zoster Recombinant(Shingrix) 03/01/2017, 02/18/2022   Zoster, Live 09/22/2008    TDAP status: Up to date  Flu Vaccine status: Up to date  Pneumococcal vaccine status: Up to date  Covid-19 vaccine status: Completed vaccines  Qualifies for Shingles Vaccine? Yes   Zostavax completed Yes   Shingrix Completed?: Yes  Screening Tests Health Maintenance  Topic Date Due   COVID-19 Vaccine (6 - 2024-25 season) 12/24/2022   Medicare Annual Wellness (AWV)  05/23/2023   INFLUENZA VACCINE  07/23/2023 (Originally 11/23/2022)   DTaP/Tdap/Td (2 - Td or Tdap) 01/23/2027   Pneumonia Vaccine 64+ Years old  Completed   DEXA SCAN  Completed   Zoster Vaccines- Shingrix  Completed   HPV VACCINES  Aged Out    Health Maintenance  Health Maintenance Due  Topic Date Due   COVID-19 Vaccine (6 - 2024-25 season) 12/24/2022   Medicare Annual Wellness (AWV)  05/23/2023    Colorectal cancer screening: No longer required.   Mammogram status: Completed 06/2022. Repeat every year Scheduled 06/27/23  Bone Density status: Completed 05/2022. Results reflect: Bone density results: NORMAL. Repeat every 2 years.  Lung Cancer Screening: (Low Dose CT Chest recommended if Age 80-80 years, 20 pack-year currently smoking OR have quit w/in 15years.) does not qualify.     Additional  Screening:  Hepatitis C Screening: does not qualify; Completed NA age  Vision Screening: Recommended annual ophthalmology exams for early detection of glaucoma and other disorders of the eye. Is the patient up to date with their annual eye exam?  Yes  Who is the provider or what is the name of the office in which the patient attends annual eye exams? [facility] If pt is not established with a provider, would they like to be referred to a provider to establish care? No .   Dental Screening: Recommended annual dental exams for proper oral hygiene    Community Resource Referral / Chronic Care Management: CRR required this visit?  No   CCM required this visit?  No     Plan:     I have personally reviewed and noted the following in the patient's chart:   Medical and social history Use of alcohol, tobacco or illicit drugs  Current medications and supplements including opioid prescriptions. Patient is not currently taking opioid prescriptions. Functional ability and status Nutritional status Physical activity Advanced directives List of other physicians Hospitalizations, surgeries, and ER visits in previous 12 months Vitals Screenings to include cognitive, depression, and falls Referrals and appointments  In addition, I have reviewed and discussed with patient certain preventive protocols, quality metrics, and best practice recommendations. A written personalized care plan for preventive services as well as general preventive health recommendations were provided to patient.     Sydell Axon, LPN   [id number]   After Visit Summary: (MyChart) Due to this being a telephonic visit, the after visit summary with patients personalized plan was offered to patient via MyChart   Nurse Notes: None

## 2023-06-18 ENCOUNTER — Ambulatory Visit: Payer: Medicare Other | Admitting: Family Medicine

## 2023-06-18 ENCOUNTER — Encounter: Payer: Medicare Other | Admitting: Family Medicine

## 2023-06-25 ENCOUNTER — Encounter: Payer: Self-pay | Admitting: Oncology

## 2023-06-25 ENCOUNTER — Inpatient Hospital Stay: Payer: Medicare Other | Attending: Oncology | Admitting: Oncology

## 2023-06-25 VITALS — BP 143/70 | HR 87 | Temp 97.3°F | Resp 19 | Wt 196.7 lb

## 2023-06-25 DIAGNOSIS — C50911 Malignant neoplasm of unspecified site of right female breast: Secondary | ICD-10-CM | POA: Diagnosis not present

## 2023-06-25 DIAGNOSIS — Z79899 Other long term (current) drug therapy: Secondary | ICD-10-CM

## 2023-06-25 DIAGNOSIS — Z170 Estrogen receptor positive status [ER+]: Secondary | ICD-10-CM | POA: Diagnosis not present

## 2023-06-25 DIAGNOSIS — Z5181 Encounter for therapeutic drug level monitoring: Secondary | ICD-10-CM

## 2023-06-25 DIAGNOSIS — Z9221 Personal history of antineoplastic chemotherapy: Secondary | ICD-10-CM | POA: Insufficient documentation

## 2023-06-25 DIAGNOSIS — Z923 Personal history of irradiation: Secondary | ICD-10-CM | POA: Insufficient documentation

## 2023-06-25 DIAGNOSIS — Z79811 Long term (current) use of aromatase inhibitors: Secondary | ICD-10-CM | POA: Insufficient documentation

## 2023-06-25 DIAGNOSIS — Z08 Encounter for follow-up examination after completed treatment for malignant neoplasm: Secondary | ICD-10-CM

## 2023-06-25 NOTE — Progress Notes (Signed)
 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  Patient underwent lumpectomy and sentinel lymph node biopsy in February 2020.  Pathology showed invasive lobular carcinoma with positive medial margin which was reexcised with still positive.  Tumor was 24 mm, grade 2 ER PR positive and HER-2/neu negative.  One sentinel lymph node that was excised was positive for macro met of 8 mm.  No extranodal extension was present.  MammaPrint came back as high risk with an average 10-year risk of untreated disease is 29%.  Patient then underwent a bilateral MRI which showed additional suspicious non-mass enhancement along the inferior margin of the biopsy cavity extending 2 cm highly suspicious for continuous disease and another highly suspicious linear non-mass enhancement at the base of the nipple concerning for multifocal multicentric disease.  Patient underwent right  mastectomy and axillary lymph node dissection on 09/20/2018.  There was no residual invasive mammary carcinoma within the breast.  However 15 out of 20 lymph nodes were involved with metastatic carcinoma measuring up to 6 mm and remaining 5 lymph nodes with isolated tumor cells.  Pathologic stage was [id number]   Despite having surgery in May 2020 which was a second surgery-patient has not been able to start adjuvant chemotherapy.  Her mastectomy was complicated by flap necrosis requiring debridement antibiotics and she had to be taken back to the OR for the same. Given her age and postmastectomy complications and delayed wound healing-weekly Taxol x12 cycles was chosen as the regimen for her adjuvant chemotherapy which she completed on 02/25/2019.   Patient started Arimidex in December 2020 and also completed adjuvant Zometa.  She completed adjuvant radiation treatment  Interval history-she is doing well overall and denies any breast concerns at this time.  She has compliant with lymphedema sleeve placement as well.  ECOG PS- 1 Pain scale- 0   Review of systems- Review of Systems  Constitutional:  Negative for chills, fever, malaise/fatigue and weight loss.  HENT:  Negative for congestion, ear discharge and nosebleeds.   Eyes:  Negative for blurred vision.  Respiratory:  Negative for cough, hemoptysis, sputum production, shortness of breath and wheezing.   Cardiovascular:  Negative for chest pain, palpitations, orthopnea and claudication.  Gastrointestinal:  Negative for abdominal pain, blood in stool, constipation, diarrhea, heartburn, melena, nausea and vomiting.  Genitourinary:  Negative for dysuria, flank pain, frequency, hematuria and urgency.  Musculoskeletal:  Negative for back pain, joint pain and myalgias.  Skin:  Negative for rash.  Neurological:  Negative for dizziness, tingling, focal weakness, seizures, weakness and headaches.  Endo/Heme/Allergies:  Does not bruise/bleed easily.   Psychiatric/Behavioral:  Negative for depression and suicidal ideas. The patient does not have insomnia.       No Known Allergies   Past Medical History:  Diagnosis Date   Arthritis    Breast cancer (HCC)    Diverticulitis    Family history of breast cancer    GERD (gastroesophageal reflux disease)    Hyperlipidemia    Hypertension    Lymphedema    right arm   Personal history of chemotherapy    Personal history of radiation therapy    Pre-diabetes      Past Surgical History:  Procedure Laterality Date   APPLICATION OF INTRAOPERATIVE CT SCAN N/A 11/16/2021   Procedure: APPLICATION OF INTRAOPERATIVE CT SCAN;  Surgeon: Venetia Night, MD;  Location: [facility];  Service: Neurosurgery;  Laterality: N/A;   BREAST BIOPSY Right 05/28/2018   [place] bx, INVASIVE MAMMARY CARCINOMA WITH LOBULAR FEATURES   BREAST LUMPECTOMY Right 06/12/2018   [facility], lobular features   BREAST LUMPECTOMY WITH SENTINEL LYMPH NODE BIOPSY Right 06/12/2018   Procedure: RIGHT BREAST WIDE EXCISION WITH SENTINEL LYMPH NODE BX;  Surgeon: Earline Mayotte, MD;  Location: [facility];  Service: General;  Laterality: Right;   CHOLECYSTECTOMY     GALLBLADDER SURGERY  1997   INCISION AND DRAINAGE OF WOUND Right 10/30/2018   Procedure: IRRIGATION AND DEBRIDEMENT RIGHT CHEST WALL WOUND;  Surgeon: Earline Mayotte, MD;  Location: [facility];  Service: General;  Laterality: Right;   MASTECTOMY Right    MASTECTOMY WITH AXILLARY LYMPH NODE DISSECTION Right 09/20/2018   Procedure: MASTECTOMY WITH AXILLARY LYMPH NODE DISSECTION RIGHT;  Surgeon: Earline Mayotte, MD;  Location: [facility];  Service: General;  Laterality: Right;   ovaraian cyst removal Right    Cyst only (NOT OVARY)   PORTACATH PLACEMENT Left 09/20/2018   Procedure: INSERTION PORT-A-CATH, LEFT;  Surgeon: Earline Mayotte, MD;  Location: [facility];  Service: General;  Laterality: Left;   POSTERIOR CERVICAL FUSION/FORAMINOTOMY N/A 11/16/2021   Procedure: C1-2  POSTERIOR FUSION WITH ORIF (OPEN REDUCTION INTERNAL FIXATION) C2 FRACTURE (GLOBUS KORE FIBER);  Surgeon: Venetia Night, MD;  Location: [facility];  Service: Neurosurgery;  Laterality: N/A;   TOTAL KNEE ARTHROPLASTY Bilateral 05/05/2010    Social History   Socioeconomic History   Marital status: Single    Spouse name: Not on file   Number of children: Not on file   Years of education: Not on file   Highest education level: Some college, no degree  Occupational History   Occupation: retired  Tobacco Use   Smoking status: Never    Passive exposure: Never   Smokeless tobacco: Never  Vaping Use   Vaping status: Never Used  Substance and Sexual Activity   Alcohol use: No    Alcohol/week: 0.0 standard drinks of alcohol   Drug use: No   Sexual activity: Never  Other Topics Concern   Not on file  Social History Narrative   Retired    Lives by herself    Pets: None   Caffeine- Coffee 2 cups daily, no tea/soda      Social Drivers of Corporate investment banker Strain: Low Risk  (06/11/2023)   Overall Financial Resource Strain (CARDIA)    Difficulty of Paying Living Expenses: Not hard at all  Food Insecurity: No Food Insecurity (06/11/2023)   Hunger Vital Sign    Worried About Running Out of Food in the Last Year: Never true  Ran Out of Food in the Last Year: Never true  Transportation Needs: No Transportation Needs (06/11/2023)   PRAPARE - Administrator, Civil Service (Medical): No    Lack of Transportation (Non-Medical): No  Physical Activity: Inactive (06/11/2023)   Exercise Vital Sign    Days of Exercise per Week: 0 days    Minutes of Exercise per Session: 0 min  Stress: No Stress Concern Present (06/11/2023)   [employer] of Occupational Health - Occupational Stress Questionnaire    Feeling of Stress : Not at all  Social Connections: Socially Isolated (06/11/2023)   Social Connection and Isolation Panel [NHANES]    Frequency of Communication with  Friends and Family: More than three times a week    Frequency of Social Gatherings with Friends and Family: More than three times a week    Attends Religious Services: Never    Database administrator or Organizations: No    Attends Banker Meetings: Never    Marital Status: Divorced  Catering manager Violence: Not At Risk (06/11/2023)   Humiliation, Afraid, Rape, and Kick questionnaire    Fear of Current or Ex-Partner: No    Emotionally Abused: No    Physically Abused: No    Sexually Abused: No    Family History  Problem Relation Age of Onset   Alcohol abuse Mother        deceased 81   Arthritis Mother    Diabetes Father    Breast cancer Maternal Aunt        dx 17s; deceased 77s   Breast cancer Maternal Aunt        dx 60s; deceased 76s   Arthritis Maternal Grandmother      Current Outpatient Medications:    acetaminophen (TYLENOL) 325 MG tablet, Take 2 tablets (650 mg total) by mouth every 6 (six) hours as needed for mild pain (or Fever >/= 101)., Disp: , Rfl:    amLODipine (NORVASC) 5 MG tablet, TAKE 1 TABLET BY MOUTH DAILY, Disp: 100 tablet, Rfl: 2   anastrozole (ARIMIDEX) 1 MG tablet, TAKE 1 TABLET BY MOUTH DAILY, Disp: 100 tablet, Rfl: 2   atorvastatin (LIPITOR) 80 MG tablet, TAKE 1 TABLET BY MOUTH ONCE  DAILY, Disp: 100 tablet, Rfl: 2   CALCIUM-VITAMIN D PO, Take 1 tablet by mouth daily. , Disp: , Rfl:    Multiple Vitamins tablet, Take 1 tablet by mouth daily. , Disp: , Rfl:    omeprazole (PRILOSEC) 20 MG capsule, TAKE 1 CAPSULE BY MOUTH DAILY, Disp: 100 capsule, Rfl: 2   oxybutynin (DITROPAN) 5 MG tablet, TAKE 1 TABLET BY MOUTH TWICE  DAILY, Disp: 200 tablet, Rfl: 2   predniSONE (DELTASONE) 5 MG tablet, Take 5 mg by mouth as needed., Disp: , Rfl:   Physical exam:  Vitals:   06/25/23 1417  BP: (!) 143/70  Pulse: 87  Resp: 19  Temp: (!) 97.3 F (36.3 C)  TempSrc: Tympanic  SpO2: 99%  Weight: 196 lb 11.2 oz (89.2 kg)   Physical Exam Constitutional:       Comments: Ambulates with a cane appears in no acute distress  Cardiovascular:     Rate and Rhythm: Normal rate and regular rhythm.     Heart sounds: Normal heart sounds.  Pulmonary:     Effort: Pulmonary effort is normal.     Breath sounds: Normal breath sounds.  Skin:    General: Skin is warm and dry.  Neurological:     Mental  Status: She is alert and oriented to person, place, and time.   Chest wall exam: Patient is s/p right mastectomy without reconstruction.  No evidence of chest wall recurrence.  No palpable bilateral axillary adenopathy.  No palpable masses in the left breast.     Latest Ref Rng & Units 03/22/2023    6:30 AM  CMP  Glucose 70 - 99 mg/dL 161   BUN 8 - 23 mg/dL 11   Creatinine 0.96 - 1.00 mg/dL 0.45   Sodium 409 - 811 mmol/L 140   Potassium 3.5 - 5.1 mmol/L 3.6   Chloride 98 - 111 mmol/L 105   CO2 22 - 32 mmol/L 24   Calcium 8.9 - 10.3 mg/dL 9.4   Total Protein 6.5 - 8.1 g/dL 7.2   Total Bilirubin <9.1 mg/dL 0.7   Alkaline Phos 38 - 126 U/L 107   AST 15 - 41 U/L 22   ALT 0 - 44 U/L 17       Latest Ref Rng & Units 03/22/2023    6:30 AM  CBC  WBC 4.0 - 10.5 K/uL 7.2   Hemoglobin 12.0 - 15.0 g/dL 47.8   Hematocrit 29.5 - 46.0 % 42.4   Platelets 150 - 400 K/uL 236       Assessment and plan- Patient is a 83 y.o. female with history of stage III ER/PR positive HER2 negative right breast cancer s/p mastectomy adjuvant chemotherapy and radiation currently on Arimidex.  This is a routine follow-up visit for breast cancer  Patient is tolerating Arimidex well along with calcium and vitamin D.  She will be completing 5 years in December 2025 but given that she had lymph node positive disease I think it would be worthwhile if she could take it for 5 more years based on her overall quality of life and life expectancy.  Her baseline bone density scan is normal as well.  Clinically she is doing well with no concerning signs and symptoms of recurrence based on  today's exam.  I will see her back in 6 months no labs   Visit Diagnosis 1. Encounter for follow-up surveillance of breast cancer   2. Visit for monitoring Arimidex therapy   3. High risk medication use      Dr. Owens Shark, MD, MPH [facility] [phone] 06/25/2023 2:18 PM

## 2023-07-02 DIAGNOSIS — M542 Cervicalgia: Secondary | ICD-10-CM | POA: Diagnosis not present

## 2023-07-02 DIAGNOSIS — M19011 Primary osteoarthritis, right shoulder: Secondary | ICD-10-CM | POA: Diagnosis not present

## 2023-07-10 ENCOUNTER — Encounter

## 2023-07-16 ENCOUNTER — Telehealth: Payer: Self-pay | Admitting: Family Medicine

## 2023-07-16 ENCOUNTER — Ambulatory Visit: Attending: Oncology | Admitting: Occupational Therapy

## 2023-07-16 DIAGNOSIS — I972 Postmastectomy lymphedema syndrome: Secondary | ICD-10-CM

## 2023-07-16 NOTE — Telephone Encounter (Signed)
 Dr Clent Ridges is leaving the practice and your New Patient or Transfer of Care appointment needs to be rescheduled with another provider. Please call the office to schedule a Transfer of Care to either Dr Charlann Lange, Darleen Crocker or Kara Dies, NP.  Your appointment on June 2nd will be a follow up with Dr Clent Ridges.  E2C2 please schedule a TOC

## 2023-07-16 NOTE — Therapy (Signed)
 [facility] [address] [place], [address] Phone: [phone]   Fax:  [phone]  Occupational Therapy Treatment/Recert  Patient Details  Name: Theresa Reid MRN: [id number] Date of Birth: July 27, 1940 No data recorded  Encounter Date: 07/16/2023   OT End of Session - 07/16/23 1412     Visit Number 21    Number of Visits 22    Date for OT Re-Evaluation 09/10/23    OT Start Time 1345    OT Stop Time 1403    OT Time Calculation (min) 18 min    Activity Tolerance Patient tolerated treatment well    Behavior During Therapy [facility] for tasks assessed/performed             Past Medical History:  Diagnosis Date   Arthritis    Breast cancer (HCC)    Diverticulitis    Family history of breast cancer    GERD (gastroesophageal reflux disease)    Hyperlipidemia    Hypertension    Lymphedema    right arm   Personal history of chemotherapy    Personal history of radiation therapy    Pre-diabetes     Past Surgical History:  Procedure Laterality Date   APPLICATION OF INTRAOPERATIVE CT SCAN N/A 11/16/2021   Procedure: APPLICATION OF INTRAOPERATIVE CT SCAN;  Surgeon: Venetia Night, MD;  Location: [facility];  Service: Neurosurgery;  Laterality: N/A;   BREAST BIOPSY Right 05/28/2018   [place] bx, INVASIVE MAMMARY CARCINOMA WITH LOBULAR FEATURES   BREAST LUMPECTOMY Right 06/12/2018   [facility], lobular features   BREAST LUMPECTOMY WITH SENTINEL LYMPH NODE BIOPSY Right 06/12/2018   Procedure: RIGHT BREAST WIDE EXCISION WITH SENTINEL LYMPH NODE BX;  Surgeon: Earline Mayotte, MD;  Location: [facility];  Service: General;  Laterality: Right;   CHOLECYSTECTOMY     GALLBLADDER SURGERY  1997   INCISION AND DRAINAGE OF WOUND Right 10/30/2018   Procedure: IRRIGATION AND DEBRIDEMENT RIGHT CHEST WALL WOUND;  Surgeon: Earline Mayotte, MD;  Location: [facility];  Service: General;  Laterality: Right;   MASTECTOMY Right    MASTECTOMY  WITH AXILLARY LYMPH NODE DISSECTION Right 09/20/2018   Procedure: MASTECTOMY WITH AXILLARY LYMPH NODE DISSECTION RIGHT;  Surgeon: Earline Mayotte, MD;  Location: [facility];  Service: General;  Laterality: Right;   ovaraian cyst removal Right    Cyst only (NOT OVARY)   PORTACATH PLACEMENT Left 09/20/2018   Procedure: INSERTION PORT-A-CATH, LEFT;  Surgeon: Earline Mayotte, MD;  Location: [facility];  Service: General;  Laterality: Left;   POSTERIOR CERVICAL FUSION/FORAMINOTOMY N/A 11/16/2021   Procedure: C1-2 POSTERIOR FUSION WITH ORIF (OPEN REDUCTION INTERNAL FIXATION) C2 FRACTURE (GLOBUS KORE FIBER);  Surgeon: Venetia Night, MD;  Location: [facility];  Service: Neurosurgery;  Laterality: N/A;   TOTAL KNEE ARTHROPLASTY Bilateral 05/05/2010    There were no vitals filed for this visit.   Subjective Assessment - 07/16/23 1411     Subjective  It took months the get my sleeve- first the order - then they said package was at [facility] -and then they reordered my sleeve    Pertinent History Patient had her original lumpectomy and sentinel lymph node biopsy back in February 2020 R breast   She then had an MRI which showed more areas of enhancement and was taken back to the OR for mastectomy and axillary lymph node dissection on 09/20/2018 which showed 15 of the lymph nodes were also positive for malignancy and this upstaged her  to stage IIIa disease.  Plan was to offer her adjuvant chemotherapy following this.  However her mastectomy course was complicated by flap necrosis requiring debridement antibiotics and she had to be also taken to the OR on 10/30/2018.  Her wound did not completely healed  and she still has a drain in place.  She seen  Dr. Everlene Farrier on 11/18/2018.  There is concern for early cellulitis of her chest wall.  Dr. Everlene Farrier after his visit started  patient on Augmentin for 2 more weeks which further delays her chemotherapy. Lymphedema in R UE per pt developed after surgery on 10/30/2018 - stayed the  same with compression on R UE and use of jovipak breast pad  - denies pain - pt comes every about 5-7 months for re assessent and getting replacement for her daytime compression sleeve- was seen March 23 and did not get replacement - pt fell about 2 months ago and ended up with neck fx and had cervcal fusion of C1 and C2 about 6 wks ago    Patient Stated Goals I want to see if need new compression sleeve to prevent lymphedema to increase and infection    Currently in Pain? No/denies                 LYMPHEDEMA/ONCOLOGY QUESTIONNAIRE - 07/16/23 0001       Right Upper Extremity Lymphedema   15 cm Proximal to Olecranon Process 38 cm    10 cm Proximal to Olecranon Process 38.5 cm    Olecranon Process 25.5 cm    15 cm Proximal to Ulnar Styloid Process 22 cm    10 cm Proximal to Ulnar Styloid Process 19 cm    Just Proximal to Ulnar Styloid Process 14.7 cm    Across Hand at [facility] 18 cm              Patient arrived with new Jobst Elvarex soft sleeve.  Took a while to get because of prescription issues and then [employer] did not deliver.  And had to reorder. Measurements taken. Able to contain with her old sleeve compared to last time. Fitting patient with new compression sleeve.  Upon fitting appear forearm of sleeve too short.  As well as in total length. Patient will stop by [facility] and show them.  Asked to get remeasured and reorder sleeve. Patient to contact me when new sleeve comes in to reassess for it.                 OT Short Term Goals - 07/11/21 1502       OT SHORT TERM GOAL #1   Title Pt's R UE circumference decrease by at least 1cm -2 cm from hand to upper arm - to be fitted for appropriate compression garments    Status Achieved               OT Long Term Goals - 07/16/23 1417       OT LONG TERM GOAL #2   Title Pt maintain her R UE and thoracic lymphedema  circumference with correct compression garments and HEP    Baseline Pt  cont to wear  Jobst  Elvarex soft compression sleeve with glove -as well as jovipak breast pad- measurements WNL compare to L and in past- pt in need for new compression sleeve    Time 8    Period Weeks    Status On-going    Target Date 09/10/23  Plan - 07/16/23 1414     Clinical Impression Statement Pt with diagnosis of R UE lymphedema - she done great thru the years to maintain her R UE circumference with wearing daily Jobst Elvarex sleeve and glove and  her Unilateral post mastectomy jovipak breast pad as needed- was fitted in Nov 23 last time. . Circumference in R UE compare to the L  and in the past  WNL using Jobst Elvarex soft sleeve and glove. Pt in needed new sleeve and glovel. Recommend for her to use her Jovipak breastpad  as needed night time or day time to  clear thoracic lymphedema. Pt arrive with new sleeve but upon fitting - to short at the forearm and in length - pt to go by [facility] and show them - get remeasure and for them to order sleeve - contact me when she gets sleeve to assess fit and review homeprogram. Pt can benefit from skilled OT services to assess fit of new compression and review homeprogram including donning to decrease strain on cervical and shoulder.    OT Occupational Profile and History Problem Focused Assessment - Including review of records relating to presenting problem    Occupational performance deficits (Please refer to evaluation for details): ADL's;Play;Leisure;IADL's    Body Structure / Function / Physical Skills ADL;UE functional use;Scar mobility;Skin integrity;Edema    Rehab Potential Good    Clinical Decision Making Several treatment options, min-mod task modification necessary    Comorbidities Affecting Occupational Performance: May have comorbidities impacting occupational performance    Modification or Assistance to Complete Evaluation  No modification of tasks or assist necessary to complete eval    OT Frequency  --   2 visits   OT Duration 8 weeks    OT Treatment/Interventions Self-care/ADL training;Manual lymph drainage;Therapeutic exercise;Patient/family education;Manual Therapy;Compression bandaging    Consulted and Agree with Plan of Care Patient             Patient will benefit from skilled therapeutic intervention in order to improve the following deficits and impairments:   Body Structure / Function / Physical Skills: ADL, UE functional use, Scar mobility, Skin integrity, Edema       Visit Diagnosis: Postmastectomy lymphedema syndrome    Problem List Patient Active Problem List   Diagnosis Date Noted   Lymphedema 04/11/2023   Diverticulitis 03/19/2023   Disequilibrium 12/15/2022   Chronic cough 12/15/2022   Fingernail abnormalities 11/01/2022   Neuropathy 11/01/2022   Palpitations 07/18/2022   Trapezius strain 05/03/2022   History of diverticulitis 05/03/2022   Fatigue 01/04/2022   S/P cervical spinal fusion 11/16/2021   Dens fracture (HCC) 11/04/2021   Pain in right shoulder 09/27/2021   Prediabetes 12/16/2020   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   Cataract 09/24/2018   Urge incontinence of urine 09/24/2018   Malignant neoplasm of right female breast (HCC) 06/07/2018   Family history of breast cancer    Dairy product intolerance 01/14/2018   Seborrheic keratosis 04/10/2017   Dysuria 02/19/2017   Visual disturbance 10/03/2016   GERD (gastroesophageal reflux disease) 09/14/2016   HLD (hyperlipidemia) 08/13/2015   Seborrheic dermatitis 07/01/2015   Hypertension 11/13/2014   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    Oletta Cohn, OTR/L,CLT 07/16/2023, 2:17 PM  [facility] [address] [place], [address] Phone: [phone]   Fax:  [phone]  Name: Theresa Reid MRN: [id number] Date of Birth: 1940-06-28

## 2023-07-23 ENCOUNTER — Ambulatory Visit (INDEPENDENT_AMBULATORY_CARE_PROVIDER_SITE_OTHER)

## 2023-07-23 ENCOUNTER — Telehealth: Payer: Self-pay

## 2023-07-23 VITALS — BP 128/74 | HR 96 | Temp 98.1°F | Ht 63.0 in | Wt 194.0 lb

## 2023-07-23 DIAGNOSIS — R5382 Chronic fatigue, unspecified: Secondary | ICD-10-CM

## 2023-07-23 DIAGNOSIS — K219 Gastro-esophageal reflux disease without esophagitis: Secondary | ICD-10-CM

## 2023-07-23 DIAGNOSIS — N3941 Urge incontinence: Secondary | ICD-10-CM | POA: Diagnosis not present

## 2023-07-23 DIAGNOSIS — E785 Hyperlipidemia, unspecified: Secondary | ICD-10-CM

## 2023-07-23 DIAGNOSIS — M316 Other giant cell arteritis: Secondary | ICD-10-CM

## 2023-07-23 DIAGNOSIS — R7303 Prediabetes: Secondary | ICD-10-CM | POA: Diagnosis not present

## 2023-07-23 DIAGNOSIS — I10 Essential (primary) hypertension: Secondary | ICD-10-CM

## 2023-07-23 DIAGNOSIS — F40240 Claustrophobia: Secondary | ICD-10-CM

## 2023-07-23 LAB — VITAMIN D 25 HYDROXY (VIT D DEFICIENCY, FRACTURES): VITD: 29.24 ng/mL — ABNORMAL LOW (ref 30.00–100.00)

## 2023-07-23 MED ORDER — ALPRAZOLAM 0.5 MG PO TABS: 0.5000 mg | ORAL_TABLET | Freq: Once | ORAL | 0 refills | Status: AC

## 2023-07-23 MED ORDER — ATORVASTATIN CALCIUM 80 MG PO TABS: 80.0000 mg | ORAL_TABLET | Freq: Every day | ORAL | 2 refills | Status: DC

## 2023-07-23 MED ORDER — OXYBUTYNIN CHLORIDE 5 MG PO TABS: 5.0000 mg | ORAL_TABLET | Freq: Two times a day (BID) | ORAL | 2 refills | Status: DC

## 2023-07-23 MED ORDER — AMLODIPINE BESYLATE 5 MG PO TABS: 5.0000 mg | ORAL_TABLET | Freq: Every day | ORAL | 2 refills | Status: DC

## 2023-07-23 MED ORDER — OMEPRAZOLE 20 MG PO CPDR: 20.0000 mg | DELAYED_RELEASE_CAPSULE | Freq: Every day | ORAL | 2 refills | Status: DC

## 2023-07-23 NOTE — Assessment & Plan Note (Signed)
 Not addressed during today's visit.

## 2023-07-23 NOTE — Telephone Encounter (Signed)
 Please call the patient to let her know I was not able to send her medication for claustrophobia at this time due to technical difficulty. Her MRI is after April 20th. Can you please ask patient to reach out to [place] one week before her MRI that way I can send her the prescription medication before she gets MRI.   Thank you,  [place]

## 2023-07-23 NOTE — Assessment & Plan Note (Signed)
 Fasting CMP ordered. Patient to get lab prior to her next office visit in July.

## 2023-07-23 NOTE — Assessment & Plan Note (Addendum)
 Check vitamin D, will make recommendations based on lab result. If vitamin D within normal range will check CBC, B12, iron panel, serum Magnesium, TSH level. Pending CMP

## 2023-07-23 NOTE — Progress Notes (Addendum)
 Established Patient Office Visit   Subjective  Patient ID: CHRISTINNA SPRUNG, female    DOB: 12/12/1940  Age: 83 y.o. MRN: [id number]  Chief Complaint  Patient presents with   Transitions Of Care   She  has a past medical history of Arthritis, Breast cancer (HCC), Cataract (2000), Diverticulitis, Family history of breast cancer, GERD (gastroesophageal reflux disease), Hyperlipidemia, Hypertension, Lymphedema, Personal history of chemotherapy, Personal history of radiation therapy, and Pre-diabetes.  HPI Established patient of Dr. Birdie Sons, she is seen to establish care with me today.    Hypertension: On Amlodipine 5 mg daily. Home BP ranging 125-135 mmHg, DBP 80 mmHg or lower. She denies chest pain, headache. Patient does not drink alcohol, is active with ADLs, eats balanced diet.   GERD: On omeprazole 20 mg, symptoms stable on daily Omeprazole.   Urgent continence: On oxybutynin 5 mg twice a day managed by Dr. Birdie Sons in the past. She denies s/e including dry mouth, constipation. She has not tried pelvic floor exercise in the past.   H/O breast cancer, R side ( invasive mammary seroma): During her last visit with Dr. Birdie Sons mammogram was ordered.  Has upcoming mammogram on 07/25/2023.  Follows up with occupational therapy for right arm lymphedema.  She sees heme/onc for this, is on Arimidex. Completed chemo and radiation therapy in the past. Has a h/o complication from breast surgery (necrosis of wound). She f/u with heme/once every 6 months.   B/L feet arthritis: Chronic since she was in her 30s. Has been stabl for years. Does activities of daily livings by herself. Does not drive.   Claustrophobia: Mostly with procedures like MRI. In the past was treated with oral Valium (5 mg) an hour before procedures. Has an upcoming MRI neck in April.   ROS As per HPI    Objective:     BP 128/74   Pulse 96   Temp 98.1 F (36.7 C) (Oral)   Ht 5\' 3"  (1.6 m)   Wt 194 lb (88 kg)    SpO2 96%   BMI 34.37 kg/m    Physical Exam Constitutional:      General: She is not in acute distress.    Appearance: Normal appearance.  HENT:     Head: Normocephalic and atraumatic.     Mouth/Throat:     Mouth: Mucous membranes are moist.  Eyes:     Conjunctiva/sclera: Conjunctivae normal.  Neck:     Thyroid: No thyroid mass or thyroid tenderness.  Cardiovascular:     Rate and Rhythm: Normal rate and regular rhythm.  Pulmonary:     Effort: Pulmonary effort is normal.     Breath sounds: Normal breath sounds.  Abdominal:     General: Bowel sounds are normal.     Palpations: Abdomen is soft.     Tenderness: There is no abdominal tenderness. There is no guarding.  Musculoskeletal:     Cervical back: Neck supple.     Right lower leg: No edema.     Left lower leg: No edema.  Skin:    General: Skin is warm.  Neurological:     Mental Status: She is alert and oriented to person, place, and time.  Psychiatric:        Mood and Affect: Mood normal.        Behavior: Behavior normal.     No results found for any visits on 07/23/23.  The ASCVD Risk score (Arnett DK, et al., 2019) failed to calculate for the following  reasons:   The 2019 ASCVD risk score is only valid for ages 27 to 8    Assessment & Plan:  Primary hypertension Assessment & Plan: Stable on Amlodipine 5 mg daily. Continue. CMP ordered.    Essential hypertension -     amLODIPine Besylate; Take 1 tablet (5 mg total) by mouth daily.  Dispense: 100 tablet; Refill: 2 -     Comprehensive metabolic panel with GFR; Future  Gastroesophageal reflux disease without esophagitis Assessment & Plan: Stable on Omeprazole 20 mg, continue.   Orders: -     Omeprazole; Take 1 capsule (20 mg total) by mouth daily.  Dispense: 100 capsule; Refill: 2  Urge incontinence of urine Assessment & Plan: Currently on Oxybutynin 5 mg twice a day with symptoms stable. I discussed s/e including dry mouth, constipation, increased risk  of fall, memory changes with the patient. Patient reports she has not have s/e and has tolerated medication well in the past. We will continue this as it is. Also counseled her on pelvic floor exercise during today's visit.    Orders: -     oxyBUTYnin Chloride; Take 1 tablet (5 mg total) by mouth 2 (two) times daily.  Dispense: 200 tablet; Refill: 2  Hyperlipidemia, unspecified hyperlipidemia type Assessment & Plan: Stable on Atorvastatin 80 mg daily. Continue current medication. Recommend checking fasting lipid panel prior to her next visit in July.  Orders: -     Atorvastatin Calcium; Take 1 tablet (80 mg total) by mouth daily.  Dispense: 100 tablet; Refill: 2 -     Lipid panel; Future  Chronic fatigue Assessment & Plan: Check vitamin D, will make recommendations based on lab result. If vitamin D within normal range will check CBC, B12, iron panel, serum Magnesium, TSH level. Pending CMP  Orders: -     CBC with Differential/Platelet; Future -     VITAMIN D 25 Hydroxy (Vit-D Deficiency, Fractures)  Temporal arteritis (HCC) Assessment & Plan: Not addressed during today's visit.    Claustrophobia Assessment & Plan: Mostly related to procedures like MRI. I reviewed [place] pdmp. In the past she has been treated with alprazolam 0.5 mg 1 dose prior to procedure to help with claustrophobia.  Last prescription was on September 2024, 1 tab of alprazolam 0.5 mg.  After reviewing [place] PDMP patient will be treated with 0.5 mg alprazolam, 1 tablet to be taken an hour prior to MRI.  Prescription sent to local pharmacy.   Orders: -     ALPRAZolam; Take 1 tablet (0.5 mg total) by mouth once for 1 dose. Take one tablet 60 minutes before MRI.  Dispense: 1 tablet; Refill: 0  Prediabetes Assessment & Plan: Fasting CMP ordered. Patient to get lab prior to her next office visit in July.      Return in about 3 months (around 10/22/2023) for chronic follow up, fasting labs 2 days before apt.   Skip Estimable, MD

## 2023-07-23 NOTE — Assessment & Plan Note (Signed)
 Stable on Omeprazole 20 mg, continue.

## 2023-07-23 NOTE — Assessment & Plan Note (Signed)
 Currently on Oxybutynin 5 mg twice a day with symptoms stable. I discussed s/e including dry mouth, constipation, increased risk of fall, memory changes with the patient. Patient reports she has not have s/e and has tolerated medication well in the past. We will continue this as it is. Also counseled her on pelvic floor exercise during today's visit.

## 2023-07-23 NOTE — Assessment & Plan Note (Addendum)
 Mostly related to procedures like MRI. I reviewed [place] pdmp. In the past she has been treated with alprazolam 0.5 mg 1 dose prior to procedure to help with claustrophobia.  Last prescription was on September 2024, 1 tab of alprazolam 0.5 mg.  After reviewing [place] PDMP patient will be treated with 0.5 mg alprazolam, 1 tablet to be taken an hour prior to MRI.  Prescription sent to local pharmacy.

## 2023-07-23 NOTE — Telephone Encounter (Signed)
 Spoke to pt. Notified pt to disregards mychart message that her meds was sent in to [facility]

## 2023-07-23 NOTE — Addendum Note (Signed)
 Addended by: Skip Estimable on: 07/23/2023 01:30 PM   Modules accepted: Orders

## 2023-07-23 NOTE — Assessment & Plan Note (Signed)
 Stable on Amlodipine 5 mg daily. Continue. CMP ordered.

## 2023-07-23 NOTE — Assessment & Plan Note (Signed)
 Stable on Atorvastatin 80 mg daily. Continue current medication. Recommend checking fasting lipid panel prior to her next visit in July.

## 2023-07-25 ENCOUNTER — Other Ambulatory Visit: Payer: Self-pay | Admitting: Oncology

## 2023-07-25 ENCOUNTER — Ambulatory Visit
Admission: RE | Admit: 2023-07-25 | Discharge: 2023-07-25 | Disposition: A | Discharge status: Home / Self Care | Source: Ambulatory Visit | Attending: Oncology | Admitting: Oncology

## 2023-07-25 DIAGNOSIS — C50911 Malignant neoplasm of unspecified site of right female breast: Secondary | ICD-10-CM | POA: Insufficient documentation

## 2023-07-25 DIAGNOSIS — Z1231 Encounter for screening mammogram for malignant neoplasm of breast: Secondary | ICD-10-CM | POA: Insufficient documentation

## 2023-07-25 DIAGNOSIS — E785 Hyperlipidemia, unspecified: Secondary | ICD-10-CM

## 2023-07-25 DIAGNOSIS — I10 Essential (primary) hypertension: Secondary | ICD-10-CM

## 2023-08-06 NOTE — Progress Notes (Deleted)
 Office Visit Note  Patient: Theresa Reid             Date of Birth: 1941-04-03           MRN: [id number]             PCP: Skip Estimable, MD Referring: Glori Luis, MD Visit Date: 08/15/2023   Subjective:  No chief complaint on file.   History of Present Illness: Theresa Reid is a 83 y.o. female here for follow up for PMR and generalized osteoarthritis on prednisone 5 mg PRN.    Previous HPI 08/16/2022 Theresa Reid is a 83 y.o. female here for follow up for PMR and generalized osteoarthritis on prednisone 5 mg PRN. She has been taking the prednisone intermittently, when experiencing a flare up takes a tapering dose for less than 2 weeks total duration usually with quick resolution of symptoms. She has daily joint stiffness about 30 minutes. Knee pain bothers her worse on days she spends longer on her feet. She had a recent fall on her stairs at home and hit her back. There was no significant injury found. She has not fallen for greater than 30 years prior to this. She had recent bone density test in February with t-score -1.0.     Previous HPI 09/27/21 Theresa Reid is a 83 y.o. female here for follow up for PMR and generalized osteoarthritis. Since our visit last year she has infrequent exacerbations of symptoms less than monthly which quickly improve on prednisone 5 mg. Each such incident is usually less than 1 week. Besides this symptoms are overall stable. She also reports some right shoulder pain when lying in bed to sleep on that side. She has some increased stiffness and difficulty reaching far above and behind.   Previous HPI 10/01/2020 Theresa Reid is a 83 y.o. female here for evaluation for PMR previously diagnosed 2016 and has been taking prednisone 5 mg PO daily dose and seeing [facility] rheumatology Doreen Salvage for this now transitioning care due to leaving the practice.  Currently she experiences a chronic mild to moderate level of joint pain in  multiple sites with osteoarthritis.  She experiences flareup of joint pain symptoms which she treats with prednisone 5-10 milligrams daily as needed which she estimates is maybe only a few days out of each month.  Her most problematic area is in the right shoulder.  She does have history of right breast cancer treatment of which has some residual right arm lymphedema.  Currently today she is feeling well.   No Rheumatology ROS completed.   PMFS History:  Patient Active Problem List   Diagnosis Date Noted   Temporal arteritis (HCC) 07/23/2023   Claustrophobia 07/23/2023   Lymphedema 04/11/2023   Diverticulitis 03/19/2023   Disequilibrium 12/15/2022   Chronic cough 12/15/2022   Fingernail abnormalities 11/01/2022   Neuropathy 11/01/2022   Palpitations 07/18/2022   Trapezius strain 05/03/2022   History of diverticulitis 05/03/2022   Fatigue 01/04/2022   S/P cervical spinal fusion 11/16/2021   Dens fracture (HCC) 11/04/2021   Pain in right shoulder 09/27/2021   Prediabetes 12/16/2020   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   Cataract 09/24/2018   Urge incontinence of urine 09/24/2018   Malignant neoplasm of right female breast (HCC) 06/07/2018   Family history of breast cancer    Dairy product intolerance 01/14/2018   Seborrheic keratosis 04/10/2017   Dysuria 02/19/2017   Visual disturbance 10/03/2016   GERD (gastroesophageal reflux disease)  09/14/2016   HLD (hyperlipidemia) 08/13/2015   Seborrheic dermatitis 07/01/2015   Hypertension 11/13/2014   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    Past Medical History:  Diagnosis Date   Arthritis    Breast cancer (HCC)    Cataract 2000   Diverticulitis    Family history of breast cancer    GERD (gastroesophageal reflux disease)    Hyperlipidemia    Hypertension    Lymphedema    right arm   Personal history of chemotherapy    Personal history of radiation therapy    Pre-diabetes     Family History  Problem  Relation Age of Onset   Alcohol abuse Mother        deceased 12   Arthritis Mother    Diabetes Father    Heart disease Father    Breast cancer Maternal Aunt        dx 57s; deceased 77s   Breast cancer Maternal Aunt        dx 34s; deceased 34s   Arthritis Maternal Grandmother    Past Surgical History:  Procedure Laterality Date   APPLICATION OF INTRAOPERATIVE CT SCAN N/A 11/16/2021   Procedure: APPLICATION OF INTRAOPERATIVE CT SCAN;  Surgeon: Venetia Night, MD;  Location: [facility] ORS;  Service: Neurosurgery;  Laterality: N/A;   BREAST BIOPSY Right 05/28/2018   [place] bx, INVASIVE MAMMARY CARCINOMA WITH LOBULAR FEATURES   BREAST LUMPECTOMY Right 06/12/2018   [facility], lobular features   BREAST LUMPECTOMY WITH SENTINEL LYMPH NODE BIOPSY Right 06/12/2018   Procedure: RIGHT BREAST WIDE EXCISION WITH SENTINEL LYMPH NODE BX;  Surgeon: Earline Mayotte, MD;  Location: [facility];  Service: General;  Laterality: Right;   CHOLECYSTECTOMY     GALLBLADDER SURGERY  1997   INCISION AND DRAINAGE OF WOUND Right 10/30/2018   Procedure: IRRIGATION AND DEBRIDEMENT RIGHT CHEST WALL WOUND;  Surgeon: Earline Mayotte, MD;  Location: [facility];  Service: General;  Laterality: Right;   JOINT REPLACEMENT  2012 and 2014   both knees   MASTECTOMY Right    MASTECTOMY WITH AXILLARY LYMPH NODE DISSECTION Right 09/20/2018   Procedure: MASTECTOMY WITH AXILLARY LYMPH NODE DISSECTION RIGHT;  Surgeon: Earline Mayotte, MD;  Location: [facility];  Service: General;  Laterality: Right;   ovaraian cyst removal Right    Cyst only (NOT OVARY)   PORTACATH PLACEMENT Left 09/20/2018   Procedure: INSERTION PORT-A-CATH, LEFT;  Surgeon: Earline Mayotte, MD;  Location: [facility];  Service: General;  Laterality: Left;   POSTERIOR CERVICAL FUSION/FORAMINOTOMY N/A 11/16/2021   Procedure: C1-2 POSTERIOR FUSION WITH ORIF (OPEN REDUCTION INTERNAL FIXATION) C2 FRACTURE (GLOBUS KORE FIBER);  Surgeon: Venetia Night, MD;  Location:  [facility];  Service: Neurosurgery;  Laterality: N/A;   SPINE SURGERY  November 16, 2021   TOTAL KNEE ARTHROPLASTY Bilateral 05/05/2010   Social History   Social History Narrative   Retired    Lives by herself    Pets: None   Caffeine- Coffee 2 cups daily, no tea/soda      Immunization History  Administered Date(s) Administered   Fluad Quad(high Dose 65+) 01/06/2019, 01/04/2022   Influenza Split 12/28/2010   Influenza, High Dose Seasonal PF 01/22/2018, 02/02/2023   Influenza, Seasonal, Injecte, Preservative Fre 01/23/2012   Influenza,inj,Quad PF,6+ Mos 02/06/2013, 02/03/2014   Influenza,inj,quad, With Preservative 01/22/2017   Influenza-Unspecified 12/15/2014, 01/12/2016, 12/30/2016, 02/19/2020, 02/07/2021   PFIZER(Purple Top)SARS-COV-2 Vaccination 05/22/2019, 06/13/2019, 04/13/2020   Pfizer Covid-19 Vaccine Bivalent Booster 79yrs & up 02/18/2021, 02/18/2022   Pfizer(Comirnaty)Fall  Seasonal Vaccine 12 years and older 02/13/2022, 02/09/2023   Pneumococcal Conjugate-13 02/11/2016, 03/01/2017   Pneumococcal Polysaccharide-23 01/04/2010, 11/08/2012   Pneumococcal-Unspecified 01/22/2017   Rsv, Bivalent, Protein Subunit Rsvpref,pf Pattricia Bores) 02/02/2023   Tdap 12/30/2016, 01/22/2017   Zoster Recombinant(Shingrix) 03/01/2017, 02/13/2022   Zoster, Live 09/22/2008     Objective: Vital Signs: There were no vitals taken for this visit.   Physical Exam   Musculoskeletal Exam: ***  CDAI Exam: CDAI Score: -- Patient Global: --; Provider Global: -- Swollen: --; Tender: -- Joint Exam 08/15/2023   No joint exam has been documented for this visit   There is currently no information documented on the homunculus. Go to the Rheumatology activity and complete the homunculus joint exam.  Investigation: No additional findings.  Imaging: MM 3D SCREENING MAMMOGRAM UNILATERAL LEFT BREAST Result Date: 07/27/2023 CLINICAL DATA:  Screening. EXAM: DIGITAL SCREENING UNILATERAL LEFT MAMMOGRAM WITH CAD  AND TOMOSYNTHESIS TECHNIQUE: Left screening digital craniocaudal and mediolateral oblique mammograms were obtained. Left screening digital breast tomosynthesis was performed. The images were evaluated with computer-aided detection. COMPARISON:  Previous exam(s). ACR Breast Density Category b: There are scattered areas of fibroglandular density. FINDINGS: The patient has had a right mastectomy. There are no findings suspicious for malignancy. IMPRESSION: No mammographic evidence of malignancy. A result letter of this screening mammogram will be mailed directly to the patient. RECOMMENDATION: Screening mammogram in one year.  (Code:SM-L-62M) BI-RADS CATEGORY  1: Negative. Electronically Signed   By: Allena Ito M.D.   On: 07/27/2023 07:58    Recent Labs: Lab Results  Component Value Date   WBC 7.2 03/22/2023   HGB 14.0 03/22/2023   PLT 236 03/22/2023   NA 140 03/22/2023   K 3.6 03/22/2023   CL 105 03/22/2023   CO2 24 03/22/2023   GLUCOSE 120 (H) 03/22/2023   BUN 11 03/22/2023   CREATININE 0.65 03/22/2023   BILITOT 0.7 03/22/2023   ALKPHOS 107 03/22/2023   AST 22 03/22/2023   ALT 17 03/22/2023   PROT 7.2 03/22/2023   ALBUMIN 3.9 03/22/2023   CALCIUM 9.4 03/22/2023   GFRAA >60 12/08/2019    Speciality Comments: No specialty comments available.  Procedures:  No procedures performed Allergies: Patient has no known allergies.   Assessment / Plan:     Visit Diagnoses: No diagnosis found.  ***  Orders: No orders of the defined types were placed in this encounter.  No orders of the defined types were placed in this encounter.    Follow-Up Instructions: No follow-ups on file.   Glena Landau, RT  Note - This record has been created using [employer].  Chart creation errors have been sought, but may not always  have been located. Such creation errors do not reflect on  the standard of medical care.

## 2023-08-15 ENCOUNTER — Ambulatory Visit: Payer: Medicare Other | Admitting: Internal Medicine

## 2023-08-15 DIAGNOSIS — Z8739 Personal history of other diseases of the musculoskeletal system and connective tissue: Secondary | ICD-10-CM

## 2023-08-15 DIAGNOSIS — M150 Primary generalized (osteo)arthritis: Secondary | ICD-10-CM

## 2023-08-17 ENCOUNTER — Inpatient Hospital Stay: Admission: RE | Admit: 2023-08-17 | Payer: Medicare Other | Source: Ambulatory Visit

## 2023-08-28 ENCOUNTER — Ambulatory Visit
Admission: RE | Admit: 2023-08-28 | Discharge: 2023-08-28 | Disposition: A | Discharge status: Home / Self Care | Source: Ambulatory Visit | Attending: Neurosurgery | Admitting: Neurosurgery

## 2023-08-28 DIAGNOSIS — D329 Benign neoplasm of meninges, unspecified: Secondary | ICD-10-CM

## 2023-08-28 DIAGNOSIS — D320 Benign neoplasm of cerebral meninges: Secondary | ICD-10-CM | POA: Diagnosis not present

## 2023-08-28 MED ORDER — GADOPICLENOL 0.5 MMOL/ML IV SOLN
10.0000 mL | Freq: Once | INTRAVENOUS | Status: AC | PRN
  Administered 2023-08-28: 10 mL via INTRAVENOUS

## 2023-08-30 ENCOUNTER — Encounter: Payer: Medicare Other | Admitting: Nurse Practitioner

## 2023-09-06 ENCOUNTER — Ambulatory Visit: Admitting: Occupational Therapy

## 2023-09-18 ENCOUNTER — Ambulatory Visit: Attending: Oncology | Admitting: Occupational Therapy

## 2023-09-18 DIAGNOSIS — I972 Postmastectomy lymphedema syndrome: Secondary | ICD-10-CM | POA: Insufficient documentation

## 2023-09-20 ENCOUNTER — Encounter: Payer: Self-pay | Admitting: Occupational Therapy

## 2023-09-20 NOTE — Therapy (Signed)
 [facility] [address] [place], [address] Phone: [phone]   Fax:  [phone]  Occupational Therapy Treatment/Recert  Patient Details  Name: Theresa Reid MRN: [id number] Date of Birth: Jul 06, 1940 No data recorded  Encounter Date: 09/18/2023   OT End of Session - 09/20/23 1522     Visit Number 22    Number of Visits 22    Date for OT Re-Evaluation 09/18/23    OT Start Time 1532    OT Stop Time 1603    OT Time Calculation (min) 31 min    Activity Tolerance Patient tolerated treatment well    Behavior During Therapy [facility] for tasks assessed/performed             Past Medical History:  Diagnosis Date   Arthritis    Breast cancer (HCC)    Cataract 2000   Diverticulitis    Family history of breast cancer    GERD (gastroesophageal reflux disease)    Hyperlipidemia    Hypertension    Lymphedema    right arm   Personal history of chemotherapy    Personal history of radiation therapy    Pre-diabetes     Past Surgical History:  Procedure Laterality Date   APPLICATION OF INTRAOPERATIVE CT SCAN N/A 11/16/2021   Procedure: APPLICATION OF INTRAOPERATIVE CT SCAN;  Surgeon: Jodeen Munch, MD;  Location: [facility];  Service: Neurosurgery;  Laterality: N/A;   BREAST BIOPSY Right 05/28/2018   us  bx, INVASIVE MAMMARY CARCINOMA WITH LOBULAR FEATURES   BREAST LUMPECTOMY Right 06/12/2018   [facility], lobular features   BREAST LUMPECTOMY WITH SENTINEL LYMPH NODE BIOPSY Right 06/12/2018   Procedure: RIGHT BREAST WIDE EXCISION WITH SENTINEL LYMPH NODE BX;  Surgeon: Marshall Skeeter, MD;  Location: [facility];  Service: General;  Laterality: Right;   CHOLECYSTECTOMY     GALLBLADDER SURGERY  1997   INCISION AND DRAINAGE OF WOUND Right 10/30/2018   Procedure: IRRIGATION AND DEBRIDEMENT RIGHT CHEST WALL WOUND;  Surgeon: Marshall Skeeter, MD;  Location: [facility];  Service: General;  Laterality: Right;   JOINT  REPLACEMENT  2012 and 2014   both knees   MASTECTOMY Right    MASTECTOMY WITH AXILLARY LYMPH NODE DISSECTION Right 09/20/2018   Procedure: MASTECTOMY WITH AXILLARY LYMPH NODE DISSECTION RIGHT;  Surgeon: Marshall Skeeter, MD;  Location: [facility];  Service: General;  Laterality: Right;   ovaraian cyst removal Right    Cyst only (NOT OVARY)   PORTACATH PLACEMENT Left 09/20/2018   Procedure: INSERTION PORT-A-CATH, LEFT;  Surgeon: Marshall Skeeter, MD;  Location: [facility];  Service: General;  Laterality: Left;   POSTERIOR CERVICAL FUSION/FORAMINOTOMY N/A 11/16/2021   Procedure: C1-2 POSTERIOR FUSION WITH ORIF (OPEN REDUCTION INTERNAL FIXATION) C2 FRACTURE (GLOBUS KORE FIBER);  Surgeon: Jodeen Munch, MD;  Location: [facility];  Service: Neurosurgery;  Laterality: N/A;   SPINE SURGERY  November 16, 2021   TOTAL KNEE ARTHROPLASTY Bilateral 05/05/2010    There were no vitals filed for this visit.   ADL: Patient arrived with new Jobst Elvarex soft sleeve, she reports being refitted for another sleeve since the last one was too short.  This sleeve is a better fit.  She does have some occasional difficulty with putting it on since she has arthritis but she also has a device like slippy gator to use which helps.    Measurements taken and compared to last visit, pt with some mild reductions in most areas and  significant reduction in upper arm measurement.  She can continue to wear Jovi pak at night or during the day if needed.  She has not been wearing glove/gauntlet every time but numbers are staying down in the digits and hand.  Discussed watching this and if she notices increases or jewelry tighter then she may need to wear the glove more consistently.           OT Short Term Goals - 07/11/21 1502       OT SHORT TERM GOAL #1   Title Pt's R UE circumference decrease by at least 1cm -2 cm from hand to upper arm - to be fitted for appropriate compression garments    Status Achieved                OT Long Term Goals - 09/20/23 1554       OT LONG TERM GOAL #2   Title Pt maintain her R UE and thoracic lymphedema  circumference with correct compression garments and HEP    Baseline Pt cont to wear  Jobst  Elvarex soft compression sleeve with glove -as well as jovipak breast pad- measurements WNL compare to L and in past- pt in need for new compression sleeve    Time 8    Period Weeks    Status Achieved    Target Date 09/10/23              Plan - 09/20/23 1545     Clinical Impression Statement Pt with diagnosis of R UE lymphedema - she done great thru the years to maintain her R UE circumference with wearing daily Jobst Elvarex sleeve and glove and  her Unilateral post mastectomy jovipak breast pad as needed- was fitted in Nov 23 last time. . Circumference in R UE compare to the L  and in the past  WNL using Jobst Elvarex soft sleeve and glove. Recommend for her to use her Jovipak breastpad  as needed night time or day time to  clear thoracic lymphedema. Pt arrived with a newly made garment since the last one was too short for her.  This garment was reassessed and appears to be a good fit for her, her measurements were taken with comparision and circumferential measurements were decreased and contained with new garment.  She will need a new garment in 4-6 months with regular wear.  Please reconsult if needs arise.    OT Occupational Profile and History Problem Focused Assessment - Including review of records relating to presenting problem    Occupational performance deficits (Please refer to evaluation for details): ADL's;Play;Leisure;IADL's    Body Structure / Function / Physical Skills ADL;UE functional use;Scar mobility;Skin integrity;Edema    Rehab Potential Good    Clinical Decision Making Several treatment options, min-mod task modification necessary    Comorbidities Affecting Occupational Performance: May have comorbidities impacting occupational performance     Modification or Assistance to Complete Evaluation  No modification of tasks or assist necessary to complete eval    OT Frequency One time visit   2 visits   OT Duration Other (comment)   one time follow up after receiving garment.   OT Treatment/Interventions Self-care/ADL training;Manual lymph drainage;Therapeutic exercise;Patient/family education;Manual Therapy;Compression bandaging    Consulted and Agree with Plan of Care Patient            Patient will benefit from skilled therapeutic intervention in order to improve the following deficits and impairments:   Body Structure / Function / Physical Skills:  ADL, UE functional use, Scar mobility, Skin integrity, EdemaVisit Diagnosis: Postmastectomy lymphedema syndrome - Plan: Ot plan of care cert/re-cert    Problem List Patient Active Problem List   Diagnosis Date Noted   Temporal arteritis (HCC) 07/23/2023   Claustrophobia 07/23/2023   Lymphedema 04/11/2023   Diverticulitis 03/19/2023   Disequilibrium 12/15/2022   Chronic cough 12/15/2022   Fingernail abnormalities 11/01/2022   Neuropathy 11/01/2022   Palpitations 07/18/2022   Trapezius strain 05/03/2022   History of diverticulitis 05/03/2022   Fatigue 01/04/2022   S/P cervical spinal fusion 11/16/2021   Dens fracture (HCC) 11/04/2021   Pain in right shoulder 09/27/2021   Prediabetes 12/16/2020   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   Cataract 09/24/2018   Urge incontinence of urine 09/24/2018   Malignant neoplasm of right female breast (HCC) 06/07/2018   Family history of breast cancer    Dairy product intolerance 01/14/2018   Seborrheic keratosis 04/10/2017   Dysuria 02/19/2017   Visual disturbance 10/03/2016   GERD (gastroesophageal reflux disease) 09/14/2016   HLD (hyperlipidemia) 08/13/2015   Seborrheic dermatitis 07/01/2015   Hypertension 11/13/2014   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    Celedonio Sortino, OTR/L,CLT 09/20/2023, 4:04 PM  [facility] [address] [place], [address] Phone: [phone]   Fax:  [phone]  Name: DANEILLE DESILVA MRN: [id number] Date of Birth: Mar 23, 1941

## 2023-09-24 ENCOUNTER — Ambulatory Visit: Payer: Medicare Other | Admitting: Family Medicine

## 2023-09-27 ENCOUNTER — Ambulatory Visit: Admitting: Neurosurgery

## 2023-09-27 ENCOUNTER — Encounter: Payer: Self-pay | Admitting: Neurosurgery

## 2023-09-27 VITALS — BP 144/82 | Ht 63.0 in | Wt 194.0 lb

## 2023-09-27 DIAGNOSIS — D329 Benign neoplasm of meninges, unspecified: Secondary | ICD-10-CM

## 2023-09-27 DIAGNOSIS — M542 Cervicalgia: Secondary | ICD-10-CM

## 2023-09-27 NOTE — Progress Notes (Signed)
 Referring Physician:  No referring provider defined for this encounter.  Primary Physician:  Jacklin Mascot, MD  History of Present Illness: 09/27/2023 She is having occasional neck discomfort.  She is taking Tylenol  for that.  She comes in today for imaging evaluation.   02/15/2023 [name] is here today with a chief complaint of incidentally found lesion in her brain.  She was suffering from problems with balance and had an MRI scan approximately 1 month ago.  She is currently feeling better.  She is being very careful with her walking.  She has no headaches, nausea, or vomiting.  She has no additional neurologic complaints.  She is still doing well from her neck surgery.   Past Surgery: C1-2 PSF with ORIF on 11/16/21   Review of Systems:  A 10 point review of systems is negative, except for the pertinent positives and negatives detailed in the HPI.  Past Medical History: Past Medical History:  Diagnosis Date   Arthritis    Breast cancer (HCC)    Cataract 2000   Diverticulitis    Family history of breast cancer    GERD (gastroesophageal reflux disease)    Hyperlipidemia    Hypertension    Lymphedema    right arm   Personal history of chemotherapy    Personal history of radiation therapy    Pre-diabetes     Past Surgical History: Past Surgical History:  Procedure Laterality Date   APPLICATION OF INTRAOPERATIVE CT SCAN N/A 11/16/2021   Procedure: APPLICATION OF INTRAOPERATIVE CT SCAN;  Surgeon: Jodeen Munch, MD;  Location: [facility];  Service: Neurosurgery;  Laterality: N/A;   BREAST BIOPSY Right 05/28/2018   us  bx, INVASIVE MAMMARY CARCINOMA WITH LOBULAR FEATURES   BREAST LUMPECTOMY Right 06/12/2018   [facility], lobular features   BREAST LUMPECTOMY WITH SENTINEL LYMPH NODE BIOPSY Right 06/12/2018   Procedure: RIGHT BREAST WIDE EXCISION WITH SENTINEL LYMPH NODE BX;  Surgeon: Marshall Skeeter, MD;  Location: [facility];  Service: General;  Laterality: Right;    CHOLECYSTECTOMY     GALLBLADDER SURGERY  1997   INCISION AND DRAINAGE OF WOUND Right 10/30/2018   Procedure: IRRIGATION AND DEBRIDEMENT RIGHT CHEST WALL WOUND;  Surgeon: Marshall Skeeter, MD;  Location: [facility];  Service: General;  Laterality: Right;   JOINT REPLACEMENT  2012 and 2014   both knees   MASTECTOMY Right    MASTECTOMY WITH AXILLARY LYMPH NODE DISSECTION Right 09/20/2018   Procedure: MASTECTOMY WITH AXILLARY LYMPH NODE DISSECTION RIGHT;  Surgeon: Marshall Skeeter, MD;  Location: [facility];  Service: General;  Laterality: Right;   ovaraian cyst removal Right    Cyst only (NOT OVARY)   PORTACATH PLACEMENT Left 09/20/2018   Procedure: INSERTION PORT-A-CATH, LEFT;  Surgeon: Marshall Skeeter, MD;  Location: [facility];  Service: General;  Laterality: Left;   POSTERIOR CERVICAL FUSION/FORAMINOTOMY N/A 11/16/2021   Procedure: C1-2 POSTERIOR FUSION WITH ORIF (OPEN REDUCTION INTERNAL FIXATION) C2 FRACTURE (GLOBUS KORE FIBER);  Surgeon: Jodeen Munch, MD;  Location: [facility];  Service: Neurosurgery;  Laterality: N/A;   SPINE SURGERY  November 16, 2021   TOTAL KNEE ARTHROPLASTY Bilateral 05/05/2010    Allergies: Allergies as of 09/27/2023   (No Known Allergies)    Medications:  Current Outpatient Medications:    acetaminophen  (TYLENOL ) 325 MG tablet, Take 2 tablets (650 mg total) by mouth every 6 (six) hours as needed for mild pain (or Fever >/= 101)., Disp: , Rfl:    amLODipine  (NORVASC )  5 MG tablet, Take 1 tablet (5 mg total) by mouth daily., Disp: 100 tablet, Rfl: 2   anastrozole  (ARIMIDEX ) 1 MG tablet, TAKE 1 TABLET BY MOUTH DAILY, Disp: 100 tablet, Rfl: 2   atorvastatin  (LIPITOR) 80 MG tablet, Take 1 tablet (80 mg total) by mouth daily., Disp: 100 tablet, Rfl: 2   CALCIUM -VITAMIN D  PO, Take 1 tablet by mouth daily. , Disp: , Rfl:    Multiple Vitamins tablet, Take 1 tablet by mouth daily. , Disp: , Rfl:    omeprazole  (PRILOSEC) 20 MG capsule, Take 1 capsule (20 mg total)  by mouth daily., Disp: 100 capsule, Rfl: 2   oxybutynin  (DITROPAN ) 5 MG tablet, Take 1 tablet (5 mg total) by mouth 2 (two) times daily., Disp: 200 tablet, Rfl: 2   predniSONE  (DELTASONE ) 5 MG tablet, Take 5 mg by mouth as needed., Disp: , Rfl:   Social History: Social History   Tobacco Use   Smoking status: Never    Passive exposure: Never   Smokeless tobacco: Never  Vaping Use   Vaping status: Never Used  Substance Use Topics   Alcohol use: No    Alcohol/week: 0.0 standard drinks of alcohol   Drug use: No    Family Medical History: Family History  Problem Relation Age of Onset   Alcohol abuse Mother        deceased 58   Arthritis Mother    Diabetes Father    Heart disease Father    Breast cancer Maternal Aunt        dx 60s; deceased 83s   Breast cancer Maternal Aunt        dx 74s; deceased 72s   Arthritis Maternal Grandmother     Physical Examination: Vitals:   09/27/23 1123  BP: (!) 144/82    General: Patient is in no apparent distress. Attention to examination is appropriate.  Respiratory: Patient is breathing without any difficulty.   NEUROLOGICAL:     Awake, alert, oriented to person, place, and time.  Speech is clear and fluent.   Cranial Nerves: intact  Strength: MAEW  Bilateral upper and lower extremity sensation is intact to light touch.    No evidence of dysmetria noted.  Gait is abnormal - requires cane.     Medical Decision Making  Imaging: MRI Brain 01/12/2023 IMPRESSION: 1. No acute brain finding. Mild chronic small-vessel ischemic change of the cerebral hemispheric white matter, often seen at this age. 2. 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 No significant mass-effect upon the cerebellum. 3. Previous ORIF for treatment of C1 and C2 fractures. No apparent compromise of the foramen magnum or upper cervical spinal canal.      Electronically Signed   By: Bettylou Brunner M.D.   On: 01/31/2023 15:35  MRI Brain 08/28/2023 IMPRESSION: 1. 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 8 mm nodular component. No adjacent cerebellar edema. 2. No acute intracranial abnormality. No evidence of metastatic disease.     Electronically Signed   By: Marlise Simpers M.D.   On: 09/21/2023 09:21 I have personally reviewed the images and agree with the above interpretation.  Assessment and Plan: Theresa Reid is a pleasant 83 y.o. female with small posterior fossa lesion consistent with meningioma.  I have reviewed her CT scans from 2018 and 2023.  In both of those prior CT scans, there is a lesion in the  area of this tumor.  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  Her repeat MRI scan is stable.  I recommended no further imaging.  For her neck, I have suggested using ibuprofen in addition to Tylenol  as that may help with her musculoskeletal pain.  If her pain continues, I have asked that she reach out and we will consider further interventions.  I spent a total of 10 minutes in this patient's care today. This time was spent reviewing pertinent records including imaging studies, obtaining and confirming history, performing a directed evaluation, formulating and discussing my recommendations, and documenting the visit within the medical record.    Thank you for involving me in the care of this patient.      Virgel Haro K. Mont Antis MD, [facility]

## 2023-10-19 ENCOUNTER — Other Ambulatory Visit (INDEPENDENT_AMBULATORY_CARE_PROVIDER_SITE_OTHER)

## 2023-10-19 DIAGNOSIS — I10 Essential (primary) hypertension: Secondary | ICD-10-CM

## 2023-10-19 DIAGNOSIS — E785 Hyperlipidemia, unspecified: Secondary | ICD-10-CM | POA: Diagnosis not present

## 2023-10-19 DIAGNOSIS — R5382 Chronic fatigue, unspecified: Secondary | ICD-10-CM

## 2023-10-19 LAB — CBC WITH DIFFERENTIAL/PLATELET
Basophils Absolute: 0 10*3/uL (ref 0.0–0.1)
Basophils Relative: 0.7 % (ref 0.0–3.0)
Eosinophils Absolute: 0.2 10*3/uL (ref 0.0–0.7)
Eosinophils Relative: 2.9 % (ref 0.0–5.0)
HCT: 41.4 % (ref 36.0–46.0)
Hemoglobin: 13.6 g/dL (ref 12.0–15.0)
Lymphocytes Relative: 33 % (ref 12.0–46.0)
Lymphs Abs: 2.4 10*3/uL (ref 0.7–4.0)
MCHC: 32.8 g/dL (ref 30.0–36.0)
MCV: 87.6 fl (ref 78.0–100.0)
Monocytes Absolute: 0.7 10*3/uL (ref 0.1–1.0)
Monocytes Relative: 9.1 % (ref 3.0–12.0)
Neutro Abs: 3.9 10*3/uL (ref 1.4–7.7)
Neutrophils Relative %: 54.3 % (ref 43.0–77.0)
Platelets: 219 10*3/uL (ref 150.0–400.0)
RBC: 4.73 Mil/uL (ref 3.87–5.11)
RDW: 15.4 % (ref 11.5–15.5)
WBC: 7.1 10*3/uL (ref 4.0–10.5)

## 2023-10-19 LAB — LIPID PANEL
Cholesterol: 156 mg/dL (ref 0–200)
HDL: 49.9 mg/dL (ref >39.00)
LDL Cholesterol: 75 mg/dL (ref 0–99)
NonHDL: 106.47
Total CHOL/HDL Ratio: 3
Triglycerides: 155 mg/dL — ABNORMAL HIGH (ref 0.0–149.0)
VLDL: 31 mg/dL (ref 0.0–40.0)

## 2023-10-19 LAB — COMPREHENSIVE METABOLIC PANEL WITH GFR
ALT: 11 U/L (ref 0–35)
AST: 17 U/L (ref 0–37)
Albumin: 4 g/dL (ref 3.5–5.2)
Alkaline Phosphatase: 101 U/L (ref 39–117)
BUN: 11 mg/dL (ref 6–23)
CO2: 30 meq/L (ref 19–32)
Calcium: 9.3 mg/dL (ref 8.4–10.5)
Chloride: 104 meq/L (ref 96–112)
Creatinine, Ser: 0.5 mg/dL (ref 0.40–1.20)
GFR: 86.09 mL/min (ref >60.00)
Glucose, Bld: 102 mg/dL — ABNORMAL HIGH (ref 70–99)
Potassium: 4 meq/L (ref 3.5–5.1)
Sodium: 141 meq/L (ref 135–145)
Total Bilirubin: 0.5 mg/dL (ref 0.2–1.2)
Total Protein: 7 g/dL (ref 6.0–8.3)

## 2023-10-22 ENCOUNTER — Ambulatory Visit: Payer: Self-pay

## 2023-10-22 NOTE — Progress Notes (Signed)
 To be discussed during appointment on 10/23/23.   Luke Shade, MD

## 2023-10-23 ENCOUNTER — Ambulatory Visit

## 2023-10-23 ENCOUNTER — Ambulatory Visit (INDEPENDENT_AMBULATORY_CARE_PROVIDER_SITE_OTHER)

## 2023-10-23 VITALS — BP 124/68 | HR 93 | Temp 98.1°F | Ht 63.0 in | Wt 194.8 lb

## 2023-10-23 DIAGNOSIS — E782 Mixed hyperlipidemia: Secondary | ICD-10-CM | POA: Diagnosis not present

## 2023-10-23 DIAGNOSIS — R42 Dizziness and giddiness: Secondary | ICD-10-CM

## 2023-10-23 DIAGNOSIS — Z8739 Personal history of other diseases of the musculoskeletal system and connective tissue: Secondary | ICD-10-CM

## 2023-10-23 DIAGNOSIS — I10 Essential (primary) hypertension: Secondary | ICD-10-CM

## 2023-10-23 NOTE — Assessment & Plan Note (Addendum)
 Undetermined cause. D/D includes arthritis, BPPV, cardiovascular disease, medication s/e.  MRI: last MRI was on 08/28/23. 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 Has seen neurosurgery for this and is recommended no intervention as it has been stable.  Recommend physical therapy to help with balance and strengthening exercise.  If physical therapy does not improve in symptoms recommend neurology referral, adjusting medications to reduce risk of fall. F/U in 3-4 months recommended.

## 2023-10-23 NOTE — Progress Notes (Signed)
 Established Patient Office Visit   Subjective  Patient ID: Theresa Reid, female    DOB: Oct 18, 1940  Age: 83 y.o. MRN: [id number]  Chief Complaint  Patient presents with   Gait Problem   Hyperlipidemia   Hypertension    She  has a past medical history of Arthritis, Breast cancer (HCC), Cataract (2000), Chronic cough (12/15/2022), Diverticulitis, Family history of breast cancer, GERD (gastroesophageal reflux disease), Hyperlipidemia, Hypertension, Lymphedema, Personal history of chemotherapy, Personal history of radiation therapy, and Pre-diabetes.  HPI 1) Feeling unsteady: Lasting for about 3-4 seconds, mostly feeling like she is going to fall on the left side. Episode first happened last fall. She denies change in vision, chest pain, palpations when these episodes are happening. No obvious trigger. No vertigo when these symptoms are happening. She has not sustained a fall. She is using a walker/cane for ambulation. Last episode was around May 2025.  She has a history of meningioma, last MRI was on 08/28/23. 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 8 mm nodular component. Has seen neurosurgery for this as it has been stable.   2) 2018, acute vision change on right side. There was concern for Temporal arteritis but further evaluation was negative. 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 Continues to f/u with Dr. Myrna.    ROS As per HPI    Objective:     BP 124/68 (BP Location: Left Arm, Patient Position: Sitting, Cuff Size: Normal)   Pulse 93   Temp 98.1 F (36.7 C) (Oral)   Ht 5' 3 (1.6 m)   Wt 194 lb 12.8 oz (88.4 kg)   SpO2 96%   BMI 34.51 kg/m      10/23/2023    1:03 PM 07/23/2023   10:55 AM 06/11/2023   10:21 AM  Depression screen PHQ 2/9  Decreased Interest 0 0 0  Down, Depressed, Hopeless 0 0 0  PHQ - 2 Score 0 0 0  Altered sleeping 0 0 0  Tired, decreased energy 0 0 1  Change in appetite 0 0 0  Feeling bad or  failure about yourself  0 0 0  Trouble concentrating 0 0 0  Moving slowly or fidgety/restless 0 0 0  Suicidal thoughts 0 0 0  PHQ-9 Score 0 0 1  Difficult doing work/chores Not difficult at all  Not difficult at all      10/23/2023    1:03 PM 07/23/2023   10:55 AM 04/11/2023    1:52 PM 03/19/2023    1:05 PM  GAD 7 : Generalized Anxiety Score  Nervous, Anxious, on Edge 0 0 0 0  Control/stop worrying 0 0 0 0  Worry too much - different things 0 0 0 0  Trouble relaxing 0 0 0 0  Restless 0 0 0 0  Easily annoyed or irritable 0 0 0 0  Afraid - awful might happen 0 0 0 0  Total GAD 7 Score 0 0 0 0  Anxiety Difficulty Not difficult at all  Not difficult at all Not difficult at all      10/23/2023    1:03 PM 07/23/2023   10:55 AM 06/11/2023   10:21 AM  Depression screen PHQ 2/9  Decreased Interest 0 0 0  Down, Depressed, Hopeless 0 0 0  PHQ - 2 Score 0 0 0  Altered sleeping 0 0 0  Tired, decreased energy 0 0 1  Change in appetite 0 0 0  Feeling bad or failure about yourself  0 0 0  Trouble concentrating 0 0 0  Moving slowly or fidgety/restless 0 0 0  Suicidal thoughts 0 0 0  PHQ-9 Score 0 0 1  Difficult doing work/chores Not difficult at all  Not difficult at all      10/23/2023    1:03 PM 07/23/2023   10:55 AM 04/11/2023    1:52 PM 03/19/2023    1:05 PM  GAD 7 : Generalized Anxiety Score  Nervous, Anxious, on Edge 0 0 0 0  Control/stop worrying 0 0 0 0  Worry too much - different things 0 0 0 0  Trouble relaxing 0 0 0 0  Restless 0 0 0 0  Easily annoyed or irritable 0 0 0 0  Afraid - awful might happen 0 0 0 0  Total GAD 7 Score 0 0 0 0  Anxiety Difficulty Not difficult at all  Not difficult at all Not difficult at all   SDOH Screenings   Food Insecurity: No Food Insecurity (10/22/2023)  Housing: Low Risk  (10/22/2023)  Transportation Needs: No Transportation Needs (10/22/2023)  Utilities: Not At Risk (06/11/2023)  Alcohol Screen: Low Risk  (06/11/2023)  Depression  (PHQ2-9): Low Risk  (10/23/2023)  Financial Resource Strain: Low Risk  (10/22/2023)  Physical Activity: Insufficiently Active (10/22/2023)  Social Connections: Socially Isolated (10/22/2023)  Stress: No Stress Concern Present (10/22/2023)  Tobacco Use: Low Risk  (10/23/2023)  Health Literacy: Adequate Health Literacy (06/11/2023)     Physical Exam Constitutional:      Appearance: Normal appearance.  HENT:     Head: Normocephalic and atraumatic.     Mouth/Throat:     Mouth: Mucous membranes are moist.  Neck:     Thyroid : No thyroid  mass or thyroid  tenderness.   Cardiovascular:     Rate and Rhythm: Normal rate and regular rhythm.  Pulmonary:     Effort: Pulmonary effort is normal.     Breath sounds: Normal breath sounds.  Abdominal:     General: Bowel sounds are normal.     Palpations: Abdomen is soft.   Musculoskeletal:     Cervical back: Neck supple. No rigidity.     Right lower leg: No edema.     Left lower leg: No edema.   Skin:    General: Skin is warm.   Neurological:     Mental Status: She is alert and oriented to person, place, and time.     Cranial Nerves: Cranial nerves 2-12 are intact. No dysarthria or facial asymmetry.     Sensory: No sensory deficit.     Gait: Gait abnormal (antalgic gait, favors right over left) and tandem walk abnormal.   Psychiatric:        Mood and Affect: Mood normal.        Behavior: Behavior normal. Behavior is cooperative.        No results found for any visits on 10/23/23.  The ASCVD Risk score (Arnett DK, et al., 2019) failed to calculate for the following reasons:   The 2019 ASCVD risk score is only valid for ages 43 to 61    Following lab results discussed during today's visit:  Component     Latest Ref Rng 10/19/2023  WBC     4.0 - 10.5 K/uL 7.1   RBC     3.87 - 5.11 Mil/uL 4.73   Hemoglobin     12.0 - 15.0 g/dL 86.3   HCT     63.9 -  46.0 % 41.4   MCV     78.0 - 100.0 fl 87.6   MCHC     30.0 - 36.0 g/dL 67.1    RDW     88.4 - 15.5 % 15.4   Platelets     150.0 - 400.0 K/uL 219.0   Neutrophils     43.0 - 77.0 % 54.3   Lymphocytes     12.0 - 46.0 % 33.0   Monocytes Relative     3.0 - 12.0 % 9.1   Eosinophil     0.0 - 5.0 % 2.9   Basophil     0.0 - 3.0 % 0.7   NEUT#     1.4 - 7.7 K/uL 3.9   Lymphs Abs     0.7 - 4.0 K/uL 2.4   Monocyte #     0.1 - 1.0 K/uL 0.7   Eosinophils Absolute     0.0 - 0.7 K/uL 0.2   Basophils Absolute     0.0 - 0.1 K/uL 0.0   Sodium     135 - 145 mEq/L 141   Potassium     3.5 - 5.1 mEq/L 4.0   Chloride     96 - 112 mEq/L 104   CO2     19 - 32 mEq/L 30   Glucose     70 - 99 mg/dL 897 (H)   BUN     6 - 23 mg/dL 11   Creatinine     9.59 - 1.20 mg/dL 9.49   Total Bilirubin     0.2 - 1.2 mg/dL 0.5   Alkaline Phosphatase     39 - 117 U/L 101   AST     0 - 37 U/L 17   ALT     0 - 35 U/L 11   Total Protein     6.0 - 8.3 g/dL 7.0   Albumin     3.5 - 5.2 g/dL 4.0   GFR     >39.99 mL/min 86.09   Calcium      8.4 - 10.5 mg/dL 9.3   Cholesterol     0 - 200 mg/dL 843   Triglycerides     0.0 - 149.0 mg/dL 844.9 (H)   HDL Cholesterol     >39.00 mg/dL 50.09   VLDL     0.0 - 40.0 mg/dL 68.9   LDL (calc)     0 - 99 mg/dL 75   Total CHOL/HDL Ratio 3   NonHDL 106.47     Assessment & Plan:   Disequilibrium Assessment & Plan: Undetermined cause. D/D includes arthritis, BPPV, cardiovascular disease, medication s/e.  MRI: last MRI was on 08/28/23. 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 Has seen neurosurgery for this and is recommended no intervention as it has been stable.  Recommend physical therapy to help with balance and strengthening exercise.  If physical therapy does not improve in symptoms recommend neurology referral, adjusting medications to reduce risk of fall. F/U in 3-4 months recommended.   Orders: -     Ambulatory referral to Physical Therapy  History of polymyalgia rheumatica Assessment & Plan: Established with  rheumatology, takes Prednisone  tapering dose when symptoms flares up. Continue annual f/u with rheumatology.    Mixed hyperlipidemia Assessment & Plan: LDL 75 on recent lab. Continue Lipitor 80 mg daily.    Primary hypertension Assessment & Plan: BP stable, continue Amlodipine  5 mg daily.     Return for 3-4 months for chronic follow  up.   Shemeika Starzyk, MD

## 2023-10-23 NOTE — Assessment & Plan Note (Signed)
 Established with rheumatology, takes Prednisone  tapering dose when symptoms flares up. Continue annual f/u with rheumatology.

## 2023-10-23 NOTE — Assessment & Plan Note (Signed)
 BP stable, continue Amlodipine  5 mg daily.

## 2023-10-23 NOTE — Patient Instructions (Signed)
-   Physical therapy referral was done today to help with balance, reduce risk of fall.  - Recommend follow up in 3-4 months to assess on how your balance has been. If dizziness symptoms happing more frequently or if you have chest pain, palpitations (fast heartbeat) recommend seeing me sooner.

## 2023-10-23 NOTE — Assessment & Plan Note (Signed)
 LDL 75 on recent lab. Continue Lipitor 80 mg daily.

## 2023-11-12 DIAGNOSIS — H2513 Age-related nuclear cataract, bilateral: Secondary | ICD-10-CM | POA: Diagnosis not present

## 2023-11-12 DIAGNOSIS — H25013 Cortical age-related cataract, bilateral: Secondary | ICD-10-CM | POA: Diagnosis not present

## 2023-11-12 NOTE — Progress Notes (Deleted)
 Office Visit Note  Patient: Theresa Reid             Date of Birth: 11-19-1940           MRN: [id number]             PCP: Abbey Bruckner, MD Referring: Maribeth Camellia MATSU, MD Visit Date: 11/19/2023   Subjective:  No chief complaint on file.   History of Present Illness: Theresa Reid is a 83 y.o. female here for follow up for PMR and generalized osteoarthritis on prednisone  5 mg PRN.    Previous HPI 08/16/2022 Theresa Reid is a 83 y.o. female here for follow up for PMR and generalized osteoarthritis on prednisone  5 mg PRN. She has been taking the prednisone  intermittently, when experiencing a flare up takes a tapering dose for less than 2 weeks total duration usually with quick resolution of symptoms. She has daily joint stiffness about 30 minutes. Knee pain bothers her worse on days she spends longer on her feet. She had a recent fall on her stairs at home and hit her back. There was no significant injury found. She has not fallen for greater than 30 years prior to this. She had recent bone density test in February with t-score -1.0.     Previous HPI 09/27/21 Theresa Reid is a 83 y.o. female here for follow up for PMR and generalized osteoarthritis. Since our visit last year she has infrequent exacerbations of symptoms less than monthly which quickly improve on prednisone  5 mg. Each such incident is usually less than 1 week. Besides this symptoms are overall stable. She also reports some right shoulder pain when lying in bed to sleep on that side. She has some increased stiffness and difficulty reaching far above and behind.   Previous HPI 10/01/2020 Theresa Reid is a 83 y.o. female here for evaluation for PMR previously diagnosed 2016 and has been taking prednisone  5 mg PO daily dose and seeing [facility] rheumatology Linda Belhorn for this now transitioning care due to leaving the practice.  Currently she experiences a chronic mild to moderate level of joint pain in  multiple sites with osteoarthritis.  She experiences flareup of joint pain symptoms which she treats with prednisone  5-10 milligrams daily as needed which she estimates is maybe only a few days out of each month.  Her most problematic area is in the right shoulder.  She does have history of right breast cancer treatment of which has some residual right arm lymphedema.  Currently today she is feeling well.   No Rheumatology ROS completed.   PMFS History:  Patient Active Problem List   Diagnosis Date Noted   Claustrophobia 07/23/2023   Lymphedema 04/11/2023   Diverticulitis 03/19/2023   Disequilibrium 12/15/2022   Fingernail abnormalities 11/01/2022   Neuropathy 11/01/2022   Palpitations 07/18/2022   Trapezius strain 05/03/2022   History of diverticulitis 05/03/2022   Fatigue 01/04/2022   S/P cervical spinal fusion 11/16/2021   Dens fracture (HCC) 11/04/2021   Pain in right shoulder 09/27/2021   Prediabetes 12/16/2020   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   Cataract 09/24/2018   Urge incontinence of urine 09/24/2018   Malignant neoplasm of right female breast (HCC) 06/07/2018   Family history of breast cancer    Dairy product intolerance 01/14/2018   Visual disturbance 10/03/2016   GERD (gastroesophageal reflux disease) 09/14/2016   HLD (hyperlipidemia) 08/13/2015   Seborrheic dermatitis 07/01/2015   Hypertension 11/13/2014   Osteoarthritis of multiple  joints 11/13/2014    Past Medical History:  Diagnosis Date   Arthritis    Breast cancer (HCC)    Cataract 2000   Chronic cough 12/15/2022   Diverticulitis    Dysuria 02/19/2017   Family history of breast cancer    GERD (gastroesophageal reflux disease)    Hyperlipidemia    Hypertension    Lymphedema    right arm   Personal history of chemotherapy    Personal history of radiation therapy    Pre-diabetes     Family History  Problem Relation Age of Onset   Alcohol abuse Mother        deceased 55   Arthritis  Mother    Diabetes Father    Heart disease Father    Breast cancer Maternal Aunt        dx 61s; deceased 46s   Breast cancer Maternal Aunt        dx 48s; deceased 19s   Arthritis Maternal Grandmother    Past Surgical History:  Procedure Laterality Date   APPLICATION OF INTRAOPERATIVE CT SCAN N/A 11/16/2021   Procedure: APPLICATION OF INTRAOPERATIVE CT SCAN;  Surgeon: Clois Fret, MD;  Location: [facility];  Service: Neurosurgery;  Laterality: N/A;   BREAST BIOPSY Right 05/28/2018   us  bx, INVASIVE MAMMARY CARCINOMA WITH LOBULAR FEATURES   BREAST LUMPECTOMY Right 06/12/2018   [facility], lobular features   BREAST LUMPECTOMY WITH SENTINEL LYMPH NODE BIOPSY Right 06/12/2018   Procedure: RIGHT BREAST WIDE EXCISION WITH SENTINEL LYMPH NODE BX;  Surgeon: Dessa Reyes ORN, MD;  Location: [facility];  Service: General;  Laterality: Right;   CHOLECYSTECTOMY     GALLBLADDER SURGERY  1997   INCISION AND DRAINAGE OF WOUND Right 10/30/2018   Procedure: IRRIGATION AND DEBRIDEMENT RIGHT CHEST WALL WOUND;  Surgeon: Dessa Reyes ORN, MD;  Location: [facility];  Service: General;  Laterality: Right;   JOINT REPLACEMENT  2012 and 2014   both knees   MASTECTOMY Right    MASTECTOMY WITH AXILLARY LYMPH NODE DISSECTION Right 09/20/2018   Procedure: MASTECTOMY WITH AXILLARY LYMPH NODE DISSECTION RIGHT;  Surgeon: Dessa Reyes ORN, MD;  Location: [facility];  Service: General;  Laterality: Right;   ovaraian cyst removal Right    Cyst only (NOT OVARY)   PORTACATH PLACEMENT Left 09/20/2018   Procedure: INSERTION PORT-A-CATH, LEFT;  Surgeon: Dessa Reyes ORN, MD;  Location: [facility];  Service: General;  Laterality: Left;   POSTERIOR CERVICAL FUSION/FORAMINOTOMY N/A 11/16/2021   Procedure: C1-2 POSTERIOR FUSION WITH ORIF (OPEN REDUCTION INTERNAL FIXATION) C2 FRACTURE (GLOBUS KORE FIBER);  Surgeon: Clois Fret, MD;  Location: [facility];  Service: Neurosurgery;  Laterality: N/A;   SPINE SURGERY  November 16, 2021   TOTAL KNEE ARTHROPLASTY Bilateral 05/05/2010   Social History   Social History Narrative   Retired    Lives by herself    Pets: None   Caffeine- Coffee 2 cups daily, no tea/soda      Immunization History  Administered Date(s) Administered    sv, Bivalent, Protein Subunit Rsvpref,pf (Abrysvo) 02/02/2023   Fluad Quad(high Dose 65+) 01/06/2019, 01/04/2022   Influenza Split 12/28/2010   Influenza, High Dose Seasonal PF 01/22/2018, 02/02/2023   Influenza, Seasonal, Injecte, Preservative Fre 01/23/2012   Influenza,inj,Quad PF,6+ Mos 02/06/2013, 02/03/2014   Influenza,inj,quad, With Preservative 01/22/2017   Influenza-Unspecified 12/15/2014, 01/12/2016, 12/30/2016, 02/19/2020, 02/07/2021   PFIZER(Purple Top)SARS-COV-2 Vaccination 05/22/2019, 06/13/2019, 04/13/2020   Pfizer Covid-19 Vaccine Bivalent Booster 45yrs & up 02/18/2021, 02/18/2022   Pfizer(Comirnaty)Fall Seasonal  Vaccine 12 years and older 02/13/2022, 02/09/2023   Pneumococcal Conjugate-13 02/11/2016, 03/01/2017   Pneumococcal Polysaccharide-23 01/04/2010, 11/08/2012   Pneumococcal-Unspecified 01/22/2017   Tdap 12/30/2016, 01/22/2017   Zoster Recombinant(Shingrix) 03/01/2017, 02/13/2022   Zoster, Live 09/22/2008     Objective: Vital Signs: There were no vitals taken for this visit.   Physical Exam   Musculoskeletal Exam: ***  CDAI Exam: CDAI Score: -- Patient Global: --; Provider Global: -- Swollen: --; Tender: -- Joint Exam 11/19/2023   No joint exam has been documented for this visit   There is currently no information documented on the homunculus. Go to the Rheumatology activity and complete the homunculus joint exam.  Investigation: No additional findings.  Imaging: No results found.  Recent Labs: Lab Results  Component Value Date   WBC 7.1 10/19/2023   HGB 13.6 10/19/2023   PLT 219.0 10/19/2023   NA 141 10/19/2023   K 4.0 10/19/2023   CL 104 10/19/2023   CO2 30 10/19/2023   GLUCOSE 102  (H) 10/19/2023   BUN 11 10/19/2023   CREATININE 0.50 10/19/2023   BILITOT 0.5 10/19/2023   ALKPHOS 101 10/19/2023   AST 17 10/19/2023   ALT 11 10/19/2023   PROT 7.0 10/19/2023   ALBUMIN 4.0 10/19/2023   CALCIUM  9.3 10/19/2023   GFRAA >60 12/08/2019    Speciality Comments: No specialty comments available.  Procedures:  No procedures performed Allergies: Patient has no known allergies.   Assessment / Plan:     Visit Diagnoses: No diagnosis found.  ***  Orders: No orders of the defined types were placed in this encounter.  No orders of the defined types were placed in this encounter.    Follow-Up Instructions: No follow-ups on file.   Shelba SHAUNNA Potters, RT  Note - This record has been created using [employer].  Chart creation errors have been sought, but may not always  have been located. Such creation errors do not reflect on  the standard of medical care.

## 2023-11-16 ENCOUNTER — Encounter

## 2023-11-19 ENCOUNTER — Ambulatory Visit: Admitting: Internal Medicine

## 2023-11-19 DIAGNOSIS — M150 Primary generalized (osteo)arthritis: Secondary | ICD-10-CM

## 2023-11-19 DIAGNOSIS — Z8739 Personal history of other diseases of the musculoskeletal system and connective tissue: Secondary | ICD-10-CM

## 2023-11-20 ENCOUNTER — Ambulatory Visit

## 2023-11-25 ENCOUNTER — Ambulatory Visit: Payer: Self-pay

## 2023-11-26 DIAGNOSIS — M542 Cervicalgia: Secondary | ICD-10-CM | POA: Diagnosis not present

## 2023-11-27 ENCOUNTER — Ambulatory Visit: Admitting: Physical Therapy

## 2023-11-28 ENCOUNTER — Ambulatory Visit: Payer: Self-pay

## 2023-12-04 ENCOUNTER — Encounter: Payer: Self-pay | Admitting: Oncology

## 2023-12-04 NOTE — Telephone Encounter (Signed)
 Received refill on mastectomy bra (s/p mastectomy due to malignant neoplasm for breast cancer), breast prosthesis with adhesive on right from [facility]. Form filled and faxed back. Patient updated via mychart.   Luke Shade, MD

## 2023-12-07 ENCOUNTER — Ambulatory Visit
Admission: RE | Admit: 2023-12-07 | Discharge: 2023-12-07 | Disposition: A | Discharge status: Home / Self Care | Source: Ambulatory Visit | Attending: Physician Assistant | Admitting: Physician Assistant

## 2023-12-07 VITALS — BP 138/80 | HR 91 | Temp 98.5°F | Resp 16 | Ht 63.0 in | Wt 194.9 lb

## 2023-12-07 DIAGNOSIS — R300 Dysuria: Secondary | ICD-10-CM | POA: Diagnosis not present

## 2023-12-07 LAB — URINALYSIS, W/ REFLEX TO CULTURE (INFECTION SUSPECTED)
Bacteria, UA: NONE SEEN
Bilirubin Urine: NEGATIVE
Glucose, UA: NEGATIVE mg/dL
Hgb urine dipstick: NEGATIVE
Ketones, ur: NEGATIVE mg/dL
Leukocytes,Ua: NEGATIVE
Nitrite: NEGATIVE
Protein, ur: NEGATIVE mg/dL
RBC / HPF: NONE SEEN RBC/hpf (ref 0–5)
Specific Gravity, Urine: <1.005 — ABNORMAL LOW (ref 1.005–1.030)
Squamous Epithelial / HPF: NONE SEEN /HPF (ref 0–5)
WBC, UA: NONE SEEN WBC/hpf (ref 0–5)
pH: 5.5 (ref 5.0–8.0)

## 2023-12-07 NOTE — ED Triage Notes (Signed)
 Pt c/o dysuria, urinary frequency, urgency. Started about 6 days ago. Denies lower back pain or fever.

## 2023-12-07 NOTE — ED Provider Notes (Signed)
 [facility]    CSN: [id number] Arrival date & time: 12/07/23  1417      History   Chief Complaint Chief Complaint  Patient presents with   Appointment   Dysuria    HPI Theresa Reid is a 83 y.o. female presenting for dysuria, urinary urgency/frequency x 6 days. No fever, fatigue, flank pain, hematuria, vaginal discharge or odor. Patient is confident that she has a UTI. She says her last UTI was about 6-7 months ago. No other concerns.  HPI  Past Medical History:  Diagnosis Date   Arthritis    Breast cancer (HCC)    Needs mastectomy bra, mastectomy form and breast prothesis with adhesive, last signed and sent to [facility] on 12/04/2023   Cataract 2000   Chronic cough 12/15/2022   Diverticulitis    Dysuria 02/19/2017   Family history of breast cancer    GERD (gastroesophageal reflux disease)    Hyperlipidemia    Hypertension    Lymphedema    right arm   Personal history of chemotherapy    Personal history of radiation therapy    Pre-diabetes     Patient Active Problem List   Diagnosis Date Noted   Claustrophobia 07/23/2023   Lymphedema 04/11/2023   Diverticulitis 03/19/2023   Disequilibrium 12/15/2022   Fingernail abnormalities 11/01/2022   Neuropathy 11/01/2022   Palpitations 07/18/2022   Trapezius strain 05/03/2022   History of diverticulitis 05/03/2022   Fatigue 01/04/2022   S/P cervical spinal fusion 11/16/2021   Dens fracture (HCC) 11/04/2021   Pain in right shoulder 09/27/2021   Prediabetes 12/16/2020   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   Cataract 09/24/2018   Urge incontinence of urine 09/24/2018   Breast cancer (HCC) 06/07/2018   Family history of breast cancer    Dairy product intolerance 01/14/2018   Visual disturbance 10/03/2016   GERD (gastroesophageal reflux disease) 09/14/2016   HLD (hyperlipidemia) 08/13/2015   Seborrheic dermatitis 07/01/2015   Hypertension 11/13/2014   Osteoarthritis of multiple joints  11/13/2014    Past Surgical History:  Procedure Laterality Date   APPLICATION OF INTRAOPERATIVE CT SCAN N/A 11/16/2021   Procedure: APPLICATION OF INTRAOPERATIVE CT SCAN;  Surgeon: Clois Fret, MD;  Location: [facility];  Service: Neurosurgery;  Laterality: N/A;   BREAST BIOPSY Right 05/28/2018   us  bx, INVASIVE MAMMARY CARCINOMA WITH LOBULAR FEATURES   BREAST LUMPECTOMY Right 06/12/2018   [facility], lobular features   BREAST LUMPECTOMY WITH SENTINEL LYMPH NODE BIOPSY Right 06/12/2018   Procedure: RIGHT BREAST WIDE EXCISION WITH SENTINEL LYMPH NODE BX;  Surgeon: Dessa Reyes ORN, MD;  Location: [facility];  Service: General;  Laterality: Right;   CHOLECYSTECTOMY     GALLBLADDER SURGERY  1997   INCISION AND DRAINAGE OF WOUND Right 10/30/2018   Procedure: IRRIGATION AND DEBRIDEMENT RIGHT CHEST WALL WOUND;  Surgeon: Dessa Reyes ORN, MD;  Location: [facility];  Service: General;  Laterality: Right;   JOINT REPLACEMENT  2012 and 2014   both knees   MASTECTOMY Right    MASTECTOMY WITH AXILLARY LYMPH NODE DISSECTION Right 09/20/2018   Procedure: MASTECTOMY WITH AXILLARY LYMPH NODE DISSECTION RIGHT;  Surgeon: Dessa Reyes ORN, MD;  Location: [facility];  Service: General;  Laterality: Right;   ovaraian cyst removal Right    Cyst only (NOT OVARY)   PORTACATH PLACEMENT Left 09/20/2018   Procedure: INSERTION PORT-A-CATH, LEFT;  Surgeon: Dessa Reyes ORN, MD;  Location: [facility];  Service: General;  Laterality: Left;   POSTERIOR CERVICAL FUSION/FORAMINOTOMY  N/A 11/16/2021   Procedure: C1-2 POSTERIOR FUSION WITH ORIF (OPEN REDUCTION INTERNAL FIXATION) C2 FRACTURE (GLOBUS KORE FIBER);  Surgeon: Clois Fret, MD;  Location: [facility];  Service: Neurosurgery;  Laterality: N/A;   SPINE SURGERY  November 16, 2021   TOTAL KNEE ARTHROPLASTY Bilateral 05/05/2010    OB History     Gravida  2   Para  2   Term      Preterm      AB      Living         SAB      IAB      Ectopic       Multiple      Live Births           Obstetric Comments  1st Menstrual Cycle:   10 1st Pregnancy:  20           Home Medications    Prior to Admission medications   Medication Sig Start Date End Date Taking? Authorizing Provider  amLODipine  (NORVASC ) 5 MG tablet Take 1 tablet (5 mg total) by mouth daily. 07/23/23  Yes Bair, Kalpana, MD  anastrozole  (ARIMIDEX ) 1 MG tablet TAKE 1 TABLET BY MOUTH DAILY 04/09/23  Yes Melanee Annah BROCKS, MD  atorvastatin  (LIPITOR) 80 MG tablet Take 1 tablet (80 mg total) by mouth daily. 07/23/23  Yes Bair, Kalpana, MD  CALCIUM -VITAMIN D  PO Take 1 tablet by mouth daily.    Yes [provider]  Multiple Vitamins tablet Take 1 tablet by mouth daily.    Yes [provider]  omeprazole  (PRILOSEC) 20 MG capsule Take 1 capsule (20 mg total) by mouth daily. 07/23/23  Yes Bair, Kalpana, MD  oxybutynin  (DITROPAN ) 5 MG tablet Take 1 tablet (5 mg total) by mouth 2 (two) times daily. 07/23/23  Yes Bair, Kalpana, MD  predniSONE  (DELTASONE ) 5 MG tablet Take 5 mg by mouth as needed.   Yes [provider]  acetaminophen  (TYLENOL ) 325 MG tablet Take 2 tablets (650 mg total) by mouth every 6 (six) hours as needed for mild pain (or Fever >/= 101). 11/08/21   Jhonny Calvin NOVAK, MD  prochlorperazine  (COMPAZINE ) 10 MG tablet Take 1 tablet (10 mg total) by mouth every 6 (six) hours as needed (Nausea or vomiting). 10/10/18 11/18/18  Melanee Annah BROCKS, MD    Family History Family History  Problem Relation Age of Onset   Alcohol abuse Mother        deceased 46   Arthritis Mother    Diabetes Father    Heart disease Father    Breast cancer Maternal Aunt        dx 20s; deceased 44s   Breast cancer Maternal Aunt        dx 68s; deceased 36s   Arthritis Maternal Grandmother     Social History Social History   Tobacco Use   Smoking status: Never    Passive exposure: Never   Smokeless tobacco: Never  Vaping Use   Vaping status: Never Used  Substance Use  Topics   Alcohol use: No    Alcohol/week: 0.0 standard drinks of alcohol   Drug use: No     Allergies   Patient has no known allergies.   Review of Systems Review of Systems  Constitutional:  Negative for chills, fatigue and fever.  Gastrointestinal:  Negative for abdominal pain, diarrhea, nausea and vomiting.  Genitourinary:  Positive for dysuria, frequency and urgency. Negative for decreased urine volume, flank pain, hematuria, pelvic pain,  vaginal bleeding, vaginal discharge and vaginal pain.  Musculoskeletal:  Negative for back pain.  Skin:  Negative for rash.     Physical Exam Triage Vital Signs ED Triage Vitals  Encounter Vitals Group     BP      Girls Systolic BP Percentile      Girls Diastolic BP Percentile      Boys Systolic BP Percentile      Boys Diastolic BP Percentile      Pulse      Resp      Temp      Temp src      SpO2      Weight      Height      Head Circumference      Peak Flow      Pain Score      Pain Loc      Pain Education      Exclude from Growth Chart    No data found.  Updated Vital Signs BP 138/80 (BP Location: Right Arm)   Pulse 91   Temp 98.5 F (36.9 C) (Oral)   Resp 16   Ht 5' 3 (1.6 m)   Wt 194 lb 14.2 oz (88.4 kg)   SpO2 94%   BMI 34.52 kg/m    Physical Exam Vitals and nursing note reviewed.  Constitutional:      General: She is not in acute distress.    Appearance: Normal appearance. She is not ill-appearing or toxic-appearing.  HENT:     Head: Normocephalic and atraumatic.  Eyes:     General: No scleral icterus.       Right eye: No discharge.        Left eye: No discharge.     Conjunctiva/sclera: Conjunctivae normal.  Cardiovascular:     Rate and Rhythm: Normal rate and regular rhythm.     Heart sounds: Normal heart sounds.  Pulmonary:     Effort: Pulmonary effort is normal. No respiratory distress.     Breath sounds: Normal breath sounds.  Abdominal:     Palpations: Abdomen is soft.     Tenderness:  There is no abdominal tenderness. There is no right CVA tenderness or left CVA tenderness.  Musculoskeletal:     Cervical back: Neck supple.  Skin:    General: Skin is dry.  Neurological:     General: No focal deficit present.     Mental Status: She is alert. Mental status is at baseline.     Motor: No weakness.     Gait: Gait normal.  Psychiatric:        Mood and Affect: Mood normal.        Behavior: Behavior normal.      UC Treatments / Results  Labs (all labs ordered are listed, but only abnormal results are displayed) Labs Reviewed  URINALYSIS, W/ REFLEX TO CULTURE (INFECTION SUSPECTED) - Abnormal; Notable for the following components:      Result Value   Specific Gravity, Urine <1.005 (*)    All other components within normal limits  URINE CULTURE    EKG   Radiology No results found.  Procedures Procedures (including critical care time)  Medications Ordered in UC Medications - No data to display  Initial Impression / Assessment and Plan / UC Course  I have reviewed the triage vital signs and the nursing notes.  Pertinent labs & imaging results that were available during my care of the patient were reviewed by me and considered  in my medical decision making (see chart for details).   83 y/o female presents for dysuria, urgency/frequency x 6 days. History of UTIs.  VSS and patient overall well appearing. NAD. Abdomen soft and non tender. No CVA tenderness.   UA obtained. Clear. Will order urine culture. Patient says she has been dismissed for before and had providers miss UTIs and does not want that to happen again. Encouraged staying hydrated. Will treat for UTI if culture is positive. Reviewed return precautions. Advised PCP follow up if symptoms continue.   Final Clinical Impressions(s) / UC Diagnoses   Final diagnoses:  Dysuria     Discharge Instructions      -Will treat if urine culture is positive.      ED Prescriptions   None    PDMP  not reviewed this encounter.   Arvis Jolan NOVAK, PA-C 12/07/23 1514

## 2023-12-07 NOTE — Discharge Instructions (Addendum)
-  Will treat if urine culture is positive.

## 2023-12-08 LAB — URINE CULTURE: Culture: <10000 — AB

## 2023-12-10 ENCOUNTER — Ambulatory Visit (HOSPITAL_COMMUNITY): Payer: Self-pay

## 2023-12-11 ENCOUNTER — Ambulatory Visit: Admitting: Neurosurgery

## 2023-12-11 ENCOUNTER — Encounter: Payer: Self-pay | Admitting: Neurosurgery

## 2023-12-11 VITALS — BP 128/80

## 2023-12-11 DIAGNOSIS — M5412 Radiculopathy, cervical region: Secondary | ICD-10-CM

## 2023-12-11 DIAGNOSIS — G8929 Other chronic pain: Secondary | ICD-10-CM

## 2023-12-11 DIAGNOSIS — M25512 Pain in left shoulder: Secondary | ICD-10-CM

## 2023-12-11 DIAGNOSIS — M25511 Pain in right shoulder: Secondary | ICD-10-CM | POA: Diagnosis not present

## 2023-12-11 MED ORDER — DIAZEPAM 2 MG PO TABS: 2.0000 mg | ORAL_TABLET | Freq: Once | ORAL | 0 refills | Status: DC | PRN

## 2023-12-11 MED ORDER — METHOCARBAMOL 500 MG PO TABS: 500.0000 mg | ORAL_TABLET | Freq: Four times a day (QID) | ORAL | 0 refills | Status: DC | PRN

## 2023-12-11 NOTE — Progress Notes (Signed)
 Referring Physician:  Abbey Bruckner, MD [address] [place],  [place] [address]  Primary Physician:  Abbey Bruckner, MD  History of Present Illness: 12/11/2023 Theresa Reid returns to see me.  She is having pain around her shoulders.  She has difficulty with active range of motion.  She also has some pain in her neck that goes into her left shoulder blade and down her arm.   09/27/2023 She is having occasional neck discomfort.  She is taking Tylenol  for that.  She comes in today for imaging evaluation.   02/15/2023 Theresa Reid is here today with a chief complaint of incidentally found lesion in her brain.  She was suffering from problems with balance and had an MRI scan approximately 1 month ago.  She is currently feeling better.  She is being very careful with her walking.  She has no headaches, nausea, or vomiting.  She has no additional neurologic complaints.  She is still doing well from her neck surgery.   Past Surgery: C1-2 PSF with ORIF on 11/16/21   Review of Systems:  A 10 point review of systems is negative, except for the pertinent positives and negatives detailed in the HPI.  Past Medical History: Past Medical History:  Diagnosis Date   Arthritis    Breast cancer (HCC)    Needs mastectomy bra, mastectomy form and breast prothesis with adhesive, last signed and sent to [facility] family on 12/04/2023   Cataract 2000   Chronic cough 12/15/2022   Diverticulitis    Dysuria 02/19/2017   Family history of breast cancer    GERD (gastroesophageal reflux disease)    Hyperlipidemia    Hypertension    Lymphedema    right arm   Personal history of chemotherapy    Personal history of radiation therapy    Pre-diabetes     Past Surgical History: Past Surgical History:  Procedure Laterality Date   APPLICATION OF INTRAOPERATIVE CT SCAN N/A 11/16/2021   Procedure: APPLICATION OF INTRAOPERATIVE CT SCAN;  Surgeon: Clois Fret, MD;  Location: [facility] OR[facility];  Service:  Neurosurgery;  Laterality: N/A;   BREAST BIOPSY Right 05/28/2018   us  bx, INVASIVE MAMMARY CARCINOMA WITH LOBULAR FEATURES   BREAST LUMPECTOMY Right 06/12/2018   [facility], lobular features   BREAST LUMPECTOMY WITH SENTINEL LYMPH NODE BIOPSY Right 06/12/2018   Procedure: RIGHT BREAST WIDE EXCISION WITH SENTINEL LYMPH NODE BX;  Surgeon: Dessa Reyes ORN, MD;  Location: [facility] OR[facility];  Service: General;  Laterality: Right;   CHOLECYSTECTOMY     GALLBLADDER SURGERY  1997   INCISION AND DRAINAGE OF WOUND Right 10/30/2018   Procedure: IRRIGATION AND DEBRIDEMENT RIGHT CHEST WALL WOUND;  Surgeon: Dessa Reyes ORN, MD;  Location: [facility];  Service: General;  Laterality: Right;   JOINT REPLACEMENT  2012 and 2014   both knees   MASTECTOMY Right    MASTECTOMY WITH AXILLARY LYMPH NODE DISSECTION Right 09/20/2018   Procedure: MASTECTOMY WITH AXILLARY LYMPH NODE DISSECTION RIGHT;  Surgeon: Dessa Reyes ORN, MD;  Location: [facility];  Service: General;  Laterality: Right;   ovaraian cyst removal Right    Cyst only (NOT OVARY)   PORTACATH PLACEMENT Left 09/20/2018   Procedure: INSERTION PORT-A-CATH, LEFT;  Surgeon: Dessa Reyes ORN, MD;  Location: [facility];  Service: General;  Laterality: Left;   POSTERIOR CERVICAL FUSION/FORAMINOTOMY N/A 11/16/2021   Procedure: C1-2 POSTERIOR FUSION WITH ORIF (OPEN REDUCTION INTERNAL FIXATION) C2 FRACTURE (GLOBUS KORE FIBER);  Surgeon: Clois Fret, MD;  Location: [facility];  Service: Neurosurgery;  Laterality: N/A;   SPINE SURGERY  November 16, 2021   TOTAL KNEE ARTHROPLASTY Bilateral 05/05/2010    Allergies: Allergies as of 12/11/2023   (No Known Allergies)    Medications:  Current Outpatient Medications:    acetaminophen  (TYLENOL ) 325 MG tablet, Take 2 tablets (650 mg total) by mouth every 6 (six) hours as needed for mild pain (or Fever >/= 101)., Disp: , Rfl:    amLODipine  (NORVASC ) 5 MG tablet, Take 1 tablet (5 mg total) by mouth daily., Disp: 100 tablet, Rfl:  2   anastrozole  (ARIMIDEX ) 1 MG tablet, TAKE 1 TABLET BY MOUTH DAILY, Disp: 100 tablet, Rfl: 2   atorvastatin  (LIPITOR) 80 MG tablet, Take 1 tablet (80 mg total) by mouth daily., Disp: 100 tablet, Rfl: 2   CALCIUM -VITAMIN D  PO, Take 1 tablet by mouth daily. , Disp: , Rfl:    Multiple Vitamins tablet, Take 1 tablet by mouth daily. , Disp: , Rfl:    omeprazole  (PRILOSEC) 20 MG capsule, Take 1 capsule (20 mg total) by mouth daily., Disp: 100 capsule, Rfl: 2   oxybutynin  (DITROPAN ) 5 MG tablet, Take 1 tablet (5 mg total) by mouth 2 (two) times daily., Disp: 200 tablet, Rfl: 2   predniSONE  (DELTASONE ) 5 MG tablet, Take 5 mg by mouth as needed., Disp: , Rfl:   Social History: Social History   Tobacco Use   Smoking status: Never    Passive exposure: Never   Smokeless tobacco: Never  Vaping Use   Vaping status: Never Used  Substance Use Topics   Alcohol use: No    Alcohol/week: 0.0 standard drinks of alcohol   Drug use: No    Family Medical History: Family History  Problem Relation Age of Onset   Alcohol abuse Mother        deceased 46   Arthritis Mother    Diabetes Father    Heart disease Father    Breast cancer Maternal Aunt        dx 32s; deceased 45s   Breast cancer Maternal Aunt        dx 83s; deceased 33s   Arthritis Maternal Grandmother     Physical Examination: Vitals:   12/11/23 1338  BP: 128/80    General: Patient is in no apparent distress. Attention to examination is appropriate.  Respiratory: Patient is breathing without any difficulty.   NEUROLOGICAL:     Awake, alert, oriented to person, place, and time.  Speech is clear and fluent.   Cranial Nerves: intact  Strength: MAEW Decreased ROM in B shoulder +TTP both shoulders  Bilateral upper and lower extremity sensation is intact to light touch.    No evidence of dysmetria noted.  Gait is abnormal - requires walker.     Medical Decision Making  Imaging: MRI Brain 01/12/2023 IMPRESSION: 1. No  acute brain finding. Mild chronic small-vessel ischemic change of the cerebral hemispheric white matter, often seen at this age. 2. 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 No significant mass-effect upon the cerebellum. 3. Previous ORIF for treatment of C1 and C2 fractures. No apparent compromise of the foramen magnum or upper cervical spinal canal.     Electronically Signed   By: Oneil Officer M.D.   On: 01/31/2023 15:35  MRI Brain 08/28/2023 IMPRESSION: 1. 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 8 mm nodular component. No adjacent cerebellar edema.  2. No acute intracranial abnormality. No evidence of metastatic disease.     Electronically Signed   By: VEAR Hurst M.D.   On: 09/21/2023 09:21 I have personally reviewed the images and agree with the above interpretation.  Assessment and Plan: Ms. Reicks is a pleasant 83 y.o. female with chronic bilateral shoulder pain as well as some symptoms that are possibly referable to cervical radiculopathy.  On her last MRI scan, there was minimal compression of her nerve roots.  Given her symptoms and the fact that they have been present for more than 3 months, I have recommended considering MRI scan of the cervical spine.    Those findings will guide our next recommendation.  I spent a total of 10 minutes in this patient's care today. This time was spent reviewing pertinent records including imaging studies, obtaining and confirming history, performing a directed evaluation, formulating and discussing my recommendations, and documenting the visit within the medical record.    Thank you for involving me in the care of this patient.      Keymani Mclean K. Clois MD, [facility] Neurosurgery

## 2023-12-11 NOTE — Progress Notes (Signed)
 Office Visit Note  Patient: Theresa Reid             Date of Birth: Apr 07, 1941           MRN: [id number]             PCP: Abbey Bruckner, MD Referring: Maribeth Camellia MATSU, MD Visit Date: 12/25/2023   Subjective:  Follow-up (Patient would like to have a prescription of prednisone  for as needed purposes, states it helps with flare.)   History of Present Illness:  Discussed the use of AI scribe software for clinical note transcription with the patient, who gave verbal consent to proceed.  History of Present Illness   Theresa Reid is a 83 y.o. female here for follow up for PMR and generalized osteoarthritis on prednisone  5 mg as needed.    She has been experiencing a flare-up of her arthritis symptoms for the past couple of weeks. The pain is widespread, affecting her shoulders and hips, and she reports that it is severe enough to wake her up by 3 AM, making it difficult to lie in bed due to discomfort. These flare-ups usually occur without a specific trigger and typically last about a week before subsiding but this has been a longer duration than usual.  She has not taken prednisone  since March, as her prescription ran out, but she has been managing her symptoms with Tylenol  occasionally. She recalls that a regimen of 5 mg prednisone  tablets, taken twice daily for three days, then tapered, has been effective in the past for years. Her prescription filled last April for 90 tablets was taken over about 1 year duration.  She has not had any recent falls, although she uses a cane most of the time and has multiple mobility aids at home. She has a history of neuropathy in her feet, which affects her balance and awareness of foot placement, making her cautious in her movements.  She experiences stiffness if she remains inactive for too long, but also notes that excessive activity exacerbates her symptoms. She has previously used NSAIDs but discontinued them due to gastrointestinal  discomfort, which resolved after cessation. She has tried topical treatments like Tylenol  Precise on her shoulders, which provides some relief, but has not used it on her hips.      Previous HPI 08/16/2022 Theresa Reid is a 83 y.o. female here for follow up for PMR and generalized osteoarthritis on prednisone  5 mg PRN. She has been taking the prednisone  intermittently, when experiencing a flare up takes a tapering dose for less than 2 weeks total duration usually with quick resolution of symptoms. She has daily joint stiffness about 30 minutes. Knee pain bothers her worse on days she spends longer on her feet. She had a recent fall on her stairs at home and hit her back. There was no significant injury found. She has not fallen for greater than 30 years prior to this. She had recent bone density test in February with t-score -1.0.     Previous HPI 09/27/21 Theresa Reid is a 83 y.o. female here for follow up for PMR and generalized osteoarthritis. Since our visit last year she has infrequent exacerbations of symptoms less than monthly which quickly improve on prednisone  5 mg. Each such incident is usually less than 1 week. Besides this symptoms are overall stable. She also reports some right shoulder pain when lying in bed to sleep on that side. She has some increased stiffness and difficulty reaching far above  and behind.   Previous HPI 10/01/2020 Theresa Reid is a 83 y.o. female here for evaluation for PMR previously diagnosed 2016 and has been taking prednisone  5 mg PO daily dose and seeing [facility] rheumatology Linda Belhorn for this now transitioning care due to leaving the practice.  Currently she experiences a chronic mild to moderate level of joint pain in multiple sites with osteoarthritis.  She experiences flareup of joint pain symptoms which she treats with prednisone  5-10 milligrams daily as needed which she estimates is maybe only a few days out of each month.  Her most problematic  area is in the right shoulder.  She does have history of right breast cancer treatment of which has some residual right arm lymphedema.  Currently today she is feeling well.  Review of Systems  Constitutional:  Positive for fatigue.  HENT:  Negative for mouth sores and mouth dryness.   Eyes:  Positive for dryness.  Respiratory:  Negative for shortness of breath.   Cardiovascular:  Negative for chest pain and palpitations.  Gastrointestinal:  Negative for blood in stool, constipation and diarrhea.  Endocrine: Negative for increased urination.  Genitourinary:  Negative for involuntary urination.  Musculoskeletal:  Positive for joint pain, gait problem, joint pain, joint swelling, myalgias, morning stiffness and myalgias. Negative for muscle weakness and muscle tenderness.  Skin:  Negative for color change, rash, hair loss and sensitivity to sunlight.  Allergic/Immunologic: Negative for susceptible to infections.  Neurological:  Negative for dizziness and headaches.  Hematological:  Negative for swollen glands.  Psychiatric/Behavioral:  Positive for sleep disturbance. Negative for depressed mood. The patient is not nervous/anxious.     PMFS History:  Patient Active Problem List   Diagnosis Date Noted   Claustrophobia 07/23/2023   Lymphedema 04/11/2023   Diverticulitis 03/19/2023   Disequilibrium 12/15/2022   Fingernail abnormalities 11/01/2022   Neuropathy 11/01/2022   Palpitations 07/18/2022   Trapezius strain 05/03/2022   History of diverticulitis 05/03/2022   Fatigue 01/04/2022   S/P cervical spinal fusion 11/16/2021   Dens fracture (HCC) 11/04/2021   Pain in right shoulder 09/27/2021   Prediabetes 12/16/2020   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   Cataract 09/24/2018   Urge incontinence of urine 09/24/2018   Breast cancer (HCC) 06/07/2018   Family history of breast cancer    Dairy product intolerance 01/14/2018   Visual disturbance 10/03/2016   GERD  (gastroesophageal reflux disease) 09/14/2016   HLD (hyperlipidemia) 08/13/2015   Seborrheic dermatitis 07/01/2015   Hypertension 11/13/2014   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    Past Medical History:  Diagnosis Date   Arthritis diagnosed in early 38s   Breast cancer (HCC) January 2020   Needs mastectomy bra, mastectomy form and breast prothesis with adhesive, last signed and sent to [facility] family on 12/04/2023   Cataract 2000   Chronic cough 12/15/2022   Diverticulitis    Dysuria 02/19/2017   Family history of breast cancer    GERD (gastroesophageal reflux disease)    Hyperlipidemia    Hypertension    Lymphedema    right arm   Personal history of chemotherapy    Personal history of radiation therapy    Pre-diabetes     Family History  Problem Relation Age of Onset   Alcohol abuse Mother        deceased 95   Arthritis Mother    Diabetes Father    Heart disease Father    Breast cancer Maternal Aunt  dx 21s; deceased 73s   Breast cancer Maternal Aunt        dx 50s; deceased 30s   Arthritis Maternal Grandmother    Past Surgical History:  Procedure Laterality Date   APPLICATION OF INTRAOPERATIVE CT SCAN N/A 11/16/2021   Procedure: APPLICATION OF INTRAOPERATIVE CT SCAN;  Surgeon: Clois Fret, MD;  Location: [facility] OR[facility];  Service: Neurosurgery;  Laterality: N/A;   BREAST BIOPSY Right 05/28/2018   us  bx, INVASIVE MAMMARY CARCINOMA WITH LOBULAR FEATURES   BREAST LUMPECTOMY Right 06/12/2018   [facility], lobular features   BREAST LUMPECTOMY WITH SENTINEL LYMPH NODE BIOPSY Right 06/12/2018   Procedure: RIGHT BREAST WIDE EXCISION WITH SENTINEL LYMPH NODE BX;  Surgeon: Dessa Reyes ORN, MD;  Location: [facility] OR[facility];  Service: General;  Laterality: Right;   CHOLECYSTECTOMY  1997   GALLBLADDER SURGERY  1997   INCISION AND DRAINAGE OF WOUND Right 10/30/2018   Procedure: IRRIGATION AND DEBRIDEMENT RIGHT CHEST WALL WOUND;  Surgeon: Dessa Reyes ORN, MD;  Location: [facility]  ORS;  Service: General;  Laterality: Right;   JOINT REPLACEMENT  2012 and 2014   both knees   MASTECTOMY Right    MASTECTOMY WITH AXILLARY LYMPH NODE DISSECTION Right 09/20/2018   Procedure: MASTECTOMY WITH AXILLARY LYMPH NODE DISSECTION RIGHT;  Surgeon: Dessa Reyes ORN, MD;  Location: [facility] OR[facility];  Service: General;  Laterality: Right;   ovaraian cyst removal Right    Cyst only (NOT OVARY)   PORTACATH PLACEMENT Left 09/20/2018   Procedure: INSERTION PORT-A-CATH, LEFT;  Surgeon: Dessa Reyes ORN, MD;  Location: [facility] OR[facility];  Service: General;  Laterality: Left;   POSTERIOR CERVICAL FUSION/FORAMINOTOMY N/A 11/16/2021   Procedure: C1-2 POSTERIOR FUSION WITH ORIF (OPEN REDUCTION INTERNAL FIXATION) C2 FRACTURE (GLOBUS KORE FIBER);  Surgeon: Clois Fret, MD;  Location: [facility] OR[facility];  Service: Neurosurgery;  Laterality: N/A;   SPINE SURGERY  November 16, 2021   TOTAL KNEE ARTHROPLASTY Bilateral 05/05/2010   Social History   Social History Narrative   Retired    Lives by herself    Pets: None   Caffeine- Coffee 2 cups daily, no tea/soda      Immunization History  Administered Date(s) Administered    sv, Bivalent, Protein Subunit Rsvpref,pf (Abrysvo) 02/02/2023   Fluad Quad(high Dose 65+) 01/06/2019, 01/04/2022   INFLUENZA, HIGH DOSE SEASONAL PF 01/22/2018, 02/02/2023   Influenza Split 12/28/2010   Influenza, Seasonal, Injecte, Preservative Fre 01/23/2012   Influenza,inj,Quad PF,6+ Mos 02/06/2013, 02/03/2014   Influenza,inj,quad, With Preservative 01/22/2017   Influenza-Unspecified 12/15/2014, 01/12/2016, 12/30/2016, 02/19/2020, 02/07/2021   PFIZER(Purple Top)SARS-COV-2 Vaccination 05/22/2019, 06/13/2019, 04/13/2020   Pfizer Covid-19 Vaccine Bivalent Booster 76yrs & up 02/18/2021, 02/18/2022   Pfizer(Comirnaty)Fall Seasonal Vaccine 12 years and older 02/13/2022, 02/09/2023   Pneumococcal Conjugate-13 02/11/2016, 03/01/2017   Pneumococcal Polysaccharide-23 01/04/2010, 11/08/2012    Pneumococcal-Unspecified 01/22/2017   Tdap 12/30/2016, 01/22/2017   Zoster Recombinant(Shingrix) 03/01/2017, 02/13/2022   Zoster, Live 09/22/2008     Objective: Vital Signs: BP (!) 159/73 (BP Location: Left Arm, Patient Position: Sitting, Cuff Size: Normal)   Pulse 78   Resp 16   Ht 5' 3 (1.6 m)   Wt 191 lb (86.6 kg)   BMI 33.83 kg/m    Physical Exam Constitutional:      Appearance: She is obese.  Eyes:     Conjunctiva/sclera: Conjunctivae normal.  Cardiovascular:     Rate and Rhythm: Normal rate and regular rhythm.  Pulmonary:     Effort: Pulmonary effort is normal.     Breath sounds: Normal  breath sounds.  Musculoskeletal:     Right lower leg: No edema.     Left lower leg: No edema.  Skin:    General: Skin is warm and dry.  Neurological:     Mental Status: She is alert.  Psychiatric:        Mood and Affect: Mood normal.      Musculoskeletal Exam:  Neck restricted lateral rotation and flexion and extension Shoulders limited in abduction and external rotation b/l, mild tenderness to pressure no swelling Elbows full ROM no tenderness or swelling Wrists full ROM no tenderness or swelling Fingers full ROM no tenderness or swelling, some crepitus Lateral tenderness to pressure over both hips, no pain radiation, no pain with passive internal and external rotation Knees tenderness to pressure on left knee along superior side of joint, no palpable effusions   Investigation: No additional findings.  Imaging: No results found.  Recent Labs: Lab Results  Component Value Date   WBC 7.1 10/19/2023   HGB 13.6 10/19/2023   PLT 219.0 10/19/2023   NA 141 10/19/2023   K 4.0 10/19/2023   CL 104 10/19/2023   CO2 30 10/19/2023   GLUCOSE 102 (H) 10/19/2023   BUN 11 10/19/2023   CREATININE 0.50 10/19/2023   BILITOT 0.5 10/19/2023   ALKPHOS 101 10/19/2023   AST 17 10/19/2023   ALT 11 10/19/2023   PROT 7.0 10/19/2023   ALBUMIN 4.0 10/19/2023   CALCIUM  9.3 10/19/2023    GFRAA >60 12/08/2019    Speciality Comments: No specialty comments available.  Procedures:  No procedures performed Allergies: Patient has no known allergies.   Assessment / Plan:     Visit Diagnoses: Primary osteoarthritis involving multiple joints - Plan: predniSONE  (DELTASONE ) 5 MG tablet History of polymyalgia rheumatica - Plan: predniSONE  (DELTASONE ) 5 MG tablet Flare-up causing significant pain in shoulders and hips. Previous low-dose prednisone  effective without side effects. Prednisone  preferred over NSAIDs due to past gastrointestinal issues. Recent labs reviewed from PCP office in June normal CBC and CMP and bone density scan okay last year. - Prescribed low-dose prednisone , 5 mg tablets, morning and night for three days, then taper over three to four days. - Monitor response to prednisone  and adjust treatment if necessary.  Generalized osteoarthritis Long-standing condition with pain in multiple joints. NSAID use discontinued due to gastrointestinal side effects. Current use of Tylenol  and topical treatments for relief. Limited efficacy expected from topical treatments for deep joint pain. - Continue Tylenol  for pain management as needed. - Consider trial of Cymbalta or Lyrica for neuropathic pain if needed      Orders: No orders of the defined types were placed in this encounter.  Meds ordered this encounter  Medications   predniSONE  (DELTASONE ) 5 MG tablet    Sig: Take 1 tablet (5 mg total) by mouth as needed.    Dispense:  90 tablet    Refill:  0     Follow-Up Instructions: Return in about 1 year (around 12/24/2024) for OA/PMr on GC f/u 32yr or PRN.   Lonni LELON Ester, MD  Note - This record has been created using [employer].  Chart creation errors have been sought, but may not always  have been located. Such creation errors do not reflect on  the standard of medical care.

## 2023-12-15 ENCOUNTER — Ambulatory Visit
Admission: RE | Admit: 2023-12-15 | Discharge: 2023-12-15 | Disposition: A | Discharge status: Home / Self Care | Source: Ambulatory Visit | Attending: Neurosurgery | Admitting: Neurosurgery

## 2023-12-15 DIAGNOSIS — M50222 Other cervical disc displacement at C5-C6 level: Secondary | ICD-10-CM | POA: Diagnosis not present

## 2023-12-15 DIAGNOSIS — M4802 Spinal stenosis, cervical region: Secondary | ICD-10-CM | POA: Diagnosis not present

## 2023-12-15 DIAGNOSIS — M5412 Radiculopathy, cervical region: Secondary | ICD-10-CM | POA: Diagnosis not present

## 2023-12-15 DIAGNOSIS — G8929 Other chronic pain: Secondary | ICD-10-CM

## 2023-12-24 ENCOUNTER — Other Ambulatory Visit: Payer: Self-pay | Admitting: Oncology

## 2023-12-25 ENCOUNTER — Encounter: Payer: Self-pay | Admitting: Oncology

## 2023-12-25 ENCOUNTER — Ambulatory Visit: Attending: Internal Medicine | Admitting: Internal Medicine

## 2023-12-25 ENCOUNTER — Encounter: Payer: Self-pay | Admitting: Internal Medicine

## 2023-12-25 VITALS — BP 159/73 | HR 78 | Resp 16 | Ht 63.0 in | Wt 191.0 lb

## 2023-12-25 DIAGNOSIS — M150 Primary generalized (osteo)arthritis: Secondary | ICD-10-CM

## 2023-12-25 DIAGNOSIS — Z8739 Personal history of other diseases of the musculoskeletal system and connective tissue: Secondary | ICD-10-CM | POA: Diagnosis not present

## 2023-12-25 MED ORDER — PREDNISONE 5 MG PO TABS: 5.0000 mg | ORAL_TABLET | ORAL | 0 refills | Status: DC | PRN

## 2023-12-26 ENCOUNTER — Encounter: Payer: Self-pay | Admitting: Oncology

## 2023-12-26 ENCOUNTER — Telehealth: Payer: Self-pay

## 2023-12-26 ENCOUNTER — Inpatient Hospital Stay: Attending: Oncology | Admitting: Oncology

## 2023-12-26 VITALS — BP 126/70 | HR 76 | Temp 97.5°F | Resp 18 | Ht 63.0 in | Wt 191.0 lb

## 2023-12-26 DIAGNOSIS — Z17411 Hormone receptor positive with human epidermal growth factor receptor 2 negative status: Secondary | ICD-10-CM | POA: Insufficient documentation

## 2023-12-26 DIAGNOSIS — Z79811 Long term (current) use of aromatase inhibitors: Secondary | ICD-10-CM | POA: Diagnosis not present

## 2023-12-26 DIAGNOSIS — Z5181 Encounter for therapeutic drug level monitoring: Secondary | ICD-10-CM | POA: Diagnosis not present

## 2023-12-26 DIAGNOSIS — C773 Secondary and unspecified malignant neoplasm of axilla and upper limb lymph nodes: Secondary | ICD-10-CM | POA: Insufficient documentation

## 2023-12-26 DIAGNOSIS — Z79899 Other long term (current) drug therapy: Secondary | ICD-10-CM

## 2023-12-26 DIAGNOSIS — Z853 Personal history of malignant neoplasm of breast: Secondary | ICD-10-CM | POA: Diagnosis not present

## 2023-12-26 DIAGNOSIS — C50911 Malignant neoplasm of unspecified site of right female breast: Secondary | ICD-10-CM | POA: Insufficient documentation

## 2023-12-26 DIAGNOSIS — Z08 Encounter for follow-up examination after completed treatment for malignant neoplasm: Secondary | ICD-10-CM

## 2023-12-26 MED ORDER — PREDNISONE 5 MG PO TABS: 5.0000 mg | ORAL_TABLET | Freq: Every day | ORAL | 0 refills | Status: AC | PRN

## 2023-12-26 NOTE — Telephone Encounter (Signed)
 Patient contacted the office and states the pharmacy advised her that her prednisone  needs better directions. Please advise.

## 2023-12-26 NOTE — Telephone Encounter (Signed)
 Patient advised Dr. Jeannetta sent a new prescription for daily as needed, which is not entirely accurate but should be good enough for the pharmacy. Patient verbalized understanding.

## 2023-12-26 NOTE — Telephone Encounter (Signed)
 I sent a new prescription for daily as needed, which is not entirely accurate but should be good enough for the pharmacy.

## 2023-12-26 NOTE — Addendum Note (Signed)
 Addended by: JEANNETTA LONNI ORN on: 12/26/2023 11:18 AM   Modules accepted: Orders

## 2023-12-29 ENCOUNTER — Encounter: Payer: Self-pay | Admitting: Neurosurgery

## 2023-12-29 DIAGNOSIS — M5412 Radiculopathy, cervical region: Secondary | ICD-10-CM

## 2023-12-30 ENCOUNTER — Encounter: Payer: Self-pay | Admitting: Oncology

## 2023-12-30 NOTE — Progress Notes (Signed)
 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  Patient underwent lumpectomy and sentinel lymph node biopsy in February 2020.  Pathology showed invasive lobular carcinoma with positive medial margin which was reexcised with still positive.  Tumor was 24 mm, grade 2 ER PR positive and HER-2/neu negative.  One sentinel lymph node that was excised was positive for macro met of 8 mm.  No extranodal extension was present.  MammaPrint came back as high risk with an average 10-year risk of untreated disease is 29%.  Patient then underwent a bilateral MRI which showed additional suspicious non-mass enhancement along the inferior margin of the biopsy cavity extending 2 cm highly suspicious for continuous disease and another highly suspicious linear non-mass enhancement at the base of the nipple concerning for multifocal multicentric disease.  Patient underwent right mastectomy  and axillary lymph node dissection on 09/20/2018.  There was no residual invasive mammary carcinoma within the breast.  However 15 out of 20 lymph nodes were involved with metastatic carcinoma measuring up to 6 mm and remaining 5 lymph nodes with isolated tumor cells.  Pathologic stage was [id number]   Despite having surgery in May 2020 which was a second surgery-patient has not been able to start adjuvant chemotherapy.  Her mastectomy was complicated by flap necrosis requiring debridement antibiotics and she had to be taken back to the OR for the same. Given her age and postmastectomy complications and delayed wound healing-weekly Taxol  x12 cycles was chosen as the regimen for her adjuvant chemotherapy which she completed on 02/25/2019.   Patient started Arimidex  in December 2020 and also completed adjuvant Zometa .  She completed adjuvant radiation treatment  Interval history-denies any breast concerns at this time.  She is compliant with the use of compressions sleeves after her mastectomy to prevent lymphedema.  Tolerating Arimidex  well without any significant side effects  ECOG PS- 2 Pain scale- 0   Review of systems- Review of Systems  Constitutional:  Negative for chills, fever, malaise/fatigue and weight loss.  HENT:  Negative for congestion, ear discharge and nosebleeds.   Eyes:  Negative for blurred vision.  Respiratory:  Negative for cough, hemoptysis, sputum production, shortness of breath and wheezing.   Cardiovascular:  Negative for chest pain, palpitations, orthopnea and claudication.  Gastrointestinal:  Negative for abdominal pain, blood in stool, constipation, diarrhea, heartburn, melena, nausea and vomiting.  Genitourinary:  Negative for dysuria, flank pain, frequency, hematuria and urgency.  Musculoskeletal:  Negative for back pain, joint pain and myalgias.  Skin:  Negative for rash.  Neurological:  Negative  for dizziness, tingling, focal weakness, seizures, weakness and  headaches.  Endo/Heme/Allergies:  Does not bruise/bleed easily.  Psychiatric/Behavioral:  Negative for depression and suicidal ideas. The patient does not have insomnia.       No Known Allergies   Past Medical History:  Diagnosis Date   Arthritis diagnosed in early 2s   Breast cancer (HCC) January 2020   Needs mastectomy bra, mastectomy form and breast prothesis with adhesive, last signed and [facility] to [facility] family on 12/04/2023   Cataract 2000   Chronic cough 12/15/2022   Diverticulitis    Dysuria 02/19/2017   Family history of breast cancer    GERD (gastroesophageal reflux disease)    Hyperlipidemia    Hypertension    Lymphedema    right arm   Personal history of chemotherapy    Personal history of radiation therapy    Pre-diabetes      Past Surgical History:  Procedure Laterality Date   APPLICATION OF INTRAOPERATIVE CT SCAN N/A 11/16/2021   Procedure: APPLICATION OF INTRAOPERATIVE CT SCAN;  Surgeon: Clois Fret, MD;  Location: [facility] ORS;  Service: Neurosurgery;  Laterality: N/A;   BREAST BIOPSY Right 05/28/2018   us  bx, INVASIVE MAMMARY CARCINOMA WITH LOBULAR FEATURES   BREAST LUMPECTOMY Right 06/12/2018   [facility], lobular features   BREAST LUMPECTOMY WITH SENTINEL LYMPH NODE BIOPSY Right 06/12/2018   Procedure: RIGHT BREAST WIDE EXCISION WITH SENTINEL LYMPH NODE BX;  Surgeon: Dessa Reyes ORN, MD;  Location: [facility] ORS;  Service: General;  Laterality: Right;   CHOLECYSTECTOMY  1997   GALLBLADDER SURGERY  1997   INCISION AND DRAINAGE OF WOUND Right 10/30/2018   Procedure: IRRIGATION AND DEBRIDEMENT RIGHT CHEST WALL WOUND;  Surgeon: Dessa Reyes ORN, MD;  Location: [facility] ORS;  Service: General;  Laterality: Right;   JOINT REPLACEMENT  2012 and 2014   both knees   MASTECTOMY Right    MASTECTOMY WITH AXILLARY LYMPH NODE DISSECTION Right 09/20/2018   Procedure: MASTECTOMY WITH AXILLARY LYMPH NODE DISSECTION RIGHT;  Surgeon: Dessa Reyes ORN, MD;  Location: [facility]  ORS;  Service: General;  Laterality: Right;   ovaraian cyst removal Right    Cyst only (NOT OVARY)   PORTACATH PLACEMENT Left 09/20/2018   Procedure: INSERTION PORT-A-CATH, LEFT;  Surgeon: Dessa Reyes ORN, MD;  Location: [facility] ORS;  Service: General;  Laterality: Left;   POSTERIOR CERVICAL FUSION/FORAMINOTOMY N/A 11/16/2021   Procedure: C1-2 POSTERIOR FUSION WITH ORIF (OPEN REDUCTION INTERNAL FIXATION) C2 FRACTURE (GLOBUS KORE FIBER);  Surgeon: Clois Fret, MD;  Location: [facility] ORS;  Service: Neurosurgery;  Laterality: N/A;   SPINE SURGERY  November 16, 2021   TOTAL KNEE ARTHROPLASTY Bilateral 05/05/2010    Social History   Socioeconomic History   Marital status: Single    Spouse name: Not on file   Number of children: Not on file   Years of education: Not on file   Highest education level: Some college, no degree  Occupational History   Occupation: retired  Tobacco Use   Smoking status: Never    Passive exposure: Never   Smokeless tobacco: Never  Vaping Use   Vaping status: Never Used  Substance and Sexual Activity   Alcohol use: No    Alcohol/week: 0.0 standard drinks of alcohol   Drug use: No   Sexual activity: Never  Other Topics Concern   Not on file  Social History Narrative   Retired    Lives by herself    Pets: None   Caffeine- Coffee 2 cups daily,  no tea/soda      Social Drivers of Corporate investment banker Strain: Low Risk  (10/22/2023)   Overall Financial Resource Strain (CARDIA)    Difficulty of Paying Living Expenses: Not hard at all  Food Insecurity: No Food Insecurity (10/22/2023)   Hunger Vital Sign    Worried About Running Out of Food in the Last Year: Never true    Ran Out of Food in the Last Year: Never true  Transportation Needs: No Transportation Needs (10/22/2023)   PRAPARE - Administrator, Civil Service (Medical): No    Lack of Transportation (Non-Medical): No  Physical Activity: Insufficiently Active (10/22/2023)   Exercise  Vital Sign    Days of Exercise per Week: 3 days    Minutes of Exercise per Session: 30 min  Stress: No Stress Concern Present (10/22/2023)   [employer] of Occupational Health - Occupational Stress Questionnaire    Feeling of Stress: Not at all  Social Connections: Socially Isolated (10/22/2023)   Social Connection and Isolation Panel    Frequency of Communication with Friends and Family: More than three times a week    Frequency of Social Gatherings with Friends and Family: More than three times a week    Attends Religious Services: Never    Database administrator or Organizations: No    Attends Engineer, structural: Not on file    Marital Status: Divorced  Intimate Partner Violence: Not At Risk (06/11/2023)   Humiliation, Afraid, Rape, and Kick questionnaire    Fear of Current or Ex-Partner: No    Emotionally Abused: No    Physically Abused: No    Sexually Abused: No    Family History  Problem Relation Age of Onset   Alcohol abuse Mother        deceased 59   Arthritis Mother    Diabetes Father    Heart disease Father    Breast cancer Maternal Aunt        dx 36s; deceased 30s   Breast cancer Maternal Aunt        dx 78s; deceased 63s   Arthritis Maternal Grandmother      Current Outpatient Medications:    acetaminophen  (TYLENOL ) 325 MG tablet, Take 2 tablets (650 mg total) by mouth every 6 (six) hours as needed for mild pain (or Fever >/= 101)., Disp: , Rfl:    amLODipine  (NORVASC ) 5 MG tablet, Take 1 tablet (5 mg total) by mouth daily., Disp: 100 tablet, Rfl: 2   anastrozole  (ARIMIDEX ) 1 MG tablet, TAKE 1 TABLET BY MOUTH DAILY, Disp: 100 tablet, Rfl: 2   atorvastatin  (LIPITOR) 80 MG tablet, Take 1 tablet (80 mg total) by mouth daily., Disp: 100 tablet, Rfl: 2   CALCIUM -VITAMIN D  PO, Take 1 tablet by mouth daily. , Disp: , Rfl:    diazepam  (VALIUM ) 2 MG tablet, Take 1 tablet (2 mg total) by mouth once as needed for up to 1 dose for anxiety (prior to MRI)., Disp:  2 tablet, Rfl: 0   methocarbamol  (ROBAXIN ) 500 MG tablet, Take 1 tablet (500 mg total) by mouth every 6 (six) hours as needed for muscle spasms., Disp: 30 tablet, Rfl: 0   Multiple Vitamins tablet, Take 1 tablet by mouth daily. , Disp: , Rfl:    omeprazole  (PRILOSEC) 20 MG capsule, Take 1 capsule (20 mg total) by mouth daily., Disp: 100 capsule, Rfl: 2   oxybutynin  (DITROPAN ) 5 MG tablet, Take 1 tablet (5 mg total) by  mouth 2 (two) times daily., Disp: 200 tablet, Rfl: 2   predniSONE  (DELTASONE ) 5 MG tablet, Take 1 tablet (5 mg total) by mouth daily as needed., Disp: 90 tablet, Rfl: 0  Physical exam:  Vitals:   12/26/23 1358  BP: 126/70  Pulse: 76  Resp: 18  Temp: (!) 97.5 F (36.4 C)  TempSrc: Tympanic  SpO2: 97%  Weight: 191 lb (86.6 kg)  Height: 5' 3 (1.6 m)   Physical Exam Cardiovascular:     Rate and Rhythm: Normal rate and regular rhythm.     Heart sounds: Normal heart sounds.  Pulmonary:     Effort: Pulmonary effort is normal.     Breath sounds: Normal breath sounds.  Skin:    General: Skin is warm and dry.  Neurological:     Mental Status: She is alert and oriented to person, place, and time.   Breast exam: Patient is status post right mastectomy without reconstruction.  No evidence of chest wall recurrence.  No palpable bilateral axillary adenopathy.  No palpable masses in the left breast  I have personally reviewed labs listed below:    Latest Ref Rng & Units 10/19/2023    9:43 AM  CMP  Glucose 70 - 99 mg/dL 897   BUN 6 - 23 mg/dL 11   Creatinine 9.59 - 1.20 mg/dL 9.49   Sodium 864 - 854 mEq/L 141   Potassium 3.5 - 5.1 mEq/L 4.0   Chloride 96 - 112 mEq/L 104   CO2 19 - 32 mEq/L 30   Calcium  8.4 - 10.5 mg/dL 9.3   Total Protein 6.0 - 8.3 g/dL 7.0   Total Bilirubin 0.2 - 1.2 mg/dL 0.5   Alkaline Phos 39 - 117 U/L 101   AST 0 - 37 U/L 17   ALT 0 - 35 U/L 11       Latest Ref Rng & Units 10/19/2023    9:43 AM  CBC  WBC 4.0 - 10.5 K/uL 7.1   Hemoglobin 12.0  - 15.0 g/dL 86.3   Hematocrit 63.9 - 46.0 % 41.4   Platelets 150.0 - 400.0 K/uL 219.0    I have personally reviewed Radiology images listed below: No images are attached to the encounter.  MR CERVICAL SPINE WO CONTRAST Result Date: 12/27/2023 CLINICAL DATA:  Neck pain, acute, prior cervical surgery. Cervical radiculopathy. Chronic pain of both shoulders. EXAM: MRI CERVICAL SPINE WITHOUT CONTRAST TECHNIQUE: Multiplanar, multisequence MR imaging of the cervical spine was performed. No intravenous contrast was administered. COMPARISON:  Cervical spine MRI 11/05/2021 FINDINGS: Alignment: Trace anterolisthesis of C3 on C4 and C5 on C6. Vertebrae: Now chronic appearing type 2 dens fracture status post interval C1-2 posterior fusion. Preserved vertebral body heights. Moderate left facet edema at C2-3. No suspicious marrow lesion. Cord: Normal signal. Posterior Fossa, vertebral arteries, paraspinal tissues: 9 mm left-sided posterior fossa meningioma, more fully evaluated on the 08/28/2023 brain MRI. Preserved vertebral artery flow voids. No significant edema in the paraspinal soft tissues. Disc levels: C2-3: Mild right and moderate left facet arthrosis without disc herniation or stenosis. C3-4: Mild disc bulging, uncovertebral spurring, and moderate left greater than right facet arthrosis without significant stenosis, unchanged. C4-5: Mild disc bulging, uncovertebral spurring, and mild right and moderate left facet arthrosis result in mild spinal stenosis and mild bilateral neural foraminal stenosis. C5-6: Mild disc bulging, uncovertebral spurring, and moderate facet arthrosis mild right and moderate left neural foraminal stenosis, mildly progressed. No spinal stenosis. C6-7: Disc bulging, uncovertebral spurring, and mild  facet arthrosis result in mild bilateral neural foraminal stenosis without spinal stenosis, unchanged. C7-T1: Mild right facet arthrosis without disc herniation or stenosis, unchanged. IMPRESSION:  1. 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 2. New left facet edema at C2-3 which may be a source of neck pain. 3. Increased, moderate left and mild right neural foraminal stenosis at C5-6. 4. Unchanged mild spinal and neural foraminal stenosis at C4-5. Electronically Signed   By: Dasie Hamburg M.D.   On: 12/27/2023 09:54     Assessment and plan- Patient is a 83 y.o. female  with history of stage III ER/PR positive HER2 negative right breast cancer s/p mastectomy adjuvant chemotherapy and radiation currently on Arimidex .  This is a routine follow-up visit for breast cancer  Clinically patient is doing well with no concerning signs and symptoms of recurrence based on today's exam.  She will be completing 5 years of endocrine therapy in December 2025.  Her baseline bone density scan is normal and given that she had locally advanced disease with multiple positive lymph nodes she is agreeable to taking it for 5 more years.  She would be due for left screening mammogram in April 2026.  I will see her back in 6 months no labs with a bone density scan prior   Visit Diagnosis 1. Visit for monitoring Arimidex  therapy   2. Encounter for follow-up surveillance of breast cancer   3. High risk medication use      Dr. Annah Skene, MD, MPH [facility] [phone] 12/30/2023 11:21 AM

## 2023-12-31 ENCOUNTER — Ambulatory Visit

## 2023-12-31 NOTE — Addendum Note (Signed)
 Addended by: GIRARD DON GAILS on: 12/31/2023 12:58 PM   Modules accepted: Orders

## 2024-01-04 ENCOUNTER — Ambulatory Visit

## 2024-01-16 ENCOUNTER — Ambulatory Visit: Admitting: Physical Therapy

## 2024-01-18 DIAGNOSIS — C50911 Malignant neoplasm of unspecified site of right female breast: Secondary | ICD-10-CM | POA: Diagnosis not present

## 2024-01-18 DIAGNOSIS — Z9011 Acquired absence of right breast and nipple: Secondary | ICD-10-CM | POA: Diagnosis not present

## 2024-01-18 DIAGNOSIS — Z4431 Encounter for fitting and adjustment of external right breast prosthesis: Secondary | ICD-10-CM | POA: Diagnosis not present

## 2024-01-22 ENCOUNTER — Telehealth: Payer: Self-pay

## 2024-01-22 NOTE — Telephone Encounter (Signed)
 Lm and sent MyChart message to call office to reschedule appointment. Front office needs to speak to patient, ask for Theresa Reid or Theresa Reid.

## 2024-01-23 ENCOUNTER — Ambulatory Visit: Admitting: Physical Therapy

## 2024-01-25 ENCOUNTER — Other Ambulatory Visit: Payer: Self-pay | Admitting: Physician Assistant

## 2024-01-28 ENCOUNTER — Encounter: Payer: Self-pay | Admitting: Physical Therapy

## 2024-01-28 ENCOUNTER — Ambulatory Visit: Admitting: Physical Therapy

## 2024-01-28 ENCOUNTER — Other Ambulatory Visit: Payer: Self-pay

## 2024-01-28 DIAGNOSIS — M6281 Muscle weakness (generalized): Secondary | ICD-10-CM | POA: Diagnosis present

## 2024-01-28 DIAGNOSIS — R2681 Unsteadiness on feet: Secondary | ICD-10-CM | POA: Insufficient documentation

## 2024-01-28 DIAGNOSIS — R42 Dizziness and giddiness: Secondary | ICD-10-CM | POA: Insufficient documentation

## 2024-01-28 NOTE — Therapy (Signed)
 OUTPATIENT PHYSICAL THERAPY NEURO EVALUATION   Patient Name: Theresa Reid MRN: [id number] DOB:May 31, 1940, 83 y.o., female Today's Date: 01/28/2024   PCP: Abbey Bruckner, MD  REFERRING PROVIDER: Abbey Bruckner, MD  END OF SESSION:   PT End of Session - 01/28/24 1453     Visit Number 1    Number of Visits 24    Date for Recertification  04/21/24    PT Start Time 1453    PT Stop Time 1540    PT Time Calculation (min) 47 min    Equipment Utilized During Treatment Gait belt    Activity Tolerance Patient tolerated treatment well    Behavior During Therapy [facility] for tasks assessed/performed          Past Medical History:  Diagnosis Date   Arthritis diagnosed in early 1970s   Breast cancer (HCC) January 2020   Needs mastectomy bra, mastectomy form and breast prothesis with adhesive, last signed and sent to [facility] on 12/04/2023   Cataract 2000   Chronic cough 12/15/2022   Diverticulitis    Dysuria 02/19/2017   Family history of breast cancer    GERD (gastroesophageal reflux disease)    Hyperlipidemia    Hypertension    Lymphedema    right arm   Personal history of chemotherapy    Personal history of radiation therapy    Pre-diabetes    Past Surgical History:  Procedure Laterality Date   APPLICATION OF INTRAOPERATIVE CT SCAN N/A 11/16/2021   Procedure: APPLICATION OF INTRAOPERATIVE CT SCAN;  Surgeon: Clois Fret, MD;  Location: [facility] ORS;  Service: Neurosurgery;  Laterality: N/A;   BREAST BIOPSY Right 05/28/2018   us  bx, INVASIVE MAMMARY CARCINOMA WITH LOBULAR FEATURES   BREAST LUMPECTOMY Right 06/12/2018   [facility], lobular features   BREAST LUMPECTOMY WITH SENTINEL LYMPH NODE BIOPSY Right 06/12/2018   Procedure: RIGHT BREAST WIDE EXCISION WITH SENTINEL LYMPH NODE BX;  Surgeon: Dessa Reyes ORN, MD;  Location: [facility] OR[facility];  Service: General;  Laterality: Right;   CHOLECYSTECTOMY  1997   GALLBLADDER SURGERY  1997   INCISION AND DRAINAGE OF WOUND Right  10/30/2018   Procedure: IRRIGATION AND DEBRIDEMENT RIGHT CHEST WALL WOUND;  Surgeon: Dessa Reyes ORN, MD;  Location: [facility] OR[facility];  Service: General;  Laterality: Right;   JOINT REPLACEMENT  2012 and 2014   both knees   MASTECTOMY Right    MASTECTOMY WITH AXILLARY LYMPH NODE DISSECTION Right 09/20/2018   Procedure: MASTECTOMY WITH AXILLARY LYMPH NODE DISSECTION RIGHT;  Surgeon: Dessa Reyes ORN, MD;  Location: [facility] OR[facility];  Service: General;  Laterality: Right;   ovaraian cyst removal Right    Cyst only (NOT OVARY)   PORTACATH PLACEMENT Left 09/20/2018   Procedure: INSERTION PORT-A-CATH, LEFT;  Surgeon: Dessa Reyes ORN, MD;  Location: [facility] OR[facility];  Service: General;  Laterality: Left;   POSTERIOR CERVICAL FUSION/FORAMINOTOMY N/A 11/16/2021   Procedure: C1-2 POSTERIOR FUSION WITH ORIF (OPEN REDUCTION INTERNAL FIXATION) C2 FRACTURE (GLOBUS KORE FIBER);  Surgeon: Clois Fret, MD;  Location: [facility];  Service: Neurosurgery;  Laterality: N/A;   SPINE SURGERY  November 16, 2021   TOTAL KNEE ARTHROPLASTY Bilateral 05/05/2010   Patient Active Problem List   Diagnosis Date Noted   Claustrophobia 07/23/2023   Lymphedema 04/11/2023   Diverticulitis 03/19/2023   Disequilibrium 12/15/2022   Fingernail abnormalities 11/01/2022   Neuropathy 11/01/2022   Palpitations 07/18/2022   Trapezius strain 05/03/2022   History of diverticulitis 05/03/2022   Fatigue 01/04/2022   S/P cervical spinal fusion 11/16/2021  Dens fracture (HCC) 11/04/2021   Pain in right shoulder 09/27/2021   Prediabetes 12/16/2020   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   Cataract 09/24/2018   Urge incontinence of urine 09/24/2018   Breast cancer (HCC) 06/07/2018   Family history of breast cancer    Dairy product intolerance 01/14/2018   Visual disturbance 10/03/2016   GERD (gastroesophageal reflux disease) 09/14/2016   HLD (hyperlipidemia) 08/13/2015   Seborrheic dermatitis 07/01/2015   Hypertension 11/13/2014    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    ONSET DATE: October 2024  REFERRING DIAG:  Disequilibrium  - Primary    THERAPY DIAG:  Disequilibrium  Muscle weakness (generalized)  Unsteadiness on feet  Rationale for Evaluation and Treatment: Rehabilitation  SUBJECTIVE:                                                                                                                                                                                             SUBJECTIVE STATEMENT: Pt reporting that she had 4 episodes since last year (2 episodes last year, 2 earlier this year) where she found myself falling. Last episode noted to be end of March 2025, which occured at dentist office. Pt describes as Like I step out of my body for a few seconds, then step back in. Pt reports that episodes occur with no warning. Pt reporting that she had 2 MRIs, without significant findings.   Neurosurgeon followed up after episodes.   Pt reporting hx of neuropathy in hands & feet; occurring at fingertips & little ways up the side of the legs.    Pt accompanied by: family member, daughter Annabella  PERTINENT HISTORY: Per Family Medicine MD 10/23/23: Feeling unsteady: Lasting for about 3-4 seconds, mostly feeling like she is going to fall on the left side. Episode first happened last fall. She denies change in vision, chest pain, palpations when these episodes are happening. No obvious trigger. No vertigo when these symptoms are happening. She has not sustained a fall. She is using a walker/cane for ambulation. Last episode was around May 2025.    PAIN: I have arthritis from the neck down, everything always hurts.  Are you having pain? Yes: NPRS scale: 1/10 Pain location: all joints Pain description: inflamed, sore joints  Aggravating factors: movement Relieving factors: none reported  PRECAUTIONS: Fall  RED FLAGS: None   WEIGHT BEARING RESTRICTIONS: No  FALLS: Has patient fallen in  last 6 months? No  LIVING ENVIRONMENT: Lives with: lives alone Lives in: House/apartment Stairs: No Has following equipment at home: Single point cane, Environmental consultant - 2 wheeled, Environmental consultant - 4 wheeled, [employer], Grab bars, and  Ramped entry  PLOF: Independent with basic ADLs, Independent with household mobility without device, and Independent with community mobility with device; daughter helps with laundry.   PATIENT GOALS: I'm not sure why I got sent here. Unable to ID goal at this time.   OBJECTIVE:  Note: Objective measures were completed at Evaluation unless otherwise noted.  DIAGNOSTIC FINDINGS: Per family medicine MD 10/23/23: MRI: last MRI was on 08/28/23. 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 Has seen neurosurgery for this and is recommended no intervention as it has been stable.   COGNITION: Overall cognitive status: Within functional limits for tasks assessed   SENSATION: Light touch: Not tested and reported to be diminished secondary to hx of neuropathy.     POSTURE: rounded shoulders and forward head  LOWER EXTREMITY ROM:     Active  WFL for all tasks assessed Right Eval Left Eval  Hip flexion    Hip extension    Hip abduction    Hip adduction    Hip internal rotation    Hip external rotation    Knee flexion    Knee extension    Ankle dorsiflexion    Ankle plantarflexion    Ankle inversion    Ankle eversion     (Blank rows = not tested)  LOWER EXTREMITY MMT:    MMT Right Eval Left Eval  Hip flexion 4- 3+  Hip extension    Hip abduction 4+ 4+  Hip adduction 4+ 4+  Hip internal rotation    Hip external rotation    Knee flexion 4- 4-  Knee extension 4- 4-  Ankle dorsiflexion 4+ 4+  Ankle plantarflexion    Ankle inversion    Ankle eversion    (Blank rows = not tested)  BED MOBILITY:  Not tested  TRANSFERS: Sit to stand: Modified independence  Assistive device utilized: requiring BUE support     Stand to sit: Modified independence   Assistive device utilized: requiring BUE support for controlled descent      RAMP:  Not tested  CURB:  Not tested  STAIRS: Not tested; pt reports that she does not do stairs in community settings.  GAIT: Findings: Gait Characteristics: step through pattern, trendelenburg, narrow BOS, poor foot clearance- Right, and poor foot clearance- Left, Distance walked: ambulation into & out of clinic, and Assistive device utilized:Walker - 4 wheeled  FUNCTIONAL TESTS:  5 times sit to stand: 22.45 requiring BUE support 10 meter walk test:  Average Normal speed: 0.57 m/s without AD, CGA; Average Fast speed: 0.65 m/s without AD, CGA    [facility] PT Assessment - 01/28/24 0001       Balance   Balance Assessed Yes      Standardized Balance Assessment   Standardized Balance Assessment Berg Balance Test      Berg Balance Test   Sit to Stand Able to stand  independently using hands    Standing Unsupported Able to stand safely 2 minutes    Sitting with Back Unsupported but Feet Supported on Floor or Stool Able to sit safely and securely 2 minutes    Stand to Sit Controls descent by using hands    Transfers Able to transfer safely, definite need of hands    Standing Unsupported with Eyes Closed Able to stand 10 seconds with supervision    Standing Unsupported with Feet Together Able to place feet together independently and stand for 1 minute with supervision    From Standing, Reach Forward with Outstretched Arm Can  reach forward >12 cm safely (5)    From Standing Position, Pick up Object from Floor Unable to pick up shoe, but reaches 2-5 cm (1-2) from shoe and balances independently    From Standing Position, Turn to Look Behind Over each Shoulder Turn sideways only but maintains balance    Turn 360 Degrees Able to turn 360 degrees safely but slowly    Standing Unsupported, Alternately Place Feet on Step/Stool Able to complete >2 steps/needs minimal assist    Standing Unsupported, One Foot in [employer] balance while stepping or standing    Standing on One Leg Tries to lift leg/unable to hold 3 seconds but remains standing independently    Total Score 34           PATIENT SURVEYS:  ABC scale: The Activities-Specific Balance Confidence (ABC) Scale 0% 10 20 30  40 50 60 70 80 90 100% No confidence<->completely confident  "How confident are you that you will not lose your balance or become unsteady when you . . .   Date tested 01/28/24  Walk around the house 90%  2. Walk up or down stairs 30%  3. Bend over and pick up a slipper from in front of a closet floor 100%  4. Reach for a small can off a shelf at eye level 100%  5. Stand on tip toes and reach for something above your head 0%  6. Stand on a chair and reach for something 0%  7. Sweep the floor 100%  8. Walk outside the house to a car parked in the driveway 90%  9. Get into or out of a car 100%  10. Walk across a parking lot to the mall 90%  11. Walk up or down a ramp 100%  12. Walk in a crowded mall where people rapidly walk past you 50%  13. Are bumped into by people as you walk through the mall 50%  14. Step onto or off of an escalator while you are holding onto the railing 0%  15. Step onto or off an escalator while holding onto parcels such that you cannot hold onto the railing 0%  16. Walk outside on icy sidewalks 0%  Total: #/16 860 / 1600 = 53.8 %                                                                                                                                 TREATMENT DATE: 01/28/2024 Today's session focused on initial evaluation. Completed above physical performance measures & self-reported outcome measures.    10 Meter Walk Test: Patient instructed to walk 10 meters (32.8 ft) as quickly and as safely as possible at their normal speed x2 and at a fast speed x2. Time measured from 2 meter mark to 8 meter mark to accommodate ramp-up and ramp-down.  Normal speed 1: 0.54 m/s (18.35 seconds) Normal  speed 2: 0.59 m/s (16.85 seconds) Average Normal speed: 0.57  m/s without AD, CGA Fast speed 1: 0.65 m/s (15.35 seconds) Fast speed 2: 0.65 m/s (15.5 seconds) Average Fast speed: 0.65 m/s without AD, CGA Cut off scores: <0.4 m/s = household Ambulator, 0.4-0.8 m/s = limited community Ambulator, >0.8 m/s = community Ambulator, >1.2 m/s = crossing a street, <1.0 = increased fall risk MCID 0.05 m/s (small), 0.13 m/s (moderate), 0.06 m/s (significant)  (ANPTA Core Set of Outcome Measures for Adults with Neurologic Conditions, 2018)   Pt educated on findings of evaluation & potential benefit of PT for strength & balance. POC discussed to include focus on establishing HEP for self management.       PATIENT EDUCATION: Education details: POC; findings of evaluation.  Person educated: Patient and Child(ren) Education method: Explanation Education comprehension: verbalized understanding and needs further education  HOME EXERCISE PROGRAM: To be established at future visit.   GOALS: Goals reviewed with patient? Yes  SHORT TERM GOALS: Target date: 03/10/24  Patient will be independent with home exercise program to improve strength/mobility for increased functional independence with ADLs and mobility. Baseline: to be established at future session Goal status: INITIAL   LONG TERM GOALS: Target date: 04/21/24  Patient (> 76 years old) will complete five times sit to stand test (5XSTS) in < 15 seconds indicating an increased LE strength and improved balance. Baseline: 22.45 requiring BUE support Goal status: INITIAL  2.   Patient will increase Berg Balance score to > 51/56 to demonstrate improved balance and decreased fall risk during functional activities and ADLs. Baseline: 34/56 Goal status: INITIAL  3.  Patient will increase 10 meter walk test to >1.19m/s as to improve gait speed for better community ambulation and to reduce fall risk. Baseline: Average Normal speed: 0.57 m/s without  AD, CGA; Average Fast speed: 0.65 m/s without AD, CGA Goal status: INITIAL   ASSESSMENT:  CLINICAL IMPRESSION: Patient is a 83 y.o. female who was seen today for physical therapy evaluation and treatment for disequilibrium. Pt reporting 4 episodes of feeling like I step out of my body for a few seconds, then step back in, where she feels like she is going to fall. Pt unsure of why she was sent to PT; educated pt on findings of evaluation & potential benefit of PT to improve strength & balance. Pt with decreased functional strength as evidenced by 5xSTS, requiring 22.45 sec with BUE support. Pt also with decreased gait speed as evidenced by : 0.57 m/s-0.65 m/s without AD, CGA. Pt also with limited dynamic balance; 34/56 on BERG. Pt agreeable to establishing HEP with PT for self management. The pt will benefit from further skilled PT to improve these deficits in order to increase QOL and ease/safety with ADLs.     OBJECTIVE IMPAIRMENTS: Abnormal gait, decreased activity tolerance, decreased balance, decreased mobility, difficulty walking, decreased strength, and pain.   ACTIVITY LIMITATIONS: carrying, lifting, bending, standing, squatting, stairs, and locomotion level  PARTICIPATION LIMITATIONS: cleaning, laundry, shopping, and community activity  PERSONAL FACTORS: Age, Sex, and 3+ comorbidities:   arthritis, hx of CA, Hyperlipidemia, Hypertension, Lymphedema are also affecting patient's functional outcome.   REHAB POTENTIAL: Good  CLINICAL DECISION MAKING: Evolving/moderate complexity  EVALUATION COMPLEXITY: Moderate  PLAN:  PT FREQUENCY: 1-2x/week  PT DURATION: 12 weeks  PLANNED INTERVENTIONS: 97164- PT Re-evaluation, 97750- Physical Performance Testing, 97110-Therapeutic exercises, 97530- Therapeutic activity, [id number]- Neuromuscular re-education, 97535- Self Care, [id number]- Manual therapy, [id number]- Gait training, [phone]- Canalith repositioning, 79439 (1-2 muscles), 20561 (3+ muscles)-  Dry Needling, Patient/Family education, Balance training, Stair  training, Vestibular training, Cryotherapy, and Moist heat  PLAN FOR NEXT SESSION:  Establish HEP to address strength & balance deficits:  Hip strengthening Turning, SLS, stair tapping   Chiquita Silvan, Student-PT 01/28/2024, 6:05 PM

## 2024-01-30 ENCOUNTER — Ambulatory Visit: Admitting: Physical Therapy

## 2024-01-30 ENCOUNTER — Ambulatory Visit

## 2024-01-30 ENCOUNTER — Ambulatory Visit: Payer: Self-pay

## 2024-01-30 VITALS — BP 110/80 | HR 74 | Temp 98.2°F | Ht 63.0 in | Wt 191.4 lb

## 2024-01-30 DIAGNOSIS — E559 Vitamin D deficiency, unspecified: Secondary | ICD-10-CM

## 2024-01-30 DIAGNOSIS — Z8739 Personal history of other diseases of the musculoskeletal system and connective tissue: Secondary | ICD-10-CM | POA: Diagnosis not present

## 2024-01-30 DIAGNOSIS — E785 Hyperlipidemia, unspecified: Secondary | ICD-10-CM

## 2024-01-30 DIAGNOSIS — I10 Essential (primary) hypertension: Secondary | ICD-10-CM | POA: Diagnosis not present

## 2024-01-30 DIAGNOSIS — I890 Lymphedema, not elsewhere classified: Secondary | ICD-10-CM

## 2024-01-30 DIAGNOSIS — Z23 Encounter for immunization: Secondary | ICD-10-CM | POA: Diagnosis not present

## 2024-01-30 DIAGNOSIS — E538 Deficiency of other specified B group vitamins: Secondary | ICD-10-CM

## 2024-01-30 DIAGNOSIS — K219 Gastro-esophageal reflux disease without esophagitis: Secondary | ICD-10-CM | POA: Diagnosis not present

## 2024-01-30 DIAGNOSIS — R42 Dizziness and giddiness: Secondary | ICD-10-CM | POA: Diagnosis not present

## 2024-01-30 DIAGNOSIS — N3941 Urge incontinence: Secondary | ICD-10-CM

## 2024-01-30 DIAGNOSIS — M353 Polymyalgia rheumatica: Secondary | ICD-10-CM | POA: Insufficient documentation

## 2024-01-30 LAB — VITAMIN D 25 HYDROXY (VIT D DEFICIENCY, FRACTURES): VITD: 34.42 ng/mL (ref 30.00–100.00)

## 2024-01-30 LAB — VITAMIN B12: Vitamin B-12: 464 pg/mL (ref 211–911)

## 2024-01-30 MED ORDER — AMLODIPINE BESYLATE 5 MG PO TABS: 5.0000 mg | ORAL_TABLET | Freq: Every day | ORAL | 2 refills | Status: AC

## 2024-01-30 MED ORDER — OXYBUTYNIN CHLORIDE 5 MG PO TABS: 5.0000 mg | ORAL_TABLET | Freq: Two times a day (BID) | ORAL | 3 refills | Status: AC

## 2024-01-30 MED ORDER — OMEPRAZOLE 20 MG PO CPDR: 20.0000 mg | DELAYED_RELEASE_CAPSULE | Freq: Every day | ORAL | 2 refills | Status: AC

## 2024-01-30 MED ORDER — ATORVASTATIN CALCIUM 80 MG PO TABS
80.0000 mg | ORAL_TABLET | Freq: Every day | ORAL | 2 refills | Status: AC
Start: 2024-01-30 — End: ?

## 2024-01-30 NOTE — Progress Notes (Signed)
 Established Patient Office Visit   Subjective  Patient ID: Theresa Reid, female    DOB: Oct 16, 1940  Age: 83 y.o. MRN: [id number]  Chief Complaint  Patient presents with   Hypertension   Hyperlipidemia    She  has a past medical history of Arthritis (diagnosed in early 1970s), Breast cancer [facility]) (January 2020), Cataract (2000), Chronic cough (12/15/2022), Diverticulitis, Dysuria (02/19/2017), Family history of breast cancer, GERD (gastroesophageal reflux disease), Hyperlipidemia, Hypertension, Lymphedema, Personal history of chemotherapy, Personal history of radiation therapy, and Pre-diabetes.  HPI  Discussed the use of AI scribe software for clinical note transcription with the patient, who gave verbal consent to proceed.  History of Present Illness Theresa Reid is an 83 year old female with balance problems presenting for chronic medication management.   - She is scheduled to start physical therapy next week following an evaluation, with plans to continue through November. She has experienced balance issues throughout her life, recalling an inability to learn to ride a bicycle as a child.  - She has a h/o PMR. She takes prednisone  for flare-ups, of shoulder pain which has improved her shoulder condition. Her prednisone  regimen is 5 mg twice daily for three days, then tapered. She sees [facility] rheumatologist Dr. Jeannetta for this.   - She reports a recent episode of suspected urinary tract infection, but tests showed asymptomatic bacteriuria, and no antibiotics were prescribed. No urinary urgency, frequency, or other symptoms.  - She experiences chronic fatigue, which she attributes to past chemotherapy. She has a history of vitamin D  deficiency and borderline low B12 levels, which are being monitored. She engages in activities like sewing and knitting but loses interest quickly, suggesting a need for mental stimulation.   - BP stable, takes Amlodipine  5 mg daily.   - She is  on Atorvastatin  80 mg daily and denies concerns with medication.     ROS As per HPI    Objective:     BP 110/80 (BP Location: Left Arm, Patient Position: Sitting, Cuff Size: Normal)   Pulse 74   Temp 98.2 F (36.8 C) (Oral)   Ht 5' 3 (1.6 m)   Wt 191 lb 6.4 oz (86.8 kg)   SpO2 95%   BMI 33.90 kg/m      01/30/2024    1:20 PM 12/26/2023    1:55 PM 10/23/2023    1:03 PM  Depression screen PHQ 2/9  Decreased Interest 0 0 0  Down, Depressed, Hopeless 0 0 0  PHQ - 2 Score 0 0 0  Altered sleeping 0  0  Tired, decreased energy 0  0  Change in appetite 0  0  Feeling bad or failure about yourself  0  0  Trouble concentrating 0  0  Moving slowly or fidgety/restless 0  0  Suicidal thoughts 0  0  PHQ-9 Score 0  0  Difficult doing work/chores Not difficult at all  Not difficult at all      01/30/2024    1:20 PM 10/23/2023    1:03 PM 07/23/2023   10:55 AM 04/11/2023    1:52 PM  GAD 7 : Generalized Anxiety Score  Nervous, Anxious, on Edge 0 0 0 0  Control/stop worrying 0 0 0 0  Worry too much - different things 0 0 0 0  Trouble relaxing 0 0 0 0  Restless 0 0 0 0  Easily annoyed or irritable 0 0 0 0  Afraid - awful might happen 0 0 0 0  Total GAD 7 Score 0 0 0 0  Anxiety Difficulty Not difficult at all Not difficult at all  Not difficult at all      01/30/2024    1:20 PM 12/26/2023    1:55 PM 10/23/2023    1:03 PM  Depression screen PHQ 2/9  Decreased Interest 0 0 0  Down, Depressed, Hopeless 0 0 0  PHQ - 2 Score 0 0 0  Altered sleeping 0  0  Tired, decreased energy 0  0  Change in appetite 0  0  Feeling bad or failure about yourself  0  0  Trouble concentrating 0  0  Moving slowly or fidgety/restless 0  0  Suicidal thoughts 0  0  PHQ-9 Score 0  0  Difficult doing work/chores Not difficult at all  Not difficult at all      01/30/2024    1:20 PM 10/23/2023    1:03 PM 07/23/2023   10:55 AM 04/11/2023    1:52 PM  GAD 7 : Generalized Anxiety Score  Nervous, Anxious, on  Edge 0 0 0 0  Control/stop worrying 0 0 0 0  Worry too much - different things 0 0 0 0  Trouble relaxing 0 0 0 0  Restless 0 0 0 0  Easily annoyed or irritable 0 0 0 0  Afraid - awful might happen 0 0 0 0  Total GAD 7 Score 0 0 0 0  Anxiety Difficulty Not difficult at all Not difficult at all  Not difficult at all   SDOH Screenings   Food Insecurity: No Food Insecurity (10/22/2023)  Housing: Low Risk  (10/22/2023)  Transportation Needs: No Transportation Needs (10/22/2023)  Utilities: Not At Risk (06/11/2023)  Alcohol Screen: Low Risk  (06/11/2023)  Depression (PHQ2-9): Low Risk  (01/30/2024)  Financial Resource Strain: Low Risk  (10/22/2023)  Physical Activity: Insufficiently Active (10/22/2023)  Social Connections: Socially Isolated (10/22/2023)  Stress: No Stress Concern Present (10/22/2023)  Tobacco Use: Low Risk  (01/30/2024)  Health Literacy: Adequate Health Literacy (06/11/2023)     Physical Exam Constitutional:      General: She is not in acute distress.    Appearance: Normal appearance. She is obese.  HENT:     Head: Normocephalic and atraumatic.     Right Ear: Tympanic membrane and external ear normal. There is no impacted cerumen.     Left Ear: Tympanic membrane and external ear normal. There is no impacted cerumen.     Mouth/Throat:     Mouth: Mucous membranes are moist.  Neck:     Thyroid : No thyroid  mass or thyroid  tenderness.  Cardiovascular:     Rate and Rhythm: Normal rate and regular rhythm.  Pulmonary:     Effort: Pulmonary effort is normal.     Breath sounds: Normal breath sounds.  Abdominal:     General: Bowel sounds are normal.     Palpations: Abdomen is soft.     Tenderness: There is no abdominal tenderness.  Musculoskeletal:     Cervical back: Neck supple. No rigidity.     Comments: Non-pitting edema of b/l lower legs.   Skin:    General: Skin is warm.  Neurological:     Mental Status: She is alert and oriented to person, place, and time.     Cranial  Nerves: Cranial nerves 2-12 are intact. No dysarthria or facial asymmetry.     Sensory: No sensory deficit.     Gait: Gait abnormal (antalgic gait, favors right over left)  and tandem walk abnormal.  Psychiatric:        Mood and Affect: Mood normal.        Behavior: Behavior normal. Behavior is cooperative.        No results found for any visits on 01/30/24.  The ASCVD Risk score (Arnett DK, et al., 2019) failed to calculate for the following reasons:   The 2019 ASCVD risk score is only valid for ages 79 to 41     Assessment & Plan:   Needs flu shot Assessment & Plan: Updated today.   Orders: -     Flu vaccine HIGH DOSE PF(Fluzone Trivalent)  B12 deficiency Assessment & Plan: History of low normal B12 about 4 years ago, currently takes daily multivitamin. Has chronic fatigue. Check B12 today.  Orders: -     Vitamin B12  Vitamin D  deficiency Assessment & Plan: H/O vitamin D  deficiency about 46m ago. Repeat vitamin D  level today.   Orders: -     VITAMIN D  25 Hydroxy (Vit-D Deficiency, Fractures)  Essential hypertension -     amLODIPine  Besylate; Take 1 tablet (5 mg total) by mouth daily.  Dispense: 100 tablet; Refill: 2  Hyperlipidemia, unspecified hyperlipidemia type Assessment & Plan: Chronic, tolerating Atorvastatin  80 mg daily, continue.   Orders: -     Atorvastatin  Calcium ; Take 1 tablet (80 mg total) by mouth daily.  Dispense: 100 tablet; Refill: 2  Gastroesophageal reflux disease without esophagitis Assessment & Plan: Symptoms stable on Omeprazole  20 mg daily, continue.   Orders: -     Omeprazole ; Take 1 capsule (20 mg total) by mouth daily.  Dispense: 100 capsule; Refill: 2  Urge incontinence of urine Assessment & Plan: Currently on Oxybutynin  5 mg twice a day with symptoms stable. Reviewed UA, urine culture from 11/2023. Continue.  Orders: -     oxyBUTYnin  Chloride; Take 1 tablet (5 mg total) by mouth 2 (two) times daily.  Dispense: 180 tablet;  Refill: 3  Disequilibrium Assessment & Plan: Is starting physical therapy for balance.    History of polymyalgia rheumatica Assessment & Plan: Continue f/u with rheumatologist Dr. Jeannetta, no new concerns today.    Primary hypertension Assessment & Plan: BP well controlled, continue Amlodipine  5 mg daily.    Lymphedema Assessment & Plan: Chronic, recommend wearing compression socks 25 mmHg pressure during day time, raising legs to help with swelling.      Return in about 4 months (around 06/01/2024) for Chronic follow up .   Luke Shade, MD

## 2024-01-30 NOTE — Assessment & Plan Note (Signed)
 H/O vitamin D  deficiency about 80m ago. Repeat vitamin D  level today.

## 2024-01-30 NOTE — Assessment & Plan Note (Signed)
 Chronic, recommend wearing compression socks 25 mmHg pressure during day time, raising legs to help with swelling.

## 2024-01-30 NOTE — Assessment & Plan Note (Signed)
 Is starting physical therapy for balance.

## 2024-01-30 NOTE — Assessment & Plan Note (Signed)
 Updated today.

## 2024-01-30 NOTE — Assessment & Plan Note (Signed)
 Currently on Oxybutynin  5 mg twice a day with symptoms stable. Reviewed UA, urine culture from 11/2023. Continue.

## 2024-01-30 NOTE — Assessment & Plan Note (Signed)
 Continue f/u with rheumatologist Dr. Jeannetta, no new concerns today.

## 2024-01-30 NOTE — Assessment & Plan Note (Signed)
 History of low normal B12 about 4 years ago, currently takes daily multivitamin. Has chronic fatigue. Check B12 today.

## 2024-01-30 NOTE — Assessment & Plan Note (Signed)
 Chronic, tolerating Atorvastatin  80 mg daily, continue.

## 2024-01-30 NOTE — Assessment & Plan Note (Signed)
>   BP well controlled,   > continue Amlodipine 5 mg daily

## 2024-01-30 NOTE — Assessment & Plan Note (Signed)
 Symptoms stable on Omeprazole  20 mg daily, continue.

## 2024-02-06 ENCOUNTER — Ambulatory Visit: Admitting: Physical Therapy

## 2024-02-06 NOTE — Therapy (Incomplete)
 OUTPATIENT PHYSICAL THERAPY NEURO TREATMENT   Patient Name: CHARLETHA DALPE MRN: [id number] DOB:1941/03/27, 83 y.o., female Today's Date: 02/06/2024   PCP: Abbey Bruckner, MD  REFERRING PROVIDER: Abbey Bruckner, MD  END OF SESSION: ***    Past Medical History:  Diagnosis Date   Arthritis diagnosed in early 45s   Breast cancer [facility]) January 2020   Needs mastectomy bra, mastectomy form and breast prothesis with adhesive, last signed and sent to [facility] family on 12/04/2023   Cataract 2000   Chronic cough 12/15/2022   Diverticulitis    Dysuria 02/19/2017   Family history of breast cancer    GERD (gastroesophageal reflux disease)    Hyperlipidemia    Hypertension    Lymphedema    right arm   Personal history of chemotherapy    Personal history of radiation therapy    Pre-diabetes    Past Surgical History:  Procedure Laterality Date   APPLICATION OF INTRAOPERATIVE CT SCAN N/A 11/16/2021   Procedure: APPLICATION OF INTRAOPERATIVE CT SCAN;  Surgeon: Clois Fret, MD;  Location: [facility] ORS;  Service: Neurosurgery;  Laterality: N/A;   BREAST BIOPSY Right 05/28/2018   us  bx, INVASIVE MAMMARY CARCINOMA WITH LOBULAR FEATURES   BREAST LUMPECTOMY Right 06/12/2018   [facility], lobular features   BREAST LUMPECTOMY WITH SENTINEL LYMPH NODE BIOPSY Right 06/12/2018   Procedure: RIGHT BREAST WIDE EXCISION WITH SENTINEL LYMPH NODE BX;  Surgeon: Dessa Reyes ORN, MD;  Location: [facility] ORS;  Service: General;  Laterality: Right;   CHOLECYSTECTOMY  1997   GALLBLADDER SURGERY  1997   INCISION AND DRAINAGE OF WOUND Right 10/30/2018   Procedure: IRRIGATION AND DEBRIDEMENT RIGHT CHEST WALL WOUND;  Surgeon: Dessa Reyes ORN, MD;  Location: [facility] ORS;  Service: General;  Laterality: Right;   JOINT REPLACEMENT  2012 and 2014   both knees   MASTECTOMY Right    MASTECTOMY WITH AXILLARY LYMPH NODE DISSECTION Right 09/20/2018   Procedure: MASTECTOMY WITH AXILLARY LYMPH NODE DISSECTION RIGHT;   Surgeon: Dessa Reyes ORN, MD;  Location: [facility] ORS;  Service: General;  Laterality: Right;   ovaraian cyst removal Right    Cyst only (NOT OVARY)   PORTACATH PLACEMENT Left 09/20/2018   Procedure: INSERTION PORT-A-CATH, LEFT;  Surgeon: Dessa Reyes ORN, MD;  Location: [facility] ORS;  Service: General;  Laterality: Left;   POSTERIOR CERVICAL FUSION/FORAMINOTOMY N/A 11/16/2021   Procedure: C1-2 POSTERIOR FUSION WITH ORIF (OPEN REDUCTION INTERNAL FIXATION) C2 FRACTURE (GLOBUS KORE FIBER);  Surgeon: Clois Fret, MD;  Location: [facility] ORS;  Service: Neurosurgery;  Laterality: N/A;   SPINE SURGERY  November 16, 2021   TOTAL KNEE ARTHROPLASTY Bilateral 05/05/2010   Patient Active Problem List   Diagnosis Date Noted   B12 deficiency 01/30/2024   Vitamin D  deficiency 01/30/2024   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   Claustrophobia 07/23/2023   Lymphedema 04/11/2023   Disequilibrium 12/15/2022   Fingernail abnormalities 11/01/2022   Neuropathy 11/01/2022   Palpitations 07/18/2022   Trapezius strain 05/03/2022   History of diverticulitis 05/03/2022   Fatigue 01/04/2022   S/P cervical spinal fusion 11/16/2021   Dens fracture (HCC) 11/04/2021   Pain in right shoulder 09/27/2021   Prediabetes 12/16/2020   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   Cataract 09/24/2018   Urge incontinence of urine 09/24/2018   Breast cancer (HCC) 06/07/2018   Family history of breast cancer    Dairy product intolerance 01/14/2018   Visual disturbance 10/03/2016   GERD (gastroesophageal reflux disease) 09/14/2016   HLD (hyperlipidemia) 08/13/2015   Seborrheic  dermatitis 07/01/2015   Needs flu shot 02/14/2015   Hypertension 11/13/2014   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    ONSET DATE: October 2024  REFERRING DIAG:  Disequilibrium  - Primary    THERAPY DIAG: *** No diagnosis found.  Rationale for Evaluation and Treatment: Rehabilitation  SUBJECTIVE:                                                                                                                                                                                              SUBJECTIVE STATEMENT: *** Pt reporting that she had 4 episodes since last year (2 episodes last year, 2 earlier this year) where she found myself falling. Last episode noted to be end of March 2025, which occured at dentist office. Pt describes as Like I step out of my body for a few seconds, then step back in. Pt reports that episodes occur with no warning. Pt reporting that she had 2 MRIs, without significant findings.   Neurosurgeon followed up after episodes.   Pt reporting hx of neuropathy in hands & feet; occurring at fingertips & little ways up the side of the legs.    Pt accompanied by: family member, daughter Annabella  PERTINENT HISTORY: Per Family Medicine MD 10/23/23: Feeling unsteady: Lasting for about 3-4 seconds, mostly feeling like she is going to fall on the left side. Episode first happened last fall. She denies change in vision, chest pain, palpations when these episodes are happening. No obvious trigger. No vertigo when these symptoms are happening. She has not sustained a fall. She is using a walker/cane for ambulation. Last episode was around May 2025.    PAIN: I have arthritis from the neck down, everything always hurts.  Are you having pain? Yes: NPRS scale: 1/10 Pain location: all joints Pain description: inflamed, sore joints  Aggravating factors: movement Relieving factors: none reported  PRECAUTIONS: Fall  RED FLAGS: None   WEIGHT BEARING RESTRICTIONS: No  FALLS: Has patient fallen in last 6 months? No  LIVING ENVIRONMENT: Lives with: lives alone Lives in: House/apartment Stairs: No Has following equipment at home: Single point cane, Environmental consultant - 2 wheeled, Environmental consultant - 4 wheeled, Tour manager, Grab bars, and Ramped entry  PLOF: Independent with basic ADLs, Independent with household mobility without  device, and Independent with community mobility with device; daughter helps with laundry.   PATIENT GOALS: I'm not sure why I got sent here. Unable to ID goal at this time.   OBJECTIVE:  Note: Objective measures were completed at Evaluation unless otherwise noted.  DIAGNOSTIC FINDINGS: Per family medicine MD 10/23/23: MRI: last MRI was on 08/28/23.  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 Has seen neurosurgery for this and is recommended no intervention as it has been stable.   COGNITION: Overall cognitive status: Within functional limits for tasks assessed   SENSATION: Light touch: Not tested and reported to be diminished secondary to hx of neuropathy.     POSTURE: rounded shoulders and forward head  LOWER EXTREMITY ROM:     Active  WFL for all tasks assessed Right Eval Left Eval  Hip flexion    Hip extension    Hip abduction    Hip adduction    Hip internal rotation    Hip external rotation    Knee flexion    Knee extension    Ankle dorsiflexion    Ankle plantarflexion    Ankle inversion    Ankle eversion     (Blank rows = not tested)  LOWER EXTREMITY MMT:    MMT Right Eval Left Eval  Hip flexion 4- 3+  Hip extension    Hip abduction 4+ 4+  Hip adduction 4+ 4+  Hip internal rotation    Hip external rotation    Knee flexion 4- 4-  Knee extension 4- 4-  Ankle dorsiflexion 4+ 4+  Ankle plantarflexion    Ankle inversion    Ankle eversion    (Blank rows = not tested)  BED MOBILITY:  Not tested  TRANSFERS: Sit to stand: Modified independence  Assistive device utilized: requiring BUE support     Stand to sit: Modified independence  Assistive device utilized: requiring BUE support for controlled descent      RAMP:  Not tested  CURB:  Not tested  STAIRS: Not tested; pt reports that she does not do stairs in community settings.  GAIT: Findings: Gait Characteristics: step through pattern, trendelenburg, narrow BOS, poor foot clearance-  Right, and poor foot clearance- Left, Distance walked: ambulation into & out of clinic, and Assistive device utilized:Walker - 4 wheeled  FUNCTIONAL TESTS:  5 times sit to stand: 22.45 requiring BUE support 10 meter walk test:  Average Normal speed: 0.57 m/s without AD, CGA; Average Fast speed: 0.65 m/s without AD, CGA       PATIENT SURVEYS:  ABC scale: The Activities-Specific Balance Confidence (ABC) Scale 0% 10 20 30  40 50 60 70 80 90 100% No confidence<->completely confident  "How confident are you that you will not lose your balance or become unsteady when you . . .   Date tested 01/28/24  Walk around the house 90%  2. Walk up or down stairs 30%  3. Bend over and pick up a slipper from in front of a closet floor 100%  4. Reach for a small can off a shelf at eye level 100%  5. Stand on tip toes and reach for something above your head 0%  6. Stand on a chair and reach for something 0%  7. Sweep the floor 100%  8. Walk outside the house to a car parked in the driveway 90%  9. Get into or out of a car 100%  10. Walk across a parking lot to the mall 90%  11. Walk up or down a ramp 100%  12. Walk in a crowded mall where people rapidly walk past you 50%  13. Are bumped into by people as you walk through the mall 50%  14. Step onto or off of an escalator while you are holding onto the railing 0%  15. Step onto or off an escalator while holding onto  parcels such that you cannot hold onto the railing 0%  16. Walk outside on icy sidewalks 0%  Total: #/16 860 / 1600 = 53.8 %                                                                                                                                 TREATMENT DATE: 02/06/2024 ***   Today's session focused on initial evaluation. Completed above physical performance measures & self-reported outcome measures.    10 Meter Walk Test: Patient instructed to walk 10 meters (32.8 ft) as quickly and as safely as possible at their normal  speed x2 and at a fast speed x2. Time measured from 2 meter mark to 8 meter mark to accommodate ramp-up and ramp-down.  Normal speed 1: 0.54 m/s (18.35 seconds) Normal speed 2: 0.59 m/s (16.85 seconds) Average Normal speed: 0.57 m/s without AD, CGA Fast speed 1: 0.65 m/s (15.35 seconds) Fast speed 2: 0.65 m/s (15.5 seconds) Average Fast speed: 0.65 m/s without AD, CGA Cut off scores: <0.4 m/s = household Ambulator, 0.4-0.8 m/s = limited community Ambulator, >0.8 m/s = community Ambulator, >1.2 m/s = crossing a street, <1.0 = increased fall risk MCID 0.05 m/s (small), 0.13 m/s (moderate), 0.06 m/s (significant)  (ANPTA Core Set of Outcome Measures for Adults with Neurologic Conditions, 2018)   Pt educated on findings of evaluation & potential benefit of PT for strength & balance. POC discussed to include focus on establishing HEP for self management.       PATIENT EDUCATION: Education details: POC; findings of evaluation.  Person educated: Patient and Child(ren) Education method: Explanation Education comprehension: verbalized understanding and needs further education  HOME EXERCISE PROGRAM: To be established at future visit.   GOALS: Goals reviewed with patient? Yes  SHORT TERM GOALS: Target date: 03/10/24  Patient will be independent with home exercise program to improve strength/mobility for increased functional independence with ADLs and mobility. Baseline: to be established at future session Goal status: INITIAL   LONG TERM GOALS: Target date: 04/21/24  Patient (> 37 years old) will complete five times sit to stand test (5XSTS) in < 15 seconds indicating an increased LE strength and improved balance. Baseline: 22.45 requiring BUE support Goal status: INITIAL  2.   Patient will increase Berg Balance score to > 51/56 to demonstrate improved balance and decreased fall risk during functional activities and ADLs. Baseline: 34/56 Goal status: INITIAL  3.  Patient will  increase 10 meter walk test to >1.67m/s as to improve gait speed for better community ambulation and to reduce fall risk. Baseline: Average Normal speed: 0.57 m/s without AD, CGA; Average Fast speed: 0.65 m/s without AD, CGA Goal status: INITIAL   ASSESSMENT:  CLINICAL IMPRESSION: *** Patient is a 83 y.o. female who was seen today for physical therapy evaluation and treatment for disequilibrium. Pt reporting 4 episodes of feeling like I step out of my body for a few seconds,  then step back in, where she feels like she is going to fall. Pt unsure of why she was sent to PT; educated pt on findings of evaluation & potential benefit of PT to improve strength & balance. Pt with decreased functional strength as evidenced by 5xSTS, requiring 22.45 sec with BUE support. Pt also with decreased gait speed as evidenced by : 0.57 m/s-0.65 m/s without AD, CGA. Pt also with limited dynamic balance; 34/56 on BERG. Pt agreeable to establishing HEP with PT for self management. The pt will benefit from further skilled PT to improve these deficits in order to increase QOL and ease/safety with ADLs.     OBJECTIVE IMPAIRMENTS: Abnormal gait, decreased activity tolerance, decreased balance, decreased mobility, difficulty walking, decreased strength, and pain.   ACTIVITY LIMITATIONS: carrying, lifting, bending, standing, squatting, stairs, and locomotion level  PARTICIPATION LIMITATIONS: cleaning, laundry, shopping, and community activity  PERSONAL FACTORS: Age, Sex, and 3+ comorbidities:   arthritis, hx of CA, Hyperlipidemia, Hypertension, Lymphedema are also affecting patient's functional outcome.   REHAB POTENTIAL: Good  CLINICAL DECISION MAKING: Evolving/moderate complexity  EVALUATION COMPLEXITY: Moderate  PLAN:  PT FREQUENCY: 1-2x/week  PT DURATION: 12 weeks  PLANNED INTERVENTIONS: 97164- PT Re-evaluation, 97750- Physical Performance Testing, 97110-Therapeutic exercises, 97530- Therapeutic  activity, [id number]- Neuromuscular re-education, 97535- Self Care, [id number]- Manual therapy, [id number]- Gait training, [phone]- Canalith repositioning, [phone] (1-2 muscles), 20561 (3+ muscles)- Dry Needling, Patient/Family education, Balance training, Stair training, Vestibular training, Cryotherapy, and Moist heat  PLAN FOR NEXT SESSION: *** Establish HEP to address strength & balance deficits:  Hip strengthening Turning, SLS, stair tapping   Chiquita Silvan, Student-PT 02/06/2024, 10:08 AM

## 2024-02-11 ENCOUNTER — Ambulatory Visit: Admitting: Physical Therapy

## 2024-02-12 ENCOUNTER — Ambulatory Visit: Admitting: Physical Therapy

## 2024-02-12 ENCOUNTER — Ambulatory Visit

## 2024-02-13 ENCOUNTER — Ambulatory Visit: Admitting: Physical Therapy

## 2024-02-18 ENCOUNTER — Ambulatory Visit: Admitting: Physical Therapy

## 2024-02-19 ENCOUNTER — Ambulatory Visit: Admitting: Physical Therapy

## 2024-02-20 ENCOUNTER — Ambulatory Visit: Admitting: Physical Therapy

## 2024-02-25 ENCOUNTER — Ambulatory Visit: Admitting: Physical Therapy

## 2024-02-26 ENCOUNTER — Ambulatory Visit: Admitting: Physical Therapy

## 2024-02-27 ENCOUNTER — Ambulatory Visit: Admitting: Pain Medicine

## 2024-02-27 ENCOUNTER — Ambulatory Visit: Admitting: Physical Therapy

## 2024-03-03 ENCOUNTER — Ambulatory Visit

## 2024-03-05 ENCOUNTER — Ambulatory Visit

## 2024-03-10 ENCOUNTER — Ambulatory Visit

## 2024-03-12 ENCOUNTER — Ambulatory Visit

## 2024-03-17 ENCOUNTER — Ambulatory Visit

## 2024-04-15 ENCOUNTER — Ambulatory Visit: Admitting: Neurosurgery

## 2024-05-05 ENCOUNTER — Telehealth: Payer: Self-pay

## 2024-05-05 NOTE — Telephone Encounter (Signed)
 Noted. Plan on counseling on advance care planning during upcoming office visit.  Luke Shade, MD

## 2024-05-05 NOTE — Telephone Encounter (Signed)
 Copied from CRM [id number]. Topic: General - Registration Update >> May 05, 2024 10:11 AM Zy'onna H wrote: Patient representative - daughter is calling to request an update to the patients documents. Tiffany called in stating her mother has not updated her Medical documents/POA in over 20 years, and every time the daughter suggests these forms be update with the clinic, the patient declines.   If on her next appt 05/06/2024 - getting these forms updated be highly suggested to the patient that'd be great.

## 2024-05-06 ENCOUNTER — Ambulatory Visit

## 2024-05-14 ENCOUNTER — Encounter: Payer: Self-pay | Admitting: Nurse Practitioner

## 2024-05-14 ENCOUNTER — Ambulatory Visit
Admission: RE | Admit: 2024-05-14 | Discharge: 2024-05-14 | Disposition: A | Discharge status: Home / Self Care | Source: Ambulatory Visit | Attending: Nurse Practitioner | Admitting: Nurse Practitioner

## 2024-05-14 ENCOUNTER — Ambulatory Visit: Admitting: Nurse Practitioner

## 2024-05-14 VITALS — BP 124/80 | HR 78 | Temp 97.9°F | Ht 63.0 in | Wt 188.2 lb

## 2024-05-14 DIAGNOSIS — Z8719 Personal history of other diseases of the digestive system: Secondary | ICD-10-CM | POA: Diagnosis not present

## 2024-05-14 DIAGNOSIS — R1032 Left lower quadrant pain: Secondary | ICD-10-CM | POA: Insufficient documentation

## 2024-05-14 MED ORDER — IOHEXOL 9 MG/ML PO SOLN
500.0000 mL | ORAL | Status: AC
  Administered 2024-05-14 (×2): 500 mL via ORAL

## 2024-05-14 MED ORDER — IOHEXOL 300 MG/ML  SOLN
85.0000 mL | Freq: Once | INTRAMUSCULAR | Status: AC | PRN
  Administered 2024-05-14: 85 mL via INTRAVENOUS

## 2024-05-14 NOTE — Progress Notes (Signed)
 " Leron Glance, NP-C Phone: [phone]  Theresa Reid is a 84 y.o. female who presents today for possible diverticulitis.  Discussed the use of AI scribe software for clinical note transcription with the patient, who gave verbal consent to proceed.  History of Present Illness   Theresa Reid is an 84 year old female with diverticulitis who presents with recurrent abdominal symptoms.  She has been experiencing recurrent abdominal symptoms since right before Thanksgiving, approximately two months ago. The symptoms have a pattern of recurrence, described as 'come back, fade away, and come back again.'  In November 2024, she experienced similar symptoms and was treated with antibiotics. However, during a visit to the emergency room on Thanksgiving Day, she was diagnosed with gastroenteritis instead of diverticulitis.  Currently, she experiences left lower quadrant pain that is sometimes tender and feels 'swollen' or 'bruised.' She also has episodes of frequent bowel movements starting around 5 AM, occurring twice a week, which last for about two to three hours and are accompanied by gas. The bowel movements become progressively softer, but she feels fine for the rest of the day. No nausea, vomiting, or blood in stool.  She has not been able to correlate these symptoms with any specific food or food group. For management, she has been using a liquid diet for a day or two and then gradually reintroducing normal foods, which temporarily alleviates the symptoms before they recur.  She mentions having abdominal problems since undergoing chemotherapy in 2020. She has not consulted with gastroenterology for these issues.      Tobacco Use History[1]  Medications Ordered Prior to Encounter[2]   ROS see history of present illness  Objective  Physical Exam Vitals:   05/14/24 0855  BP: 124/80  Pulse: 78  Temp: 97.9 F (36.6 C)  SpO2: 95%    BP Readings from Last 3 Encounters:  05/14/24  124/80  01/30/24 110/80  12/26/23 126/70   Wt Readings from Last 3 Encounters:  05/14/24 188 lb 3.2 oz (85.4 kg)  01/30/24 191 lb 6.4 oz (86.8 kg)  12/26/23 191 lb (86.6 kg)    Physical Exam Constitutional:      General: She is not in acute distress.    Appearance: Normal appearance.  HENT:     Head: Normocephalic.  Cardiovascular:     Rate and Rhythm: Normal rate and regular rhythm.     Heart sounds: Normal heart sounds.  Pulmonary:     Effort: Pulmonary effort is normal.     Breath sounds: Normal breath sounds.  Abdominal:     General: Abdomen is flat. Bowel sounds are normal.     Palpations: Abdomen is soft.     Tenderness: There is abdominal tenderness (mild) in the left lower quadrant.  Skin:    General: Skin is warm and dry.  Neurological:     General: No focal deficit present.     Mental Status: She is alert.  Psychiatric:        Mood and Affect: Mood normal.        Behavior: Behavior normal.      Assessment/Plan: Please see individual problem list.  Left lower quadrant abdominal pain Assessment & Plan: She experiences intermittent left lower quadrant pain with frequent bowel movements. She has a history of diverticulitis. A CT scan of the abdomen and pelvis is ordered for further evaluation. Maintain a liquid and bland diet until scan results are available. If the CT confirms diverticulitis, initiate treatment with Augmentin . If the  CT is clear, refer to gastroenterology for further evaluation. Return precautions given to patient.   Orders: -     CT ABDOMEN PELVIS W CONTRAST; Future  History of diverticulitis -     CT ABDOMEN PELVIS W CONTRAST; Future     Return if symptoms worsen or fail to improve.   Leron Glance, NP-C [facility] [place]    [1]  Social History Tobacco Use  Smoking Status Never   Passive exposure: Never  Smokeless Tobacco Never  [2]  Current Outpatient Medications on File Prior to Visit  Medication Sig  Dispense Refill   acetaminophen  (TYLENOL ) 325 MG tablet Take 2 tablets (650 mg total) by mouth every 6 (six) hours as needed for mild pain (or Fever >/= 101).     amLODipine  (NORVASC ) 5 MG tablet Take 1 tablet (5 mg total) by mouth daily. 100 tablet 2   anastrozole  (ARIMIDEX ) 1 MG tablet TAKE 1 TABLET BY MOUTH DAILY 100 tablet 2   atorvastatin  (LIPITOR) 80 MG tablet Take 1 tablet (80 mg total) by mouth daily. 100 tablet 2   CALCIUM -VITAMIN D  PO Take 1 tablet by mouth daily.      Multiple Vitamins tablet Take 1 tablet by mouth daily.      omeprazole  (PRILOSEC) 20 MG capsule Take 1 capsule (20 mg total) by mouth daily. 100 capsule 2   oxybutynin  (DITROPAN ) 5 MG tablet Take 1 tablet (5 mg total) by mouth 2 (two) times daily. 180 tablet 3   predniSONE  (DELTASONE ) 5 MG tablet Take 1 tablet (5 mg total) by mouth daily as needed. 90 tablet 0   No current facility-administered medications on file prior to visit.   "

## 2024-05-14 NOTE — Assessment & Plan Note (Addendum)
 She experiences intermittent left lower quadrant pain with frequent bowel movements. She has a history of diverticulitis. A CT scan of the abdomen and pelvis is ordered for further evaluation. Maintain a liquid and bland diet until scan results are available. If the CT confirms diverticulitis, initiate treatment with Augmentin . If the CT is clear, refer to gastroenterology for further evaluation. Return precautions given to patient.

## 2024-05-15 ENCOUNTER — Ambulatory Visit: Payer: Self-pay | Admitting: Nurse Practitioner

## 2024-05-15 ENCOUNTER — Encounter: Payer: Self-pay | Admitting: Nurse Practitioner

## 2024-05-15 DIAGNOSIS — R1032 Left lower quadrant pain: Secondary | ICD-10-CM

## 2024-05-15 DIAGNOSIS — Z8719 Personal history of other diseases of the digestive system: Secondary | ICD-10-CM

## 2024-05-19 ENCOUNTER — Ambulatory Visit: Admitting: Occupational Therapy

## 2024-06-02 ENCOUNTER — Ambulatory Visit: Admitting: Occupational Therapy

## 2024-06-03 ENCOUNTER — Ambulatory Visit

## 2024-06-09 ENCOUNTER — Ambulatory Visit: Admitting: Occupational Therapy

## 2024-06-11 ENCOUNTER — Ambulatory Visit: Payer: Medicare Other

## 2024-06-23 ENCOUNTER — Other Ambulatory Visit

## 2024-06-25 ENCOUNTER — Ambulatory Visit: Admitting: Oncology

## 2024-07-02 ENCOUNTER — Ambulatory Visit: Admitting: Oncology

## 2024-07-09 ENCOUNTER — Ambulatory Visit: Admitting: Oncology

## 2024-12-24 ENCOUNTER — Ambulatory Visit: Admitting: Internal Medicine
